# Patient Record
Sex: Female | Born: 1955 | Race: White | Hispanic: No | Marital: Married | Smoking: Never smoker
Health system: Southern US, Community
[De-identification: ages and names within clinical notes are randomized; demographics above are authoritative.]

## PROBLEM LIST (undated history)

## (undated) DIAGNOSIS — F419 Anxiety disorder, unspecified: Secondary | ICD-10-CM

## (undated) DIAGNOSIS — M1990 Unspecified osteoarthritis, unspecified site: Secondary | ICD-10-CM

## (undated) DIAGNOSIS — F32A Depression, unspecified: Secondary | ICD-10-CM

## (undated) DIAGNOSIS — G4700 Insomnia, unspecified: Secondary | ICD-10-CM

## (undated) DIAGNOSIS — J45909 Unspecified asthma, uncomplicated: Secondary | ICD-10-CM

## (undated) DIAGNOSIS — T7840XA Allergy, unspecified, initial encounter: Secondary | ICD-10-CM

## (undated) DIAGNOSIS — H269 Unspecified cataract: Secondary | ICD-10-CM

## (undated) DIAGNOSIS — K219 Gastro-esophageal reflux disease without esophagitis: Secondary | ICD-10-CM

## (undated) DIAGNOSIS — F329 Major depressive disorder, single episode, unspecified: Secondary | ICD-10-CM

## (undated) DIAGNOSIS — R011 Cardiac murmur, unspecified: Secondary | ICD-10-CM

## (undated) DIAGNOSIS — E785 Hyperlipidemia, unspecified: Secondary | ICD-10-CM

## (undated) DIAGNOSIS — IMO0002 Reserved for concepts with insufficient information to code with codable children: Secondary | ICD-10-CM

## (undated) HISTORY — DX: Unspecified osteoarthritis, unspecified site: M19.90

## (undated) HISTORY — DX: Anxiety disorder, unspecified: F41.9

## (undated) HISTORY — DX: Unspecified asthma, uncomplicated: J45.909

## (undated) HISTORY — PX: KNEE ARTHROSCOPY: SUR90

## (undated) HISTORY — DX: Gastro-esophageal reflux disease without esophagitis: K21.9

## (undated) HISTORY — DX: Depression, unspecified: F32.A

## (undated) HISTORY — DX: Hyperlipidemia, unspecified: E78.5

## (undated) HISTORY — PX: PARTIAL HYSTERECTOMY: SHX80

## (undated) HISTORY — DX: Cardiac murmur, unspecified: R01.1

## (undated) HISTORY — PX: ANKLE FRACTURE SURGERY: SHX122

## (undated) HISTORY — DX: Insomnia, unspecified: G47.00

## (undated) HISTORY — PX: CARPAL TUNNEL RELEASE: SHX101

## (undated) HISTORY — PX: FRACTURE SURGERY: SHX138

## (undated) HISTORY — PX: ABDOMINAL HYSTERECTOMY: SHX81

## (undated) HISTORY — PX: COLONOSCOPY: SHX174

## (undated) HISTORY — PX: BREAST LUMPECTOMY: SHX2

## (undated) HISTORY — DX: Reserved for concepts with insufficient information to code with codable children: IMO0002

## (undated) HISTORY — DX: Allergy, unspecified, initial encounter: T78.40XA

## (undated) HISTORY — DX: Unspecified cataract: H26.9

---
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
## 1999-07-22 ENCOUNTER — Other Ambulatory Visit: Admission: RE | Admit: 1999-07-22 | Discharge: 1999-07-22 | Payer: Self-pay | Admitting: Family Medicine

## 1999-09-14 ENCOUNTER — Ambulatory Visit (HOSPITAL_BASED_OUTPATIENT_CLINIC_OR_DEPARTMENT_OTHER): Admission: RE | Admit: 1999-09-14 | Discharge: 1999-09-14 | Payer: Self-pay | Admitting: *Deleted

## 2000-07-23 ENCOUNTER — Other Ambulatory Visit: Admission: RE | Admit: 2000-07-23 | Discharge: 2000-07-23 | Payer: Self-pay | Admitting: Family Medicine

## 2012-06-24 ENCOUNTER — Other Ambulatory Visit: Payer: Self-pay | Admitting: Family Medicine

## 2012-06-25 ENCOUNTER — Other Ambulatory Visit: Payer: Self-pay

## 2012-06-25 MED ORDER — ZOLPIDEM TARTRATE 10 MG PO TABS: 10.0000 mg | ORAL_TABLET | Freq: Every evening | ORAL | Status: DC | PRN

## 2012-06-25 NOTE — Telephone Encounter (Signed)
RX called to [facility]. 

## 2012-06-25 NOTE — Telephone Encounter (Signed)
Please call in Rx for ambien with one refill

## 2012-06-25 NOTE — Telephone Encounter (Signed)
Last seen 05/06/12  Last written 04/08/12 with 2 RF's

## 2012-08-02 ENCOUNTER — Telehealth: Payer: Self-pay | Admitting: Nurse Practitioner

## 2012-08-02 MED ORDER — FLUCONAZOLE 150 MG PO TABS: ORAL_TABLET | ORAL | Status: DC

## 2012-08-02 NOTE — Telephone Encounter (Signed)
Diflucan rx sent to pharmacy.

## 2012-08-02 NOTE — Telephone Encounter (Signed)
Pt aware.

## 2012-08-13 ENCOUNTER — Other Ambulatory Visit: Payer: Self-pay | Admitting: Family Medicine

## 2012-08-14 ENCOUNTER — Other Ambulatory Visit: Payer: Self-pay | Admitting: Family Medicine

## 2012-08-21 ENCOUNTER — Ambulatory Visit: Payer: Self-pay | Admitting: Nurse Practitioner

## 2012-08-23 ENCOUNTER — Encounter: Payer: Self-pay | Admitting: Nurse Practitioner

## 2012-08-23 ENCOUNTER — Ambulatory Visit (INDEPENDENT_AMBULATORY_CARE_PROVIDER_SITE_OTHER): Payer: 59 | Admitting: Nurse Practitioner

## 2012-08-23 VITALS — BP 113/74 | HR 74 | Temp 97.9°F | Ht 66.0 in | Wt 194.0 lb

## 2012-08-23 DIAGNOSIS — Z01419 Encounter for gynecological examination (general) (routine) without abnormal findings: Secondary | ICD-10-CM

## 2012-08-23 DIAGNOSIS — Z0000 Encounter for general adult medical examination without abnormal findings: Secondary | ICD-10-CM

## 2012-08-23 DIAGNOSIS — F32A Depression, unspecified: Secondary | ICD-10-CM

## 2012-08-23 DIAGNOSIS — E785 Hyperlipidemia, unspecified: Secondary | ICD-10-CM

## 2012-08-23 DIAGNOSIS — G4700 Insomnia, unspecified: Secondary | ICD-10-CM | POA: Insufficient documentation

## 2012-08-23 DIAGNOSIS — Z124 Encounter for screening for malignant neoplasm of cervix: Secondary | ICD-10-CM

## 2012-08-23 DIAGNOSIS — K219 Gastro-esophageal reflux disease without esophagitis: Secondary | ICD-10-CM | POA: Insufficient documentation

## 2012-08-23 DIAGNOSIS — F329 Major depressive disorder, single episode, unspecified: Secondary | ICD-10-CM | POA: Insufficient documentation

## 2012-08-23 DIAGNOSIS — F411 Generalized anxiety disorder: Secondary | ICD-10-CM

## 2012-08-23 LAB — COMPLETE METABOLIC PANEL WITH GFR
AST: 17 U/L (ref 0–37)
Albumin: 4.2 g/dL (ref 3.5–5.2)
Alkaline Phosphatase: 61 U/L (ref 39–117)
BUN: 15 mg/dL (ref 6–23)
GFR, Est Non African American: 79 mL/min
Glucose, Bld: 90 mg/dL (ref 70–99)
Potassium: 4.5 mEq/L (ref 3.5–5.3)
Sodium: 138 mEq/L (ref 135–145)
Total Bilirubin: 0.4 mg/dL (ref 0.3–1.2)
Total Protein: 6.9 g/dL (ref 6.0–8.3)

## 2012-08-23 LAB — POCT URINALYSIS DIPSTICK
Bilirubin, UA: NEGATIVE
Ketones, UA: NEGATIVE
Leukocytes, UA: NEGATIVE
pH, UA: 6

## 2012-08-23 LAB — POCT UA - MICROSCOPIC ONLY: WBC number, urine, microscopy: NEGATIVE

## 2012-08-23 LAB — THYROID PANEL WITH TSH
Free Thyroxine Index: 2.5 (ref 1.0–3.9)
T3 Uptake: 34.1 % (ref 22.5–37.0)
T4, Total: 7.3 ug/dL (ref 5.0–12.5)

## 2012-08-23 LAB — POCT CBC
Granulocyte percent: 68.6 %G (ref 37–80)
HCT, POC: 37.9 % (ref 37.7–47.9)
Hemoglobin: 13.5 g/dL (ref 12.2–16.2)
MCH, POC: 31 pg (ref 27–31.2)
MCV: 87.2 fL (ref 80–97)
Platelet Count, POC: 227 10*3/uL (ref 142–424)
RBC: 4.4 M/uL (ref 4.04–5.48)

## 2012-08-23 MED ORDER — LORAZEPAM 1 MG PO TABS: 1.0000 mg | ORAL_TABLET | Freq: Three times a day (TID) | ORAL | Status: DC

## 2012-08-23 MED ORDER — ROSUVASTATIN CALCIUM 40 MG PO TABS: 40.0000 mg | ORAL_TABLET | Freq: Every day | ORAL | Status: DC

## 2012-08-23 MED ORDER — CITALOPRAM HYDROBROMIDE 40 MG PO TABS: 40.0000 mg | ORAL_TABLET | Freq: Every day | ORAL | Status: DC

## 2012-08-23 NOTE — Progress Notes (Signed)
Subjective:    Patient ID: Crystal Wong, female    DOB: October 28, 1955, 57 y.o.   MRN: [id number]  HPI Patient in today for CPE and PAP- SHe is doing well- she has multiple medical problems without complaints. Patient Active Problem List   Diagnosis Date Noted  . Hyperlipidemia 08/23/2012  . Insomnia 08/23/2012  . GERD (gastroesophageal reflux disease) 08/23/2012  . Depression 08/23/2012  . GAD (generalized anxiety disorder) 08/23/2012   Outpatient Encounter Prescriptions as of 08/23/2012  Medication Sig Dispense Refill  . albuterol (PROVENTIL HFA;VENTOLIN HFA) 108 (90 BASE) MCG/ACT inhaler Inhale 2 puffs into the lungs every 6 (six) hours as needed for wheezing.      . budesonide-formoterol (SYMBICORT) 80-4.5 MCG/ACT inhaler Inhale 2 puffs into the lungs 2 (two) times daily.      . citalopram (CELEXA) 40 MG tablet TAKE ONE TABLET BY MOUTH ONE TIME DAILY  30 tablet  1  . CRESTOR 40 MG tablet TAKE ONE TABLET BY MOUTH ONE TIME DAILY  30 tablet  1  . LORazepam (ATIVAN) 1 MG tablet Take 1 mg by mouth every 8 (eight) hours.      . ranitidine (ZANTAC) 150 MG tablet Take 150 mg by mouth 2 (two) times daily.      . [DISCONTINUED] fluconazole (DIFLUCAN) 150 MG tablet 1 PO Now and repeat in 1 week  2 tablet  0  . zolpidem (AMBIEN) 10 MG tablet Take 1 tablet (10 mg total) by mouth at bedtime as needed for sleep.  30 tablet  1  . [DISCONTINUED] CRESTOR 40 MG tablet TAKE ONE TABLET BY MOUTH AT BEDTIME  30 tablet  1   No facility-administered encounter medications on file as of 08/23/2012.       Review of Systems  Constitutional: Negative.   HENT: Negative.   Eyes: Negative.   Respiratory: Negative.   Cardiovascular: Negative.   Gastrointestinal: Negative.   Endocrine: Negative.   Genitourinary: Negative.   Musculoskeletal: Negative.   Allergic/Immunologic: Negative.   Neurological: Negative.   Hematological: Negative.   Psychiatric/Behavioral: Negative.        Objective:   Physical  Exam  Constitutional: She is oriented to person, place, and time. She appears well-developed and well-nourished.  HENT:  Head: Normocephalic.  Right Ear: Hearing, tympanic membrane, external ear and ear canal normal.  Left Ear: Hearing, tympanic membrane, external ear and ear canal normal.  Nose: Nose normal.  Mouth/Throat: Uvula is midline and oropharynx is clear and moist.  Eyes: Conjunctivae and EOM are normal. Pupils are equal, round, and reactive to light.  Neck: Normal range of motion and full passive range of motion without pain. Neck supple. No JVD present. Carotid bruit is not present. No mass and no thyromegaly present.  Cardiovascular: Normal rate, normal heart sounds and intact distal pulses.   No murmur heard. Pulmonary/Chest: Effort normal and breath sounds normal. Right breast exhibits no inverted nipple, no mass, no nipple discharge, no skin change and no tenderness. Left breast exhibits no inverted nipple, no mass, no nipple discharge, no skin change and no tenderness.  Abdominal: Soft. Bowel sounds are normal. She exhibits no mass. There is no tenderness.  Genitourinary: Vagina normal and uterus normal. No breast swelling, tenderness, discharge or bleeding.  bimanual exam-No adnexal masses or tenderness.  Vaginal cuff intact  Musculoskeletal: Normal range of motion.  Lymphadenopathy:    She has no cervical adenopathy.  Neurological: She is alert and oriented to person, place, and time.  Skin: Skin  is warm and dry.  Psychiatric: She has a normal mood and affect. Her behavior is normal. Judgment and thought content normal.   BP 113/74  Pulse 74  Temp(Src) 97.9 F (36.6 C) (Oral)  Ht 5\' 6"  (1.676 m)  Wt 194 lb (87.998 kg)  BMI 31.33 kg/m2        Assessment & Plan:   1. Encounter for routine gynecological examination   2. Hyperlipidemia   3. Insomnia   4. GERD (gastroesophageal reflux disease)   5. Depression   6. GAD (generalized anxiety disorder)   7.  Annual physical exam    Orders Placed This Encounter  Procedures  . COMPLETE METABOLIC PANEL WITH GFR  . NMR Lipoprofile with Lipids  . Thyroid Panel With TSH  . POCT UA - Microscopic Only  . POCT urinalysis dipstick  . POCT CBC   Meds ordered this encounter  Medications  . DISCONTD: LORazepam (ATIVAN) 1 MG tablet    Sig: Take 1 mg by mouth every 8 (eight) hours.  . budesonide-formoterol (SYMBICORT) 80-4.5 MCG/ACT inhaler    Sig: Inhale 2 puffs into the lungs 2 (two) times daily.  Marland Kitchen albuterol (PROVENTIL HFA;VENTOLIN HFA) 108 (90 BASE) MCG/ACT inhaler    Sig: Inhale 2 puffs into the lungs every 6 (six) hours as needed for wheezing.  . ranitidine (ZANTAC) 150 MG tablet    Sig: Take 150 mg by mouth 2 (two) times daily.  . citalopram (CELEXA) 40 MG tablet    Sig: Take 1 tablet (40 mg total) by mouth daily.    Dispense:  30 tablet    Refill:  5    Order Specific Question:  Supervising Provider    Answer:  Ernestina Penna [[id number]]  . rosuvastatin (CRESTOR) 40 MG tablet    Sig: Take 1 tablet (40 mg total) by mouth daily.    Dispense:  30 tablet    Refill:  5    Order Specific Question:  Supervising Provider    Answer:  Ernestina Penna [[id number]]  . LORazepam (ATIVAN) 1 MG tablet    Sig: Take 1 tablet (1 mg total) by mouth every 8 (eight) hours.    Dispense:  30 tablet    Refill:  2    Order Specific Question:  Supervising Provider    Answer:  Deborra Medina   Continue all meds Labs pending Diet and exercise encouraged Follow- up in 3 months  [name], FNP

## 2012-08-23 NOTE — Patient Instructions (Signed)

## 2012-08-24 ENCOUNTER — Encounter: Payer: Self-pay | Admitting: Nurse Practitioner

## 2012-08-26 LAB — NMR LIPOPROFILE WITH LIPIDS
Cholesterol, Total: 225 mg/dL — ABNORMAL HIGH (ref <200)
HDL Size: 9 nm — ABNORMAL LOW (ref >=9.2)
HDL-C: 59 mg/dL (ref >=40)
LDL Particle Number: 2041 nmol/L — ABNORMAL HIGH (ref <1000)
LP-IR Score: <25 (ref <=45)
Large VLDL-P: <0.8 nmol/L (ref <=2.7)
Triglycerides: 51 mg/dL (ref <150)
VLDL Size: 35.6 nm (ref <=46.6)

## 2012-08-26 LAB — PAP IG W/ RFLX HPV ASCU

## 2012-08-28 ENCOUNTER — Telehealth: Payer: Self-pay | Admitting: Nurse Practitioner

## 2012-08-29 NOTE — Telephone Encounter (Signed)
Done

## 2012-09-24 ENCOUNTER — Other Ambulatory Visit: Payer: Self-pay | Admitting: *Deleted

## 2012-09-24 NOTE — Telephone Encounter (Signed)
Patient of MMM. Patient last seen in office on 6-20. Rx last filled on 08-13-12. Please advise. If approved please have nurse phone in to [facility] pharmacy

## 2012-09-25 MED ORDER — ZOLPIDEM TARTRATE 10 MG PO TABS: 10.0000 mg | ORAL_TABLET | Freq: Every evening | ORAL | Status: DC | PRN

## 2012-09-25 NOTE — Telephone Encounter (Signed)
Call in Please

## 2012-09-25 NOTE — Telephone Encounter (Signed)
Med called to pharm 

## 2012-10-15 ENCOUNTER — Other Ambulatory Visit: Payer: Self-pay | Admitting: Nurse Practitioner

## 2012-11-15 ENCOUNTER — Other Ambulatory Visit: Payer: Self-pay | Admitting: *Deleted

## 2012-11-16 MED ORDER — ZOLPIDEM TARTRATE 10 MG PO TABS: 10.0000 mg | ORAL_TABLET | Freq: Every evening | ORAL | Status: DC | PRN

## 2012-11-16 NOTE — Telephone Encounter (Signed)
Please callin rx for ambien with 1 refill

## 2012-11-19 NOTE — Telephone Encounter (Signed)
Called in.

## 2012-12-19 ENCOUNTER — Ambulatory Visit (INDEPENDENT_AMBULATORY_CARE_PROVIDER_SITE_OTHER): Payer: 59 | Admitting: Nurse Practitioner

## 2012-12-19 ENCOUNTER — Encounter: Payer: Self-pay | Admitting: Nurse Practitioner

## 2012-12-19 VITALS — BP 123/80 | HR 83 | Temp 99.4°F | Ht 66.0 in | Wt 198.0 lb

## 2012-12-19 DIAGNOSIS — J029 Acute pharyngitis, unspecified: Secondary | ICD-10-CM

## 2012-12-19 LAB — POCT RAPID STREP A (OFFICE): Rapid Strep A Screen: NEGATIVE

## 2012-12-19 MED ORDER — AMOXICILLIN 875 MG PO TABS: 875.0000 mg | ORAL_TABLET | Freq: Two times a day (BID) | ORAL | Status: DC

## 2012-12-19 MED ORDER — METHYLPREDNISOLONE ACETATE 80 MG/ML IJ SUSP
80.0000 mg | Freq: Once | INTRAMUSCULAR | Status: AC
  Administered 2012-12-19: 80 mg via INTRAMUSCULAR

## 2012-12-19 NOTE — Progress Notes (Signed)
  Subjective:    Patient ID: [name], female    DOB: 30-May-1955, 57 y.o.   MRN: [id number]  HPI Patient here  Today c/o cough, congestion and sore throat. Started about 3 weeks ago.    Review of Systems  Constitutional: Positive for fever (low grade) and chills.  HENT: Positive for ear pain, postnasal drip, rhinorrhea, sinus pressure, sore throat and trouble swallowing.   Respiratory: Positive for cough.   Cardiovascular: Negative.   Gastrointestinal: Negative.        Objective:   Physical Exam  Constitutional: She appears well-developed and well-nourished.  HENT:  Right Ear: Hearing, external ear and ear canal normal. A middle ear effusion is present.  Left Ear: Hearing, external ear and ear canal normal. A middle ear effusion is present.  Nose: Mucosal edema and rhinorrhea present. Right sinus exhibits no maxillary sinus tenderness and no frontal sinus tenderness. Left sinus exhibits no maxillary sinus tenderness and no frontal sinus tenderness.  Mouth/Throat: Uvula is midline, oropharynx is clear and moist and mucous membranes are normal.  Cardiovascular: Normal rate, regular rhythm and normal heart sounds.   Pulmonary/Chest: Effort normal and breath sounds normal.  Skin: Skin is warm.    BP 123/80  Pulse 83  Temp(Src) 99.4 F (37.4 C) (Oral)  Ht 5\' 6"  (1.676 m)  Wt 198 lb (89.812 kg)  BMI 31.97 kg/m2 Results for orders placed in visit on 12/19/12  POCT RAPID STREP A (OFFICE)      Result Value Range   Rapid Strep A Screen Negative  Negative         Assessment & Plan:   1. Sore throat   2. Acute pharyngitis    Meds ordered this encounter  Medications  . amoxicillin (AMOXIL) 875 MG tablet    Sig: Take 1 tablet (875 mg total) by mouth 2 (two) times daily.    Dispense:  20 tablet    Refill:  0    Order Specific Question:  Supervising Provider    Answer:  Ernestina Penna [[id number]]  . methylPREDNISolone acetate (DEPO-MEDROL) injection 80 mg    Sig:    1.  Take meds as prescribed 2. Use a cool mist humidifier especially during the winter months and when heat has  been humid. 3. Use saline nose sprays frequently 4. Saline irrigations of the nose can be very helpful if done frequently.  * 4X daily for 1 week*  * Use of a nettie pot can be helpful with this. Follow directions with this* 5. Drink plenty of fluids 6. Keep thermostat turn down low 7.For any cough or congestion  Use plain Mucinex- regular strength or max strength is fine   * Children- consult with Pharmacist for dosing 8. For fever or aces or pains- take tylenol or ibuprofen appropriate for age and weight.  * for fevers greater than 101 orally you may alternate ibuprofen and tylenol every  3 hours.   [name], FNP

## 2012-12-19 NOTE — Patient Instructions (Signed)

## 2012-12-20 ENCOUNTER — Other Ambulatory Visit: Payer: Self-pay | Admitting: Nurse Practitioner

## 2012-12-23 ENCOUNTER — Ambulatory Visit: Payer: 59

## 2012-12-23 NOTE — Telephone Encounter (Signed)
Last seen 12/19/12

## 2012-12-30 ENCOUNTER — Ambulatory Visit (INDEPENDENT_AMBULATORY_CARE_PROVIDER_SITE_OTHER): Payer: 59

## 2012-12-30 DIAGNOSIS — Z23 Encounter for immunization: Secondary | ICD-10-CM

## 2013-01-28 ENCOUNTER — Telehealth: Payer: Self-pay | Admitting: Nurse Practitioner

## 2013-01-28 MED ORDER — AZITHROMYCIN 250 MG PO TABS: ORAL_TABLET | ORAL | Status: DC

## 2013-01-28 NOTE — Telephone Encounter (Signed)
rx sent to pharmacy

## 2013-02-04 ENCOUNTER — Other Ambulatory Visit: Payer: Self-pay

## 2013-02-04 DIAGNOSIS — F411 Generalized anxiety disorder: Secondary | ICD-10-CM

## 2013-02-04 MED ORDER — LORAZEPAM 1 MG PO TABS: 1.0000 mg | ORAL_TABLET | Freq: Three times a day (TID) | ORAL | Status: DC

## 2013-02-04 NOTE — Telephone Encounter (Signed)
Rx called into [facility] 

## 2013-02-04 NOTE — Telephone Encounter (Signed)
Please call in ativan rx 

## 2013-02-04 NOTE — Telephone Encounter (Signed)
Last seen 12/19/12  MMM if approved route to nurse to phone into [facility]

## 2013-02-05 ENCOUNTER — Telehealth: Payer: Self-pay | Admitting: Nurse Practitioner

## 2013-02-05 ENCOUNTER — Encounter: Payer: Self-pay | Admitting: General Practice

## 2013-02-05 ENCOUNTER — Ambulatory Visit (INDEPENDENT_AMBULATORY_CARE_PROVIDER_SITE_OTHER): Payer: 59 | Admitting: General Practice

## 2013-02-05 VITALS — BP 123/79 | HR 79 | Temp 98.4°F | Ht 66.0 in | Wt 193.5 lb

## 2013-02-05 DIAGNOSIS — J209 Acute bronchitis, unspecified: Secondary | ICD-10-CM

## 2013-02-05 DIAGNOSIS — J069 Acute upper respiratory infection, unspecified: Secondary | ICD-10-CM

## 2013-02-05 DIAGNOSIS — B373 Candidiasis of vulva and vagina: Secondary | ICD-10-CM

## 2013-02-05 MED ORDER — PREDNISONE (PAK) 10 MG PO TABS: ORAL_TABLET | ORAL | Status: DC

## 2013-02-05 MED ORDER — FLUCONAZOLE 150 MG PO TABS: 150.0000 mg | ORAL_TABLET | Freq: Once | ORAL | Status: DC

## 2013-02-05 MED ORDER — AMOXICILLIN-POT CLAVULANATE 875-125 MG PO TABS: 1.0000 | ORAL_TABLET | Freq: Two times a day (BID) | ORAL | Status: DC

## 2013-02-05 NOTE — Patient Instructions (Addendum)
Bronchitis Bronchitis is the body's way of reacting to injury and/or infection (inflammation) of the bronchi. Bronchi are the air tubes that extend from the windpipe into the lungs. If the inflammation becomes severe, it may cause shortness of breath. CAUSES  Inflammation may be caused by:  A virus.  Germs (bacteria).  Dust.  Allergens.  Pollutants and many other irritants. The cells lining the bronchial tree are covered with tiny hairs (cilia). These constantly beat upward, away from the lungs, toward the mouth. This keeps the lungs free of pollutants. When these cells become too irritated and are unable to do their job, mucus begins to develop. This causes the characteristic cough of bronchitis. The cough clears the lungs when the cilia are unable to do their job. Without either of these protective mechanisms, the mucus would settle in the lungs. Then you would develop pneumonia. Smoking is a common cause of bronchitis and can contribute to pneumonia. Stopping this habit is the single most important thing you can do to help yourself. TREATMENT   Your caregiver may prescribe an antibiotic if the cough is caused by bacteria. Also, medicines that open up your airways make it easier to breathe. Your caregiver may also recommend or prescribe an expectorant. It will loosen the mucus to be coughed up. Only take over-the-counter or prescription medicines for pain, discomfort, or fever as directed by your caregiver.  Removing whatever causes the problem (smoking, for example) is critical to preventing the problem from getting worse.  Cough suppressants may be prescribed for relief of cough symptoms.  Inhaled medicines may be prescribed to help with symptoms now and to help prevent problems from returning.  For those with recurrent (chronic) bronchitis, there may be a need for steroid medicines. SEEK IMMEDIATE MEDICAL CARE IF:   During treatment, you develop more pus-like mucus (purulent  sputum).  You have a fever.  You become progressively more ill.  You have increased difficulty breathing, wheezing, or shortness of breath. It is necessary to seek immediate medical care if you are elderly or sick from any other disease. MAKE SURE YOU:   Understand these instructions.  Will watch your condition.  Will get help right away if you are not doing well or get worse. Document Released: 02/20/2005 Document Revised: 10/23/2012 Document Reviewed: 10/15/2012 ExitCare Patient Information 2014 [employer].  Upper Respiratory Infection, Adult An upper respiratory infection (URI) is also sometimes known as the common cold. The upper respiratory tract includes the nose, sinuses, throat, trachea, and bronchi. Bronchi are the airways leading to the lungs. Most people improve within 1 week, but symptoms can last up to 2 weeks. A residual cough may last even longer.  CAUSES Many different viruses can infect the tissues lining the upper respiratory tract. The tissues become irritated and inflamed and often become very moist. Mucus production is also common. A cold is contagious. You can easily spread the virus to others by oral contact. This includes kissing, sharing a glass, coughing, or sneezing. Touching your mouth or nose and then touching a surface, which is then touched by another person, can also spread the virus. SYMPTOMS  Symptoms typically develop 1 to 3 days after you come in contact with a cold virus. Symptoms vary from person to person. They may include:  Runny nose.  Sneezing.  Nasal congestion.  Sinus irritation.  Sore throat.  Loss of voice (laryngitis).  Cough.  Fatigue.  Muscle aches.  Loss of appetite.  Headache.  Low-grade fever. DIAGNOSIS  You   might diagnose your own cold based on familiar symptoms, since most people get a cold 2 to 3 times a year. Your caregiver can confirm this based on your exam. Most importantly, your caregiver can check that  your symptoms are not due to another disease such as strep throat, sinusitis, pneumonia, asthma, or epiglottitis. Blood tests, throat tests, and X-rays are not necessary to diagnose a common cold, but they may sometimes be helpful in excluding other more serious diseases. Your caregiver will decide if any further tests are required. RISKS AND COMPLICATIONS  You may be at risk for a more severe case of the common cold if you smoke cigarettes, have chronic heart disease (such as heart failure) or lung disease (such as asthma), or if you have a weakened immune system. The very young and very old are also at risk for more serious infections. Bacterial sinusitis, middle ear infections, and bacterial pneumonia can complicate the common cold. The common cold can worsen asthma and chronic obstructive pulmonary disease (COPD). Sometimes, these complications can require emergency medical care and may be life-threatening. PREVENTION  The best way to protect against getting a cold is to practice good hygiene. Avoid oral or hand contact with people with cold symptoms. Wash your hands often if contact occurs. There is no clear evidence that vitamin C, vitamin E, echinacea, or exercise reduces the chance of developing a cold. However, it is always recommended to get plenty of rest and practice good nutrition. TREATMENT  Treatment is directed at relieving symptoms. There is no cure. Antibiotics are not effective, because the infection is caused by a virus, not by bacteria. Treatment may include:  Increased fluid intake. Sports drinks offer valuable electrolytes, sugars, and fluids.  Breathing heated mist or steam (vaporizer or shower).  Eating chicken soup or other clear broths, and maintaining good nutrition.  Getting plenty of rest.  Using gargles or lozenges for comfort.  Controlling fevers with ibuprofen or acetaminophen as directed by your caregiver.  Increasing usage of your inhaler if you have  asthma. Zinc gel and zinc lozenges, taken in the first 24 hours of the common cold, can shorten the duration and lessen the severity of symptoms. Pain medicines may help with fever, muscle aches, and throat pain. A variety of non-prescription medicines are available to treat congestion and runny nose. Your caregiver can make recommendations and may suggest nasal or lung inhalers for other symptoms.  HOME CARE INSTRUCTIONS   Only take over-the-counter or prescription medicines for pain, discomfort, or fever as directed by your caregiver.  Use a warm mist humidifier or inhale steam from a shower to increase air moisture. This may keep secretions moist and make it easier to breathe.  Drink enough water and fluids to keep your urine clear or pale yellow.  Rest as needed.  Return to work when your temperature has returned to normal or as your caregiver advises. You may need to stay home longer to avoid infecting others. You can also use a face mask and careful hand washing to prevent spread of the virus. SEEK MEDICAL CARE IF:   After the first few days, you feel you are getting worse rather than better.  You need your caregiver's advice about medicines to control symptoms.  You develop chills, worsening shortness of breath, or brown or red sputum. These may be signs of pneumonia.  You develop yellow or brown nasal discharge or pain in the face, especially when you bend forward. These may be signs of sinusitis.    You develop a fever, swollen neck glands, pain with swallowing, or white areas in the back of your throat. These may be signs of strep throat. SEEK IMMEDIATE MEDICAL CARE IF:   You have a fever.  You develop severe or persistent headache, ear pain, sinus pain, or chest pain.  You develop wheezing, a prolonged cough, cough up blood, or have a change in your usual mucus (if you have chronic lung disease).  You develop sore muscles or a stiff neck. Document Released: 08/16/2000  Document Revised: 05/15/2011 Document Reviewed: 06/24/2010 ExitCare Patient Information 2014 [employer].  

## 2013-02-05 NOTE — Telephone Encounter (Signed)
appt with Crystal Wong at 9:40

## 2013-02-05 NOTE — Progress Notes (Signed)
   Subjective:    Patient ID: Crystal Wong, female    DOB: 01-Mar-1956, 57 y.o.   MRN: [id number]  Cough This is a new problem. The current episode started 1 to 4 weeks ago (onset 2 weeks ago). The problem has been gradually worsening. The problem occurs every few minutes. The cough is non-productive. Associated symptoms include postnasal drip, shortness of breath and wheezing. Pertinent negatives include no chills, ear congestion, fever, headaches or sore throat. The symptoms are aggravated by lying down. She has tried steroid inhaler for the symptoms. Her past medical history is significant for asthma and bronchitis. There is no history of pneumonia.      Review of Systems  Constitutional: Negative for fever and chills.  HENT: Positive for postnasal drip. Negative for sinus pressure and sore throat.   Respiratory: Positive for cough, shortness of breath and wheezing. Negative for chest tightness.   Neurological: Negative for dizziness, weakness and headaches.       Objective:   Physical Exam  Constitutional: She is oriented to person, place, and time. She appears well-developed and well-nourished.  Cardiovascular: Normal rate, regular rhythm and normal heart sounds.   Pulmonary/Chest: Effort normal. No respiratory distress. She has wheezes in the right upper field and the left upper field. She exhibits no tenderness.  Bronchial cough and tight breath sounds throughout  Neurological: She is alert and oriented to person, place, and time.  Skin: Skin is warm and dry.  Psychiatric: She has a normal mood and affect.          Assessment & Plan:  1. Upper respiratory infection  - amoxicillin-clavulanate (AUGMENTIN) 875-125 MG per tablet; Take 1 tablet by mouth 2 (two) times daily.  Dispense: 20 tablet; Refill: 0  2. Acute bronchitis  - predniSONE (STERAPRED UNI-PAK) 10 MG tablet; Take as directed  Dispense: 21 tablet; Refill: 0 -adequate fluids -RTO if symptoms worsen or  unresolved -Patient verbalized understanding Coralie Keens, FNP-C

## 2013-02-10 ENCOUNTER — Other Ambulatory Visit: Payer: Self-pay | Admitting: Nurse Practitioner

## 2013-02-19 ENCOUNTER — Telehealth: Payer: Self-pay | Admitting: *Deleted

## 2013-02-19 NOTE — Telephone Encounter (Signed)
Ins co will not cover  symbicort unless  Pt has tried and failed  advair and  Dulera. [place]bour said she had tried advair in the past but not dulera.  I told her you might give her a sample of dulera, that we will let her know.  She is very willing to try something else.  They said if the dulera and advair did not work submit symbicort again and they would cover.  Can you handle this for me?  Thanks!

## 2013-02-24 ENCOUNTER — Other Ambulatory Visit: Payer: Self-pay | Admitting: Nurse Practitioner

## 2013-02-24 NOTE — Telephone Encounter (Signed)
Mae said to try [facility] 167mcg/5mcg 2 puffs bid and to let us know if it helps or if you have any unusual side effects.. Samples up front and husband notified and will pick up.

## 2013-02-25 ENCOUNTER — Other Ambulatory Visit: Payer: Self-pay

## 2013-02-25 NOTE — Telephone Encounter (Signed)
Last seen 02/05/13  Mae  If approved route to nurse to call into  [facility] 

## 2013-02-26 MED ORDER — ZOLPIDEM TARTRATE 10 MG PO TABS: 10.0000 mg | ORAL_TABLET | Freq: Every evening | ORAL | Status: DC | PRN

## 2013-02-26 NOTE — Telephone Encounter (Signed)
Ambien called to [facility] and pt aware.

## 2013-02-26 NOTE — Telephone Encounter (Signed)
Please phone in

## 2013-03-07 ENCOUNTER — Other Ambulatory Visit: Payer: Self-pay | Admitting: Family Medicine

## 2013-03-07 DIAGNOSIS — Z20828 Contact with and (suspected) exposure to other viral communicable diseases: Secondary | ICD-10-CM

## 2013-03-07 MED ORDER — OSELTAMIVIR PHOSPHATE 75 MG PO CAPS: 75.0000 mg | ORAL_CAPSULE | Freq: Every day | ORAL | Status: DC

## 2013-03-31 ENCOUNTER — Other Ambulatory Visit: Payer: Self-pay | Admitting: *Deleted

## 2013-03-31 ENCOUNTER — Other Ambulatory Visit: Payer: Self-pay | Admitting: General Practice

## 2013-03-31 DIAGNOSIS — F411 Generalized anxiety disorder: Secondary | ICD-10-CM

## 2013-03-31 NOTE — Telephone Encounter (Signed)
Patient last seen in office on 02-05-13. Rx last filled on 02-04-13. Please advise. If approved please route to Pool B so nurse can phone in to [place]

## 2013-04-02 ENCOUNTER — Other Ambulatory Visit: Payer: Self-pay | Admitting: General Practice

## 2013-04-07 ENCOUNTER — Telehealth: Payer: Self-pay | Admitting: Nurse Practitioner

## 2013-04-07 ENCOUNTER — Other Ambulatory Visit: Payer: Self-pay

## 2013-04-07 DIAGNOSIS — F411 Generalized anxiety disorder: Secondary | ICD-10-CM

## 2013-04-07 NOTE — Telephone Encounter (Signed)
Last seen 02/05/13  Crystal Wong  If approved route to nurse to call into [place]

## 2013-04-08 MED ORDER — LORAZEPAM 1 MG PO TABS: 1.0000 mg | ORAL_TABLET | Freq: Three times a day (TID) | ORAL | Status: DC

## 2013-04-08 NOTE — Telephone Encounter (Signed)
patient aware called into pharmacy

## 2013-04-08 NOTE — Telephone Encounter (Signed)
Please call in ativan 1mg  prn #30 1 refill

## 2013-04-11 MED ORDER — LORAZEPAM 1 MG PO TABS: 1.0000 mg | ORAL_TABLET | Freq: Two times a day (BID) | ORAL | Status: DC | PRN

## 2013-04-11 NOTE — Telephone Encounter (Signed)
Called to [facility] 

## 2013-04-11 NOTE — Telephone Encounter (Signed)
Please phone in

## 2013-04-21 ENCOUNTER — Other Ambulatory Visit: Payer: Self-pay | Admitting: Nurse Practitioner

## 2013-04-22 NOTE — Telephone Encounter (Signed)
Last seen 06/14

## 2013-04-23 NOTE — Telephone Encounter (Signed)
ntbs

## 2013-05-27 ENCOUNTER — Other Ambulatory Visit: Payer: Self-pay | Admitting: General Practice

## 2013-06-09 ENCOUNTER — Other Ambulatory Visit: Payer: Self-pay | Admitting: General Practice

## 2013-06-10 NOTE — Telephone Encounter (Signed)
Patient last seen i noffice on 02-05-13 for an acute visit. Rx last filled on 04-19-13. Please advise. If approved please route to Pool B so nurse can phone in to pharmacy

## 2013-06-11 ENCOUNTER — Other Ambulatory Visit: Payer: Self-pay | Admitting: General Practice

## 2013-06-11 DIAGNOSIS — G4700 Insomnia, unspecified: Secondary | ICD-10-CM

## 2013-06-11 MED ORDER — ZOLPIDEM TARTRATE 10 MG PO TABS: 10.0000 mg | ORAL_TABLET | Freq: Every evening | ORAL | Status: DC | PRN

## 2013-06-12 ENCOUNTER — Other Ambulatory Visit: Payer: Self-pay | Admitting: General Practice

## 2013-06-12 NOTE — Telephone Encounter (Signed)
Patient last seen in office on 02-05-13. Rx last filled on 04-19-13. Please Advise. If approved please route to Pool B so nurse can phone in to pharmacy

## 2013-06-12 NOTE — Telephone Encounter (Signed)
Scripted printed and signed on 06/11/13 for patient to pick up. Please notify.

## 2013-06-12 NOTE — Telephone Encounter (Signed)
Ambien script ready.

## 2013-07-24 ENCOUNTER — Other Ambulatory Visit: Payer: Self-pay | Admitting: Nurse Practitioner

## 2013-07-25 NOTE — Telephone Encounter (Signed)
Last seen 02/05/13  Mae  If approved route to nurse to call into  [place]

## 2013-07-29 ENCOUNTER — Other Ambulatory Visit: Payer: Self-pay | Admitting: Family Medicine

## 2013-07-31 ENCOUNTER — Telehealth: Payer: Self-pay | Admitting: Nurse Practitioner

## 2013-08-01 ENCOUNTER — Other Ambulatory Visit: Payer: Self-pay | Admitting: Nurse Practitioner

## 2013-08-18 ENCOUNTER — Ambulatory Visit (INDEPENDENT_AMBULATORY_CARE_PROVIDER_SITE_OTHER): Payer: 59 | Admitting: Nurse Practitioner

## 2013-08-18 ENCOUNTER — Encounter: Payer: Self-pay | Admitting: Nurse Practitioner

## 2013-08-18 VITALS — BP 129/84 | HR 79 | Temp 98.2°F | Ht 66.0 in | Wt 205.6 lb

## 2013-08-18 DIAGNOSIS — L678 Other hair color and hair shaft abnormalities: Secondary | ICD-10-CM

## 2013-08-18 DIAGNOSIS — L738 Other specified follicular disorders: Secondary | ICD-10-CM

## 2013-08-18 DIAGNOSIS — L739 Follicular disorder, unspecified: Secondary | ICD-10-CM

## 2013-08-18 MED ORDER — ROSUVASTATIN CALCIUM 40 MG PO TABS: ORAL_TABLET | ORAL | Status: DC

## 2013-08-18 MED ORDER — AMOXICILLIN 875 MG PO TABS: 875.0000 mg | ORAL_TABLET | Freq: Two times a day (BID) | ORAL | Status: DC

## 2013-08-18 MED ORDER — FLUCONAZOLE 150 MG PO TABS: ORAL_TABLET | ORAL | Status: DC

## 2013-08-18 NOTE — Progress Notes (Signed)
   Subjective:    Patient ID: Crystal Wong, female    DOB: 01-27-1956, 58 y.o.   MRN: [id number]  HPI Patient in c/o discoloration in groin area- with bump- Noticed it 2 weeks ago and has gotten bigger- Sore to the touch- slight drainage- Patient huwband tried to pop it and all he got out was blood.    Review of Systems  Constitutional: Negative.   HENT: Negative.   Respiratory: Negative.   Cardiovascular: Negative.   Genitourinary: Negative.   Psychiatric/Behavioral: Negative.   All other systems reviewed and are negative.      Objective:   Physical Exam  Constitutional: She appears well-developed and well-nourished.  Cardiovascular: Normal rate and normal heart sounds.   Pulmonary/Chest: Effort normal and breath sounds normal.  Skin: Skin is warm.  2cm erythematous papular lesion right upper groin area.   BP 129/84  Pulse 79  Temp(Src) 98.2 F (36.8 C) (Oral)  Ht 5\' 6"  (1.676 m)  Wt 205 lb 9.6 oz (93.26 kg)  BMI 33.20 kg/m2        Assessment & Plan:   1. Folliculitis    Meds ordered this encounter  Medications  . amoxicillin (AMOXIL) 875 MG tablet    Sig: Take 1 tablet (875 mg total) by mouth 2 (two) times daily.    Dispense:  20 tablet    Refill:  0    Order Specific Question:  Supervising Provider    Answer:  Chipper Herb [[id number]]  . rosuvastatin (CRESTOR) 40 MG tablet    Sig: TAKE ONE TABLET BY MOUTH ONE TIME DAILY    Dispense:  30 tablet    Refill:  2    Order Specific Question:  Supervising Provider    Answer:  Chipper Herb [[id number]]  . fluconazole (DIFLUCAN) 150 MG tablet    Sig: 1 po now and repeat in 1 week    Dispense:  1 tablet    Refill:  0    Order Specific Question:  Supervising Provider    Answer:  Chipper Herb [[id number]]   Warm compresses D not pick or scratch  [name] Done, FNP

## 2013-08-18 NOTE — Patient Instructions (Signed)
Folliculitis  Folliculitis is redness, soreness, and swelling (inflammation) of the hair follicles. This condition can occur anywhere on the body. People with weakened immune systems, diabetes, or obesity have a greater risk of getting folliculitis. CAUSES  Bacterial infection. This is the most common cause.  Fungal infection.  Viral infection.  Contact with certain chemicals, especially oils and tars. Long-term folliculitis can result from bacteria that live in the nostrils. The bacteria may trigger multiple outbreaks of folliculitis over time. SYMPTOMS Folliculitis most commonly occurs on the scalp, thighs, legs, back, buttocks, and areas where hair is shaved frequently. An early sign of folliculitis is a small, white or yellow, pus-filled, itchy lesion (pustule). These lesions appear on a red, inflamed follicle. They are usually less than 0.2 inches (5 mm) wide. When there is an infection of the follicle that goes deeper, it becomes a boil or furuncle. A group of closely packed boils creates a larger lesion (carbuncle). Carbuncles tend to occur in hairy, sweaty areas of the body. DIAGNOSIS  Your caregiver can usually tell what is wrong by doing a physical exam. A sample may be taken from one of the lesions and tested in a lab. This can help determine what is causing your folliculitis. TREATMENT  Treatment may include:  Applying warm compresses to the affected areas.  Taking antibiotic medicines orally or applying them to the skin.  Draining the lesions if they contain a large amount of pus or fluid.  Laser hair removal for cases of long-lasting folliculitis. This helps to prevent regrowth of the hair. HOME CARE INSTRUCTIONS  Apply warm compresses to the affected areas as directed by your caregiver.  If antibiotics are prescribed, take them as directed. Finish them even if you start to feel better.  You may take over-the-counter medicines to relieve itching.  Do not shave  irritated skin.  Follow up with your caregiver as directed. SEEK IMMEDIATE MEDICAL CARE IF:   You have increasing redness, swelling, or pain in the affected area.  You have a fever. MAKE SURE YOU:  Understand these instructions.  Will watch your condition.  Will get help right away if you are not doing well or get worse. Document Released: 05/01/2001 Document Revised: 08/22/2011 Document Reviewed: 05/23/2011 Exit[employer] Patient Information 2014 [employer].  

## 2013-08-27 ENCOUNTER — Other Ambulatory Visit: Payer: Self-pay | Admitting: Nurse Practitioner

## 2013-09-22 ENCOUNTER — Other Ambulatory Visit: Payer: Self-pay | Admitting: General Practice

## 2013-09-22 NOTE — Telephone Encounter (Signed)
Patient last seen in office on 08-18-13. Rx last filled on 07-30-13 for #30. Please advise. If approved please route to Pool B so nurse can phone in to pharmacy

## 2013-09-22 NOTE — Telephone Encounter (Addendum)
Please call in ambien with 1 refills 

## 2013-09-23 NOTE — Telephone Encounter (Signed)
Called in.

## 2013-10-09 ENCOUNTER — Other Ambulatory Visit: Payer: Self-pay | Admitting: Nurse Practitioner

## 2013-10-10 NOTE — Telephone Encounter (Signed)
Last seen 08/18/13  MMM  If approved route to nurse to call into [facility]

## 2013-10-11 NOTE — Telephone Encounter (Signed)
Please call in ativan with 1 refills 

## 2013-10-11 NOTE — Telephone Encounter (Signed)
Called in.

## 2013-10-31 ENCOUNTER — Other Ambulatory Visit: Payer: Self-pay

## 2013-10-31 MED ORDER — CITALOPRAM HYDROBROMIDE 40 MG PO TABS: ORAL_TABLET | ORAL | Status: DC

## 2013-11-09 ENCOUNTER — Other Ambulatory Visit: Payer: Self-pay | Admitting: *Deleted

## 2013-11-09 NOTE — Telephone Encounter (Signed)
Last filled 09/23/13, last seen 08/18/13. Nurse call in to [facility]

## 2013-11-11 MED ORDER — ZOLPIDEM TARTRATE 10 MG PO TABS: ORAL_TABLET | ORAL | Status: DC

## 2013-11-11 NOTE — Telephone Encounter (Signed)
Please call in ambien with 1 refills 

## 2013-11-11 NOTE — Telephone Encounter (Signed)
rx called into pharmacy

## 2013-11-19 ENCOUNTER — Encounter: Payer: Self-pay | Admitting: Nurse Practitioner

## 2013-11-19 ENCOUNTER — Ambulatory Visit (INDEPENDENT_AMBULATORY_CARE_PROVIDER_SITE_OTHER): Payer: No Typology Code available for payment source | Admitting: Nurse Practitioner

## 2013-11-19 ENCOUNTER — Telehealth: Payer: Self-pay | Admitting: Nurse Practitioner

## 2013-11-19 VITALS — BP 123/83 | HR 76 | Temp 99.9°F | Ht 66.0 in | Wt 202.8 lb

## 2013-11-19 DIAGNOSIS — J0100 Acute maxillary sinusitis, unspecified: Secondary | ICD-10-CM

## 2013-11-19 MED ORDER — AMOXICILLIN 875 MG PO TABS: 875.0000 mg | ORAL_TABLET | Freq: Two times a day (BID) | ORAL | Status: DC

## 2013-11-19 MED ORDER — CHLORPHEN-PE-ACETAMINOPHEN 4-10-325 MG PO TABS: 1.0000 | ORAL_TABLET | Freq: Four times a day (QID) | ORAL | Status: DC | PRN

## 2013-11-19 NOTE — Telephone Encounter (Signed)
appt scheduled

## 2013-11-19 NOTE — Patient Instructions (Signed)

## 2013-11-19 NOTE — Progress Notes (Signed)
Subjective:    Patient ID: Crystal Wong, female    DOB: [birth year], 58 y.o.   MRN: [id number]  URI  This is a new problem. The current episode started in the past 7 days. The problem has been unchanged. The maximum temperature recorded prior to her arrival was 101 - 101.9 F. The fever has been present for 1 to 2 days. Associated symptoms include coughing, ear pain, rhinorrhea, sinus pain and sneezing. Pertinent negatives include no neck pain. Treatments tried: advil congestion, mucinex.  The treatment provided mild relief.  Otalgia  There is pain in both ears. This is a new problem. The current episode started in the past 7 days. The problem occurs constantly. The problem has been unchanged. The maximum temperature recorded prior to her arrival was 101 - 101.9 F. The fever has been present for 1 to 2 days. Pain severity now: pressure.  Associated symptoms include coughing, hearing loss (bil ear. ) and rhinorrhea. Pertinent negatives include no ear discharge or neck pain. She has tried nothing for the symptoms. There is no history of a chronic ear infection, hearing loss or a tympanostomy tube.      Review of Systems  Constitutional: Negative.   HENT: Positive for ear pain, hearing loss (bil ear. ), rhinorrhea and sneezing. Negative for ear discharge.   Eyes: Negative.   Respiratory: Positive for cough.   Cardiovascular: Negative.   Gastrointestinal: Negative.   Endocrine: Negative.   Genitourinary: Negative.   Musculoskeletal: Negative.  Negative for neck pain.  Skin: Negative.   Allergic/Immunologic: Negative.   Neurological: Negative.   Hematological: Negative.   Psychiatric/Behavioral: Negative.        Objective:   Physical Exam  Constitutional: She is oriented to person, place, and time. She appears well-developed and well-nourished.  HENT:  Head: Normocephalic and atraumatic.  Eyes: Conjunctivae are normal. Pupils are equal, round, and reactive to light.  Neck: Normal  range of motion. Neck supple.  Cardiovascular: Normal rate and regular rhythm.   Pulmonary/Chest: Effort normal.  Musculoskeletal: Normal range of motion.  Neurological: She is alert and oriented to person, place, and time.  Skin: Skin is warm and dry.  Psychiatric: She has a normal mood and affect.    BP 123/83  Pulse 76  Temp(Src) 99.9 F (37.7 C) (Oral)  Ht 5\' 6"  (1.676 m)  Wt 202 lb 12.8 oz (91.989 kg)  BMI 32.75 kg/m2       Assessment & Plan:   1. Acute maxillary sinusitis, recurrence not specified    Meds ordered this encounter  Medications  . amoxicillin (AMOXIL) 875 MG tablet    Sig: Take 1 tablet (875 mg total) by mouth 2 (two) times daily.    Dispense:  20 tablet    Refill:  0    Order Specific Question:  Supervising Provider    Answer:  Chipper Herb [[id number]]  . Chlorphen-PE-Acetaminophen 4-10-325 MG TABS    Sig: Take 1 tablet by mouth every 6 (six) hours as needed.    Dispense:  30 tablet    Refill:  0    Order Specific Question:  Supervising Provider    Answer:  Chipper Herb [[id number]]   1. Take meds as prescribed 2. Use a cool mist humidifier especially during the winter months and when heat has been humid. 3. Use saline nose sprays frequently 4. Saline irrigations of the nose can be very helpful if done frequently.  * 4X daily for 1 week*  *  Use of a nettie pot can be helpful with this. Follow directions with this* 5. Drink plenty of fluids 6. Keep thermostat turn down low 7.For any cough or congestion  Use plain Mucinex- regular strength or max strength is fine   * Children- consult with Pharmacist for dosing 8. For fever or aces or pains- take tylenol or ibuprofen appropriate for age and weight.  * for fevers greater than 101 orally you may alternate ibuprofen and tylenol every  3 hours.   [name] Done, FNP

## 2013-11-24 ENCOUNTER — Other Ambulatory Visit: Payer: Self-pay | Admitting: *Deleted

## 2013-11-24 MED ORDER — FLUCONAZOLE 150 MG PO TABS: 150.0000 mg | ORAL_TABLET | Freq: Once | ORAL | Status: DC

## 2013-11-27 ENCOUNTER — Telehealth: Payer: Self-pay | Admitting: Nurse Practitioner

## 2013-11-27 MED ORDER — FLUCONAZOLE 150 MG PO TABS: 150.0000 mg | ORAL_TABLET | Freq: Once | ORAL | Status: DC

## 2013-11-27 NOTE — Telephone Encounter (Signed)
Patient aware.

## 2013-11-27 NOTE — Telephone Encounter (Signed)
Finish antibiotic before taking another diflucan

## 2013-11-28 ENCOUNTER — Other Ambulatory Visit: Payer: Self-pay | Admitting: *Deleted

## 2013-11-28 MED ORDER — CITALOPRAM HYDROBROMIDE 40 MG PO TABS: ORAL_TABLET | ORAL | Status: DC

## 2013-12-18 ENCOUNTER — Other Ambulatory Visit: Payer: Self-pay | Admitting: Nurse Practitioner

## 2013-12-20 NOTE — Telephone Encounter (Signed)
Patient last seen in office on 11-19-13 for an acute visit. Rx last filled on 10-11-13 for #30. Please advise.

## 2013-12-21 NOTE — Telephone Encounter (Signed)
Please call in ativan with 1 refills 

## 2013-12-22 NOTE — Telephone Encounter (Signed)
Left authorization on voicemail 

## 2013-12-23 ENCOUNTER — Ambulatory Visit (INDEPENDENT_AMBULATORY_CARE_PROVIDER_SITE_OTHER): Payer: No Typology Code available for payment source | Admitting: Nurse Practitioner

## 2013-12-23 ENCOUNTER — Encounter: Payer: Self-pay | Admitting: Nurse Practitioner

## 2013-12-23 ENCOUNTER — Ambulatory Visit (INDEPENDENT_AMBULATORY_CARE_PROVIDER_SITE_OTHER): Payer: No Typology Code available for payment source

## 2013-12-23 VITALS — BP 122/74 | HR 73 | Temp 97.8°F | Ht 66.5 in | Wt 203.4 lb

## 2013-12-23 DIAGNOSIS — Z01419 Encounter for gynecological examination (general) (routine) without abnormal findings: Secondary | ICD-10-CM

## 2013-12-23 DIAGNOSIS — Z0000 Encounter for general adult medical examination without abnormal findings: Secondary | ICD-10-CM

## 2013-12-23 DIAGNOSIS — L918 Other hypertrophic disorders of the skin: Secondary | ICD-10-CM

## 2013-12-23 DIAGNOSIS — Z7722 Contact with and (suspected) exposure to environmental tobacco smoke (acute) (chronic): Secondary | ICD-10-CM

## 2013-12-23 DIAGNOSIS — F32A Depression, unspecified: Secondary | ICD-10-CM

## 2013-12-23 DIAGNOSIS — E785 Hyperlipidemia, unspecified: Secondary | ICD-10-CM

## 2013-12-23 DIAGNOSIS — J4521 Mild intermittent asthma with (acute) exacerbation: Secondary | ICD-10-CM

## 2013-12-23 DIAGNOSIS — K219 Gastro-esophageal reflux disease without esophagitis: Secondary | ICD-10-CM

## 2013-12-23 DIAGNOSIS — G4700 Insomnia, unspecified: Secondary | ICD-10-CM

## 2013-12-23 DIAGNOSIS — F411 Generalized anxiety disorder: Secondary | ICD-10-CM

## 2013-12-23 DIAGNOSIS — F329 Major depressive disorder, single episode, unspecified: Secondary | ICD-10-CM

## 2013-12-23 LAB — POCT CBC
GRANULOCYTE PERCENT: 70.8 % (ref 37–80)
HCT, POC: 40.4 % (ref 37.7–47.9)
Hemoglobin: 13.1 g/dL (ref 12.2–16.2)
Lymph, poc: 1.7 (ref 0.6–3.4)
MCH, POC: 28.1 pg (ref 27–31.2)
MCHC: 32.4 g/dL (ref 31.8–35.4)
MCV: 86.7 fL (ref 80–97)
MPV: 8.5 fL (ref 0–99.8)
PLATELET COUNT, POC: 234 10*3/uL (ref 142–424)
POC Granulocyte: 4.5 (ref 2–6.9)
POC LYMPH PERCENT: 26.6 %L (ref 10–50)
RBC: 4.7 M/uL (ref 4.04–5.48)
RDW, POC: 13.6 %
WBC: 6.4 10*3/uL (ref 4.6–10.2)

## 2013-12-23 LAB — POCT UA - MICROSCOPIC ONLY
BACTERIA, U MICROSCOPIC: NEGATIVE
CASTS, UR, LPF, POC: NEGATIVE
CRYSTALS, UR, HPF, POC: NEGATIVE
Mucus, UA: NEGATIVE
RBC, urine, microscopic: NEGATIVE
WBC, Ur, HPF, POC: NEGATIVE
Yeast, UA: NEGATIVE

## 2013-12-23 LAB — POCT URINALYSIS DIPSTICK
Bilirubin, UA: NEGATIVE
Blood, UA: NEGATIVE
Glucose, UA: NEGATIVE
Ketones, UA: NEGATIVE
Leukocytes, UA: NEGATIVE
NITRITE UA: NEGATIVE
PH UA: 6
Protein, UA: NEGATIVE
SPEC GRAV UA: 1.02
Urobilinogen, UA: NEGATIVE

## 2013-12-23 MED ORDER — MOMETASONE FURO-FORMOTEROL FUM 100-5 MCG/ACT IN AERO: 1.0000 | INHALATION_SPRAY | Freq: Once | RESPIRATORY_TRACT | Status: DC

## 2013-12-23 MED ORDER — ZOLPIDEM TARTRATE 10 MG PO TABS: ORAL_TABLET | ORAL | Status: DC

## 2013-12-23 MED ORDER — LORAZEPAM 1 MG PO TABS: ORAL_TABLET | ORAL | Status: DC

## 2013-12-23 MED ORDER — CITALOPRAM HYDROBROMIDE 40 MG PO TABS: ORAL_TABLET | ORAL | Status: DC

## 2013-12-23 MED ORDER — ALBUTEROL SULFATE HFA 108 (90 BASE) MCG/ACT IN AERS: 2.0000 | INHALATION_SPRAY | Freq: Four times a day (QID) | RESPIRATORY_TRACT | Status: DC | PRN

## 2013-12-23 NOTE — Progress Notes (Signed)
Subjective:    Patient ID: Crystal Wong, female    DOB: 23-May-1955, 58 y.o.   MRN: [id number]  Patient here today for annual physical exam, pap and follow up of chronic medical problems. She is do well today without complaints.  Hyperlipidemia This is a chronic problem. The current episode started more than 1 year ago. The problem is uncontrolled. Recent lipid tests were reviewed and are low. She has no history of hypothyroidism or obesity. Current antihyperlipidemic treatment includes diet change and statins. The current treatment provides mild improvement of lipids. Compliance problems include adherence to diet and adherence to exercise.  Risk factors for coronary artery disease include dyslipidemia, obesity and post-menopausal.  depression/GAD celexa and ativan combination- doing well- still gets anxious on occasion. GERD Zantac daily keeps symptoms under control Asthma dulera helps keep her from coughing. SHe has had frequent sinus infections this year. insomnia Lorrin Mais works well to help her sleep   Review of Systems  Constitutional: Negative.   HENT: Negative.   Respiratory: Negative.   Cardiovascular: Negative.   Genitourinary: Negative.   Neurological: Negative.   Psychiatric/Behavioral: Negative.   All other systems reviewed and are negative.      Objective:   Physical Exam  Constitutional: She is oriented to person, place, and time. She appears well-developed and well-nourished.  HENT:  Head: Normocephalic.  Right Ear: Hearing, tympanic membrane, external ear and ear canal normal.  Left Ear: Hearing, tympanic membrane, external ear and ear canal normal.  Nose: Nose normal.  Mouth/Throat: Uvula is midline and oropharynx is clear and moist.  Eyes: Conjunctivae and EOM are normal. Pupils are equal, round, and reactive to light.  Neck: Normal range of motion and full passive range of motion without pain. Neck supple. No JVD present. Carotid bruit is not present. No  mass and no thyromegaly present.  Cardiovascular: Normal rate, normal heart sounds and intact distal pulses.   No murmur heard. Pulmonary/Chest: Effort normal and breath sounds normal. Right breast exhibits no inverted nipple, no mass, no nipple discharge, no skin change and no tenderness. Left breast exhibits no inverted nipple, no mass, no nipple discharge, no skin change and no tenderness.  Abdominal: Soft. Bowel sounds are normal. She exhibits no mass. There is no tenderness.  Genitourinary: Vagina normal and uterus normal. No breast swelling, tenderness, discharge or bleeding.  bimanual exam-No adnexal masses or tenderness. Vaginal cuff intact  Musculoskeletal: Normal range of motion.  Lymphadenopathy:    She has no cervical adenopathy.  Neurological: She is alert and oriented to person, place, and time.  Skin: Skin is warm and dry.  Multiple skin tags- scattered all over body  Psychiatric: She has a normal mood and affect. Her behavior is normal. Judgment and thought content normal.    BP 122/74  Pulse 73  Temp(Src) 97.8 F (36.6 C) (Oral)  Ht 5' 6.5" (1.689 m)  Wt 203 lb 6 oz (92.25 kg)  BMI 32.34 kg/m2  EKG- NSR- [name] Done, FNP  Chest x ray- normal no acute findings-Preliminary reading by Ronnald Collum, FNP  [facility]      Assessment & Plan:  1. Annual physical exam - POCT urinalysis dipstick - POCT UA - Microscopic Only - POCT CBC - Thyroid Panel With TSH  2. Insomnia Bedtime ritual - zolpidem (AMBIEN) 10 MG tablet; TAKE ONE TABLET BY MOUTH AT BEDTIME AS NEEDED FOR SLEEP  Dispense: 30 tablet; Refill: 1  3. Hyperlipidemia Low fat diet - CMP14+EGFR - NMR, lipoprofile -  EKG 12-Lead  4. Gastroesophageal reflux disease without esophagitis Avoid spicy and fatty foods  5. GAD (generalized anxiety disorder) Stress management - LORazepam (ATIVAN) 1 MG tablet; TAKE  ONE TABLET BY MOUTH EVERY 8 HOURS AS NEEDED  Dispense: 30 tablet; Refill: 1  6. Depression -  citalopram (CELEXA) 40 MG tablet; TAKE ONE TABLET BY MOUTH ONE TIME DAILY  Dispense: 30 tablet; Refill: 5  7. Second hand smoke exposure - DG Chest 2 View; Future  8. Encounter for routine gynecological examination - Pap IG w/ reflex to HPV when ASC-U  9. Asthma with acute exacerbation, mild intermittent Avoid allergens - mometasone-formoterol (DULERA) 100-5 MCG/ACT AERO; Inhale 1 puff into the lungs once.  Dispense: 1 Inhaler; Refill: 1 - albuterol (PROVENTIL HFA;VENTOLIN HFA) 108 (90 BASE) MCG/ACT inhaler; Inhale 2 puffs into the lungs every 6 (six) hours as needed for wheezing.  Dispense: 1 Inhaler; Refill: 1  10. Cutaneous skin tags - Ambulatory referral to Dermatology    Labs pending Health maintenance reviewed Diet and exercise encouraged Continue all meds Follow up  In 6 month   [place], FNP

## 2013-12-24 LAB — CMP14+EGFR
ALBUMIN: 4.4 g/dL (ref 3.5–5.5)
ALT: 21 IU/L (ref 0–32)
AST: 21 IU/L (ref 0–40)
Albumin/Globulin Ratio: 1.7 (ref 1.1–2.5)
Alkaline Phosphatase: 68 IU/L (ref 39–117)
BUN/Creatinine Ratio: 16 (ref 9–23)
BUN: 14 mg/dL (ref 6–24)
CALCIUM: 9.6 mg/dL (ref 8.7–10.2)
CO2: 23 mmol/L (ref 18–29)
CREATININE: 0.88 mg/dL (ref 0.57–1.00)
Chloride: 98 mmol/L (ref 97–108)
GFR calc Af Amer: 85 mL/min/{1.73_m2} (ref >59)
GFR calc non Af Amer: 74 mL/min/{1.73_m2} (ref >59)
GLOBULIN, TOTAL: 2.6 g/dL (ref 1.5–4.5)
Glucose: 103 mg/dL — ABNORMAL HIGH (ref 65–99)
Potassium: 4.3 mmol/L (ref 3.5–5.2)
Sodium: 137 mmol/L (ref 134–144)
TOTAL PROTEIN: 7 g/dL (ref 6.0–8.5)
Total Bilirubin: 0.3 mg/dL (ref 0.0–1.2)

## 2013-12-24 LAB — THYROID PANEL WITH TSH
FREE THYROXINE INDEX: 1.7 (ref 1.2–4.9)
T3 Uptake Ratio: 28 % (ref 24–39)
T4, Total: 6 ug/dL (ref 4.5–12.0)
TSH: 1.38 u[IU]/mL (ref 0.450–4.500)

## 2013-12-24 LAB — PAP IG W/ RFLX HPV ASCU: PAP Smear Comment: 0

## 2013-12-24 LAB — NMR, LIPOPROFILE
Cholesterol: 246 mg/dL — ABNORMAL HIGH (ref 100–199)
HDL CHOLESTEROL BY NMR: 58 mg/dL (ref >39)
HDL PARTICLE NUMBER: 31.9 umol/L (ref >=30.5)
LDL Particle Number: 2052 nmol/L — ABNORMAL HIGH (ref <1000)
LDL Size: 21.5 nm (ref >20.5)
LDLC SERPL CALC-MCNC: 163 mg/dL — ABNORMAL HIGH (ref 0–99)
LP-IR Score: 43 (ref <=45)
Small LDL Particle Number: 583 nmol/L — ABNORMAL HIGH (ref <=527)
TRIGLYCERIDES BY NMR: 125 mg/dL (ref 0–149)

## 2013-12-29 ENCOUNTER — Telehealth: Payer: Self-pay | Admitting: Nurse Practitioner

## 2013-12-29 ENCOUNTER — Other Ambulatory Visit: Payer: Self-pay | Admitting: Nurse Practitioner

## 2013-12-29 MED ORDER — FLUCONAZOLE 150 MG PO TABS: 150.0000 mg | ORAL_TABLET | Freq: Once | ORAL | Status: DC

## 2013-12-29 NOTE — Telephone Encounter (Signed)
I sent the Rx to the pharmacy.

## 2014-01-19 ENCOUNTER — Other Ambulatory Visit: Payer: Self-pay | Admitting: Nurse Practitioner

## 2014-01-28 ENCOUNTER — Other Ambulatory Visit: Payer: Self-pay | Admitting: Nurse Practitioner

## 2014-02-17 ENCOUNTER — Ambulatory Visit (INDEPENDENT_AMBULATORY_CARE_PROVIDER_SITE_OTHER): Payer: No Typology Code available for payment source | Admitting: Nurse Practitioner

## 2014-02-17 ENCOUNTER — Encounter: Payer: Self-pay | Admitting: Nurse Practitioner

## 2014-02-17 VITALS — BP 122/79 | HR 83 | Temp 98.4°F | Ht 66.5 in | Wt 201.8 lb

## 2014-02-17 DIAGNOSIS — J209 Acute bronchitis, unspecified: Secondary | ICD-10-CM

## 2014-02-17 MED ORDER — HYDROCODONE-HOMATROPINE 5-1.5 MG/5ML PO SYRP: 5.0000 mL | ORAL_SOLUTION | Freq: Three times a day (TID) | ORAL | Status: DC | PRN

## 2014-02-17 MED ORDER — AZITHROMYCIN 250 MG PO TABS: ORAL_TABLET | ORAL | Status: DC

## 2014-02-17 MED ORDER — METHYLPREDNISOLONE ACETATE 80 MG/ML IJ SUSP
80.0000 mg | Freq: Once | INTRAMUSCULAR | Status: AC
  Administered 2014-02-17: 80 mg via INTRAMUSCULAR

## 2014-02-17 MED ORDER — BENZONATATE 100 MG PO CAPS: 100.0000 mg | ORAL_CAPSULE | Freq: Two times a day (BID) | ORAL | Status: DC | PRN

## 2014-02-17 NOTE — Progress Notes (Signed)
   Subjective:    Patient ID: Crystal Wong, female    DOB: [birth year], 58 y.o.   MRN: [id number]  HPI Patient in today c/o cough. Staretd about 3 weeks ago- this happens frequently- unable to sleep due to cough.    Review of Systems  Constitutional: Negative for fever, chills and appetite change.  HENT: Positive for congestion and postnasal drip. Negative for sore throat, trouble swallowing and voice change.   Respiratory: Positive for cough.   Cardiovascular: Negative.   Gastrointestinal: Negative.   Genitourinary: Negative.   Musculoskeletal: Negative.   Neurological: Negative.   Psychiatric/Behavioral: Negative.        Objective:   Physical Exam  Constitutional: She is oriented to person, place, and time. She appears well-developed and well-nourished. No distress.  HENT:  Right Ear: Hearing, tympanic membrane, external ear and ear canal normal.  Left Ear: Hearing, tympanic membrane, external ear and ear canal normal.  Nose: Mucosal edema and rhinorrhea present. Right sinus exhibits no maxillary sinus tenderness and no frontal sinus tenderness. Left sinus exhibits no maxillary sinus tenderness and no frontal sinus tenderness.  Mouth/Throat: Uvula is midline, oropharynx is clear and moist and mucous membranes are normal.  Eyes: Pupils are equal, round, and reactive to light.  Neck: Normal range of motion. Neck supple.  Cardiovascular: Normal rate, regular rhythm and normal heart sounds.   Pulmonary/Chest: Effort normal and breath sounds normal.  Deep tight cough  Lymphadenopathy:    She has no cervical adenopathy.  Neurological: She is alert and oriented to person, place, and time.  Skin: Skin is warm and dry.  Psychiatric: She has a normal mood and affect. Her behavior is normal. Judgment and thought content normal.   BP 122/79 mmHg  Pulse 83  Temp(Src) 98.4 F (36.9 C) (Oral)  Ht 5' 6.5" (1.689 m)  Wt 201 lb 12.8 oz (91.536 kg)  BMI 32.09 kg/m2        Assessment  & Plan:  1. Acute bronchitis, unspecified organism 1. Take meds as prescribed 2. Use a cool mist humidifier especially during the winter months and when heat has been humid. 3. Use saline nose sprays frequently 4. Saline irrigations of the nose can be very helpful if done frequently.  * 4X daily for 1 week*  * Use of a nettie pot can be helpful with this. Follow directions with this* 5. Drink plenty of fluids 6. Keep thermostat turn down low 7.For any cough or congestion  Use plain Mucinex- regular strength or max strength is fine   * Children- consult with Pharmacist for dosing 8. For fever or aces or pains- take tylenol or ibuprofen appropriate for age and weight.  * for fevers greater than 101 orally you may alternate ibuprofen and tylenol every  3 hours.   - azithromycin (ZITHROMAX Z-PAK) 250 MG tablet; As directed  Dispense: 6 each; Refill: 0 - HYDROcodone-homatropine (HYCODAN) 5-1.5 MG/5ML syrup; Take 5 mLs by mouth every 8 (eight) hours as needed for cough.  Dispense: 120 mL; Refill: 0 - benzonatate (TESSALON) 100 MG capsule; Take 1 capsule (100 mg total) by mouth 2 (two) times daily as needed for cough.  Dispense: 20 capsule; Refill: 0 - methylPREDNISolone acetate (DEPO-MEDROL) injection 80 mg; Inject 1 mL (80 mg total) into the muscle once.  [name] Done, FNP

## 2014-02-17 NOTE — Patient Instructions (Signed)

## 2014-02-23 ENCOUNTER — Ambulatory Visit (INDEPENDENT_AMBULATORY_CARE_PROVIDER_SITE_OTHER): Payer: No Typology Code available for payment source | Admitting: Family Medicine

## 2014-02-23 ENCOUNTER — Encounter: Payer: Self-pay | Admitting: Family Medicine

## 2014-02-23 VITALS — BP 145/87 | HR 86 | Temp 97.7°F | Ht 66.5 in | Wt 202.0 lb

## 2014-02-23 DIAGNOSIS — J209 Acute bronchitis, unspecified: Secondary | ICD-10-CM

## 2014-02-23 DIAGNOSIS — J40 Bronchitis, not specified as acute or chronic: Secondary | ICD-10-CM

## 2014-02-23 LAB — POCT CBC
GRANULOCYTE PERCENT: 66.7 % (ref 37–80)
HCT, POC: 41.4 % (ref 37.7–47.9)
Hemoglobin: 13.6 g/dL (ref 12.2–16.2)
LYMPH, POC: 2.6 (ref 0.6–3.4)
MCH, POC: 28.2 pg (ref 27–31.2)
MCHC: 32.9 g/dL (ref 31.8–35.4)
MCV: 85.7 fL (ref 80–97)
MPV: 8.8 fL (ref 0–99.8)
PLATELET COUNT, POC: 262 10*3/uL (ref 142–424)
POC Granulocyte: 5.6 (ref 2–6.9)
POC LYMPH PERCENT: 30.6 %L (ref 10–50)
RBC: 4.8 M/uL (ref 4.04–5.48)
RDW, POC: 12.7 %
WBC: 8.4 10*3/uL (ref 4.6–10.2)

## 2014-02-23 MED ORDER — METHYLPREDNISOLONE ACETATE 80 MG/ML IJ SUSP
40.0000 mg | Freq: Once | INTRAMUSCULAR | Status: AC
  Administered 2014-02-23: 40 mg via INTRAMUSCULAR

## 2014-02-23 MED ORDER — PREDNISONE 10 MG PO TABS: ORAL_TABLET | ORAL | Status: DC

## 2014-02-23 NOTE — Progress Notes (Signed)
Subjective:    Patient ID: Crystal Wong, female    DOB: 10/16/1955, 58 y.o.   MRN: [id number]  HPI Patient here today for follow up on cough and congestion. She has already completed a round a antibiotic. The patient completed a Z-Pak. She received 80 mg of Depo-Medrol and has Hycodan at home for the severe cough. She is using and humidifier and trying to drink as many fluids as possible.         Patient Active Problem List   Diagnosis Date Noted  . Hyperlipidemia 08/23/2012  . Insomnia 08/23/2012  . GERD (gastroesophageal reflux disease) 08/23/2012  . Depression 08/23/2012  . GAD (generalized anxiety disorder) 08/23/2012   Outpatient Encounter Prescriptions as of 02/23/2014  Medication Sig  . albuterol (PROVENTIL HFA;VENTOLIN HFA) 108 (90 BASE) MCG/ACT inhaler Inhale 2 puffs into the lungs every 6 (six) hours as needed for wheezing.  . benzonatate (TESSALON) 100 MG capsule Take 1 capsule (100 mg total) by mouth 2 (two) times daily as needed for cough.  . citalopram (CELEXA) 40 MG tablet TAKE ONE TABLET BY MOUTH ONE TIME DAILY  . CRESTOR 40 MG tablet TAKE ONE TABLET BY MOUTH ONE TIME DAILY  . HYDROcodone-homatropine (HYCODAN) 5-1.5 MG/5ML syrup Take 5 mLs by mouth every 8 (eight) hours as needed for cough.  Marland Kitchen LORazepam (ATIVAN) 1 MG tablet TAKE  ONE TABLET BY MOUTH EVERY 8 HOURS AS NEEDED  . mometasone-formoterol (DULERA) 100-5 MCG/ACT AERO Inhale 1 puff into the lungs once.  . ranitidine (ZANTAC) 150 MG tablet Take 150 mg by mouth 2 (two) times daily.  Marland Kitchen zolpidem (AMBIEN) 10 MG tablet TAKE ONE TABLET BY MOUTH AT BEDTIME AS NEEDED FOR SLEEP  . [DISCONTINUED] azithromycin (ZITHROMAX Z-PAK) 250 MG tablet As directed  . [DISCONTINUED] fluconazole (DIFLUCAN) 150 MG tablet TAKE ONE TABLET BY MOUTH NOW (Patient not taking: Reported on 02/17/2014)    Review of Systems  Constitutional: Negative.   HENT: Positive for congestion.   Eyes: Negative.   Respiratory: Positive for cough.    Cardiovascular: Negative.   Gastrointestinal: Negative.   Endocrine: Negative.   Genitourinary: Negative.   Musculoskeletal: Negative.   Skin: Negative.   Allergic/Immunologic: Negative.   Neurological: Negative.   Hematological: Negative.   Psychiatric/Behavioral: Negative.        Objective:   Physical Exam  Constitutional: She is oriented to person, place, and time. She appears well-developed and well-nourished. No distress.  The patient is alert and cooperative and has trouble, talking without coughing  HENT:  Head: Normocephalic and atraumatic.  Right Ear: External ear normal.  Left Ear: External ear normal.  Nose: Nose normal.  Mouth/Throat: Oropharynx is clear and moist. No oropharyngeal exudate.  Eyes: Conjunctivae and EOM are normal. Pupils are equal, round, and reactive to light. Right eye exhibits no discharge. Left eye exhibits no discharge. No scleral icterus.  Neck: Normal range of motion. Neck supple. No thyromegaly present.  Cardiovascular: Normal rate, regular rhythm and normal heart sounds.  Exam reveals no friction rub.   No murmur heard. Pulmonary/Chest: Effort normal and breath sounds normal. No respiratory distress. She has no wheezes. She has no rales. She exhibits no tenderness.  The patient has a dry cough and there are no rales or wheezes or rhonchi.  Abdominal: She exhibits no mass.  Musculoskeletal: Normal range of motion. She exhibits no edema.  Lymphadenopathy:    She has no cervical adenopathy.  Neurological: She is alert and oriented to person, place, and  time.  Skin: Skin is warm and dry. No rash noted.  Psychiatric: She has a normal mood and affect. Her behavior is normal. Judgment and thought content normal.  Nursing note and vitals reviewed.  BP 145/87 mmHg  Pulse 86  Temp(Src) 97.7 F (36.5 C) (Oral)  Ht 5' 6.5" (1.689 m)  Wt 202 lb (91.627 kg)  BMI 32.12 kg/m2 Results for orders placed or performed in visit on 02/23/14  POCT CBC    Result Value Ref Range   WBC 8.4 4.6 - 10.2 K/uL   Lymph, poc 2.6 0.6 - 3.4   POC LYMPH PERCENT 30.6 10 - 50 %L   POC Granulocyte 5.6 2 - 6.9   Granulocyte percent 66.7 37 - 80 %G   RBC 4.8 4.04 - 5.48 M/uL   Hemoglobin 13.6 12.2 - 16.2 g/dL   HCT, POC 41.4 37.7 - 47.9 %   MCV 85.7 80 - 97 fL   MCH, POC 28.2 27 - 31.2 pg   MCHC 32.9 31.8 - 35.4 g/dL   RDW, POC 12.7 %   Platelet Count, POC 262.0 142 - 424 K/uL   MPV 8.8 0 - 99.8 fL          Assessment & Plan:  1. Bronchitis with bronchospasm - predniSONE (DELTASONE) 10 MG tablet; 1 tablet 4 times a day for 2 days,  1 tablet 3 times a day for 2 days,  1 tablet 2 times a day for 2 days, 1 tablet daily for 2 days  Dispense: 20 tablet; Refill: 0   Patient Instructions  Continue with cool mist humidifier Cont with cough syrup and pearles  We will call you about the labs  Keep the house as cool as possible Make sure that the heat is not blowing on you Take Mucinex maximum strength 1 twice daily with a large glass of water if the Tessalon Perles do not seem to be helping. Take prednisone as directed starting in the morning   Arrie Senate MD

## 2014-02-23 NOTE — Patient Instructions (Addendum)
Continue with cool mist humidifier Cont with cough syrup and pearles  We will call you about the labs  Keep the house as cool as possible Make sure that the heat is not blowing on you Take Mucinex maximum strength 1 twice daily with a large glass of water if the Tessalon Perles do not seem to be helping. Take prednisone as directed starting in the morning

## 2014-03-02 ENCOUNTER — Other Ambulatory Visit: Payer: Self-pay | Admitting: Nurse Practitioner

## 2014-03-02 NOTE — Telephone Encounter (Signed)
Called and left message stating rx sent to pharmacy.

## 2014-03-02 NOTE — Telephone Encounter (Signed)
Pt is requesting diflucan rx, was seen in office 12/21 for bronchitis, z-pak and solumedrol given, ok to refill? If so will go electronically to [facility] in [place].

## 2014-03-23 ENCOUNTER — Encounter: Payer: Self-pay | Admitting: Family Medicine

## 2014-03-23 ENCOUNTER — Other Ambulatory Visit: Payer: Self-pay | Admitting: Nurse Practitioner

## 2014-03-23 ENCOUNTER — Ambulatory Visit (INDEPENDENT_AMBULATORY_CARE_PROVIDER_SITE_OTHER): Payer: No Typology Code available for payment source | Admitting: Family Medicine

## 2014-03-23 VITALS — BP 118/72 | HR 77 | Temp 97.5°F | Ht 66.5 in | Wt 204.8 lb

## 2014-03-23 DIAGNOSIS — J4531 Mild persistent asthma with (acute) exacerbation: Secondary | ICD-10-CM | POA: Diagnosis not present

## 2014-03-23 MED ORDER — BETAMETHASONE SOD PHOS & ACET 6 (3-3) MG/ML IJ SUSP
12.0000 mg | Freq: Once | INTRAMUSCULAR | Status: AC
  Administered 2014-03-23: 6 mg via INTRAMUSCULAR

## 2014-03-23 MED ORDER — FLUCONAZOLE 100 MG PO TABS: 100.0000 mg | ORAL_TABLET | Freq: Every day | ORAL | Status: DC

## 2014-03-23 MED ORDER — AMOXICILLIN-POT CLAVULANATE 875-125 MG PO TABS: 1.0000 | ORAL_TABLET | Freq: Two times a day (BID) | ORAL | Status: DC

## 2014-03-23 NOTE — Patient Instructions (Signed)
USe albuterol 2 puffs four times daily until symptoms resolve

## 2014-03-23 NOTE — Progress Notes (Signed)
   Subjective:    Patient ID: Crystal Wong, female    DOB: [birth year], 57 y.o.   MRN: [id number]  HPI  Patient is here today for head and chest congestion that started about 10 days ago.  She has productive cough and some wheezing. Hx asthma. Intermittent SOB. Used inhaler X 1. Not sure if she needs it. Mild in nature. Some yellow sputum.       Review of Systems  Constitutional: Negative for fever, chills, diaphoresis, appetite change, fatigue and unexpected weight change.  HENT: Negative for congestion, ear pain, hearing loss, postnasal drip, rhinorrhea, sneezing, sore throat and trouble swallowing.   Eyes: Negative for pain.  Respiratory:       See HPI  Cardiovascular: Negative for chest pain and palpitations.  Gastrointestinal: Negative for nausea, vomiting, abdominal pain, diarrhea and constipation.  Genitourinary: Negative for dysuria, frequency and menstrual problem.  Musculoskeletal: Negative for joint swelling and arthralgias.  Skin: Negative for rash.  Neurological: Negative for dizziness, weakness, numbness and headaches.  Psychiatric/Behavioral: Negative for dysphoric mood and agitation.       Objective:   Physical Exam  Constitutional: She is oriented to person, place, and time. She appears well-developed and well-nourished. No distress.  HENT:  Head: Normocephalic and atraumatic.  Right Ear: External ear normal.  Left Ear: External ear normal.  Nose: Nose normal.  Mouth/Throat: Oropharynx is clear and moist.  Eyes: Conjunctivae and EOM are normal. Pupils are equal, round, and reactive to light.  Neck: Normal range of motion. Neck supple. No thyromegaly present.  Cardiovascular: Normal rate, regular rhythm and normal heart sounds.   No murmur heard. Pulmonary/Chest: Effort normal. No respiratory distress. She has wheezes. She has no rales. She exhibits no tenderness.  Few scattered coarse rhonchi noted as well  Abdominal: Soft. Bowel sounds are normal. She  exhibits no distension. There is no tenderness.  Lymphadenopathy:    She has no cervical adenopathy.  Neurological: She is alert and oriented to person, place, and time. She has normal reflexes.  Skin: Skin is warm and dry.  Psychiatric: She has a normal mood and affect. Her behavior is normal. Judgment and thought content normal.   BP 118/72 mmHg  Pulse 77  Temp(Src) 97.5 F (36.4 C) (Oral)  Ht 5' 6.5" (1.689 m)  Wt 204 lb 12.8 oz (92.897 kg)  BMI 32.56 kg/m2        Assessment & Plan:  Asthmatic bronchitis, mild persistent, with acute exacerbation - Plan: betamethasone acetate-betamethasone sodium phosphate (CELESTONE) injection 12 mg  Acute bronchitis, unspecified organism  Augmentin 875/125 daily for 10 days

## 2014-03-24 ENCOUNTER — Other Ambulatory Visit: Payer: Self-pay | Admitting: *Deleted

## 2014-03-24 NOTE — Telephone Encounter (Signed)
Last filled 12/3

## 2014-03-24 NOTE — Telephone Encounter (Signed)
Please call in ambien with 1 refills 

## 2014-03-24 NOTE — Telephone Encounter (Signed)
Ambien called to [facility].

## 2014-05-07 ENCOUNTER — Other Ambulatory Visit: Payer: Self-pay | Admitting: Nurse Practitioner

## 2014-05-07 NOTE — Telephone Encounter (Signed)
Left auth on voicemail

## 2014-05-07 NOTE — Telephone Encounter (Signed)
Please call in ativan with 1 refills 

## 2014-05-07 NOTE — Telephone Encounter (Signed)
Last filled 03/07/14, last seen 12/23/13. Call in to [facility]

## 2014-05-29 ENCOUNTER — Other Ambulatory Visit: Payer: Self-pay | Admitting: Nurse Practitioner

## 2014-06-01 NOTE — Telephone Encounter (Signed)
Last seen 03/23/14 Dr Livia Snellen

## 2014-06-17 ENCOUNTER — Other Ambulatory Visit: Payer: Self-pay | Admitting: Nurse Practitioner

## 2014-06-22 ENCOUNTER — Telehealth: Payer: Self-pay | Admitting: Nurse Practitioner

## 2014-06-22 MED ORDER — LORATADINE-PSEUDOEPHEDRINE ER 10-240 MG PO TB24: 1.0000 | ORAL_TABLET | Freq: Every day | ORAL | Status: DC

## 2014-06-22 MED ORDER — LORATADINE-PSEUDOEPHEDRINE ER 5-120 MG PO TB12: 1.0000 | ORAL_TABLET | Freq: Two times a day (BID) | ORAL | Status: DC

## 2014-06-22 NOTE — Telephone Encounter (Signed)
I sent the Rx to the pharmacy.

## 2014-06-22 NOTE — Telephone Encounter (Signed)
rx has been fixed.

## 2014-06-22 NOTE — Telephone Encounter (Signed)
Patient aware.

## 2014-06-24 ENCOUNTER — Other Ambulatory Visit: Payer: Self-pay | Admitting: Nurse Practitioner

## 2014-06-25 NOTE — Telephone Encounter (Signed)
Last seen 03/23/14 Dr Livia Snellen  If approved route to nurse to call into [facility]

## 2014-07-01 ENCOUNTER — Ambulatory Visit (INDEPENDENT_AMBULATORY_CARE_PROVIDER_SITE_OTHER): Payer: No Typology Code available for payment source | Admitting: Nurse Practitioner

## 2014-07-01 ENCOUNTER — Encounter: Payer: Self-pay | Admitting: Nurse Practitioner

## 2014-07-01 ENCOUNTER — Telehealth: Payer: Self-pay | Admitting: *Deleted

## 2014-07-01 VITALS — BP 130/84 | HR 86 | Temp 97.3°F | Ht 66.0 in | Wt 210.0 lb

## 2014-07-01 DIAGNOSIS — J302 Other seasonal allergic rhinitis: Secondary | ICD-10-CM | POA: Diagnosis not present

## 2014-07-01 DIAGNOSIS — J0101 Acute recurrent maxillary sinusitis: Secondary | ICD-10-CM | POA: Diagnosis not present

## 2014-07-01 MED ORDER — AZITHROMYCIN 250 MG PO TABS: ORAL_TABLET | ORAL | Status: DC

## 2014-07-01 MED ORDER — FLUTICASONE PROPIONATE 50 MCG/ACT NA SUSP: 2.0000 | Freq: Every day | NASAL | Status: DC

## 2014-07-01 MED ORDER — FLUCONAZOLE 150 MG PO TABS: 150.0000 mg | ORAL_TABLET | Freq: Once | ORAL | Status: DC

## 2014-07-01 MED ORDER — ZOLPIDEM TARTRATE 10 MG PO TABS: 10.0000 mg | ORAL_TABLET | Freq: Every evening | ORAL | Status: DC | PRN

## 2014-07-01 NOTE — Patient Instructions (Signed)

## 2014-07-01 NOTE — Addendum Note (Signed)
Addended by: Chevis Pretty on: 07/01/2014 11:18 AM   Modules accepted: Orders

## 2014-07-01 NOTE — Telephone Encounter (Signed)
Pt requesting refill on Ambien  Okayed per [place] into [facility]

## 2014-07-01 NOTE — Progress Notes (Signed)
  Subjective:     Crystal Wong is a 59 y.o. female who presents for evaluation of sinus pain. Symptoms include: clear rhinorrhea, congestion, headaches, itchy eyes, sinus pressure and sore throat. Onset of symptoms was 6 days ago. Symptoms have been gradually worsening since that time. Past history is significant for no history of pneumonia or bronchitis. Patient is a non-smoker.  The following portions of the patient's history were reviewed and updated as appropriate: allergies, current medications, past family history, past medical history, past social history, past surgical history and problem list.  Review of Systems Pertinent items are noted in HPI.   Objective:    BP 130/84 mmHg  Pulse 86  Temp(Src) 97.3 F (36.3 C) (Oral)  Ht 5\' 6"  (1.676 m)  Wt 210 lb (95.255 kg)  BMI 33.91 kg/m2 General appearance: alert and cooperative Eyes: conjunctivae/corneas clear. PERRL, EOM's intact. Fundi benign. Ears: normal TM's and external ear canals both ears Nose: Nares normal. Septum midline. Mucosa normal. No drainage or sinus tenderness., clear discharge, moderate congestion, turbinates red, sinus tenderness bilateral Throat: lips, mucosa, and tongue normal; teeth and gums normal Neck: no adenopathy, no carotid bruit, no JVD, supple, symmetrical, trachea midline and thyroid not enlarged, symmetric, no tenderness/mass/nodules Lungs: clear to auscultation bilaterally Heart: regular rate and rhythm, S1, S2 normal, no murmur, click, rub or gallop    Assessment:    Acute bacterial sinusitis.    Plan:  1. Take meds as prescribed 2. Use a cool mist humidifier especially during the winter months and when heat has been humid. 3. Use saline nose sprays frequently 4. Saline irrigations of the nose can be very helpful if done frequently.  * 4X daily for 1 week*  * Use of a nettie pot can be helpful with this. Follow directions with this* 5. Drink plenty of fluids 6. Keep thermostat turn down  low 7.For any cough or congestion  Use plain Mucinex- regular strength or max strength is fine   * Children- consult with Pharmacist for dosing 8. For fever or aces or pains- take tylenol or ibuprofen appropriate for age and weight.  * for fevers greater than 101 orally you may alternate ibuprofen and tylenol every  3 hours.   Meds ordered this encounter  Medications  . azithromycin (ZITHROMAX) 250 MG tablet    Sig: Two tablets day one, then one tablet daily next 4 days.    Dispense:  6 tablet    Refill:  0    Order Specific Question:  Supervising Provider    Answer:  Chipper Herb [[id number]]  . fluticasone (FLONASE) 50 MCG/ACT nasal spray    Sig: Place 2 sprays into both nostrils daily.    Dispense:  16 g    Refill:  6    Order Specific Question:  Supervising Provider    Answer:  Chipper Herb [[id number]]   [name] Done, FNP

## 2014-07-15 ENCOUNTER — Other Ambulatory Visit: Payer: Self-pay | Admitting: Nurse Practitioner

## 2014-07-27 ENCOUNTER — Ambulatory Visit (INDEPENDENT_AMBULATORY_CARE_PROVIDER_SITE_OTHER): Payer: No Typology Code available for payment source | Admitting: Family Medicine

## 2014-07-27 ENCOUNTER — Other Ambulatory Visit: Payer: Self-pay | Admitting: Nurse Practitioner

## 2014-07-27 ENCOUNTER — Encounter: Payer: Self-pay | Admitting: Family Medicine

## 2014-07-27 VITALS — BP 133/81 | HR 85 | Temp 96.9°F | Ht 66.0 in | Wt 206.2 lb

## 2014-07-27 DIAGNOSIS — K2100 Gastro-esophageal reflux disease with esophagitis, without bleeding: Secondary | ICD-10-CM

## 2014-07-27 DIAGNOSIS — R0789 Other chest pain: Secondary | ICD-10-CM | POA: Diagnosis not present

## 2014-07-27 MED ORDER — PANTOPRAZOLE SODIUM 40 MG PO TBEC: 40.0000 mg | DELAYED_RELEASE_TABLET | Freq: Every day | ORAL | Status: DC

## 2014-07-27 NOTE — Patient Instructions (Addendum)
Use 1 tablespoon twice daily of Metamucil to prevent and treat constipation. If it occurs in spite of this you may use MiraLAX 1 capful daily as needed for resistant constipation  After a few days on the pantoprazole U should be able to discontinue the ranitidine and over-the-counter Tums etc. If you do not get adequate relief, GI referral for endoscopy will be the next step.

## 2014-07-27 NOTE — Progress Notes (Signed)
Subjective:  Patient ID: Crystal Wong, female    DOB: 08-03-1955  Age: 59 y.o. MRN: [id number]  CC: Chest Pain   HPI Crystal Wong presents for onset 8 days ago of chest pain. This is limited to the substernal region. It occurs several times daily and lasts for several minutes each time. It does not radiate. It is a fluttering like there is something in there she points to the epigastrium to substernal region and the lower anterior chest. This is always the location. It does not burn it does not feel like pressure. It is not associated with nausea. It is not associated with diaphoresis or shortness of breath. She has no history of coronary disease or excessive risk factors. Although she is treated for cholesterol that is under good control.   History Crystal Wong has no past medical history on file.   She has no past surgical history on file.   Her family history is not on file.She reports that she has never smoked. She does not have any smokeless tobacco history on file. She reports that she does not drink alcohol or use illicit drugs.  Outpatient Prescriptions Prior to Visit  Medication Sig Dispense Refill  . albuterol (PROVENTIL HFA;VENTOLIN HFA) 108 (90 BASE) MCG/ACT inhaler Inhale 2 puffs into the lungs every 6 (six) hours as needed for wheezing. 1 Inhaler 1  . citalopram (CELEXA) 40 MG tablet TAKE ONE TABLET BY MOUTH ONE TIME DAILY 30 tablet 1  . CRESTOR 40 MG tablet TAKE ONE TABLET BY MOUTH ONE TIME DAILY 30 tablet 0  . fluticasone (FLONASE) 50 MCG/ACT nasal spray Place 2 sprays into both nostrils daily. 16 g 6  . loratadine-pseudoephedrine (CLARITIN-D 24 HOUR) 10-240 MG per 24 hr tablet Take 1 tablet by mouth daily. 90 tablet 1  . LORazepam (ATIVAN) 1 MG tablet TAKE  ONE TABLET BY MOUTH EVERY 8 HOURS AS NEEDED 30 tablet 0  . mometasone-formoterol (DULERA) 100-5 MCG/ACT AERO Inhale 1 puff into the lungs once. 1 Inhaler 1  . ranitidine (ZANTAC) 150 MG tablet Take 150 mg by mouth 2 (two)  times daily.    Marland Kitchen zolpidem (AMBIEN) 10 MG tablet Take 1 tablet (10 mg total) by mouth at bedtime as needed. for sleep 30 tablet 1  . azithromycin (ZITHROMAX) 250 MG tablet Two tablets day one, then one tablet daily next 4 days. (Patient not taking: Reported on 07/27/2014) 6 tablet 0  . fluconazole (DIFLUCAN) 150 MG tablet Take 1 tablet (150 mg total) by mouth once. (Patient not taking: Reported on 07/27/2014) 1 tablet 0   No facility-administered medications prior to visit.    ROS Review of Systems  Constitutional: Negative for fever, chills, diaphoresis, appetite change, fatigue and unexpected weight change.  HENT: Negative for congestion, ear pain, hearing loss, postnasal drip, rhinorrhea, sneezing, sore throat and trouble swallowing.   Eyes: Negative for pain.  Respiratory: Negative for cough, chest tightness and shortness of breath.   Cardiovascular: Positive for chest pain. Negative for palpitations.  Gastrointestinal: Negative for nausea, vomiting, abdominal pain, diarrhea and constipation.  Genitourinary: Negative for dysuria, frequency and menstrual problem.  Musculoskeletal: Negative for joint swelling and arthralgias.  Skin: Negative for rash.  Neurological: Negative for dizziness, weakness, numbness and headaches.  Psychiatric/Behavioral: Negative for dysphoric mood and agitation.    Objective:  BP 133/81 mmHg  Pulse 85  Temp(Src) 96.9 F (36.1 C) (Oral)  Ht 5\' 6"  (1.676 m)  Wt 206 lb 3.2 oz (93.532 kg)  BMI  33.30 kg/m2  BP Readings from Last 3 Encounters:  07/27/14 133/81  07/01/14 130/84  03/23/14 118/72    Wt Readings from Last 3 Encounters:  07/27/14 206 lb 3.2 oz (93.532 kg)  07/01/14 210 lb (95.255 kg)  03/23/14 204 lb 12.8 oz (92.897 kg)     Physical Exam  Constitutional: She is oriented to person, place, and time. She appears well-developed and well-nourished. No distress.  HENT:  Head: Normocephalic and atraumatic.  Right Ear: External ear normal.    Left Ear: External ear normal.  Nose: Nose normal.  Mouth/Throat: Oropharynx is clear and moist.  Eyes: Conjunctivae and EOM are normal. Pupils are equal, round, and reactive to light.  Neck: Normal range of motion. Neck supple. No thyromegaly present.  Cardiovascular: Normal rate, regular rhythm and normal heart sounds.   No murmur heard. Pulmonary/Chest: Effort normal and breath sounds normal. No respiratory distress. She has no wheezes. She has no rales.  Abdominal: Soft. Bowel sounds are normal. She exhibits no distension. There is no tenderness.  Lymphadenopathy:    She has no cervical adenopathy.  Neurological: She is alert and oriented to person, place, and time. She has normal reflexes.  Skin: Skin is warm and dry.  Psychiatric: She has a normal mood and affect. Her behavior is normal. Judgment and thought content normal.    No results found for: HGBA1C  Lab Results  Component Value Date   WBC 8.4 02/23/2014   HGB 13.6 02/23/2014   HCT 41.4 02/23/2014   GLUCOSE 103* 12/23/2013   CHOL 246* 12/23/2013   TRIG 125 12/23/2013   HDL 58 12/23/2013   LDLCALC 163* 12/23/2013   ALT 21 12/23/2013   AST 21 12/23/2013   NA 137 12/23/2013   K 4.3 12/23/2013   CL 98 12/23/2013   CREATININE 0.88 12/23/2013   BUN 14 12/23/2013   CO2 23 12/23/2013   TSH 1.380 12/23/2013    No results found.  Assessment & Plan:   Crystal Wong was seen today for chest pain.  Diagnoses and all orders for this visit:  Chest tightness or pressure Orders: -     EKG 12-Lead  Gastroesophageal reflux disease with esophagitis  Other orders -     Discontinue: pantoprazole (PROTONIX) 40 MG tablet; Take 1 tablet (40 mg total) by mouth daily. -     pantoprazole (PROTONIX) 40 MG tablet; Take 1 tablet (40 mg total) by mouth daily.   I have discontinued Ms. Fukuhara's azithromycin and fluconazole. I am also having her maintain her ranitidine, mometasone-formoterol, albuterol, LORazepam,  loratadine-pseudoephedrine, fluticasone, zolpidem, CRESTOR, citalopram, and pantoprazole.  Meds ordered this encounter  Medications  . DISCONTD: pantoprazole (PROTONIX) 40 MG tablet    Sig: Take 1 tablet (40 mg total) by mouth daily.    Dispense:  30 tablet    Refill:  3  . pantoprazole (PROTONIX) 40 MG tablet    Sig: Take 1 tablet (40 mg total) by mouth daily.    Dispense:  30 tablet    Refill:  3     Follow-up: Return in about 1 month (around 08/27/2014), or if symptoms worsen or fail to improve.  Claretta Fraise, M.D.

## 2014-08-17 ENCOUNTER — Telehealth: Payer: Self-pay | Admitting: Family Medicine

## 2014-08-17 ENCOUNTER — Other Ambulatory Visit: Payer: Self-pay | Admitting: Nurse Practitioner

## 2014-08-17 DIAGNOSIS — K219 Gastro-esophageal reflux disease without esophagitis: Secondary | ICD-10-CM

## 2014-08-17 NOTE — Telephone Encounter (Signed)
lmovm that referral has been placed and our office will call her when the appt has been made

## 2014-08-17 NOTE — Telephone Encounter (Signed)
Use for referral [facility] [place] [place] section. I Prefer Dr. Joaquim Lai or Dr. Janus Molder

## 2014-08-19 ENCOUNTER — Other Ambulatory Visit: Payer: Self-pay | Admitting: Nurse Practitioner

## 2014-08-19 NOTE — Telephone Encounter (Signed)
Last seen 07/27/14 Dr Livia Snellen   If approved route to  Nurse to call into [facility]

## 2014-08-19 NOTE — Telephone Encounter (Signed)
Please review and advise.

## 2014-08-19 NOTE — Telephone Encounter (Signed)
RX for Ativan called into [facility] per [employer]

## 2014-09-26 ENCOUNTER — Ambulatory Visit (INDEPENDENT_AMBULATORY_CARE_PROVIDER_SITE_OTHER): Payer: 59 | Admitting: Family

## 2014-09-26 ENCOUNTER — Encounter: Payer: Self-pay | Admitting: Family

## 2014-09-26 VITALS — BP 141/86 | HR 76 | Temp 97.7°F | Ht 66.0 in | Wt 210.0 lb

## 2014-09-26 DIAGNOSIS — J0110 Acute frontal sinusitis, unspecified: Secondary | ICD-10-CM

## 2014-09-26 MED ORDER — AMOXICILLIN-POT CLAVULANATE 875-125 MG PO TABS
1.0000 | ORAL_TABLET | Freq: Two times a day (BID) | ORAL | Status: DC
Start: 2014-09-26 — End: 2014-10-13

## 2014-09-26 NOTE — Patient Instructions (Signed)
Sinusitis Sinusitis is redness, soreness, and inflammation of the paranasal sinuses. Paranasal sinuses are air pockets within the bones of your face (beneath the eyes, the middle of the forehead, or above the eyes). In healthy paranasal sinuses, mucus is able to drain out, and air is able to circulate through them by way of your nose. However, when your paranasal sinuses are inflamed, mucus and air can become trapped. This can allow bacteria and other germs to grow and cause infection. Sinusitis can develop quickly and last only a short time (acute) or continue over a long period (chronic). Sinusitis that lasts for more than 12 weeks is considered chronic.  CAUSES  Causes of sinusitis include:  Allergies.  Structural abnormalities, such as displacement of the cartilage that separates your nostrils (deviated septum), which can decrease the air flow through your nose and sinuses and affect sinus drainage.  Functional abnormalities, such as when the small hairs (cilia) that line your sinuses and help remove mucus do not work properly or are not present. SIGNS AND SYMPTOMS  Symptoms of acute and chronic sinusitis are the same. The primary symptoms are pain and pressure around the affected sinuses. Other symptoms include:  Upper toothache.  Earache.  Headache.  Bad breath.  Decreased sense of smell and taste.  A cough, which worsens when you are lying flat.  Fatigue.  Fever.  Thick drainage from your nose, which often is green and may contain pus (purulent).  Swelling and warmth over the affected sinuses. DIAGNOSIS  Your health care provider will perform a physical exam. During the exam, your health care provider may:  Look in your nose for signs of abnormal growths in your nostrils (nasal polyps).  Tap over the affected sinus to check for signs of infection.  View the inside of your sinuses (endoscopy) using an imaging device that has a light attached (endoscope). If your health  care provider suspects that you have chronic sinusitis, one or more of the following tests may be recommended:  Allergy tests.  Nasal culture. A sample of mucus is taken from your nose, sent to a lab, and screened for bacteria.  Nasal cytology. A sample of mucus is taken from your nose and examined by your health care provider to determine if your sinusitis is related to an allergy. TREATMENT  Most cases of acute sinusitis are related to a viral infection and will resolve on their own within 10 days. Sometimes medicines are prescribed to help relieve symptoms (pain medicine, decongestants, nasal steroid sprays, or saline sprays).  However, for sinusitis related to a bacterial infection, your health care provider will prescribe antibiotic medicines. These are medicines that will help kill the bacteria causing the infection.  Rarely, sinusitis is caused by a fungal infection. In theses cases, your health care provider will prescribe antifungal medicine. For some cases of chronic sinusitis, surgery is needed. Generally, these are cases in which sinusitis recurs more than 3 times per year, despite other treatments. HOME CARE INSTRUCTIONS   Drink plenty of water. Water helps thin the mucus so your sinuses can drain more easily.  Use a humidifier.  Inhale steam 3 to 4 times a day (for example, sit in the bathroom with the shower running).  Apply a warm, moist washcloth to your face 3 to 4 times a day, or as directed by your health care provider.  Use saline nasal sprays to help moisten and clean your sinuses.  Take medicines only as directed by your health care provider.    If you were prescribed either an antibiotic or antifungal medicine, finish it all even if you start to feel better. SEEK IMMEDIATE MEDICAL CARE IF:  You have increasing pain or severe headaches.  You have nausea, vomiting, or drowsiness.  You have swelling around your face.  You have vision problems.  You have a stiff  neck.  You have difficulty breathing. MAKE SURE YOU:   Understand these instructions.  Will watch your condition.  Will get help right away if you are not doing well or get worse. Document Released: 02/20/2005 Document Revised: 07/07/2013 Document Reviewed: 03/07/2011 ExitCare Patient Information 2015 [employer]. This information is not intended to replace advice given to you by your health care provider. Make sure you discuss any questions you have with your health care provider.  - Take meds as prescribed - Use a cool mist humidifier  -Use saline nose sprays frequently -Saline irrigations of the nose can be very helpful if done frequently.  * 4X daily for 1 week*  * Use of a nettie pot can be helpful with this. Follow directions with this* -Force fluids -For any cough or congestion  Use plain Mucinex- regular strength or max strength is fine   * Children- consult with Pharmacist for dosing -For fever or aces or pains- take tylenol or ibuprofen appropriate for age and weight.  * for fevers greater than 101 orally you may alternate ibuprofen and tylenol every  3 hours. -Throat lozenges if help   Irfan Veal, FNP  

## 2014-09-26 NOTE — Progress Notes (Signed)
   Subjective:    Patient ID: Crystal Wong, female    DOB: Jul 31, 1955, 59 y.o.   MRN: [id number]  Otalgia  There is pain in the right ear. This is a new problem. Episode onset: Wednesday. The problem occurs constantly. The problem has been gradually worsening. There has been no fever. The pain is at a severity of 8/10. The pain is mild. Associated symptoms include coughing, hearing loss, rhinorrhea and a sore throat. Pertinent negatives include no diarrhea, ear discharge or headaches. She has tried acetaminophen (flonase, claritin) for the symptoms. The treatment provided mild relief.      Review of Systems  Constitutional: Negative.   HENT: Positive for ear pain, hearing loss, rhinorrhea and sore throat. Negative for ear discharge.   Eyes: Negative.   Respiratory: Positive for cough. Negative for shortness of breath.   Cardiovascular: Negative.  Negative for palpitations.  Gastrointestinal: Negative.  Negative for diarrhea.  Endocrine: Negative.   Genitourinary: Negative.   Musculoskeletal: Negative.   Neurological: Negative.  Negative for headaches.  Hematological: Negative.   Psychiatric/Behavioral: Negative.   All other systems reviewed and are negative.      Objective:   Physical Exam  Constitutional: She is oriented to person, place, and time. She appears well-developed and well-nourished. No distress.  HENT:  Head: Normocephalic and atraumatic.  Right Ear: There is tenderness. A middle ear effusion is present.  Nose: Right sinus exhibits frontal sinus tenderness. Left sinus exhibits frontal sinus tenderness.  Mouth/Throat: Oropharynx is clear and moist.  Eyes: Pupils are equal, round, and reactive to light.  Neck: Normal range of motion. Neck supple. No thyromegaly present.  Cardiovascular: Normal rate, regular rhythm, normal heart sounds and intact distal pulses.   No murmur heard. Pulmonary/Chest: Effort normal and breath sounds normal. No respiratory distress. She has  no wheezes.  Abdominal: Soft. Bowel sounds are normal. She exhibits no distension. There is no tenderness.  Musculoskeletal: Normal range of motion. She exhibits no edema or tenderness.  Neurological: She is alert and oriented to person, place, and time. She has normal reflexes. No cranial nerve deficit.  Skin: Skin is warm and dry.  Psychiatric: She has a normal mood and affect. Her behavior is normal. Judgment and thought content normal.  Vitals reviewed.     BP 141/86 mmHg  Pulse 76  Temp(Src) 97.7 F (36.5 C) (Oral)  Ht 5\' 6"  (1.676 m)  Wt 210 lb (95.255 kg)  BMI 33.91 kg/m2     Assessment & Plan:  1. Acute frontal sinusitis, recurrence not specified -- Take meds as prescribed - Use a cool mist humidifier  -Use saline nose sprays frequently -Saline irrigations of the nose can be very helpful if done frequently.  * 4X daily for 1 week*  * Use of a nettie pot can be helpful with this. Follow directions with this* -Force fluids -For any cough or congestion  Use plain Mucinex- regular strength or max strength is fine   * Children- consult with Pharmacist for dosing -For fever or aces or pains- take tylenol or ibuprofen appropriate for age and weight.  * for fevers greater than 101 orally you may alternate ibuprofen and tylenol every  3 hours. -Throat lozenges if help - amoxicillin-clavulanate (AUGMENTIN) 875-125 MG per tablet; Take 1 tablet by mouth 2 (two) times daily.  Dispense: 14 tablet; Refill: 0   Evelina Dun, FNP

## 2014-09-28 ENCOUNTER — Other Ambulatory Visit: Payer: Self-pay | Admitting: Nurse Practitioner

## 2014-09-28 NOTE — Telephone Encounter (Signed)
Refill called to [facility] 

## 2014-09-28 NOTE — Telephone Encounter (Signed)
Please call in ambien with 1 refills 

## 2014-09-29 ENCOUNTER — Other Ambulatory Visit: Payer: Self-pay | Admitting: Family Medicine

## 2014-10-02 ENCOUNTER — Telehealth: Payer: Self-pay | Admitting: Family

## 2014-10-02 ENCOUNTER — Encounter: Payer: Self-pay | Admitting: Nurse Practitioner

## 2014-10-02 ENCOUNTER — Ambulatory Visit (INDEPENDENT_AMBULATORY_CARE_PROVIDER_SITE_OTHER): Payer: 59 | Admitting: Nurse Practitioner

## 2014-10-02 VITALS — BP 122/76 | HR 74 | Temp 98.1°F | Ht 66.0 in | Wt 210.0 lb

## 2014-10-02 DIAGNOSIS — H9201 Otalgia, right ear: Secondary | ICD-10-CM

## 2014-10-02 DIAGNOSIS — F419 Anxiety disorder, unspecified: Secondary | ICD-10-CM | POA: Insufficient documentation

## 2014-10-02 NOTE — Progress Notes (Signed)
   Subjective:    Patient ID: Crystal Wong, female    DOB: 05/13/1955, 59 y.o.   MRN: [id number]  HPI Patient in c/o right ear pain- saw C. Hawks last Saturday with same complaint- was put on antibiotic but today pain started back worse than it was last week. No drianage- no fever.    Review of Systems  Constitutional: Negative.   HENT: Positive for ear pain (right).   Respiratory: Negative.   Cardiovascular: Negative.   Gastrointestinal: Negative.   Genitourinary: Negative.   Neurological: Negative.   Psychiatric/Behavioral: Negative.        Objective:   Physical Exam  Constitutional: She is oriented to person, place, and time. She appears well-developed and well-nourished.  HENT:  Right Ear: Hearing and external ear normal. A middle ear effusion (clear) is present.  Left Ear: Hearing, tympanic membrane, external ear and ear canal normal.  Nose: Mucosal edema and rhinorrhea present. Right sinus exhibits no maxillary sinus tenderness and no frontal sinus tenderness. Left sinus exhibits no maxillary sinus tenderness and no frontal sinus tenderness.  Mouth/Throat: Uvula is midline, oropharynx is clear and moist and mucous membranes are normal.  Neck: Normal range of motion.  Cardiovascular: Normal rate, regular rhythm and normal heart sounds.   Pulmonary/Chest: Effort normal and breath sounds normal.  Abdominal: Soft. Bowel sounds are normal.  Neurological: She is alert and oriented to person, place, and time.  Skin: Skin is warm.  Psychiatric: She has a normal mood and affect. Her behavior is normal. Thought content normal.   BP 122/76 mmHg  Pulse 74  Temp(Src) 98.1 F (36.7 C) (Oral)  Ht 5\' 6"  (1.676 m)  Wt 210 lb (95.255 kg)  BMI 33.91 kg/m2        Assessment & Plan:   1. Otalgia of right ear    Rest  Motrin otc  flonase and decongestant RTO prn  [name] Done, FNP

## 2014-10-02 NOTE — Patient Instructions (Signed)
Otalgia  The most common reason for this in children is an infection of the middle ear. Pain from the middle ear is usually caused by a build-up of fluid and pressure behind the eardrum. Pain from an earache can be sharp, dull, or burning. The pain may be temporary or constant. The middle ear is connected to the nasal passages by a short narrow tube called the Eustachian tube. The Eustachian tube allows fluid to drain out of the middle ear, and helps keep the pressure in your ear equalized.  CAUSES   A cold or allergy can block the Eustachian tube with inflammation and the build-up of secretions. This is especially likely in small children, because their Eustachian tube is shorter and more horizontal. When the Eustachian tube closes, the normal flow of fluid from the middle ear is stopped. Fluid can accumulate and cause stuffiness, pain, hearing loss, and an ear infection if germs start growing in this area.  SYMPTOMS   The symptoms of an ear infection may include fever, ear pain, fussiness, increased crying, and irritability. Many children will have temporary and minor hearing loss during and right after an ear infection. Permanent hearing loss is rare, but the risk increases the more infections a child has. Other causes of ear pain include retained water in the outer ear canal from swimming and bathing.  Ear pain in adults is less likely to be from an ear infection. Ear pain may be referred from other locations. Referred pain may be from the joint between your jaw and the skull. It may also come from a tooth problem or problems in the neck. Other causes of ear pain include:   A foreign body in the ear.   Outer ear infection.   Sinus infections.   Impacted ear wax.   Ear injury.   Arthritis of the jaw or TMJ problems.   Middle ear infection.   Tooth infections.   Sore throat with pain to the ears.  DIAGNOSIS   Your caregiver can usually make the diagnosis by examining you. Sometimes other special studies,  including x-rays and lab work may be necessary.  TREATMENT    If antibiotics were prescribed, use them as directed and finish them even if you or your child's symptoms seem to be improved.   Sometimes PE tubes are needed in children. These are little plastic tubes which are put into the eardrum during a simple surgical procedure. They allow fluid to drain easier and allow the pressure in the middle ear to equalize. This helps relieve the ear pain caused by pressure changes.  HOME CARE INSTRUCTIONS    Only take over-the-counter or prescription medicines for pain, discomfort, or fever as directed by your caregiver. DO NOT GIVE CHILDREN ASPIRIN because of the association of Reye's Syndrome in children taking aspirin.   Use a cold pack applied to the outer ear for 15-20 minutes, 03-04 times per day or as needed may reduce pain. Do not apply ice directly to the skin. You may cause frost bite.   Over-the-counter ear drops used as directed may be effective. Your caregiver may sometimes prescribe ear drops.   Resting in an upright position may help reduce pressure in the middle ear and relieve pain.   Ear pain caused by rapidly descending from high altitudes can be relieved by swallowing or chewing gum. Allowing infants to suck on a bottle during airplane travel can help.   Do not smoke in the house or near children. If you are   unable to quit smoking, smoke outside.   Control allergies.  SEEK IMMEDIATE MEDICAL CARE IF:    You or your child are becoming sicker.   Pain or fever relief is not obtained with medicine.   You or your child's symptoms (pain, fever, or irritability) do not improve within 24 to 48 hours or as instructed.   Severe pain suddenly stops hurting. This may indicate a ruptured eardrum.   You or your children develop new problems such as severe headaches, stiff neck, difficulty swallowing, or swelling of the face or around the ear.  Document Released: 10/08/2003 Document Revised: 05/15/2011  Document Reviewed: 02/12/2008  [employer] Patient Information 2015 [employer]. This information is not intended to replace advice given to you by your health care provider. Make sure you discuss any questions you have with your health care provider.

## 2014-10-13 ENCOUNTER — Encounter: Payer: Self-pay | Admitting: Family Medicine

## 2014-10-13 ENCOUNTER — Ambulatory Visit (INDEPENDENT_AMBULATORY_CARE_PROVIDER_SITE_OTHER): Payer: 59

## 2014-10-13 ENCOUNTER — Ambulatory Visit (INDEPENDENT_AMBULATORY_CARE_PROVIDER_SITE_OTHER): Payer: 59 | Admitting: Family Medicine

## 2014-10-13 VITALS — BP 132/85 | HR 77 | Temp 98.2°F | Ht 66.0 in | Wt 209.2 lb

## 2014-10-13 DIAGNOSIS — M25561 Pain in right knee: Secondary | ICD-10-CM | POA: Diagnosis not present

## 2014-10-13 NOTE — Assessment & Plan Note (Addendum)
R knee pain X 2 weeks, no trauma Likely meniscal injury Compression, ice Kenalog injection today, NSAIDs PRN (aleve)

## 2014-10-13 NOTE — Patient Instructions (Signed)
Great to meet you!  I hope you enjoy your vacation!  Knee Injection Joint injections are shots. Your caregiver will place a needle into your knee joint. The needle is used to put medicine into the joint. These shots can be used to help treat different painful knee conditions such as osteoarthritis, bursitis, local flare-ups of rheumatoid arthritis, and pseudogout. Anti-inflammatory medicines such as corticosteroids and anesthetics are the most common medicines used for joint and soft tissue injections.  PROCEDURE  The skin over the kneecap will be cleaned with an antiseptic solution.  Your caregiver will inject a small amount of a local anesthetic (a medicine like Novocaine) just under the skin in the area that was cleaned.  After the area becomes numb, a second injection is done. This second injection usually includes an anesthetic and an anti-inflammatory medicine called a steroid or cortisone. The needle is carefully placed in between the kneecap and the knee, and the medicine is injected into the joint space.  After the injection is done, the needle is removed. Your caregiver may place a bandage over the injection site. The whole procedure takes no more than a couple of minutes. BEFORE THE PROCEDURE  Wash all of the skin around the entire knee area. Try to remove any loose, scaling skin. There is no other specific preparation necessary unless advised otherwise by your caregiver. LET YOUR CAREGIVER KNOW ABOUT:   Allergies.  Medications taken including herbs, eye drops, over the counter medications, and creams.  Use of steroids (by mouth or creams).  Possible pregnancy, if applicable.  Previous problems with anesthetics or Novocaine.  History of blood clots (thrombophlebitis).  History of bleeding or blood problems.  Previous surgery.  Other health problems. RISKS AND COMPLICATIONS Side effects from cortisone shots are rare. They include:   Slight bruising of the  skin.  Shrinkage of the normal fatty tissue under the skin where the shot was given.  Increase in pain after the shot.  Infection.  Weakening of tendons or tendon rupture.  Allergic reaction to the medicine.  Diabetics may have a temporary increase in their blood sugar after a shot.  Cortisone can temporarily weaken the immune system. While receiving these shots, you should not get certain vaccines. Also, avoid contact with anyone who has chickenpox or measles. Especially if you have never had these diseases or have not been previously immunized. Your immune system may not be strong enough to fight off the infection while the cortisone is in your system. AFTER THE PROCEDURE   You can go home after the procedure.  You may need to put ice on the joint 15-20 minutes every 3 or 4 hours until the pain goes away.  You may need to put an elastic bandage on the joint. HOME CARE INSTRUCTIONS   Only take over-the-counter or prescription medicines for pain, discomfort, or fever as directed by your caregiver.  You should avoid stressing the joint. Unless advised otherwise, avoid activities that put a lot of pressure on a knee joint, such as:  Jogging.  Bicycling.  Recreational climbing.  Hiking.  Laying down and elevating the leg/knee above the level of your heart can help to minimize swelling. SEEK MEDICAL CARE IF:   You have repeated or worsening swelling.  There is drainage from the puncture area.  You develop red streaking that extends above or below the site where the needle was inserted. SEEK IMMEDIATE MEDICAL CARE IF:   You develop a fever.  You have pain that gets  worse even though you are taking pain medicine.  The area is red and warm, and you have trouble moving the joint. MAKE SURE YOU:   Understand these instructions.  Will watch your condition.  Will get help right away if you are not doing well or get worse. Document Released: 05/14/2006 Document Revised:  05/15/2011 Document Reviewed: 02/08/2007 [facility] Patient Information 2015 [place], [place]. This information is not intended to replace advice given to you by your health care provider. Make sure you discuss any questions you have with your health care provider.

## 2014-10-13 NOTE — Progress Notes (Signed)
Patient ID: Crystal Wong, female   DOB: Jul 24, 1955, 59 y.o.   MRN: [id number]   HPI  Patient presents today for right knee pain  Patient explains that the knee pain began about 2 weeks ago. She did not have an injury or traumatic event. She states that he began having right knee swelling and warmth but no redness. She describes it as medial right knee pain worse with use and were set into the day.  She denies fever, chills, sweats, injury. She denies injury or similar pain of the other knee. She is having some slight left knee pain that she feels this is likely from compensation.  She's going to go on a vacation this weekend and plans to do lots of walking several she requests steroid injection today.  PMH: Smoking status noted ROS: Per HPI  Objective: BP 132/85 mmHg  Pulse 77  Temp(Src) 98.2 F (36.8 C) (Oral)  Ht 5\' 6"  (1.676 m)  Wt 209 lb 3.2 oz (94.892 kg)  BMI 33.78 kg/m2 Gen: NAD, alert, cooperative with exam HEENT: NCAT Ext: No edema, warm Neuro: Alert and oriented, No gross deficits   Procedure, R knee injection Informed consent obtained and placed in chart.  Time out performed.  Area cleaned with iodine x 2 and wiped clear with alcohol swab.  Using 21 1/2 gauge needle 1 cc Kenalog and 4 cc's 1% marcaine were injected in the knee via the medial approach.  Sterile bandage placed.  Patient tolerated procedure well.  No complications.     Assessment and plan:  Right knee pain R knee pain X 2 weeks, no trauma Likely meniscal injury Compression, ice Kenalog injection today, NSAIDs PRN (aleve)     Orders Placed This Encounter  Procedures  . DG Knee 1-2 Views Right    Standing Status: Future     Number of Occurrences: 1     Standing Expiration Date: 12/13/2015    Scheduling Instructions:     Please include standing film    Order Specific Question:  Reason for Exam (SYMPTOM  OR DIAGNOSIS REQUIRED)    Answer:  knee pain, eval for OA    Order Specific Question:  Is  the patient pregnant?    Answer:  No    Order Specific Question:  Preferred imaging location?    Answer:  Internal   Laroy Apple, MD [facility] 10/13/2014, 9:26 AM

## 2014-10-26 ENCOUNTER — Other Ambulatory Visit: Payer: Self-pay | Admitting: Nurse Practitioner

## 2014-10-28 ENCOUNTER — Other Ambulatory Visit: Payer: Self-pay | Admitting: Family Medicine

## 2014-10-28 NOTE — Telephone Encounter (Signed)
Last seen 10/13/14  Dr Wendi Snipes

## 2014-11-10 ENCOUNTER — Encounter: Payer: Self-pay | Admitting: Physician Assistant

## 2014-11-10 ENCOUNTER — Ambulatory Visit (INDEPENDENT_AMBULATORY_CARE_PROVIDER_SITE_OTHER): Payer: 59 | Admitting: Physician Assistant

## 2014-11-10 VITALS — BP 135/81 | HR 76 | Temp 97.9°F | Ht 66.0 in | Wt 211.0 lb

## 2014-11-10 DIAGNOSIS — R399 Unspecified symptoms and signs involving the genitourinary system: Secondary | ICD-10-CM | POA: Diagnosis not present

## 2014-11-10 DIAGNOSIS — B379 Candidiasis, unspecified: Secondary | ICD-10-CM

## 2014-11-10 DIAGNOSIS — K219 Gastro-esophageal reflux disease without esophagitis: Secondary | ICD-10-CM

## 2014-11-10 DIAGNOSIS — N760 Acute vaginitis: Secondary | ICD-10-CM

## 2014-11-10 LAB — POCT UA - MICROSCOPIC ONLY
CASTS, UR, LPF, POC: NEGATIVE
CRYSTALS, UR, HPF, POC: NEGATIVE
Yeast, UA: NEGATIVE

## 2014-11-10 LAB — POCT WET PREP (WET MOUNT)

## 2014-11-10 LAB — POCT URINALYSIS DIPSTICK
BILIRUBIN UA: NEGATIVE
GLUCOSE UA: NEGATIVE
KETONES UA: NEGATIVE
Leukocytes, UA: NEGATIVE
Nitrite, UA: NEGATIVE
Protein, UA: NEGATIVE
RBC UA: NEGATIVE
SPEC GRAV UA: 1.01
UROBILINOGEN UA: NEGATIVE
pH, UA: 5

## 2014-11-10 MED ORDER — FLUCONAZOLE 150 MG PO TABS: ORAL_TABLET | ORAL | Status: DC

## 2014-11-10 MED ORDER — METRONIDAZOLE 500 MG PO TABS: 500.0000 mg | ORAL_TABLET | Freq: Three times a day (TID) | ORAL | Status: DC

## 2014-11-10 MED ORDER — PANTOPRAZOLE SODIUM 40 MG PO TBEC: 40.0000 mg | DELAYED_RELEASE_TABLET | Freq: Two times a day (BID) | ORAL | Status: DC

## 2014-11-10 NOTE — Patient Instructions (Signed)
Celexa - take 1/2 tablet x 1 week while taking Diflucan Protonix - begin with 1/2 of a 40mg  tablet twice daily. If not effective, take 1 40mg  tablet twice daily  Follow up with Gastroenterology

## 2014-11-10 NOTE — Progress Notes (Signed)
   Subjective:    Patient ID: Crystal Wong, female    DOB: 01-22-1956, 59 y.o.   MRN: [id number]  HPI 59 y/o female present with c/o vaginal discharge and irritation x 3 days   She also has c/o reflux. She has tried Ranitadine , then switched to Protonix, which helped at first but is no longer effective half way through the day.     Review of Systems  Constitutional: Negative.   HENT: Negative.   Eyes: Negative.   Respiratory: Negative.   Cardiovascular: Negative.   Gastrointestinal:       Reflux , heartburn   Genitourinary: Positive for vaginal discharge (pasty white discharge, thick ).  Musculoskeletal: Negative.        Objective:   Physical Exam  Constitutional: She appears well-developed and well-nourished.  Cardiovascular: Normal rate.   Abdominal: Soft. She exhibits no distension. There is no tenderness.  Nursing note and vitals reviewed.  Results for orders placed or performed in visit on 11/10/14  POCT UA - Microscopic Only  Result Value Ref Range   WBC, Ur, HPF, POC 1-5    RBC, urine, microscopic occ    Bacteria, U Microscopic occ    Mucus, UA occ    Epithelial cells, urine per micros occ    Crystals, Ur, HPF, POC neg    Casts, Ur, LPF, POC neg    Yeast, UA neg   POCT urinalysis dipstick  Result Value Ref Range   Color, UA gold    Clarity, UA clear    Glucose, UA neg    Bilirubin, UA neg    Ketones, UA neg    Spec Grav, UA 1.010    Blood, UA neg    pH, UA 5.0    Protein, UA neg    Urobilinogen, UA negative    Nitrite, UA neg    Leukocytes, UA Negative Negative  POCT Wet Prep ([place])  Result Value Ref Range   Source Wet Prep POC vaginal    WBC, Wet Prep HPF POC 1-5    Bacteria Wet Prep HPF POC Many (A) None, Few   Clue Cells Wet Prep HPF POC None None   Yeast Wet Prep HPF POC None    KOH Wet Prep POC     Trichomonas Wet Prep HPF POC none           Assessment & Plan:  1. UTI symptoms  - POCT UA - Microscopic Only - POCT urinalysis  dipstick - Urine culture  2. Vaginitis and vulvovaginitis  - POCT Wet Prep ([place]) - metroNIDAZOLE (FLAGYL) 500 MG tablet; Take 1 tablet (500 mg total) by mouth 3 (three) times daily.  Dispense: 21 tablet; Refill: 0  3. Candidiasis  - fluconazole (DIFLUCAN) 150 MG tablet; Take 1 tablet PO on day 1. Repeat in 3 days  Dispense: 3 tablet; Refill: 0  4. Gastroesophageal reflux disease, esophagitis presence not specified  - pantoprazole (PROTONIX) 40 MG tablet; Take 1 tablet (40 mg total) by mouth 2 (two) times daily.  Dispense: 60 tablet; Refill: 2 - Patient will follow up with GI   Continue all meds Labs pending Health Maintenance reviewed Diet and exercise encouraged   Tiffany A. Benjamin Stain PA-C

## 2014-11-11 ENCOUNTER — Other Ambulatory Visit: Payer: Self-pay | Admitting: Physician Assistant

## 2014-11-11 ENCOUNTER — Telehealth: Payer: Self-pay

## 2014-11-11 DIAGNOSIS — K219 Gastro-esophageal reflux disease without esophagitis: Secondary | ICD-10-CM

## 2014-11-11 NOTE — Telephone Encounter (Signed)
Referral ordered. Thanks Nyle Limb A. Benjamin Stain PA-C

## 2014-11-11 NOTE — Telephone Encounter (Signed)
Patient wants a referral to GI  In [facility] she talked to you about this

## 2014-11-12 LAB — URINE CULTURE

## 2014-11-12 NOTE — Telephone Encounter (Signed)
Patient aware that referral has been placed.  

## 2014-11-13 ENCOUNTER — Other Ambulatory Visit: Payer: Self-pay | Admitting: Nurse Practitioner

## 2014-11-13 DIAGNOSIS — G4700 Insomnia, unspecified: Secondary | ICD-10-CM

## 2014-11-13 NOTE — Telephone Encounter (Signed)
Last seen 11/10/14  [place]  If approved route to nurse to call into [facility]

## 2014-11-13 NOTE — Telephone Encounter (Signed)
Called in refill to [facility]

## 2014-12-14 ENCOUNTER — Other Ambulatory Visit: Payer: Self-pay | Admitting: Nurse Practitioner

## 2014-12-15 MED ORDER — HYDROCORTISONE ACETATE 25 MG RE SUPP: 25.0000 mg | Freq: Two times a day (BID) | RECTAL | Status: DC | PRN

## 2014-12-15 NOTE — Telephone Encounter (Signed)
I cant see that she has ever gotten steroid suppositories for hemmrhoids, Will send.   Laroy Apple, MD [facility] 12/15/2014, 7:54 AM

## 2014-12-15 NOTE — Telephone Encounter (Signed)
Patient aware that rx was sent over

## 2014-12-16 ENCOUNTER — Encounter: Payer: Self-pay | Admitting: Internal Medicine

## 2014-12-28 ENCOUNTER — Other Ambulatory Visit: Payer: Self-pay | Admitting: Pediatrics

## 2014-12-28 NOTE — Telephone Encounter (Signed)
Last seen 11/30/14  Crystal Wong   If approved route to nurse to call into [facility]

## 2014-12-30 NOTE — Telephone Encounter (Signed)
Please review and advisevinc

## 2014-12-31 NOTE — Telephone Encounter (Signed)
Left message on [facility] voicemail

## 2014-12-31 NOTE — Telephone Encounter (Signed)
OK to call in zolpidem 10mg  #30 tabs with no refills. She will need to come in and be seen before I am able to fill the Crystal Wong again because it has been over 6 months since we have seen her for her insomnia. Thanks, Arbie Cookey

## 2015-01-06 ENCOUNTER — Telehealth: Payer: Self-pay | Admitting: Family Medicine

## 2015-01-06 ENCOUNTER — Encounter: Payer: Self-pay | Admitting: Family Medicine

## 2015-01-06 ENCOUNTER — Ambulatory Visit (INDEPENDENT_AMBULATORY_CARE_PROVIDER_SITE_OTHER): Payer: 59 | Admitting: Family Medicine

## 2015-01-06 VITALS — BP 134/84 | HR 80 | Temp 98.1°F | Ht 66.0 in | Wt 211.0 lb

## 2015-01-06 DIAGNOSIS — J4521 Mild intermittent asthma with (acute) exacerbation: Secondary | ICD-10-CM

## 2015-01-06 DIAGNOSIS — J209 Acute bronchitis, unspecified: Secondary | ICD-10-CM | POA: Diagnosis not present

## 2015-01-06 MED ORDER — HYDROCODONE-HOMATROPINE 5-1.5 MG/5ML PO SYRP: 5.0000 mL | ORAL_SOLUTION | Freq: Three times a day (TID) | ORAL | Status: DC | PRN

## 2015-01-06 MED ORDER — ALBUTEROL SULFATE HFA 108 (90 BASE) MCG/ACT IN AERS: 2.0000 | INHALATION_SPRAY | Freq: Four times a day (QID) | RESPIRATORY_TRACT | Status: DC | PRN

## 2015-01-06 MED ORDER — AZITHROMYCIN 250 MG PO TABS: ORAL_TABLET | ORAL | Status: DC

## 2015-01-06 NOTE — Telephone Encounter (Signed)
Called back and made appt

## 2015-01-06 NOTE — Progress Notes (Signed)
   Subjective:    Patient ID: Crystal Wong, female    DOB: [birth year], 59 y.o.   MRN: [id number]  HPI 59 year old female with a 12 day history of cough congestion. She's not had any fever chills or myalgias. She does have a history of asthma. Her maintenance medicine is Dulera and rescue albuterol.  She feels like most of her congestion is in her chest rather than sinus/head  Patient Active Problem List   Diagnosis Date Noted  . Right knee pain 10/13/2014  . Anxiety   . Hyperlipidemia 08/23/2012  . Insomnia 08/23/2012  . GERD (gastroesophageal reflux disease) 08/23/2012  . Depression 08/23/2012  . GAD (generalized anxiety disorder) 08/23/2012   Outpatient Encounter Prescriptions as of 01/06/2015  Medication Sig  . albuterol (PROVENTIL HFA;VENTOLIN HFA) 108 (90 BASE) MCG/ACT inhaler Inhale 2 puffs into the lungs every 6 (six) hours as needed for wheezing.  . citalopram (CELEXA) 40 MG tablet TAKE ONE TABLET BY MOUTH ONE TIME DAILY  . CRESTOR 40 MG tablet TAKE ONE TABLET BY MOUTH ONE TIME DAILY  . fluconazole (DIFLUCAN) 150 MG tablet Take 1 tablet PO on day 1. Repeat in 3 days  . fluticasone (FLONASE) 50 MCG/ACT nasal spray Place 2 sprays into both nostrils daily.  Marland Kitchen loratadine-pseudoephedrine (CLARITIN-D 24 HOUR) 10-240 MG per 24 hr tablet Take 1 tablet by mouth daily.  Marland Kitchen LORazepam (ATIVAN) 1 MG tablet TAKE  ONE TABLET BY MOUTH EVERY 8 HOURS AS NEEDED  . mometasone-formoterol (DULERA) 100-5 MCG/ACT AERO Inhale 1 puff into the lungs once.  . pantoprazole (PROTONIX) 40 MG tablet Take 1 tablet (40 mg total) by mouth 2 (two) times daily.  Marland Kitchen zolpidem (AMBIEN) 10 MG tablet TAKE ONE TABLET BY MOUTH AT BEDTIME  . [DISCONTINUED] hydrocortisone (ANUSOL-HC) 25 MG suppository Place 1 suppository (25 mg total) rectally 2 (two) times daily as needed for hemorrhoids or itching.  . [DISCONTINUED] metroNIDAZOLE (FLAGYL) 500 MG tablet Take 1 tablet (500 mg total) by mouth 3 (three) times daily.   No  facility-administered encounter medications on file as of 01/06/2015.      Review of Systems  Constitutional: Positive for fatigue.  Respiratory: Positive for cough.   Cardiovascular: Negative.   Neurological: Negative.        Objective:   Physical Exam  Constitutional: She appears well-developed and well-nourished.  HENT:  Head: Normocephalic.  Right Ear: External ear normal.  Left Ear: External ear normal.  Nose: Nose normal.  Mouth/Throat: Oropharynx is clear and moist.  Cardiovascular: Normal rate, regular rhythm and normal heart sounds.   Pulmonary/Chest: Effort normal. No respiratory distress. She has no wheezes.          Assessment & Plan:  1. Asthma with acute exacerbation, mild intermittent Refill rescue medicine - albuterol (PROVENTIL HFA;VENTOLIN HFA) 108 (90 BASE) MCG/ACT inhaler; Inhale 2 puffs into the lungs every 6 (six) hours as needed for wheezing.  Dispense: 1 Inhaler; Refill: 1  2. Acute bronchitis, unspecified organism Rx for Z-Pak and Hycodan. Continue with Mucinex in the daytime  Wardell Honour MD

## 2015-01-12 ENCOUNTER — Other Ambulatory Visit: Payer: Self-pay | Admitting: Family

## 2015-01-13 NOTE — Telephone Encounter (Signed)
Last filled 10/27/14, last seen 01/06/15 by Sabra Heck. Call in at [facility]

## 2015-01-18 ENCOUNTER — Telehealth: Payer: Self-pay | Admitting: Family Medicine

## 2015-01-18 DIAGNOSIS — B379 Candidiasis, unspecified: Secondary | ICD-10-CM

## 2015-01-18 MED ORDER — FLUCONAZOLE 150 MG PO TABS: ORAL_TABLET | ORAL | Status: DC

## 2015-01-18 NOTE — Telephone Encounter (Signed)
Diflucan 150 number 2, take one today and one 1 week later

## 2015-01-29 ENCOUNTER — Other Ambulatory Visit: Payer: Self-pay | Admitting: Family Medicine

## 2015-02-10 ENCOUNTER — Other Ambulatory Visit: Payer: Self-pay | Admitting: Pediatrics

## 2015-02-10 NOTE — Telephone Encounter (Signed)
Last seen 01/06/15  Dr Sabra Heck   If approved route to nurse to call into [facility]

## 2015-02-11 ENCOUNTER — Ambulatory Visit: Payer: Self-pay | Admitting: Internal Medicine

## 2015-02-11 NOTE — Telephone Encounter (Signed)
Unsure from notes which prescription needs to be refill

## 2015-02-23 ENCOUNTER — Other Ambulatory Visit: Payer: Self-pay | Admitting: Family Medicine

## 2015-02-23 NOTE — Telephone Encounter (Signed)
Last lipid/liver 12/2013

## 2015-03-02 ENCOUNTER — Telehealth: Payer: Self-pay

## 2015-03-02 NOTE — Telephone Encounter (Signed)
Insurance prior authorized Crestor 40 mg

## 2015-03-29 ENCOUNTER — Other Ambulatory Visit: Payer: Self-pay | Admitting: Nurse Practitioner

## 2015-03-29 NOTE — Telephone Encounter (Signed)
Last lipid/liver 12/2013

## 2015-03-29 NOTE — Telephone Encounter (Signed)
crestor refill denied- NTBS

## 2015-03-30 ENCOUNTER — Telehealth: Payer: Self-pay | Admitting: Pediatrics

## 2015-03-30 MED ORDER — ROSUVASTATIN CALCIUM 40 MG PO TABS: 40.0000 mg | ORAL_TABLET | Freq: Every day | ORAL | Status: DC

## 2015-03-30 NOTE — Telephone Encounter (Signed)
done

## 2015-03-31 NOTE — Telephone Encounter (Signed)
Left message pt needs to be seen for refills

## 2015-04-15 ENCOUNTER — Ambulatory Visit (INDEPENDENT_AMBULATORY_CARE_PROVIDER_SITE_OTHER): Payer: 59 | Admitting: Pediatrics

## 2015-04-15 ENCOUNTER — Other Ambulatory Visit: Payer: Self-pay | Admitting: Pediatrics

## 2015-04-15 ENCOUNTER — Encounter: Payer: Self-pay | Admitting: Pediatrics

## 2015-04-15 VITALS — BP 125/77 | HR 87 | Temp 97.8°F | Ht 66.0 in | Wt 207.0 lb

## 2015-04-15 DIAGNOSIS — R739 Hyperglycemia, unspecified: Secondary | ICD-10-CM

## 2015-04-15 DIAGNOSIS — F329 Major depressive disorder, single episode, unspecified: Secondary | ICD-10-CM

## 2015-04-15 DIAGNOSIS — E785 Hyperlipidemia, unspecified: Secondary | ICD-10-CM

## 2015-04-15 DIAGNOSIS — F419 Anxiety disorder, unspecified: Secondary | ICD-10-CM

## 2015-04-15 DIAGNOSIS — Z0000 Encounter for general adult medical examination without abnormal findings: Secondary | ICD-10-CM | POA: Diagnosis not present

## 2015-04-15 DIAGNOSIS — Z1231 Encounter for screening mammogram for malignant neoplasm of breast: Secondary | ICD-10-CM

## 2015-04-15 DIAGNOSIS — G4700 Insomnia, unspecified: Secondary | ICD-10-CM

## 2015-04-15 DIAGNOSIS — F32A Depression, unspecified: Secondary | ICD-10-CM

## 2015-04-15 DIAGNOSIS — K219 Gastro-esophageal reflux disease without esophagitis: Secondary | ICD-10-CM

## 2015-04-15 LAB — POCT GLYCOSYLATED HEMOGLOBIN (HGB A1C): Hemoglobin A1C: 6.1

## 2015-04-15 MED ORDER — ROSUVASTATIN CALCIUM 40 MG PO TABS: 40.0000 mg | ORAL_TABLET | Freq: Every day | ORAL | Status: DC

## 2015-04-15 NOTE — Progress Notes (Signed)
Subjective:    Patient ID: Crystal Wong, female    DOB: [birth year], 58 y.o.   MRN: [id number]  CC: Annual Exam and Gynecologic Exam   HPI: Crystal Wong is a 60 y.o. female presenting for Annual Exam and Gynecologic Exam  GER: pt has scheduled appt with GI. Takes pantoprazole 1-2 times a day. Still has symptoms. Appetite is otherwise fine. No early satiety.  BMI elevated: cutting out dairy and red meat, more veggies.   Dad died in 06-Apr-2014, husband with [place] last year.   Pap smear: no h/o abnormals, s/p hysterectomy, doesn't think she has her cervix but isnt sure  Mammogram: h/o cystic changes, has needed surgery in past. No breast cancer.  Asthma: uses albuterol in spring often  Depression: on celexa, helping. Symptoms well controlled.    Depression screen [facility] 2/9 04/15/2015 10/13/2014 07/27/2014 03/23/2014 02/17/2014  Decreased Interest 0 0 0 0 0  Down, Depressed, Hopeless 0 0 0 0 0  PHQ - 2 Score 0 0 0 0 0     Relevant past medical, surgical, family and social history reviewed and updated as indicated. Interim medical history since our last visit reviewed. Allergies and medications reviewed and updated.    ROS: All systems negative other than what is in HPI  History  Smoking status  . Never Smoker   Smokeless tobacco  . Not on file    Past Medical History Patient Active Problem List   Diagnosis Date Noted  . Right knee pain 10/13/2014  . Anxiety   . Hyperlipidemia 08/23/2012  . Insomnia 08/23/2012  . GERD (gastroesophageal reflux disease) 08/23/2012  . Depression 08/23/2012  . GAD (generalized anxiety disorder) 08/23/2012    Current Outpatient Prescriptions  Medication Sig Dispense Refill  . albuterol (PROVENTIL HFA;VENTOLIN HFA) 108 (90 BASE) MCG/ACT inhaler Inhale 2 puffs into the lungs every 6 (six) hours as needed for wheezing. 1 Inhaler 1  . citalopram (CELEXA) 40 MG tablet TAKE ONE TABLET BY MOUTH ONE TIME DAILY 30 tablet 2  . fluticasone (FLONASE) 50  MCG/ACT nasal spray Place 2 sprays into both nostrils daily. 16 g 6  . loratadine-pseudoephedrine (CLARITIN-D 24 HOUR) 10-240 MG per 24 hr tablet Take 1 tablet by mouth daily. 90 tablet 1  . LORazepam (ATIVAN) 1 MG tablet TAKE  ONE TABLET BY MOUTH EVERY 8 HOURS AS NEEDED 30 tablet 1  . mometasone-formoterol (DULERA) 100-5 MCG/ACT AERO Inhale 1 puff into the lungs once. 1 Inhaler 1  . pantoprazole (PROTONIX) 40 MG tablet Take 1 tablet (40 mg total) by mouth 2 (two) times daily. 60 tablet 2  . rosuvastatin (CRESTOR) 40 MG tablet Take 1 tablet (40 mg total) by mouth daily. 30 tablet 0  . zolpidem (AMBIEN) 10 MG tablet TAKE ONE TABLET BY MOUTH AT BEDTIME 30 tablet 1   No current facility-administered medications for this visit.       Objective:    BP 125/77 mmHg  Pulse 87  Temp(Src) 97.8 F (36.6 C) (Oral)  Ht 5' 6" (1.676 m)  Wt 207 lb (93.895 kg)  BMI 33.43 kg/m2  Wt Readings from Last 3 Encounters:  04/15/15 207 lb (93.895 kg)  01/06/15 211 lb (95.709 kg)  11/10/14 211 lb (95.709 kg)     Gen: NAD, alert, cooperative with exam, NCAT EYES: EOMI, no scleral injection or icterus ENT:  OP without erythema LYMPH: no cervical LAD CV: NRRR, normal S1/S2, no murmur, distal pulses 2+ b/l Resp: CTABL, no wheezes, normal  WOB Abd: +BS, soft, NTND.  Ext: No edema, warm Neuro: Alert and oriented, strength equal b/l UE and LE, coordination grossly normal MSK: normal muscle bulk GU: normal vaginal ruggae, scant amount white-clear fluid in vaginal vault. No cervix identified.     Assessment & Plan:    Crystal Wong was seen today for annual exam and gynecologic exam.  Diagnoses and all orders for this visit:  Encounter for preventive health examination -     Pap IG and HPV (high risk) DNA detection -     BMP8+EGFR -     POCT glycosylated hemoglobin (Hb A1C) -     Hepatitis C antibody -     TSH -     MM Digital Screening; Future  Gastroesophageal reflux disease without esophagitis Continue  pantoprazole. Still with symptoms. Has appt with GI. Had EGD over ten years ago that was normal.   Depression Multiple stressors in last year, improving now. Symptoms well controlle don celexa. Continue.  Insomnia Takes ambien nightly. Encouraged her to cut back to every other night.  Anxiety Takes ativan rarely, less than once a month, only if needed. Encouraged continued avoidance of medicine as able. If needing it more often needs to be seen. Goal to stop completely eventually. -     TSH  Hyperlipidemia -     rosuvastatin (CRESTOR) 40 MG tablet; Take 1 tablet (40 mg total) by mouth daily. -     Lipid panel  Hyperglycemia -     POCT glycosylated hemoglobin (Hb A1C)  Colon ca screening: has appt with GI in next month to discuss colonoscopy and GERD  Breast exam: declined  Follow up plan: Return in about 6 months (around 10/13/2015) for med refill.  Carol Vincent, MD [facility] 04/15/2015, 11:01 AM   

## 2015-04-16 LAB — BMP8+EGFR
BUN/Creatinine Ratio: 20 (ref 9–23)
BUN: 17 mg/dL (ref 6–24)
CO2: 25 mmol/L (ref 18–29)
CREATININE: 0.86 mg/dL (ref 0.57–1.00)
Calcium: 10 mg/dL (ref 8.7–10.2)
Chloride: 98 mmol/L (ref 96–106)
GFR, EST AFRICAN AMERICAN: 87 mL/min/{1.73_m2} (ref >59)
GFR, EST NON AFRICAN AMERICAN: 75 mL/min/{1.73_m2} (ref >59)
Glucose: 109 mg/dL — ABNORMAL HIGH (ref 65–99)
POTASSIUM: 4.8 mmol/L (ref 3.5–5.2)
SODIUM: 139 mmol/L (ref 134–144)

## 2015-04-16 LAB — HEPATITIS C ANTIBODY

## 2015-04-16 LAB — LIPID PANEL
CHOLESTEROL TOTAL: 263 mg/dL — AB (ref 100–199)
Chol/HDL Ratio: 4.5 ratio units — ABNORMAL HIGH (ref 0.0–4.4)
HDL: 59 mg/dL (ref >39)
LDL CALC: 183 mg/dL — AB (ref 0–99)
TRIGLYCERIDES: 107 mg/dL (ref 0–149)
VLDL CHOLESTEROL CAL: 21 mg/dL (ref 5–40)

## 2015-04-16 LAB — TSH: TSH: 1.39 u[IU]/mL (ref 0.450–4.500)

## 2015-04-23 LAB — PAP IG AND HPV HIGH-RISK
HPV, high-risk: NEGATIVE
PAP SMEAR COMMENT: 0

## 2015-04-28 ENCOUNTER — Other Ambulatory Visit: Payer: Self-pay | Admitting: Physician Assistant

## 2015-04-28 ENCOUNTER — Other Ambulatory Visit: Payer: Self-pay | Admitting: Family Medicine

## 2015-04-30 ENCOUNTER — Encounter: Payer: Self-pay | Admitting: *Deleted

## 2015-05-05 ENCOUNTER — Other Ambulatory Visit: Payer: Self-pay

## 2015-05-05 ENCOUNTER — Other Ambulatory Visit: Payer: Self-pay | Admitting: Family Medicine

## 2015-05-05 MED ORDER — ZOLPIDEM TARTRATE 10 MG PO TABS: 10.0000 mg | ORAL_TABLET | Freq: Every day | ORAL | Status: DC

## 2015-05-05 NOTE — Telephone Encounter (Signed)
Last seen 04/15/15 Dr Evette Doffing  If approved route to nurse to call into [facility]

## 2015-05-11 ENCOUNTER — Other Ambulatory Visit: Payer: Self-pay | Admitting: Family Medicine

## 2015-05-11 NOTE — Telephone Encounter (Signed)
Rx called to pharmacy

## 2015-05-11 NOTE — Telephone Encounter (Signed)
This was approved on 05/05/15 but may not have been called in. Called in and left refill info on voicemail. Patient aware

## 2015-06-07 ENCOUNTER — Other Ambulatory Visit: Payer: Self-pay | Admitting: Family Medicine

## 2015-06-08 NOTE — Telephone Encounter (Signed)
Last seen 04/15/15  Dr Evette Doffing  If approved route to nurse to call into [facility]

## 2015-06-08 NOTE — Telephone Encounter (Signed)
Please call in ativan with 1 refills 

## 2015-07-13 LAB — HM COLONOSCOPY

## 2015-08-02 ENCOUNTER — Other Ambulatory Visit: Payer: Self-pay | Admitting: Family Medicine

## 2015-08-03 ENCOUNTER — Encounter: Payer: 59 | Admitting: *Deleted

## 2015-08-04 ENCOUNTER — Other Ambulatory Visit: Payer: Self-pay | Admitting: Family Medicine

## 2015-08-04 NOTE — Telephone Encounter (Signed)
Last seen 04/15/15 Dr Sabra Heck   If approved route to nurse to call into [facility]

## 2015-08-04 NOTE — Telephone Encounter (Signed)
Refill on ambien called to [facility], vm.

## 2015-08-31 ENCOUNTER — Other Ambulatory Visit: Payer: Self-pay | Admitting: Pediatrics

## 2015-09-24 ENCOUNTER — Other Ambulatory Visit: Payer: Self-pay | Admitting: Pediatrics

## 2015-10-01 ENCOUNTER — Other Ambulatory Visit: Payer: Self-pay | Admitting: Pediatrics

## 2015-10-01 NOTE — Telephone Encounter (Signed)
Authorize 30 days only. Then contact the patient letting them know that they will need an appointment before any further prescriptions can be sent in. 

## 2015-10-05 ENCOUNTER — Other Ambulatory Visit: Payer: Self-pay

## 2015-10-05 MED ORDER — LORATADINE-PSEUDOEPHEDRINE ER 10-240 MG PO TB24: 1.0000 | ORAL_TABLET | Freq: Every day | ORAL | 0 refills | Status: DC

## 2015-11-04 ENCOUNTER — Other Ambulatory Visit: Payer: Self-pay | Admitting: Family Medicine

## 2015-11-04 NOTE — Telephone Encounter (Signed)
Pt requesting refill on Ambien 10mg  1 PO QHS, last given #30 with 1 refill 08/04/15. If approved please route to Nurse Pool A for nurse to call in.

## 2015-11-29 ENCOUNTER — Other Ambulatory Visit: Payer: Self-pay | Admitting: Family Medicine

## 2015-12-01 ENCOUNTER — Other Ambulatory Visit: Payer: Self-pay | Admitting: Family Medicine

## 2015-12-17 ENCOUNTER — Encounter: Payer: Self-pay | Admitting: Physician Assistant

## 2015-12-17 ENCOUNTER — Ambulatory Visit: Payer: 59 | Admitting: Family Medicine

## 2015-12-17 ENCOUNTER — Ambulatory Visit (INDEPENDENT_AMBULATORY_CARE_PROVIDER_SITE_OTHER): Payer: 59 | Admitting: Physician Assistant

## 2015-12-17 VITALS — BP 125/79 | HR 75 | Temp 97.7°F | Ht 66.0 in | Wt 210.0 lb

## 2015-12-17 DIAGNOSIS — J209 Acute bronchitis, unspecified: Secondary | ICD-10-CM | POA: Diagnosis not present

## 2015-12-17 MED ORDER — FLUCONAZOLE 150 MG PO TABS: 150.0000 mg | ORAL_TABLET | Freq: Once | ORAL | 0 refills | Status: AC

## 2015-12-17 MED ORDER — METHYLPREDNISOLONE ACETATE 80 MG/ML IJ SUSP
80.0000 mg | Freq: Once | INTRAMUSCULAR | Status: AC
  Administered 2015-12-17: 80 mg via INTRAMUSCULAR

## 2015-12-17 MED ORDER — HYDROCODONE-HOMATROPINE 5-1.5 MG/5ML PO SYRP: 5.0000 mL | ORAL_SOLUTION | Freq: Three times a day (TID) | ORAL | 0 refills | Status: DC | PRN

## 2015-12-17 MED ORDER — AZITHROMYCIN 250 MG PO TABS: ORAL_TABLET | ORAL | 0 refills | Status: DC

## 2015-12-17 NOTE — Patient Instructions (Signed)

## 2015-12-17 NOTE — Progress Notes (Signed)
BP 125/79   Pulse 75   Temp 97.7 F (36.5 C) (Oral)   Ht 5\' 6"  (1.676 m)   Wt 210 lb (95.3 kg)   BMI 33.89 kg/m    Subjective:    Patient ID: Crystal Wong, female    DOB: Feb 15, 1956, 59 y.o.   MRN: [id number]  HPI: Crystal Wong is a 60 y.o. female presenting on 12/17/2015 for Sinus Problem; Ear Pain; and Nausea (green sputum with cough) Patient has had greater than one week the symptoms with headache sinus pressure, postnasal drainage, cough and mild wheezing. She does have an inhaler at home. We discussed her using it 4 times a day to help prevent the cough being caused by the bronchospasm in her airway. She denies any high fever. She has states she has been sleeping significantly more.  Relevant past medical, surgical, family and social history reviewed and updated as indicated. Allergies and medications reviewed and updated.  Past Medical History:  Diagnosis Date  . Anxiety   . GERD (gastroesophageal reflux disease)   . Hyperlipidemia   . Insomnia     No past surgical history on file.  Review of Systems  Constitutional: Positive for diaphoresis and fatigue. Negative for appetite change, chills and fever.  HENT: Positive for congestion, postnasal drip and sore throat. Negative for ear pain.   Eyes: Negative.   Respiratory: Positive for cough and wheezing.   Cardiovascular: Negative.   Gastrointestinal: Negative.   Genitourinary: Negative.       Medication List       Accurate as of 12/17/15 12:23 PM. Always use your most recent med list.          albuterol 108 (90 Base) MCG/ACT inhaler Commonly known as:  PROVENTIL HFA;VENTOLIN HFA Inhale 2 puffs into the lungs every 6 (six) hours as needed for wheezing.   azithromycin 250 MG tablet Commonly known as:  ZITHROMAX Z-PAK As directed   citalopram 40 MG tablet Commonly known as:  CELEXA TAKE ONE TABLET BY MOUTH ONE TIME DAILY   fluticasone 50 MCG/ACT nasal spray Commonly known as:  FLONASE Place 2 sprays  into both nostrils daily.   HYDROcodone-homatropine 5-1.5 MG/5ML syrup Commonly known as:  HYCODAN Take 5-10 mLs by mouth every 8 (eight) hours as needed for cough.   loratadine-pseudoephedrine 10-240 MG 24 hr tablet Commonly known as:  CLARITIN-D 24 HOUR Take 1 tablet by mouth daily.   LORazepam 1 MG tablet Commonly known as:  ATIVAN TAKE ONE TABLET BY MOUTH EVERY 8 HOURS AS NEEDED   mometasone-formoterol 100-5 MCG/ACT Aero Commonly known as:  DULERA Inhale 1 puff into the lungs once.   pantoprazole 40 MG tablet Commonly known as:  PROTONIX TAKE 1 TABLET (40 MG TOTAL) BY MOUTH 2 (TWO) TIMES DAILY.   rosuvastatin 40 MG tablet Commonly known as:  CRESTOR TAKE ONE TABLET BY MOUTH ONE TIME DAILY   zolpidem 10 MG tablet Commonly known as:  AMBIEN TAKE 1 TABLET BY MOUTH AT BEDTIME AS NEEDED          Objective:    BP 125/79   Pulse 75   Temp 97.7 F (36.5 C) (Oral)   Ht 5\' 6"  (1.676 m)   Wt 210 lb (95.3 kg)   BMI 33.89 kg/m   Allergies  Allergen Reactions  . Sulfur     Other reaction(s): Angioedema (ALLERGY/intolerance)    Physical Exam  Constitutional: She is oriented to person, place, and time. She appears well-developed and well-nourished.  HENT:  Head: Normocephalic and atraumatic.  Right Ear: No drainage or tenderness.  Left Ear: No drainage or tenderness.  Nose: Mucosal edema and rhinorrhea present. Right sinus exhibits no maxillary sinus tenderness and no frontal sinus tenderness. Left sinus exhibits no maxillary sinus tenderness and no frontal sinus tenderness.  Mouth/Throat: Oropharyngeal exudate and posterior oropharyngeal erythema present.  Eyes: Conjunctivae and EOM are normal. Pupils are equal, round, and reactive to light.  Neck: Normal range of motion. Neck supple.  Cardiovascular: Normal rate, regular rhythm, normal heart sounds and intact distal pulses.   Pulmonary/Chest: Effort normal. She has wheezes in the right upper field and the left upper  field.  Abdominal: Soft. Bowel sounds are normal.  Neurological: She is alert and oriented to person, place, and time. She has normal reflexes.  Skin: Skin is warm and dry. No rash noted.  Psychiatric: She has a normal mood and affect. Her behavior is normal. Judgment and thought content normal.  Nursing note and vitals reviewed.       Assessment & Plan:   1. Acute bronchitis, unspecified organism - azithromycin (ZITHROMAX Z-PAK) 250 MG tablet; As directed  Dispense: 6 tablet; Refill: 0 - methylPREDNISolone acetate (DEPO-MEDROL) injection 80 mg; Inject 1 mL (80 mg total) into the muscle once. - HYDROcodone-homatropine (HYCODAN) 5-1.5 MG/5ML syrup; Take 5-10 mLs by mouth every 8 (eight) hours as needed for cough.  Dispense: 240 mL; Refill: 0   Continue all other maintenance medications as listed above.  Follow up plan: Return if symptoms worsen or fail to improve.  Educational handout given for bronchitis  Terald Sleeper PA-C [facility] [address]  [place], [place] [address] [phone]   12/17/2015, 12:23 PM

## 2015-12-24 ENCOUNTER — Telehealth: Payer: Self-pay | Admitting: Physician Assistant

## 2015-12-27 MED ORDER — AZITHROMYCIN 250 MG PO TABS: ORAL_TABLET | ORAL | 0 refills | Status: DC

## 2015-12-27 NOTE — Telephone Encounter (Signed)
Patient's husband aware °

## 2015-12-27 NOTE — Telephone Encounter (Signed)
Medications sent

## 2015-12-29 ENCOUNTER — Other Ambulatory Visit: Payer: Self-pay | Admitting: Family Medicine

## 2015-12-30 ENCOUNTER — Other Ambulatory Visit: Payer: Self-pay | Admitting: Pediatrics

## 2016-01-01 ENCOUNTER — Telehealth: Payer: Self-pay | Admitting: Physician Assistant

## 2016-01-01 MED ORDER — FLUCONAZOLE 150 MG PO TABS: 150.0000 mg | ORAL_TABLET | Freq: Once | ORAL | 0 refills | Status: DC

## 2016-01-01 NOTE — Telephone Encounter (Signed)
Diflucan sent to the pharmacy

## 2016-01-03 ENCOUNTER — Other Ambulatory Visit: Payer: Self-pay | Admitting: *Deleted

## 2016-01-03 MED ORDER — PANTOPRAZOLE SODIUM 40 MG PO TBEC: DELAYED_RELEASE_TABLET | ORAL | 0 refills | Status: DC

## 2016-01-10 ENCOUNTER — Other Ambulatory Visit: Payer: Self-pay | Admitting: *Deleted

## 2016-01-10 MED ORDER — LORATADINE-PSEUDOEPHEDRINE ER 10-240 MG PO TB24: 1.0000 | ORAL_TABLET | Freq: Every day | ORAL | 0 refills | Status: DC

## 2016-01-24 ENCOUNTER — Other Ambulatory Visit: Payer: Self-pay | Admitting: Nurse Practitioner

## 2016-01-25 ENCOUNTER — Other Ambulatory Visit: Payer: Self-pay | Admitting: *Deleted

## 2016-01-25 MED ORDER — ROSUVASTATIN CALCIUM 40 MG PO TABS: 40.0000 mg | ORAL_TABLET | Freq: Every day | ORAL | 0 refills | Status: DC

## 2016-01-25 NOTE — Telephone Encounter (Signed)
She is a Emergency planning/management officer patient, I saw her as a work in.

## 2016-01-26 NOTE — Telephone Encounter (Signed)
Sabra Heck is primary, he will need to review

## 2016-01-26 NOTE — Telephone Encounter (Signed)
Hassell Done last gave this rx - [facility] is coverage - please address

## 2016-01-27 ENCOUNTER — Other Ambulatory Visit: Payer: Self-pay | Admitting: Family Medicine

## 2016-02-02 ENCOUNTER — Other Ambulatory Visit: Payer: Self-pay | Admitting: Nurse Practitioner

## 2016-02-17 ENCOUNTER — Other Ambulatory Visit: Payer: Self-pay | Admitting: *Deleted

## 2016-02-17 NOTE — Telephone Encounter (Signed)
Detailed message left for patient that she needs to be seen for a refill.

## 2016-02-24 ENCOUNTER — Other Ambulatory Visit: Payer: Self-pay | Admitting: Family Medicine

## 2016-03-02 ENCOUNTER — Encounter: Payer: Self-pay | Admitting: Nurse Practitioner

## 2016-03-02 ENCOUNTER — Ambulatory Visit (INDEPENDENT_AMBULATORY_CARE_PROVIDER_SITE_OTHER): Payer: 59 | Admitting: Nurse Practitioner

## 2016-03-02 ENCOUNTER — Encounter: Payer: Self-pay | Admitting: *Deleted

## 2016-03-02 VITALS — BP 113/72 | HR 75 | Temp 97.6°F | Ht 66.0 in | Wt 210.0 lb

## 2016-03-02 DIAGNOSIS — Z6833 Body mass index (BMI) 33.0-33.9, adult: Secondary | ICD-10-CM | POA: Insufficient documentation

## 2016-03-02 DIAGNOSIS — F411 Generalized anxiety disorder: Secondary | ICD-10-CM | POA: Diagnosis not present

## 2016-03-02 DIAGNOSIS — F419 Anxiety disorder, unspecified: Secondary | ICD-10-CM

## 2016-03-02 DIAGNOSIS — F3342 Major depressive disorder, recurrent, in full remission: Secondary | ICD-10-CM | POA: Diagnosis not present

## 2016-03-02 DIAGNOSIS — E782 Mixed hyperlipidemia: Secondary | ICD-10-CM

## 2016-03-02 DIAGNOSIS — F5101 Primary insomnia: Secondary | ICD-10-CM | POA: Diagnosis not present

## 2016-03-02 DIAGNOSIS — K219 Gastro-esophageal reflux disease without esophagitis: Secondary | ICD-10-CM | POA: Diagnosis not present

## 2016-03-02 MED ORDER — CITALOPRAM HYDROBROMIDE 40 MG PO TABS: ORAL_TABLET | ORAL | 5 refills | Status: DC

## 2016-03-02 MED ORDER — LORAZEPAM 1 MG PO TABS: ORAL_TABLET | ORAL | 5 refills | Status: DC

## 2016-03-02 MED ORDER — PANTOPRAZOLE SODIUM 40 MG PO TBEC: DELAYED_RELEASE_TABLET | ORAL | 0 refills | Status: DC

## 2016-03-02 MED ORDER — ROSUVASTATIN CALCIUM 40 MG PO TABS: 40.0000 mg | ORAL_TABLET | Freq: Every day | ORAL | 0 refills | Status: DC

## 2016-03-02 NOTE — Progress Notes (Signed)
Subjective:    Patient ID: Crystal Wong, female    DOB: [birth year], 58 y.o.   MRN: [id number]   Patient here today for follow up of chronic medical problems.  Outpatient Encounter Prescriptions as of 03/02/2016  Medication Sig  . albuterol (PROVENTIL HFA;VENTOLIN HFA) 108 (90 BASE) MCG/ACT inhaler Inhale 2 puffs into the lungs every 6 (six) hours as needed for wheezing.  . citalopram (CELEXA) 40 MG tablet TAKE ONE TABLET BY MOUTH ONE TIME DAILY MUST BE SEEN FOR NEXT REFILL  . fluticasone (FLONASE) 50 MCG/ACT nasal spray Place 2 sprays into both nostrils daily.  Marland Kitchen loratadine-pseudoephedrine (CLARITIN-D 24 HOUR) 10-240 MG 24 hr tablet Take 1 tablet by mouth daily.  Marland Kitchen LORazepam (ATIVAN) 1 MG tablet TAKE 1 TABLET BY MOUTH EVERY EIGHT HOURS AS NEEDED  . mometasone-formoterol (DULERA) 100-5 MCG/ACT AERO Inhale 1 puff into the lungs once.  . pantoprazole (PROTONIX) 40 MG tablet TAKE 1 TABLET (40 MG TOTAL) BY MOUTH 2 (TWO) TIMES DAILY.  . rosuvastatin (CRESTOR) 40 MG tablet Take 1 tablet (40 mg total) by mouth daily.    Hyperlipidemia  This is a chronic problem. The current episode started more than 1 year ago. Recent lipid tests were reviewed and are variable. She has no history of diabetes, hypothyroidism or obesity. Current antihyperlipidemic treatment includes statins. The current treatment provides moderate improvement of lipids. There are no compliance problems.  Risk factors for coronary artery disease include dyslipidemia, obesity, post-menopausal and a sedentary lifestyle.  GERD Currently on protonix daily- works to keep symptoms under control GAD /Depression Currently on celexa which is working well to keep her from feeling sad all the time- she denies any side effects. She is also on ativan BID as needed.  Review of Systems  Constitutional: Negative.   HENT: Negative.   Respiratory: Negative.   Cardiovascular: Negative.   Gastrointestinal: Negative.   Genitourinary: Negative.     Neurological: Negative.   Psychiatric/Behavioral: Negative.   All other systems reviewed and are negative.      Objective:   Physical Exam  Constitutional: She appears well-developed and well-nourished. No distress.  Eyes: Pupils are equal, round, and reactive to light.  Neck: Normal range of motion.  Cardiovascular: Normal rate, regular rhythm and normal heart sounds.   Pulmonary/Chest: Effort normal and breath sounds normal.  Abdominal: Soft. Bowel sounds are normal.  Neurological: She is alert.  Skin: Skin is warm.  Psychiatric: She has a normal mood and affect. Her behavior is normal. Judgment and thought content normal.   BP 113/72   Pulse 75   Temp 97.6 F (36.4 C) (Oral)   Ht 5\' 6"  (1.676 m)   Wt 210 lb (95.3 kg)   BMI 33.89 kg/m      Assessment & Plan:  1. Gastroesophageal reflux disease without esophagitis Avoid spicy foods Do not eat 2 hours prior to bedtime - pantoprazole (PROTONIX) 40 MG tablet; TAKE 1 TABLET (40 MG TOTAL) BY MOUTH 2 (TWO) TIMES DAILY.  Dispense: 180 tablet; Refill: 0  2. Anxiety Stress management - LORazepam (ATIVAN) 1 MG tablet; TAKE 1 TABLET BY MOUTH EVERY EIGHT HOURS AS NEEDED  Dispense: 30 tablet; Refill: 5  3. Recurrent major depressive disorder, in full remission ([place]) - citalopram (CELEXA) 40 MG tablet; TAKE ONE TABLET BY MOUTH ONE TIME DAILY MUST BE SEEN FOR NEXT REFILL  Dispense: 30 tablet; Refill: 5  4. GAD (generalized anxiety disorder)  5. Mixed hyperlipidemia Low fat diet - rosuvastatin (CRESTOR) 40  MG tablet; Take 1 tablet (40 mg total) by mouth daily.  Dispense: 90 tablet; Refill: 0  6. Primary insomnia Bedtime routine  7. BMI 33.0-33.9,adult *Discussed diet and exercise for person with BMI >25 Will recheck weight in 3-6 months**    Labs pending Health maintenance reviewed Diet and exercise encouraged Continue all meds Follow up  In 6 month   [place], FNP

## 2016-03-02 NOTE — Patient Instructions (Signed)

## 2016-03-02 NOTE — Addendum Note (Signed)
Addended by: Chevis Pretty on: 03/02/2016 10:14 AM   Modules accepted: Orders

## 2016-03-03 LAB — LIPID PANEL
CHOL/HDL RATIO: 4.1 ratio (ref 0.0–4.4)
CHOLESTEROL TOTAL: 248 mg/dL — AB (ref 100–199)
HDL: 61 mg/dL (ref >39)
LDL CALC: 166 mg/dL — AB (ref 0–99)
TRIGLYCERIDES: 105 mg/dL (ref 0–149)
VLDL Cholesterol Cal: 21 mg/dL (ref 5–40)

## 2016-03-03 LAB — CMP14+EGFR
A/G RATIO: 1.7 (ref 1.2–2.2)
ALBUMIN: 4.4 g/dL (ref 3.5–5.5)
ALK PHOS: 80 IU/L (ref 39–117)
ALT: 15 IU/L (ref 0–32)
AST: 14 IU/L (ref 0–40)
BUN / CREAT RATIO: 22 (ref 9–23)
BUN: 19 mg/dL (ref 6–24)
Bilirubin Total: 0.3 mg/dL (ref 0.0–1.2)
CO2: 26 mmol/L (ref 18–29)
CREATININE: 0.86 mg/dL (ref 0.57–1.00)
Calcium: 9.7 mg/dL (ref 8.7–10.2)
Chloride: 101 mmol/L (ref 96–106)
GFR calc Af Amer: 86 mL/min/{1.73_m2} (ref >59)
GFR, EST NON AFRICAN AMERICAN: 75 mL/min/{1.73_m2} (ref >59)
GLOBULIN, TOTAL: 2.6 g/dL (ref 1.5–4.5)
Glucose: 107 mg/dL — ABNORMAL HIGH (ref 65–99)
POTASSIUM: 5.1 mmol/L (ref 3.5–5.2)
SODIUM: 142 mmol/L (ref 134–144)
Total Protein: 7 g/dL (ref 6.0–8.5)

## 2016-04-05 ENCOUNTER — Ambulatory Visit (INDEPENDENT_AMBULATORY_CARE_PROVIDER_SITE_OTHER): Payer: 59 | Admitting: Physician Assistant

## 2016-04-05 ENCOUNTER — Encounter: Payer: Self-pay | Admitting: Physician Assistant

## 2016-04-05 VITALS — BP 128/80 | HR 82 | Temp 98.5°F | Ht 66.0 in | Wt 209.4 lb

## 2016-04-05 DIAGNOSIS — J40 Bronchitis, not specified as acute or chronic: Secondary | ICD-10-CM | POA: Diagnosis not present

## 2016-04-05 DIAGNOSIS — J111 Influenza due to unidentified influenza virus with other respiratory manifestations: Secondary | ICD-10-CM | POA: Diagnosis not present

## 2016-04-05 MED ORDER — DOXYCYCLINE HYCLATE 100 MG PO TABS: 100.0000 mg | ORAL_TABLET | Freq: Two times a day (BID) | ORAL | 0 refills | Status: DC

## 2016-04-05 MED ORDER — OSELTAMIVIR PHOSPHATE 75 MG PO CAPS: 75.0000 mg | ORAL_CAPSULE | Freq: Two times a day (BID) | ORAL | 0 refills | Status: DC

## 2016-04-05 NOTE — Progress Notes (Signed)
BP 128/80   Pulse 82   Temp 98.5 F (36.9 C) (Oral)   Ht 5\' 6"  (1.676 m)   Wt 209 lb 6.4 oz (95 kg)   BMI 33.80 kg/m    Subjective:    Patient ID: Crystal Wong, female    DOB: Mar 11, 1955, 61 y.o.   MRN: [id number]  HPI: Crystal Wong is a 61 y.o. female presenting on 04/05/2016 for Generalized Body Aches; Ear Pain; Sinusitis; Fatigue; and Nausea The patient reports having some influenza exposure through her job. She has had this under 2 days of symptoms. She denies any vomiting. Known recurrent bronchitis history.  Already with a lot of cough.  Past Medical History:  Diagnosis Date  . Anxiety   . GERD (gastroesophageal reflux disease)   . Hyperlipidemia   . Insomnia    Relevant past medical, surgical, family and social history reviewed and updated as indicated. Interim medical history since our last visit reviewed. Allergies and medications reviewed and updated. DATA REVIEWED: CHART IN EPIC  Social History   Social History  . Marital status: Married    Spouse name: N/A  . Number of children: N/A  . Years of education: N/A   Occupational History  . Not on file.   Social History Main Topics  . Smoking status: Never Smoker  . Smokeless tobacco: Never Used  . Alcohol use No  . Drug use: No  . Sexual activity: Not on file   Other Topics Concern  . Not on file   Social History Narrative  . No narrative on file    History reviewed. No pertinent surgical history.  Family History  Problem Relation Age of Onset  . Cancer Father     Review of Systems  Constitutional: Positive for appetite change, chills, fatigue and fever. Negative for activity change.  HENT: Positive for congestion, postnasal drip and sore throat.   Eyes: Negative.   Respiratory: Positive for cough and shortness of breath. Negative for wheezing.   Cardiovascular: Negative.  Negative for chest pain, palpitations and leg swelling.  Gastrointestinal: Negative.   Genitourinary: Negative.     Musculoskeletal: Positive for myalgias.  Skin: Negative.   Neurological: Positive for headaches.    Allergies as of 04/05/2016      Reactions   Sulfur    Other reaction(s): Angioedema (ALLERGY/intolerance)      Medication List       Accurate as of 04/05/16  2:14 PM. Always use your most recent med list.          albuterol 108 (90 Base) MCG/ACT inhaler Commonly known as:  PROVENTIL HFA;VENTOLIN HFA Inhale 2 puffs into the lungs every 6 (six) hours as needed for wheezing.   citalopram 40 MG tablet Commonly known as:  CELEXA TAKE ONE TABLET BY MOUTH ONE TIME DAILY MUST BE SEEN FOR NEXT REFILL   doxycycline 100 MG tablet Commonly known as:  VIBRA-TABS Take 1 tablet (100 mg total) by mouth 2 (two) times daily.   fluticasone 50 MCG/ACT nasal spray Commonly known as:  FLONASE Place 2 sprays into both nostrils daily.   loratadine-pseudoephedrine 10-240 MG 24 hr tablet Commonly known as:  CLARITIN-D 24 HOUR Take 1 tablet by mouth daily.   LORazepam 1 MG tablet Commonly known as:  ATIVAN TAKE 1 TABLET BY MOUTH EVERY EIGHT HOURS AS NEEDED   mometasone-formoterol 100-5 MCG/ACT Aero Commonly known as:  DULERA Inhale 1 puff into the lungs once.   oseltamivir 75 MG capsule Commonly known  as:  TAMIFLU Take 1 capsule (75 mg total) by mouth 2 (two) times daily.   pantoprazole 40 MG tablet Commonly known as:  PROTONIX TAKE 1 TABLET (40 MG TOTAL) BY MOUTH 2 (TWO) TIMES DAILY.   rosuvastatin 40 MG tablet Commonly known as:  CRESTOR Take 1 tablet (40 mg total) by mouth daily.          Objective:    BP 128/80   Pulse 82   Temp 98.5 F (36.9 C) (Oral)   Ht 5\' 6"  (1.676 m)   Wt 209 lb 6.4 oz (95 kg)   BMI 33.80 kg/m   Allergies  Allergen Reactions  . Sulfur     Other reaction(s): Angioedema (ALLERGY/intolerance)    Wt Readings from Last 3 Encounters:  04/05/16 209 lb 6.4 oz (95 kg)  03/02/16 210 lb (95.3 kg)  12/17/15 210 lb (95.3 kg)    Physical Exam   Constitutional: She is oriented to person, place, and time. She appears well-developed and well-nourished.  HENT:  Head: Normocephalic and atraumatic.  Right Ear: A middle ear effusion is present.  Left Ear: A middle ear effusion is present.  Nose: Mucosal edema present. Right sinus exhibits no frontal sinus tenderness. Left sinus exhibits no frontal sinus tenderness.  Mouth/Throat: Posterior oropharyngeal erythema present. No oropharyngeal exudate or tonsillar abscesses.  Eyes: Conjunctivae and EOM are normal. Pupils are equal, round, and reactive to light.  Neck: Normal range of motion.  Cardiovascular: Normal rate, regular rhythm, normal heart sounds and intact distal pulses.   Pulmonary/Chest: Effort normal and breath sounds normal.  Abdominal: Soft. Bowel sounds are normal.  Neurological: She is alert and oriented to person, place, and time. She has normal reflexes.  Skin: Skin is warm and dry. No rash noted.  Psychiatric: She has a normal mood and affect. Her behavior is normal. Judgment and thought content normal.  Nursing note and vitals reviewed.       Assessment & Plan:   1. Influenza - oseltamivir (TAMIFLU) 75 MG capsule; Take 1 capsule (75 mg total) by mouth 2 (two) times daily.  Dispense: 10 capsule; Refill: 0  2. Bronchitis - doxycycline (VIBRA-TABS) 100 MG tablet; Take 1 tablet (100 mg total) by mouth 2 (two) times daily.  Dispense: 20 tablet; Refill: 0 With patient's high rate of bronchitis, started medications. Has inhalers.  Continue all other maintenance medications as listed above.  Follow up plan: Return if symptoms worsen or fail to improve.    Educational handout given for influenza  Terald Sleeper PA-C [facility] [address]  [place], [place] [address] [phone]   04/05/2016, 2:14 PM

## 2016-04-05 NOTE — Patient Instructions (Signed)

## 2016-04-13 ENCOUNTER — Telehealth: Payer: Self-pay | Admitting: Family Medicine

## 2016-04-13 ENCOUNTER — Other Ambulatory Visit: Payer: Self-pay | Admitting: Physician Assistant

## 2016-04-13 MED ORDER — AMOXICILLIN 500 MG PO CAPS: 500.0000 mg | ORAL_CAPSULE | Freq: Three times a day (TID) | ORAL | 0 refills | Status: DC

## 2016-04-13 NOTE — Telephone Encounter (Signed)
Covering for PCP.   Amox sent  If no improvement she will need to be seen as she was treated with doxy initialy as well.   Laroy Apple, MD [facility] 04/13/2016, 1:07 PM

## 2016-04-13 NOTE — Telephone Encounter (Signed)
Detailed message left for patient.

## 2016-04-13 NOTE — Telephone Encounter (Signed)
Pt saw Crystal Wong on 1/31 and was dx'd with the flu but has ear pain, does she need to be seen or can we send something in for her?

## 2016-04-19 ENCOUNTER — Telehealth: Payer: Self-pay | Admitting: Family Medicine

## 2016-04-19 ENCOUNTER — Other Ambulatory Visit: Payer: Self-pay | Admitting: Family Medicine

## 2016-04-19 MED ORDER — CLOTRIMAZOLE 10 MG MT TROC: OROMUCOSAL | 2 refills | Status: DC

## 2016-04-19 NOTE — Telephone Encounter (Signed)
Covering PCP, please advise and send back to the pools. 

## 2016-04-19 NOTE — Telephone Encounter (Signed)
I sent in the requested prescription 

## 2016-04-19 NOTE — Telephone Encounter (Signed)
Pt aware by VM

## 2016-04-24 ENCOUNTER — Encounter: Payer: Self-pay | Admitting: Pediatrics

## 2016-04-24 ENCOUNTER — Ambulatory Visit (INDEPENDENT_AMBULATORY_CARE_PROVIDER_SITE_OTHER): Payer: 59 | Admitting: Pediatrics

## 2016-04-24 ENCOUNTER — Other Ambulatory Visit: Payer: Self-pay | Admitting: Family Medicine

## 2016-04-24 VITALS — BP 121/76 | HR 84 | Temp 98.6°F | Ht 66.0 in | Wt 215.0 lb

## 2016-04-24 DIAGNOSIS — B379 Candidiasis, unspecified: Secondary | ICD-10-CM

## 2016-04-24 DIAGNOSIS — J45909 Unspecified asthma, uncomplicated: Secondary | ICD-10-CM | POA: Insufficient documentation

## 2016-04-24 DIAGNOSIS — B9789 Other viral agents as the cause of diseases classified elsewhere: Secondary | ICD-10-CM | POA: Diagnosis not present

## 2016-04-24 DIAGNOSIS — J45991 Cough variant asthma: Secondary | ICD-10-CM | POA: Insufficient documentation

## 2016-04-24 DIAGNOSIS — J069 Acute upper respiratory infection, unspecified: Secondary | ICD-10-CM

## 2016-04-24 MED ORDER — FLUCONAZOLE 150 MG PO TABS: 150.0000 mg | ORAL_TABLET | ORAL | 0 refills | Status: DC | PRN

## 2016-04-24 MED ORDER — GUAIFENESIN-CODEINE 100-10 MG/5ML PO SOLN: 5.0000 mL | Freq: Three times a day (TID) | ORAL | 0 refills | Status: DC | PRN

## 2016-04-24 NOTE — Patient Instructions (Signed)
Netipot with distilled water 2-3 times a day to clear out sinuses Or Normal saline nasal spray Flonase steroid nasal spray Antihistamine daily such as claritin Ibuprofen 400mg  three times a day Lots of fluids

## 2016-04-24 NOTE — Progress Notes (Signed)
  Subjective:   Patient ID: Crystal Wong, female    DOB: 1955/10/25, 61 y.o.   MRN: [id number] CC: Cough and Nasal Congestion  HPI: Crystal Wong is a 61 y.o. female presenting for Cough and Nasal Congestion  Seen 3 weeks ago for influenza and URI symptoms Started on the tamiflu and doxycycline then Got better from influenza Still with some congestion last week, Started on amoxilicillin for ongoing symptoms. Has one more day left Thinks she got somewhat better Started coughing again 3 days ago Temp up to 98 at home, says usually 96.5 No chills Thinks she got an adidtional illness Doesn't feel like she is coughing much up Appetite is normal Taking mucinex, doesn't think it is helping Has been taking tylenol and ibuprofen  Using albuteorl apprx twice a day  Starting to get itchy, irritated in vaginal area Often has yeast infections with abx  Relevant past medical, surgical, family and social history reviewed. Allergies and medications reviewed and updated. History  Smoking Status  . Never Smoker  Smokeless Tobacco  . Never Used   ROS: Per HPI   Objective:    BP 121/76 (BP Location: Right Arm)   Pulse 84   Temp 98.6 F (37 C) (Oral)   Ht 5\' 6"  (1.676 m)   Wt 215 lb (97.5 kg)   BMI 34.70 kg/m   Wt Readings from Last 3 Encounters:  04/24/16 215 lb (97.5 kg)  04/05/16 209 lb 6.4 oz (95 kg)  03/02/16 210 lb (95.3 kg)    Gen: NAD, alert, cooperative with exam, NCAT EYES: EOMI, no conjunctival injection, or no icterus ENT:  TMs pearly gray b/l, OP without erythema LYMPH: no cervical LAD CV: NRRR, normal S1/S2, no murmur Resp: CTABL, no wheezes, normal WOB, moving air well Abd: +BS, soft, NTND. no guarding or organomegaly Ext: No edema, warm Neuro: Alert and oriented  Assessment & Plan:  Crystal Wong was seen today for cough and nasal congestion.  Diagnoses and all orders for this visit:  Viral URI with cough Discussed symptomatic care Finish amox as prescribed -      guaiFENesin-codeine 100-10 MG/5ML syrup; Take 5-10 mLs by mouth 3 (three) times daily as needed for cough.  Yeast infection -     fluconazole (DIFLUCAN) 150 MG tablet; Take 1 tablet (150 mg total) by mouth every three (3) days as needed.  Uncomplicated asthma, unspecified asthma severity, unspecified whether persistent Use albuterol 3 tims a day while coughing and sick  Follow up plan: Return if symptoms worsen or fail to improve. Assunta Found, MD [facility]

## 2016-05-20 ENCOUNTER — Other Ambulatory Visit: Payer: Self-pay | Admitting: Nurse Practitioner

## 2016-05-20 DIAGNOSIS — K219 Gastro-esophageal reflux disease without esophagitis: Secondary | ICD-10-CM

## 2016-06-30 ENCOUNTER — Ambulatory Visit (INDEPENDENT_AMBULATORY_CARE_PROVIDER_SITE_OTHER): Payer: 59 | Admitting: Nurse Practitioner

## 2016-06-30 ENCOUNTER — Encounter: Payer: Self-pay | Admitting: Nurse Practitioner

## 2016-06-30 VITALS — BP 115/73 | HR 75 | Temp 97.8°F | Ht 66.0 in | Wt 194.0 lb

## 2016-06-30 DIAGNOSIS — F5101 Primary insomnia: Secondary | ICD-10-CM

## 2016-06-30 DIAGNOSIS — Z0000 Encounter for general adult medical examination without abnormal findings: Secondary | ICD-10-CM | POA: Diagnosis not present

## 2016-06-30 DIAGNOSIS — Z6831 Body mass index (BMI) 31.0-31.9, adult: Secondary | ICD-10-CM

## 2016-06-30 DIAGNOSIS — F411 Generalized anxiety disorder: Secondary | ICD-10-CM

## 2016-06-30 DIAGNOSIS — F3342 Major depressive disorder, recurrent, in full remission: Secondary | ICD-10-CM

## 2016-06-30 DIAGNOSIS — J45909 Unspecified asthma, uncomplicated: Secondary | ICD-10-CM

## 2016-06-30 DIAGNOSIS — K219 Gastro-esophageal reflux disease without esophagitis: Secondary | ICD-10-CM

## 2016-06-30 DIAGNOSIS — E782 Mixed hyperlipidemia: Secondary | ICD-10-CM

## 2016-06-30 MED ORDER — ROSUVASTATIN CALCIUM 40 MG PO TABS: 40.0000 mg | ORAL_TABLET | Freq: Every day | ORAL | 0 refills | Status: DC

## 2016-06-30 MED ORDER — CITALOPRAM HYDROBROMIDE 40 MG PO TABS: ORAL_TABLET | ORAL | 5 refills | Status: DC

## 2016-06-30 MED ORDER — MOMETASONE FURO-FORMOTEROL FUM 100-5 MCG/ACT IN AERO: 1.0000 | INHALATION_SPRAY | Freq: Once | RESPIRATORY_TRACT | 5 refills | Status: DC

## 2016-06-30 MED ORDER — PANTOPRAZOLE SODIUM 40 MG PO TBEC: DELAYED_RELEASE_TABLET | ORAL | 1 refills | Status: DC

## 2016-06-30 NOTE — Patient Instructions (Signed)
Health Maintenance, Female Adopting a healthy lifestyle and getting preventive care can go a long way to promote health and wellness. Talk with your health care provider about what schedule of regular examinations is right for you. This is a good chance for you to check in with your provider about disease prevention and staying healthy. In between checkups, there are plenty of things you can do on your own. Experts have done a lot of research about which lifestyle changes and preventive measures are most likely to keep you healthy. Ask your health care provider for more information. Weight and diet Eat a healthy diet  Be sure to include plenty of vegetables, fruits, low-fat dairy products, and lean protein.  Do not eat a lot of foods high in solid fats, added sugars, or salt.  Get regular exercise. This is one of the most important things you can do for your health.  Most adults should exercise for at least 150 minutes each week. The exercise should increase your heart rate and make you sweat (moderate-intensity exercise).  Most adults should also do strengthening exercises at least twice a week. This is in addition to the moderate-intensity exercise. Maintain a healthy weight  Body mass index (BMI) is a measurement that can be used to identify possible weight problems. It estimates body fat based on height and weight. Your health care provider can help determine your BMI and help you achieve or maintain a healthy weight.  For females 76 years of age and older:  A BMI below 18.5 is considered underweight.  A BMI of 18.5 to 24.9 is normal.  A BMI of 25 to 29.9 is considered overweight.  A BMI of 30 and above is considered obese. Watch levels of cholesterol and blood lipids  You should start having your blood tested for lipids and cholesterol at 60 years of age, then have this test every 5 years.  You may need to have your cholesterol levels checked more often if:  Your lipid or  cholesterol levels are high.  You are older than 59 years of age.  You are at high risk for heart disease. Cancer screening Lung Cancer  Lung cancer screening is recommended for adults 64-42 years old who are at high risk for lung cancer because of a history of smoking.  A yearly low-dose CT scan of the lungs is recommended for people who:  Currently smoke.  Have quit within the past 15 years.  Have at least a 30-pack-year history of smoking. A pack year is smoking an average of one pack of cigarettes a day for 1 year.  Yearly screening should continue until it has been 15 years since you quit.  Yearly screening should stop if you develop a health problem that would prevent you from having lung cancer treatment. Breast Cancer  Practice breast self-awareness. This means understanding how your breasts normally appear and feel.  It also means doing regular breast self-exams. Let your health care provider know about any changes, no matter how small.  If you are in your 20s or 30s, you should have a clinical breast exam (CBE) by a health care provider every 1-3 years as part of a regular health exam.  If you are 34 or older, have a CBE every year. Also consider having a breast X-ray (mammogram) every year.  If you have a family history of breast cancer, talk to your health care provider about genetic screening.  If you are at high risk for breast cancer, talk  to your health care provider about having an MRI and a mammogram every year.  Breast cancer gene (BRCA) assessment is recommended for women who have family members with BRCA-related cancers. BRCA-related cancers include:  Breast.  Ovarian.  Tubal.  Peritoneal cancers.  Results of the assessment will determine the need for genetic counseling and BRCA1 and BRCA2 testing. Cervical Cancer  Your health care provider may recommend that you be screened regularly for cancer of the pelvic organs (ovaries, uterus, and vagina).  This screening involves a pelvic examination, including checking for microscopic changes to the surface of your cervix (Pap test). You may be encouraged to have this screening done every 3 years, beginning at age 24.  For women ages 66-65, health care providers may recommend pelvic exams and Pap testing every 3 years, or they may recommend the Pap and pelvic exam, combined with testing for human papilloma virus (HPV), every 5 years. Some types of HPV increase your risk of cervical cancer. Testing for HPV may also be done on women of any age with unclear Pap test results.  Other health care providers may not recommend any screening for nonpregnant women who are considered low risk for pelvic cancer and who do not have symptoms. Ask your health care provider if a screening pelvic exam is right for you.  If you have had past treatment for cervical cancer or a condition that could lead to cancer, you need Pap tests and screening for cancer for at least 20 years after your treatment. If Pap tests have been discontinued, your risk factors (such as having a new sexual partner) need to be reassessed to determine if screening should resume. Some women have medical problems that increase the chance of getting cervical cancer. In these cases, your health care provider may recommend more frequent screening and Pap tests. Colorectal Cancer  This type of cancer can be detected and often prevented.  Routine colorectal cancer screening usually begins at 59 years of age and continues through 59 years of age.  Your health care provider may recommend screening at an earlier age if you have risk factors for colon cancer.  Your health care provider may also recommend using home test kits to check for hidden blood in the stool.  A small camera at the end of a tube can be used to examine your colon directly (sigmoidoscopy or colonoscopy). This is done to check for the earliest forms of colorectal cancer.  Routine  screening usually begins at age 41.  Direct examination of the colon should be repeated every 5-10 years through 59 years of age. However, you may need to be screened more often if early forms of precancerous polyps or small growths are found. Skin Cancer  Check your skin from head to toe regularly.  Tell your health care provider about any new moles or changes in moles, especially if there is a change in a mole's shape or color.  Also tell your health care provider if you have a mole that is larger than the size of a pencil eraser.  Always use sunscreen. Apply sunscreen liberally and repeatedly throughout the day.  Protect yourself by wearing long sleeves, pants, a wide-brimmed hat, and sunglasses whenever you are outside. Heart disease, diabetes, and high blood pressure  High blood pressure causes heart disease and increases the risk of stroke. High blood pressure is more likely to develop in:  People who have blood pressure in the high end of the normal range (130-139/85-89 mm Hg).  People who are overweight or obese.  People who are African American.  If you are 59-24 years of age, have your blood pressure checked every 3-5 years. If you are 34 years of age or older, have your blood pressure checked every year. You should have your blood pressure measured twice-once when you are at a hospital or clinic, and once when you are not at a hospital or clinic. Record the average of the two measurements. To check your blood pressure when you are not at a hospital or clinic, you can use:  An automated blood pressure machine at a pharmacy.  A home blood pressure monitor.  If you are between 29 years and 60 years old, ask your health care provider if you should take aspirin to prevent strokes.  Have regular diabetes screenings. This involves taking a blood sample to check your fasting blood sugar level.  If you are at a normal weight and have a low risk for diabetes, have this test once  every three years after 59 years of age.  If you are overweight and have a high risk for diabetes, consider being tested at a younger age or more often. Preventing infection Hepatitis B  If you have a higher risk for hepatitis B, you should be screened for this virus. You are considered at high risk for hepatitis B if:  You were born in a country where hepatitis B is common. Ask your health care provider which countries are considered high risk.  Your parents were born in a high-risk country, and you have not been immunized against hepatitis B (hepatitis B vaccine).  You have HIV or AIDS.  You use needles to inject street drugs.  You live with someone who has hepatitis B.  You have had sex with someone who has hepatitis B.  You get hemodialysis treatment.  You take certain medicines for conditions, including cancer, organ transplantation, and autoimmune conditions. Hepatitis C  Blood testing is recommended for:  Everyone born from [birth year] through 1963.  Anyone with known risk factors for hepatitis C. Sexually transmitted infections (STIs)  You should be screened for sexually transmitted infections (STIs) including gonorrhea and chlamydia if:  You are sexually active and are younger than 59 years of age.  You are older than 59 years of age and your health care provider tells you that you are at risk for this type of infection.  Your sexual activity has changed since you were last screened and you are at an increased risk for chlamydia or gonorrhea. Ask your health care provider if you are at risk.  If you do not have HIV, but are at risk, it may be recommended that you take a prescription medicine daily to prevent HIV infection. This is called pre-exposure prophylaxis (PrEP). You are considered at risk if:  You are sexually active and do not regularly use condoms or know the HIV status of your partner(s).  You take drugs by injection.  You are sexually active with a partner  who has HIV. Talk with your health care provider about whether you are at high risk of being infected with HIV. If you choose to begin PrEP, you should first be tested for HIV. You should then be tested every 3 months for as long as you are taking PrEP. Pregnancy  If you are premenopausal and you may become pregnant, ask your health care provider about preconception counseling.  If you may become pregnant, take 400 to 800 micrograms (mcg) of folic acid  every day.  If you want to prevent pregnancy, talk to your health care provider about birth control (contraception). Osteoporosis and menopause  Osteoporosis is a disease in which the bones lose minerals and strength with aging. This can result in serious bone fractures. Your risk for osteoporosis can be identified using a bone density scan.  If you are 4 years of age or older, or if you are at risk for osteoporosis and fractures, ask your health care provider if you should be screened.  Ask your health care provider whether you should take a calcium or vitamin D supplement to lower your risk for osteoporosis.  Menopause may have certain physical symptoms and risks.  Hormone replacement therapy may reduce some of these symptoms and risks. Talk to your health care provider about whether hormone replacement therapy is right for you. Follow these instructions at home:  Schedule regular health, dental, and eye exams.  Stay current with your immunizations.  Do not use any tobacco products including cigarettes, chewing tobacco, or electronic cigarettes.  If you are pregnant, do not drink alcohol.  If you are breastfeeding, limit how much and how often you drink alcohol.  Limit alcohol intake to no more than 1 drink per day for nonpregnant women. One drink equals 12 ounces of beer, 5 ounces of wine, or 1 ounces of hard liquor.  Do not use street drugs.  Do not share needles.  Ask your health care provider for help if you need support  or information about quitting drugs.  Tell your health care provider if you often feel depressed.  Tell your health care provider if you have ever been abused or do not feel safe at home. This information is not intended to replace advice given to you by your health care provider. Make sure you discuss any questions you have with your health care provider. Document Released: 09/05/2010 Document Revised: 07/29/2015 Document Reviewed: 11/24/2014 Elsevier Interactive Patient Education  2017 [employer].

## 2016-06-30 NOTE — Progress Notes (Addendum)
Subjective:    Patient ID: Crystal Wong, female    DOB: Nov 04, 1955, 61 y.o.   MRN: [id number]  HPI  TARRY BLAYNEY is here today for follow up of chronic medical problem.  Outpatient Encounter Prescriptions as of 06/30/2016  Medication Sig  . albuterol (PROVENTIL HFA;VENTOLIN HFA) 108 (90 BASE) MCG/ACT inhaler Inhale 2 puffs into the lungs every 6 (six) hours as needed for wheezing.  Marland Kitchen amoxicillin (AMOXIL) 500 MG capsule Take 1 capsule (500 mg total) by mouth 3 (three) times daily.  . citalopram (CELEXA) 40 MG tablet TAKE ONE TABLET BY MOUTH ONE TIME DAILY MUST BE SEEN FOR NEXT REFILL  . fluconazole (DIFLUCAN) 150 MG tablet Take 1 tablet (150 mg total) by mouth every three (3) days as needed.  . fluticasone (FLONASE) 50 MCG/ACT nasal spray Place 2 sprays into both nostrils daily.  Marland Kitchen guaiFENesin-codeine 100-10 MG/5ML syrup Take 5-10 mLs by mouth 3 (three) times daily as needed for cough.  . loratadine-pseudoephedrine (CLARITIN-D 24 HOUR) 10-240 MG 24 hr tablet Take 1 tablet by mouth daily.  Marland Kitchen LORazepam (ATIVAN) 1 MG tablet TAKE 1 TABLET BY MOUTH EVERY EIGHT HOURS AS NEEDED  . mometasone-formoterol (DULERA) 100-5 MCG/ACT AERO Inhale 1 puff into the lungs once.  . pantoprazole (PROTONIX) 40 MG tablet TAKE 1 TABLET (40 MG TOTAL) BY MOUTH 2 (TWO) TIMES DAILY.  . rosuvastatin (CRESTOR) 40 MG tablet Take 1 tablet (40 mg total) by mouth daily.   No facility-administered encounter medications on file as of 06/30/2016.     1. Uncomplicated asthma, unspecified asthma severity, unspecified whether persistent  Has frequent flare ups during this time of year but claritin helps  2. Gastroesophageal reflux disease without esophagitis  Currently on protonix- works well to keep symptoms under control.  3. BMI 33.0-33.9,adult   weight down 21 lbs- on low carb diet   4. Recurrent major depressive disorder, in full remission [facility])   is currently on celexa which she has been on for awhile- works well for hr  - no side effects Depression screen [facility] 2/9 06/30/2016 04/24/2016 04/05/2016 03/02/2016 12/17/2015  Decreased Interest 0 0 0 0 0  Down, Depressed, Hopeless 0 0 0 0 0  PHQ - 2 Score 0 0 0 0 0     5. GAD (generalized anxiety disorder)   takes ativan on occasion- only when she needs it. Helps her sleep at night as well  6. Mixed hyperlipidemia   does not watch diet very closely  7. Primary insomnia   not taking anything other than the ativan at times    New complaints: Saw Dr. Nickola Major for right knee pain yesterday and had injection done and is scheduled for MRI next week.     Review of Systems  Constitutional: Negative for diaphoresis.  Eyes: Negative for pain.  Respiratory: Negative for shortness of breath.   Cardiovascular: Negative for chest pain, palpitations and leg swelling.  Gastrointestinal: Negative for abdominal pain.  Endocrine: Negative for polydipsia.  Musculoskeletal: Positive for joint swelling (right knee).  Skin: Negative for rash.  Neurological: Negative for dizziness, weakness and headaches.  Hematological: Does not bruise/bleed easily.  All other systems reviewed and are negative.      Objective:   Physical Exam  Constitutional: She is oriented to person, place, and time. She appears well-developed and well-nourished.  HENT:  Head: Normocephalic.  Right Ear: Hearing, tympanic membrane, external ear and ear canal normal.  Left Ear: Hearing, tympanic membrane, external ear and ear  canal normal.  Nose: Nose normal.  Mouth/Throat: Uvula is midline and oropharynx is clear and moist.  Eyes: Conjunctivae and EOM are normal. Pupils are equal, round, and reactive to light.  Neck: Normal range of motion and full passive range of motion without pain. Neck supple. No JVD present. Carotid bruit is not present. No thyroid mass and no thyromegaly present.  Cardiovascular: Normal rate, normal heart sounds and intact distal pulses.   No murmur heard. Pulmonary/Chest:  Effort normal and breath sounds normal. Right breast exhibits no inverted nipple, no mass, no nipple discharge, no skin change and no tenderness. Left breast exhibits no inverted nipple, no mass, no nipple discharge, no skin change and no tenderness.  Abdominal: Soft. Bowel sounds are normal. She exhibits no mass. There is no tenderness.  Genitourinary: No breast swelling, tenderness, discharge or bleeding.  Genitourinary Comments: bimanual exam-No adnexal masses or tenderness.   Musculoskeletal: Normal range of motion.  Mild right knee effusion- pain on flexion and extension  Lymphadenopathy:    She has no cervical adenopathy.  Neurological: She is alert and oriented to person, place, and time.  Skin: Skin is warm and dry.  Psychiatric: She has a normal mood and affect. Her behavior is normal. Judgment and thought content normal.    BP 115/73   Pulse 75   Temp 97.8 F (36.6 C) (Oral)   Ht _0  (1.676 m)   Wt 194 lb (88 kg)   BMI 31.31 kg/m       Assessment & Plan:  1. Uncomplicated asthma, unspecified asthma severity, unspecified whether persistent - mometasone-formoterol (DULERA) 100-5 MCG/ACT AERO; Inhale 1 puff into the lungs once.  Dispense: 1 Inhaler; Refill: 5  2. Gastroesophageal reflux disease without esophagitis Avoid spicy foods Do not eat 2 hours prior to bedtime - pantoprazole (PROTONIX) 40 MG tablet; TAKE 1 TABLET (40 MG TOTAL) BY MOUTH 2 (TWO) TIMES DAILY.  Dispense: 180 tablet; Refill: 1  3. BMI 31.0-31.9,adult Discussed diet and exercise for person with BMI >25 Will recheck weight in 3-6 months  4. Recurrent major depressive disorder, in full remission ([place]) Stress management - citalopram (CELEXA) 40 MG tablet; TAKE ONE TABLET BY MOUTH ONE TIME DAILY MUST BE SEEN FOR NEXT REFILL  Dispense: 30 tablet; Refill: 5  5. GAD (generalized anxiety disorder) Stress management  6. Mixed hyperlipidemia Low fat diet - rosuvastatin (CRESTOR) 40 MG tablet; Take 1  tablet (40 mg total) by mouth daily.  Dispense: 90 tablet; Refill: 0 - CMP14+EGFR - Lipid panel  7. Primary insomnia Bedtime routine  8. Annual physical exam Orders Placed This Encounter  Procedures  . CMP14+EGFR  . Lipid panel  . CBC with Differential/Platelet  . Thyroid Panel With TSH  . VITAMIN D 25 Hydroxy (Vit-D Deficiency, Fractures)     Labs pending Health maintenance reviewed Diet and exercise encouraged Continue all meds Follow up  In 6 months   [place], FNP

## 2016-06-30 NOTE — Addendum Note (Signed)
Addended by: Chevis Pretty on: 06/30/2016 10:27 AM   Modules accepted: Orders

## 2016-07-01 LAB — CBC WITH DIFFERENTIAL/PLATELET
BASOS ABS: 0 10*3/uL (ref 0.0–0.2)
Basos: 1 %
EOS (ABSOLUTE): 0.1 10*3/uL (ref 0.0–0.4)
Eos: 2 %
Hematocrit: 39.4 % (ref 34.0–46.6)
Hemoglobin: 13.6 g/dL (ref 11.1–15.9)
IMMATURE GRANS (ABS): 0 10*3/uL (ref 0.0–0.1)
Immature Granulocytes: 0 %
LYMPHS: 25 %
Lymphocytes Absolute: 1.5 10*3/uL (ref 0.7–3.1)
MCH: 28.6 pg (ref 26.6–33.0)
MCHC: 34.5 g/dL (ref 31.5–35.7)
MCV: 83 fL (ref 79–97)
Monocytes Absolute: 0.3 10*3/uL (ref 0.1–0.9)
Monocytes: 6 %
NEUTROS ABS: 3.8 10*3/uL (ref 1.4–7.0)
Neutrophils: 66 %
Platelets: 271 10*3/uL (ref 150–379)
RBC: 4.75 x10E6/uL (ref 3.77–5.28)
RDW: 14.1 % (ref 12.3–15.4)
WBC: 5.7 10*3/uL (ref 3.4–10.8)

## 2016-07-01 LAB — CMP14+EGFR
ALBUMIN: 4.5 g/dL (ref 3.5–5.5)
ALK PHOS: 83 IU/L (ref 39–117)
ALT: 14 IU/L (ref 0–32)
AST: 15 IU/L (ref 0–40)
Albumin/Globulin Ratio: 1.7 (ref 1.2–2.2)
BUN / CREAT RATIO: 21 (ref 9–23)
BUN: 19 mg/dL (ref 6–24)
Bilirubin Total: 0.3 mg/dL (ref 0.0–1.2)
CALCIUM: 10.1 mg/dL (ref 8.7–10.2)
CO2: 26 mmol/L (ref 18–29)
CREATININE: 0.92 mg/dL (ref 0.57–1.00)
Chloride: 100 mmol/L (ref 96–106)
GFR calc Af Amer: 79 mL/min/{1.73_m2} (ref >59)
GFR, EST NON AFRICAN AMERICAN: 69 mL/min/{1.73_m2} (ref >59)
GLOBULIN, TOTAL: 2.6 g/dL (ref 1.5–4.5)
GLUCOSE: 99 mg/dL (ref 65–99)
Potassium: 4.8 mmol/L (ref 3.5–5.2)
SODIUM: 140 mmol/L (ref 134–144)
TOTAL PROTEIN: 7.1 g/dL (ref 6.0–8.5)

## 2016-07-01 LAB — VITAMIN D 25 HYDROXY (VIT D DEFICIENCY, FRACTURES): VIT D 25 HYDROXY: 56.9 ng/mL (ref 30.0–100.0)

## 2016-07-01 LAB — LIPID PANEL
CHOL/HDL RATIO: 4.8 ratio — AB (ref 0.0–4.4)
CHOLESTEROL TOTAL: 224 mg/dL — AB (ref 100–199)
HDL: 47 mg/dL (ref >39)
LDL Calculated: 163 mg/dL — ABNORMAL HIGH (ref 0–99)
Triglycerides: 72 mg/dL (ref 0–149)
VLDL CHOLESTEROL CAL: 14 mg/dL (ref 5–40)

## 2016-07-01 LAB — THYROID PANEL WITH TSH
FREE THYROXINE INDEX: 1.7 (ref 1.2–4.9)
T3 Uptake Ratio: 26 % (ref 24–39)
T4 TOTAL: 6.6 ug/dL (ref 4.5–12.0)
TSH: 1.41 u[IU]/mL (ref 0.450–4.500)

## 2016-07-03 NOTE — Progress Notes (Signed)
Done 08/03/2015

## 2016-07-12 ENCOUNTER — Other Ambulatory Visit: Payer: Self-pay | Admitting: Family Medicine

## 2016-08-02 ENCOUNTER — Other Ambulatory Visit: Payer: Self-pay | Admitting: Physician Assistant

## 2016-08-02 DIAGNOSIS — F419 Anxiety disorder, unspecified: Secondary | ICD-10-CM

## 2016-08-02 NOTE — Telephone Encounter (Signed)
Last seen MMM for CPE 06/30/16. Please address refill and send to pool

## 2016-08-03 ENCOUNTER — Telehealth: Payer: Self-pay | Admitting: Nurse Practitioner

## 2016-08-03 NOTE — Telephone Encounter (Signed)
Phoned in.

## 2016-08-03 NOTE — Telephone Encounter (Signed)
Please call in lorazepam with 1 refills 

## 2016-08-03 NOTE — Telephone Encounter (Signed)
Pharmacy was calling to make sure Crystal Wong was aware pt was on Hydrocodone prior to filling the Lorazepam. Crystal Wong is aware as pt received from ortho for right knee arthroscopy. Pharmacy will fill the lorazepam.

## 2016-10-12 ENCOUNTER — Other Ambulatory Visit: Payer: Self-pay | Admitting: Nurse Practitioner

## 2016-10-12 DIAGNOSIS — F419 Anxiety disorder, unspecified: Secondary | ICD-10-CM

## 2016-10-12 DIAGNOSIS — E782 Mixed hyperlipidemia: Secondary | ICD-10-CM

## 2016-10-13 ENCOUNTER — Other Ambulatory Visit: Payer: Self-pay | Admitting: Nurse Practitioner

## 2016-10-13 DIAGNOSIS — F419 Anxiety disorder, unspecified: Secondary | ICD-10-CM

## 2016-10-13 NOTE — Telephone Encounter (Signed)
Refill called to [facility] 

## 2016-10-13 NOTE — Telephone Encounter (Signed)
Last seen 06/30/16  MMM

## 2016-10-13 NOTE — Telephone Encounter (Signed)
Please call in alprozolam with 1 refills 

## 2016-11-02 ENCOUNTER — Ambulatory Visit (INDEPENDENT_AMBULATORY_CARE_PROVIDER_SITE_OTHER): Payer: 59 | Admitting: Nurse Practitioner

## 2016-11-02 ENCOUNTER — Encounter: Payer: Self-pay | Admitting: Nurse Practitioner

## 2016-11-02 VITALS — BP 125/80 | HR 77 | Temp 97.1°F | Ht 66.0 in | Wt 182.0 lb

## 2016-11-02 DIAGNOSIS — T700XXA Otitic barotrauma, initial encounter: Secondary | ICD-10-CM

## 2016-11-02 DIAGNOSIS — J069 Acute upper respiratory infection, unspecified: Secondary | ICD-10-CM | POA: Diagnosis not present

## 2016-11-02 DIAGNOSIS — S80212A Abrasion, left knee, initial encounter: Secondary | ICD-10-CM | POA: Diagnosis not present

## 2016-11-02 DIAGNOSIS — B373 Candidiasis of vulva and vagina: Secondary | ICD-10-CM | POA: Diagnosis not present

## 2016-11-02 DIAGNOSIS — B3731 Acute candidiasis of vulva and vagina: Secondary | ICD-10-CM

## 2016-11-02 MED ORDER — FLUCONAZOLE 150 MG PO TABS: 150.0000 mg | ORAL_TABLET | Freq: Once | ORAL | 0 refills | Status: AC

## 2016-11-02 MED ORDER — METHYLPREDNISOLONE ACETATE 80 MG/ML IJ SUSP
80.0000 mg | Freq: Once | INTRAMUSCULAR | Status: AC
  Administered 2016-11-02: 80 mg via INTRAMUSCULAR

## 2016-11-02 NOTE — Patient Instructions (Signed)
Barotitis Media Barotitis media is inflammation of the middle ear. This condition occurs when an auditory tube (eustachian tube) is blocked in one or both ears. These tubes lead from the middle ear to the back of the nose (nasopharynx). This condition typically occurs when you experience changes in pressure, such as when flying or scuba diving. Untreated barotitis media may lead to damage or hearing loss (barotrauma), which may become permanent. What are the causes? This condition may be caused by changes in air pressure from:  Flying.  Scuba diving.  A nearby explosion. What increases the risk? The following factors may make you more likely to develop this condition:  Middle ear infection.  Sinus infection.  A cold.  Environmental allergies.  Small eustachian tubes.  Recent ear surgery. What are the signs or symptoms? Symptoms of this condition may include:  Ear pain.  Hearing loss. In severe cases, symptoms can include:  Dizziness and nausea (vertigo).  Temporary facial paralysis. How is this diagnosed? This condition is diagnosed based on:  A physical exam. Your health care provider may:  Use a device (otoscope) to look into your ear canal and check your eardrum.  Do a test that changes air pressure in the middle ear to check how well the eardrum moves and to see if the eustachian tube is working(tympanogram).  Your medical history. In some cases, your health care provider may have you take a hearing test. You may also be referred to someone who specializes in ear treatment (otolaryngologist, "ENT"). How is this treated? This condition may be treated with:  Medicines to relieve congestion in your nose, sinus, or upper respiratory tract (decongestants).  Techniques to equalize pressure (to "pop" your ears), such as:  Yawning.  Chewing gum.  Swallowing. In severe cases, you may need surgery to relieve your symptoms or to prevent future inflammation. Follow  these instructions at home:  Take over-the-counter and prescription medicines only as told by your health care provider.  Do not put anything into your ears to clean or unplug them. Ear drops will not help.  Keep all follow-up visits as told by your health care provider. This is important. How is this prevented? Using these strategies may help to prevent barotitis media:  Chewing gum with frequent, forceful swallowing during takeoff and landing when flying.  Holding your nose and gently blowing to pop your ears for equalizing pressure changes. This forces air into the eustachian tube.  Yawning during air pressure changes.  Using a nasal decongestant about 30-60 minutes before flying, if you have nasal congestion. Contact a health care provider if:  You have vertigo.  You have hearing loss.  Your symptoms do not get better or they get worse.  You have a fever. Get help right away if:  You have a severe headache, ear pain, and dizziness.  You have balance problems.  You cannot move or feel part of your face.  You have bloody or pus-like drainage from your ears. Summary  Barotitis media is inflammation of the middle ear.  This condition typically occurs when you experience changes in pressure, such as when flying or scuba diving.  You may be at a higher risk for this condition if you have small eustachian tubes, had recent ear surgery, or have allergies, a cold, or sinus or middle ear infection.  This condition may be treated with medicines or techniques to equalize pressure in your ears.  Strategies can be used to help prevent barotitis media. This information is   not intended to replace advice given to you by your health care provider. Make sure you discuss any questions you have with your health care provider. Document Released: 02/18/2000 Document Revised: 01/10/2016 Document Reviewed: 01/10/2016 Elsevier Interactive Patient Education  2017 [employer]  

## 2016-11-02 NOTE — Progress Notes (Signed)
   Subjective:    Patient ID: Crystal Wong, female    DOB: 1955-07-23, 61 y.o.   MRN: [id number]  HPI Patient in the office with complaints of cough, nasal congestion, and some tightness in the chest.  She just got home from a cruise (17-day) last night and was treated with a 5-day course of azithromycin (10/28/16) which she states started helping.    She also fell and hurt her left knee while on the cruise and scraped her knee on the [address].  She is not worried about the pain, but is wondering if she needs a tetanus shot for the injury.  She also thinks she may have a yeast infection.  Symptoms began today with itching, denies discharge.  She says this feels like the start of a yeast infection as previous.   Review of Systems  Constitutional: Negative for activity change, appetite change and fever.  HENT: Positive for congestion (nasal congestion x 5 days), ear pain (bilateral pain 5 days), rhinorrhea (mostly clear) and sinus pressure (minor).   Genitourinary: Negative for vaginal discharge.       Vaginal itching started this morning   All other systems reviewed and are negative.      Objective:   Physical Exam  Constitutional: She is oriented to person, place, and time. She appears well-developed and well-nourished. No distress.  HENT:  Head: Normocephalic.  Right Ear: External ear normal.  Left Ear: External ear normal.  Mouth/Throat: Oropharynx is clear and moist.  Eyes: Pupils are equal, round, and reactive to light.  Neck: Normal range of motion. Neck supple. No thyromegaly present.  Cardiovascular: Normal rate, regular rhythm and normal heart sounds.   Pulmonary/Chest: Effort normal and breath sounds normal. No respiratory distress. She has no wheezes.  Lymphadenopathy:    She has no cervical adenopathy.  Neurological: She is alert and oriented to person, place, and time.  Skin: Skin is warm and dry.  Psychiatric: She has a normal mood and affect. Her behavior  is normal.   BP 125/80   Pulse 77   Temp (!) 97.1 F (36.2 C) (Oral)   Ht 5\' 6"  (1.676 m)   Wt 182 lb (82.6 kg)   BMI 29.38 kg/m     Assessment & Plan:  1. Vaginal candidiasis - fluconazole (DIFLUCAN) 150 MG tablet; Take 1 tablet (150 mg total) by mouth once.  Dispense: 1 tablet; Refill: 0  2. Viral upper respiratory tract infection 1. Take meds as prescribed 2. Use a cool mist humidifier especially during the winter months and when heat has been humid. 3. Use saline nose sprays frequently 4. Saline irrigations of the nose can be very helpful if done frequently.  * 4X daily for 1 week*  * Use of a nettie pot can be helpful with this. Follow directions with this* 5. Drink plenty of fluids 6. Keep thermostat turn down low 7.For any cough or congestion  Use plain Mucinex- regular strength or max strength is fine   * Children- consult with Pharmacist for dosing 8. For fever or aces or pains- take tylenol or ibuprofen appropriate for age and weight.  * for fevers greater than 101 orally you may alternate ibuprofen and tylenol every  3 hours.    - methylPREDNISolone acetate (DEPO-MEDROL) injection 80 mg; Inject 1 mL (80 mg total) into the muscle once.  3.barotitis  chew gum  [name] Done, FNP

## 2016-11-07 ENCOUNTER — Telehealth: Payer: Self-pay | Admitting: Nurse Practitioner

## 2016-11-07 MED ORDER — AMOXICILLIN 875 MG PO TABS: 875.0000 mg | ORAL_TABLET | Freq: Two times a day (BID) | ORAL | 0 refills | Status: DC

## 2016-11-07 NOTE — Telephone Encounter (Signed)
Pt aware.

## 2016-11-07 NOTE — Telephone Encounter (Signed)
amoxicillin sent to pharmacy

## 2016-12-24 ENCOUNTER — Other Ambulatory Visit: Payer: Self-pay | Admitting: Nurse Practitioner

## 2016-12-24 DIAGNOSIS — F419 Anxiety disorder, unspecified: Secondary | ICD-10-CM

## 2016-12-25 NOTE — Telephone Encounter (Signed)
Called to [facility] 

## 2016-12-25 NOTE — Telephone Encounter (Signed)
Please call in diazepam with 1 refills

## 2016-12-25 NOTE — Telephone Encounter (Signed)
Last seen 11/02/16  MMM  IF approved route to nurse to call into [facility]

## 2017-01-08 ENCOUNTER — Other Ambulatory Visit: Payer: Self-pay | Admitting: Nurse Practitioner

## 2017-01-08 DIAGNOSIS — E782 Mixed hyperlipidemia: Secondary | ICD-10-CM

## 2017-01-08 NOTE — Telephone Encounter (Signed)
Message left for patient that she will need to be seen for further refills

## 2017-01-08 NOTE — Telephone Encounter (Signed)
Last refill without being seen 

## 2017-02-23 ENCOUNTER — Ambulatory Visit: Payer: 59 | Admitting: Nurse Practitioner

## 2017-03-03 ENCOUNTER — Other Ambulatory Visit: Payer: Self-pay | Admitting: Nurse Practitioner

## 2017-03-03 DIAGNOSIS — F419 Anxiety disorder, unspecified: Secondary | ICD-10-CM

## 2017-03-07 NOTE — Telephone Encounter (Signed)
Last refill without being seen 

## 2017-03-20 ENCOUNTER — Other Ambulatory Visit: Payer: Self-pay | Admitting: Nurse Practitioner

## 2017-03-20 DIAGNOSIS — K219 Gastro-esophageal reflux disease without esophagitis: Secondary | ICD-10-CM

## 2017-03-20 DIAGNOSIS — F3342 Major depressive disorder, recurrent, in full remission: Secondary | ICD-10-CM

## 2017-03-20 NOTE — Telephone Encounter (Signed)
Last seen 11/02/16  MMM

## 2017-04-05 ENCOUNTER — Other Ambulatory Visit: Payer: Self-pay | Admitting: *Deleted

## 2017-04-05 DIAGNOSIS — E782 Mixed hyperlipidemia: Secondary | ICD-10-CM

## 2017-04-06 ENCOUNTER — Other Ambulatory Visit: Payer: Self-pay | Admitting: *Deleted

## 2017-04-06 DIAGNOSIS — E782 Mixed hyperlipidemia: Secondary | ICD-10-CM

## 2017-04-06 MED ORDER — ROSUVASTATIN CALCIUM 40 MG PO TABS: 40.0000 mg | ORAL_TABLET | Freq: Every day | ORAL | 0 refills | Status: DC

## 2017-04-18 ENCOUNTER — Other Ambulatory Visit: Payer: Self-pay | Admitting: Pediatrics

## 2017-04-18 DIAGNOSIS — B379 Candidiasis, unspecified: Secondary | ICD-10-CM

## 2017-04-18 NOTE — Telephone Encounter (Signed)
Last seen 11/02/16  MMM

## 2017-04-19 ENCOUNTER — Other Ambulatory Visit: Payer: Self-pay | Admitting: *Deleted

## 2017-04-19 DIAGNOSIS — F3342 Major depressive disorder, recurrent, in full remission: Secondary | ICD-10-CM

## 2017-04-19 MED ORDER — CITALOPRAM HYDROBROMIDE 40 MG PO TABS: ORAL_TABLET | ORAL | 0 refills | Status: DC

## 2017-05-01 ENCOUNTER — Encounter: Payer: Self-pay | Admitting: Nurse Practitioner

## 2017-05-01 ENCOUNTER — Ambulatory Visit: Payer: 59 | Admitting: Nurse Practitioner

## 2017-05-01 VITALS — BP 118/74 | HR 72 | Temp 97.5°F | Ht 66.0 in | Wt 201.0 lb

## 2017-05-01 DIAGNOSIS — Z6833 Body mass index (BMI) 33.0-33.9, adult: Secondary | ICD-10-CM | POA: Diagnosis not present

## 2017-05-01 DIAGNOSIS — R739 Hyperglycemia, unspecified: Secondary | ICD-10-CM

## 2017-05-01 DIAGNOSIS — F3342 Major depressive disorder, recurrent, in full remission: Secondary | ICD-10-CM | POA: Diagnosis not present

## 2017-05-01 DIAGNOSIS — F411 Generalized anxiety disorder: Secondary | ICD-10-CM | POA: Diagnosis not present

## 2017-05-01 DIAGNOSIS — F5101 Primary insomnia: Secondary | ICD-10-CM | POA: Diagnosis not present

## 2017-05-01 DIAGNOSIS — E782 Mixed hyperlipidemia: Secondary | ICD-10-CM

## 2017-05-01 DIAGNOSIS — K219 Gastro-esophageal reflux disease without esophagitis: Secondary | ICD-10-CM | POA: Diagnosis not present

## 2017-05-01 DIAGNOSIS — J4521 Mild intermittent asthma with (acute) exacerbation: Secondary | ICD-10-CM | POA: Diagnosis not present

## 2017-05-01 LAB — BAYER DCA HB A1C WAIVED: HB A1C (BAYER DCA - WAIVED): 5.5 % (ref <7.0)

## 2017-05-01 MED ORDER — MOMETASONE FURO-FORMOTEROL FUM 100-5 MCG/ACT IN AERO: 1.0000 | INHALATION_SPRAY | Freq: Once | RESPIRATORY_TRACT | 5 refills | Status: DC

## 2017-05-01 MED ORDER — ALBUTEROL SULFATE HFA 108 (90 BASE) MCG/ACT IN AERS: 2.0000 | INHALATION_SPRAY | Freq: Four times a day (QID) | RESPIRATORY_TRACT | 1 refills | Status: DC | PRN

## 2017-05-01 MED ORDER — LORAZEPAM 1 MG PO TABS: 1.0000 mg | ORAL_TABLET | Freq: Three times a day (TID) | ORAL | 2 refills | Status: DC | PRN

## 2017-05-01 MED ORDER — CITALOPRAM HYDROBROMIDE 40 MG PO TABS: ORAL_TABLET | ORAL | 1 refills | Status: DC

## 2017-05-01 MED ORDER — PANTOPRAZOLE SODIUM 40 MG PO TBEC: 40.0000 mg | DELAYED_RELEASE_TABLET | Freq: Two times a day (BID) | ORAL | 1 refills | Status: DC

## 2017-05-01 MED ORDER — ROSUVASTATIN CALCIUM 40 MG PO TABS: 40.0000 mg | ORAL_TABLET | Freq: Every day | ORAL | 1 refills | Status: DC

## 2017-05-01 MED ORDER — CITALOPRAM HYDROBROMIDE 40 MG PO TABS: ORAL_TABLET | ORAL | 5 refills | Status: DC

## 2017-05-01 NOTE — Patient Instructions (Signed)
Fat and Cholesterol Restricted Diet High levels of fat and cholesterol in your blood may lead to various health problems, such as diseases of the heart, blood vessels, gallbladder, liver, and pancreas. Fats are concentrated sources of energy that come in various forms. Certain types of fat, including saturated fat, may be harmful in excess. Cholesterol is a substance needed by your body in small amounts. Your body makes all the cholesterol it needs. Excess cholesterol comes from the food you eat. When you have high levels of cholesterol and saturated fat in your blood, health problems can develop because the excess fat and cholesterol will gather along the walls of your blood vessels, causing them to narrow. Choosing the right foods will help you control your intake of fat and cholesterol. This will help keep the levels of these substances in your blood within normal limits and reduce your risk of disease. What is my plan? Your health care provider recommends that you:  Limit your fat intake to ______% or less of your total calories per day.  Limit the amount of cholesterol in your diet to less than _________mg per day.  Eat 20-30 grams of fiber each day.  What types of fat should I choose?  Choose healthy fats more often. Choose monounsaturated and polyunsaturated fats, such as olive and canola oil, flaxseeds, walnuts, almonds, and seeds.  Eat more omega-3 fats. Good choices include salmon, mackerel, sardines, tuna, flaxseed oil, and ground flaxseeds. Aim to eat fish at least two times a week.  Limit saturated fats. Saturated fats are primarily found in animal products, such as meats, butter, and cream. Plant sources of saturated fats include palm oil, palm kernel oil, and coconut oil.  Avoid foods with partially hydrogenated oils in them. These contain trans fats. Examples of foods that contain trans fats are stick margarine, some tub margarines, cookies, crackers, and other baked goods. What  general guidelines do I need to follow? These guidelines for healthy eating will help you control your intake of fat and cholesterol:  Check food labels carefully to identify foods with trans fats or high amounts of saturated fat.  Fill one half of your plate with vegetables and green salads.  Fill one fourth of your plate with whole grains. Look for the word "whole" as the first word in the ingredient list.  Fill one fourth of your plate with lean protein foods.  Limit fruit to two servings a day. Choose fruit instead of juice.  Eat more foods that contain fiber, such as apples, broccoli, carrots, beans, peas, and barley.  Eat more home-cooked food and less restaurant, buffet, and fast food.  Limit or avoid alcohol.  Limit foods high in starch and sugar.  Limit fried foods.  Cook foods using methods other than frying. Baking, boiling, grilling, and broiling are all great options.  Lose weight if you are overweight. Losing just 5-10% of your initial body weight can help your overall health and prevent diseases such as diabetes and heart disease.  What foods can I eat? Grains  Whole grains, such as whole wheat or whole grain breads, crackers, cereals, and pasta. Unsweetened oatmeal, bulgur, barley, quinoa, or brown rice. Corn or whole wheat flour tortillas. Vegetables  Fresh or frozen vegetables (raw, steamed, roasted, or grilled). Green salads. Fruits  All fresh, canned (in natural juice), or frozen fruits. Meats and other protein foods  Ground beef (85% or leaner), grass-fed beef, or beef trimmed of fat. Skinless chicken or turkey. Ground chicken or turkey.   Pork trimmed of fat. All fish and seafood. Eggs. Dried beans, peas, or lentils. Unsalted nuts or seeds. Unsalted canned or dry beans. Dairy  Low-fat dairy products, such as skim or 1% milk, 2% or reduced-fat cheeses, low-fat ricotta or cottage cheese, or plain low-fat yo Fats and oils  Tub margarines without trans  fats. Light or reduced-fat mayonnaise and salad dressings. Avocado. Olive, canola, sesame, or safflower oils. Natural peanut or almond butter (choose ones without added sugar and oil). The items listed above may not be a complete list of recommended foods or beverages. Contact your dietitian for more options. Foods to avoid Grains  White bread. White pasta. White rice. Cornbread. Bagels, pastries, and croissants. Crackers that contain trans fat. Vegetables  White potatoes. Corn. Creamed or fried vegetables. Vegetables in a cheese sauce. Fruits  Dried fruits. Canned fruit in light or heavy syrup. Fruit juice. Meats and other protein foods  Fatty cuts of meat. Ribs, chicken wings, bacon, sausage, bologna, salami, chitterlings, fatback, hot dogs, bratwurst, and packaged luncheon meats. Liver and organ meats. Dairy  Whole or 2% milk, cream, half-and-half, and cream cheese. Whole milk cheeses. Whole-fat or sweetened yogurt. Full-fat cheeses. Nondairy creamers and whipped toppings. Processed cheese, cheese spreads, or cheese curds. Beverages  Alcohol. Sweetened drinks (such as sodas, lemonade, and fruit drinks or punches). Fats and oils  Butter, stick margarine, lard, shortening, ghee, or bacon fat. Coconut, palm kernel, or palm oils. Sweets and desserts  Corn syrup, sugars, honey, and molasses. Candy. Jam and jelly. Syrup. Sweetened cereals. Cookies, pies, cakes, donuts, muffins, and ice cream. The items listed above may not be a complete list of foods and beverages to avoid. Contact your dietitian for more information. This information is not intended to replace advice given to you by your health care provider. Make sure you discuss any questions you have with your health care provider. Document Released: 02/20/2005 Document Revised: 03/13/2014 Document Reviewed: 05/21/2013 Elsevier Interactive Patient Education  2018 Elsevier Inc.  

## 2017-05-01 NOTE — Progress Notes (Signed)
Subjective:    Patient ID: Crystal Wong, female    DOB: 11/28/1955, 62 y.o.   MRN: [id number]  HPI  CAREE WOLPERT is here today for follow up of chronic medical problem.  Outpatient Encounter Medications as of 05/01/2017  Medication Sig  . albuterol (PROVENTIL HFA;VENTOLIN HFA) 108 (90 BASE) MCG/ACT inhaler Inhale 2 puffs into the lungs every 6 (six) hours as needed for wheezing.  Marland Kitchen amoxicillin (AMOXIL) 875 MG tablet Take 1 tablet (875 mg total) by mouth 2 (two) times daily. 1 po BID  . citalopram (CELEXA) 40 MG tablet TAKE ONE TABLET BY MOUTH ONE TIME DAILy  . fluconazole (DIFLUCAN) 150 MG tablet TAKE 1 TABLET (150 MG TOTAL) BY MOUTH EVERY THREE (3) DAYS AS NEEDED.  . fluticasone (FLONASE) 50 MCG/ACT nasal spray Place 2 sprays into both nostrils daily.  Marland Kitchen LORATADINE-D 24HR 10-240 MG 24 hr tablet TAKE ONE TABLET BY MOUTH ONCE DAILY  . loratadine-pseudoephedrine (CLARITIN-D 24 HOUR) 10-240 MG 24 hr tablet Take 1 tablet by mouth daily.  Marland Kitchen LORazepam (ATIVAN) 1 MG tablet TAKE 1 TABLET BY MOUTH EVERY 8 HOURS AS NEEDED  . mometasone-formoterol (DULERA) 100-5 MCG/ACT AERO Inhale 1 puff into the lungs once.  . pantoprazole (PROTONIX) 40 MG tablet TAKE 1 TABLET (40 MG TOTAL) BY MOUTH 2 (TWO) TIMES DAILY.  . rosuvastatin (CRESTOR) 40 MG tablet Take 1 tablet (40 mg total) by mouth daily.     1. Uncomplicated asthma, unspecified asthma severity, unspecified whether persistent  She is currently on dulera inhaler and use albuterol as needed. She has had to use her albuterol a couple of times lately but says she is okay  2. Gastroesophageal reflux disease without esophagitis  Is currently on protonix  3. BMI 33.0-33.9,adult  No recent weight changes  4. Recurrent major depressive disorder, in full remission [facility])  She is on celexa and has been or sometime. Seems to be working well for her. Depression screen [facility] 2/9 05/01/2017 11/02/2016 06/30/2016  Decreased Interest 0 0 0  Down, Depressed, Hopeless  0 0 0  PHQ - 2 Score 0 0 0     5. GAD (generalized anxiety disorder)  She is the supervisor at work and stays stressed. She takes ativan on occasion.  6. Mixed hyperlipidemia  She has not been watching diet as of late  7. Primary insomnia  Sleeps well most nights and will take 1/ ativan if having trouble sleeping    New complaints: None today  Social history: She works at [employer]  * she had elevated blood sugar in 02/2016 and hgba1c was 6.1%- so will reheck today.  Review of Systems  Constitutional: Negative for activity change and appetite change.  HENT: Negative.   Eyes: Negative for pain.  Respiratory: Positive for cough (occasional). Negative for shortness of breath.   Cardiovascular: Negative for chest pain, palpitations and leg swelling.  Gastrointestinal: Negative for abdominal pain.  Endocrine: Negative for polydipsia.  Genitourinary: Negative.   Skin: Negative for rash.  Neurological: Negative for dizziness, weakness and headaches.  Hematological: Does not bruise/bleed easily.  Psychiatric/Behavioral: Negative.   All other systems reviewed and are negative.       Objective:   Physical Exam  Constitutional: She is oriented to person, place, and time. She appears well-developed and well-nourished.  HENT:  Nose: Nose normal.  Mouth/Throat: Oropharynx is clear and moist.  Eyes: EOM are normal.  Neck: Trachea normal, normal range of motion and full passive range of motion  without pain. Neck supple. No JVD present. Carotid bruit is not present. No thyromegaly present.  Cardiovascular: Normal rate, regular rhythm, normal heart sounds and intact distal pulses. Exam reveals no gallop and no friction rub.  No murmur heard. Pulmonary/Chest: Effort normal and breath sounds normal.  Abdominal: Soft. Bowel sounds are normal. She exhibits no distension and no mass. There is no tenderness.  Musculoskeletal: Normal range of motion.  Lymphadenopathy:    She has no  cervical adenopathy.  Neurological: She is alert and oriented to person, place, and time. She has normal reflexes.  Skin: Skin is warm and dry.  Psychiatric: She has a normal mood and affect. Her behavior is normal. Judgment and thought content normal.    BP 118/74   Pulse 72   Temp (!) 97.5 F (36.4 C) (Oral)   Ht 5' 6"  (1.676 m)   Wt 201 lb (91.2 kg)   BMI 32.44 kg/m   HGba1c 5.5%     Assessment & Plan:  1.Mild intermittent asthma with acute exacerbation - albuterol (PROVENTIL HFA;VENTOLIN HFA) 108 (90 Base) MCG/ACT inhaler; Inhale 2 puffs into the lungs every 6 (six) hours as needed for wheezing.  Dispense: 1 Inhaler; Refill: 1  Albuterol only as needed - mometasone-formoterol (DULERA) 100-5 MCG/ACT AERO; Inhale 1 puff into the lungs once for 1 dose.  Dispense: 1 Inhaler; Refill: 5  2. Gastroesophageal reflux disease without esophagitis Avoid spicy foods Do not eat 2 hours prior to bedtime - pantoprazole (PROTONIX) 40 MG tablet; Take 1 tablet (40 mg total) by mouth 2 (two) times daily.  Dispense: 180 tablet; Refill: 1  3. BMI 33.0-33.9,adult Discussed diet and exercise for person with BMI >25 Will recheck weight in 3-6 months  4. Recurrent major depressive disorder, in full remission ([place]) Stress management - citalopram (CELEXA) 40 MG tablet; TAKE ONE TABLET BY MOUTH ONE TIME DAILy  Dispense: 30 tablet; Refill: 5  5. GAD (generalized anxiety disorder) Stress amnaegment - LORazepam (ATIVAN) 1 MG tablet; Take 1 tablet (1 mg total) by mouth every 8 (eight) hours as needed.  Dispense: 30 tablet; Refill: 2   6. Mixed hyperlipidemia Low fat diet - CMP14+EGFR - Lipid panel - rosuvastatin (CRESTOR) 40 MG tablet; Take 1 tablet (40 mg total) by mouth daily.  Dispense: 90 tablet; Refill: 1  7. Primary insomnia Bedtime routine  8. Elevated blood sugar Watch carbs in diet - Bayer DCA Hb A1c Waived    Labs pending Health maintenance reviewed Diet and exercise  encouraged Continue all meds Follow up  In 6 months   Horace, FNP

## 2017-05-01 NOTE — Addendum Note (Signed)
Addended by: Antonietta Barcelona D on: 05/01/2017 09:27 AM   Modules accepted: Orders

## 2017-05-02 LAB — LIPID PANEL
CHOL/HDL RATIO: 3.9 ratio (ref 0.0–4.4)
Cholesterol, Total: 237 mg/dL — ABNORMAL HIGH (ref 100–199)
HDL: 61 mg/dL (ref >39)
LDL CALC: 155 mg/dL — AB (ref 0–99)
Triglycerides: 106 mg/dL (ref 0–149)
VLDL CHOLESTEROL CAL: 21 mg/dL (ref 5–40)

## 2017-05-02 LAB — CMP14+EGFR
ALT: 17 IU/L (ref 0–32)
AST: 17 IU/L (ref 0–40)
Albumin/Globulin Ratio: 2 (ref 1.2–2.2)
Albumin: 4.5 g/dL (ref 3.5–5.5)
Alkaline Phosphatase: 66 IU/L (ref 39–117)
BUN/Creatinine Ratio: 20 (ref 9–23)
BUN: 19 mg/dL (ref 6–24)
Bilirubin Total: 0.3 mg/dL (ref 0.0–1.2)
CALCIUM: 9.6 mg/dL (ref 8.7–10.2)
CO2: 23 mmol/L (ref 20–29)
CREATININE: 0.94 mg/dL (ref 0.57–1.00)
Chloride: 102 mmol/L (ref 96–106)
GFR calc Af Amer: 77 mL/min/{1.73_m2} (ref >59)
GFR, EST NON AFRICAN AMERICAN: 67 mL/min/{1.73_m2} (ref >59)
GLOBULIN, TOTAL: 2.3 g/dL (ref 1.5–4.5)
Glucose: 108 mg/dL — ABNORMAL HIGH (ref 65–99)
Potassium: 4.4 mmol/L (ref 3.5–5.2)
SODIUM: 141 mmol/L (ref 134–144)
Total Protein: 6.8 g/dL (ref 6.0–8.5)

## 2017-05-06 ENCOUNTER — Encounter: Payer: Self-pay | Admitting: Family Medicine

## 2017-05-06 ENCOUNTER — Telehealth: Payer: Self-pay | Admitting: Family Medicine

## 2017-05-06 MED ORDER — OSELTAMIVIR PHOSPHATE 75 MG PO CAPS: 75.0000 mg | ORAL_CAPSULE | Freq: Two times a day (BID) | ORAL | 0 refills | Status: AC

## 2017-05-06 MED ORDER — BENZONATATE 200 MG PO CAPS: 200.0000 mg | ORAL_CAPSULE | Freq: Two times a day (BID) | ORAL | 0 refills | Status: DC | PRN

## 2017-05-06 NOTE — Telephone Encounter (Signed)
**  [place] After Hours/ Emergency Line Call**  Patient: Crystal Wong.  PCP: Chevis Pretty, FNP  Patient calls to report that she developed sudden onset fever, chills, myalgia, cough, congestion yesterday evening.  She notes that she was exposed to several people at work with influenza.  She is calling to see if there is anything that can be prescribed to help her symptoms.  Denying shortness of breath, wheeze, nausea, vomiting, diarrhea.  Given window of onset, will prescribe Tamiflu 75mg  PO BID x5d and Tessalon Perles 200mg  PO BID PRN cough.  Red flags discussed.  Will forward to PCP.  Ashly M. Lajuana Ripple, DO

## 2017-05-07 ENCOUNTER — Telehealth: Payer: Self-pay | Admitting: Nurse Practitioner

## 2017-05-07 NOTE — Telephone Encounter (Signed)
Agree.  She was not seen for the possible flu.  If she is having significant symptoms, she should be seen to rule out other etiologies.  I will forward to Crystal Wong, her PCP, so that she is aware.

## 2017-05-07 NOTE — Telephone Encounter (Signed)
See below, fyi.  Patient complains of cough, is taking Tessalon Perles, which does help but she is coughing up yellow phlegm.  She wanted to know if she needed an antibiotic.  She does not report any wheezing or shortness of breath.  I explained to patient that she would need to be evaluated by you before we could prescribe an antibiotic.  Patient said she will wait to see if she gets better.  If not, she will contact us to make an appointment.

## 2017-05-09 ENCOUNTER — Other Ambulatory Visit: Payer: Self-pay | Admitting: Nurse Practitioner

## 2017-05-09 NOTE — Telephone Encounter (Signed)
Qvar is not on pt's med list Please review and advise

## 2017-05-10 ENCOUNTER — Other Ambulatory Visit: Payer: Self-pay | Admitting: Nurse Practitioner

## 2017-05-10 MED ORDER — BECLOMETHASONE DIPROP HFA 80 MCG/ACT IN AERB: 2.0000 | INHALATION_SPRAY | Freq: Two times a day (BID) | RESPIRATORY_TRACT | 3 refills | Status: DC

## 2017-05-10 NOTE — Telephone Encounter (Signed)
qvar not nlist because was on dulera an insurance wuld not pay for it. Sent in rx for q var

## 2017-05-10 NOTE — Telephone Encounter (Signed)
Patient aware that Qvar inhaler has been sent to pharmacy

## 2017-05-11 ENCOUNTER — Telehealth: Payer: Self-pay | Admitting: Nurse Practitioner

## 2017-05-11 NOTE — Telephone Encounter (Signed)
Letter ready for pick up

## 2017-05-11 NOTE — Telephone Encounter (Signed)
Ok for note 

## 2017-05-11 NOTE — Telephone Encounter (Signed)
Patient is requesting note for work. She was out Monday through Thursday with the Flu. Patient requesting note to go back today

## 2017-05-15 ENCOUNTER — Encounter: Payer: Self-pay | Admitting: Family Medicine

## 2017-05-15 ENCOUNTER — Ambulatory Visit: Payer: 59 | Admitting: Family Medicine

## 2017-05-15 VITALS — BP 132/78 | HR 92 | Temp 97.6°F | Ht 66.0 in | Wt 198.4 lb

## 2017-05-15 DIAGNOSIS — J45909 Unspecified asthma, uncomplicated: Secondary | ICD-10-CM | POA: Diagnosis not present

## 2017-05-15 DIAGNOSIS — J4541 Moderate persistent asthma with (acute) exacerbation: Secondary | ICD-10-CM | POA: Diagnosis not present

## 2017-05-15 MED ORDER — FLUCONAZOLE 150 MG PO TABS: ORAL_TABLET | ORAL | 0 refills | Status: DC

## 2017-05-15 MED ORDER — AZITHROMYCIN 250 MG PO TABS: ORAL_TABLET | ORAL | 0 refills | Status: DC

## 2017-05-15 MED ORDER — PREDNISONE 20 MG PO TABS: 40.0000 mg | ORAL_TABLET | Freq: Every day | ORAL | 0 refills | Status: DC

## 2017-05-15 NOTE — Patient Instructions (Signed)
Great to see you!   

## 2017-05-15 NOTE — Progress Notes (Signed)
   HPI  Patient presents today with cough.  Patient has a telephone call documented on 3/3, 9 days ago with flulike illness.  She states that flu is going around her office and she is very confident she had the flu.  She finished a course of Tamiflu and did begin to improve.  Over the last several days she has developed worsening cough and states that she "feels like she is sinking".  She is tolerating food and fluids like usual. She has cough productive of clear sputum. She complains of significant chest congestion. She does have cold sweats.   PMH: Smoking status noted ROS: Per HPI  Objective: BP 132/78   Pulse 92   Temp 97.6 F (36.4 C) (Oral)   Ht 5\' 6"  (1.676 m)   Wt 198 lb 6.4 oz (90 kg)   BMI 32.02 kg/m  Gen: NAD, alert, cooperative with exam HEENT: NCAT, TMs normal bilaterally, no tenderness to palpation of bilateral maxillary or frontal sinuses CV: RRR, good S1/S2, no murmur Resp: CTABL, no wheezes, non-labored Ext: No edema, warm Neuro: Alert and oriented, No gross deficits  Assessment and plan:  #Asthma with asthma exacerbation Prednisone plus azithromycin, I am concerned of the patient's having a second sickness with post viral syndrome that improved and now has worsened over the last few days with significant malaise. Return to clinic with any concerns, no red flags currently Lung exam is reassuring considering asthma   Meds ordered this encounter  Medications  . predniSONE (DELTASONE) 20 MG tablet    Sig: Take 2 tablets (40 mg total) by mouth daily with breakfast.    Dispense:  10 tablet    Refill:  0  . azithromycin (ZITHROMAX) 250 MG tablet    Sig: Take 2 tablets on day 1 and 1 tablet daily after that    Dispense:  6 tablet    Refill:  0    Ok to take with celexa  . fluconazole (DIFLUCAN) 150 MG tablet    Sig: Take one pill and repeat in 3 days    Dispense:  2 tablet    Refill:  [place], MD Tristan Schroeder [facility] 05/15/2017, 9:53 AM

## 2017-05-17 NOTE — Telephone Encounter (Signed)
Patient seen since phone call.  This encounter will now be closed 

## 2017-05-31 ENCOUNTER — Ambulatory Visit (INDEPENDENT_AMBULATORY_CARE_PROVIDER_SITE_OTHER): Payer: 59

## 2017-05-31 ENCOUNTER — Encounter: Payer: Self-pay | Admitting: Nurse Practitioner

## 2017-05-31 ENCOUNTER — Ambulatory Visit: Payer: 59

## 2017-05-31 ENCOUNTER — Ambulatory Visit: Payer: 59 | Admitting: Nurse Practitioner

## 2017-05-31 VITALS — BP 133/71 | HR 81 | Temp 97.0°F | Ht 66.0 in | Wt 203.0 lb

## 2017-05-31 DIAGNOSIS — M79672 Pain in left foot: Secondary | ICD-10-CM

## 2017-05-31 DIAGNOSIS — M79601 Pain in right arm: Secondary | ICD-10-CM

## 2017-05-31 DIAGNOSIS — M79671 Pain in right foot: Secondary | ICD-10-CM | POA: Diagnosis not present

## 2017-05-31 MED ORDER — PREDNISONE 10 MG (21) PO TBPK: ORAL_TABLET | ORAL | 0 refills | Status: DC

## 2017-05-31 NOTE — Progress Notes (Signed)
   Subjective:    Patient ID: Crystal Wong, female    DOB: February 09, 1956, 62 y.o.   MRN: [id number]  HPI patient in today c/o: -bil foot pain- mainly the balls of hr feet on both sides. Rates pain 6/10 currently.  Aleve helps the pain. Standing makes it worse. 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 -right upper arm pain- she bruised her right arm in right anticubital area and pain radiates upward. Is just sore to touch.    Review of Systems  Constitutional: Negative for activity change and appetite change.  HENT: Negative.   Eyes: Negative for pain.  Respiratory: Negative for shortness of breath.   Cardiovascular: Negative for chest pain, palpitations and leg swelling.  Gastrointestinal: Negative for abdominal pain.  Endocrine: Negative for polydipsia.  Genitourinary: Negative.   Musculoskeletal:       Bil foot pain   Skin: Negative for rash.  Neurological: Negative for dizziness, weakness and headaches.  Hematological: Does not bruise/bleed easily.  Psychiatric/Behavioral: Negative.   All other systems reviewed and are negative.      Objective:   Physical Exam  Constitutional: She is oriented to person, place, and time. She appears well-developed and well-nourished. She appears distressed (mild).  Cardiovascular: Normal rate and regular rhythm.  Pulmonary/Chest: Effort normal and breath sounds normal.  Musculoskeletal:  Balls of both feet are sore to touch- remaining foot is fine No erythema or edema  Neurological: She is alert and oriented to person, place, and time.  Skin: Skin is warm and dry.  Psychiatric: She has a normal mood and affect. Her behavior is normal. Judgment and thought content normal.   BP 133/71   Pulse 81   Temp (!) 97 F (36.1 C) (Oral)   Ht 5\' 6"  (1.676 m)   Wt 203 lb (92.1 kg)   BMI 32.77 kg/m    bil foot exam- sesamoid bone ball of both feet-Preliminary reading by Ronnald Collum, FNP  [facility]      Assessment & Plan:   1. Bilateral  foot pain   2. Right arm pain    Meds ordered this encounter  Medications  . predniSONE (STERAPRED UNI-PAK 21 TAB) 10 MG (21) TBPK tablet    Sig: As directed x 6 days    Dispense:  21 tablet    Refill:  0    Order Specific Question:   Supervising Provider    Answer:   Evette Doffing, CAROL L [[id number]]   Soak feet in epsom salt  [name] Done, FNP

## 2017-05-31 NOTE — Patient Instructions (Signed)
Foot Pain Many things can cause foot pain. Some common causes are:  An injury.  A sprain.  Arthritis.  Blisters.  Bunions.  Follow these instructions at home: Pay attention to any changes in your symptoms. Take these actions to help with your discomfort:  If directed, put ice on the affected area: ? Put ice in a plastic bag. ? Place a towel between your skin and the bag. ? Leave the ice on for 15-20 minutes, 3?4 times a day for 2 days.  Take over-the-counter and prescription medicines only as told by your health care provider.  Wear comfortable, supportive shoes that fit you well. Do not wear high heels.  Do not stand or walk for long periods of time.  Do not lift a lot of weight. This can put added pressure on your feet.  Do stretches to relieve foot pain and stiffness as told by your health care provider.  Rub your foot gently.  Keep your feet clean and dry.  Contact a health care provider if:  Your pain does not get better after a few days of self-care.  Your pain gets worse.  You cannot stand on your foot. Get help right away if:  Your foot is numb or tingling.  Your foot or toes are swollen.  Your foot or toes turn white or blue.  You have warmth and redness along your foot. This information is not intended to replace advice given to you by your health care provider. Make sure you discuss any questions you have with your health care provider. Document Released: 03/19/2015 Document Revised: 07/29/2015 Document Reviewed: 03/18/2014 Elsevier Interactive Patient Education  2018 [employer] Inc.  

## 2017-06-04 ENCOUNTER — Encounter: Payer: Self-pay | Admitting: Pediatrics

## 2017-06-18 ENCOUNTER — Encounter: Payer: Self-pay | Admitting: Family

## 2017-06-18 ENCOUNTER — Ambulatory Visit (INDEPENDENT_AMBULATORY_CARE_PROVIDER_SITE_OTHER): Payer: 59 | Admitting: Family

## 2017-06-18 VITALS — BP 133/77 | HR 91 | Temp 99.3°F | Ht 66.0 in | Wt 202.2 lb

## 2017-06-18 DIAGNOSIS — J4541 Moderate persistent asthma with (acute) exacerbation: Secondary | ICD-10-CM

## 2017-06-18 DIAGNOSIS — J189 Pneumonia, unspecified organism: Secondary | ICD-10-CM

## 2017-06-18 MED ORDER — PREDNISONE 10 MG (21) PO TBPK: ORAL_TABLET | ORAL | 0 refills | Status: DC

## 2017-06-18 MED ORDER — AZITHROMYCIN 250 MG PO TABS: ORAL_TABLET | ORAL | 0 refills | Status: DC

## 2017-06-18 NOTE — Progress Notes (Signed)
Subjective:    Patient ID: Crystal Wong, female    DOB: August 16, 1955, 62 y.o.   MRN: [id number]  Otalgia   Associated symptoms include coughing, headaches and rhinorrhea. Pertinent negatives include no sore throat.  Cough  This is a new problem. The current episode started in the past 7 days. The problem has been gradually worsening. The problem occurs every few minutes. The cough is productive of sputum. Associated symptoms include ear congestion, ear pain, a fever, headaches, myalgias, nasal congestion, postnasal drip, rhinorrhea, shortness of breath and wheezing. Pertinent negatives include no chills or sore throat. The symptoms are aggravated by lying down. She has tried OTC cough suppressant, rest and ipratropium inhaler for the symptoms. The treatment provided mild relief. Her past medical history is significant for asthma.      Review of Systems  Constitutional: Positive for fever. Negative for chills.  HENT: Positive for ear pain, postnasal drip and rhinorrhea. Negative for sore throat.   Respiratory: Positive for cough, shortness of breath and wheezing.   Musculoskeletal: Positive for myalgias.  Neurological: Positive for headaches.  All other systems reviewed and are negative.      Objective:   Physical Exam  Constitutional: She is oriented to person, place, and time. She appears well-developed and well-nourished. No distress.  HENT:  Head: Normocephalic and atraumatic.  Right Ear: External ear normal.  Left Ear: External ear normal.  Nose: Mucosal edema and rhinorrhea present.  Mouth/Throat: Oropharynx is clear and moist.  Sweating   Eyes: Pupils are equal, round, and reactive to light.  Neck: Normal range of motion. Neck supple. No thyromegaly present.  Cardiovascular: Normal rate, regular rhythm, normal heart sounds and intact distal pulses.  No murmur heard. Pulmonary/Chest: Effort normal. No respiratory distress. She has no wheezes. She has rales.  Constant  tight, dry nonproductive cough   Abdominal: Soft. Bowel sounds are normal. She exhibits no distension. There is no tenderness.  Musculoskeletal: Normal range of motion. She exhibits no edema or tenderness.  Neurological: She is alert and oriented to person, place, and time. A cranial nerve deficit is present.  Skin: Skin is warm and dry.  Psychiatric: She has a normal mood and affect. Her behavior is normal. Judgment and thought content normal.  Vitals reviewed.    BP 133/77   Pulse 91   Temp 99.3 F (37.4 C) (Oral)   Ht 5\' 6"  (1.676 m)   Wt 202 lb 3.2 oz (91.7 kg)   BMI 32.64 kg/m     Assessment & Plan:  1. Moderate persistent asthma with acute exacerbation - predniSONE (STERAPRED UNI-PAK 21 TAB) 10 MG (21) TBPK tablet; Use as directed  Dispense: 21 tablet; Refill: 0 - azithromycin (ZITHROMAX) 250 MG tablet; Take 500 mg once, then 250 mg for four days  Dispense: 6 tablet; Refill: 0  2. Pneumonia due to infectious organism, unspecified laterality, unspecified part of lung - Take meds as prescribed - Use a cool mist humidifier  -Use saline nose sprays frequently -Force fluids -For any cough or congestion  Use plain Mucinex- regular strength or max strength is fine -For fever or aces or pains- take tylenol or ibuprofen. -Throat lozenges if help -New toothbrush in 3 days - predniSONE (STERAPRED UNI-PAK 21 TAB) 10 MG (21) TBPK tablet; Use as directed  Dispense: 21 tablet; Refill: 0 - azithromycin (ZITHROMAX) 250 MG tablet; Take 500 mg once, then 250 mg for four days  Dispense: 6 tablet; Refill: 0   Evelina Dun, FNP

## 2017-06-18 NOTE — Patient Instructions (Signed)

## 2017-06-19 ENCOUNTER — Telehealth: Payer: Self-pay | Admitting: Nurse Practitioner

## 2017-06-19 NOTE — Telephone Encounter (Signed)
Reviewed notes from [facility] with pt

## 2017-06-20 ENCOUNTER — Ambulatory Visit (INDEPENDENT_AMBULATORY_CARE_PROVIDER_SITE_OTHER): Payer: 59

## 2017-06-20 ENCOUNTER — Ambulatory Visit: Payer: 59 | Admitting: Pediatrics

## 2017-06-20 ENCOUNTER — Encounter: Payer: Self-pay | Admitting: Pediatrics

## 2017-06-20 VITALS — BP 117/75 | HR 85 | Temp 101.5°F | Resp 16 | Ht 66.0 in | Wt 202.2 lb

## 2017-06-20 DIAGNOSIS — R509 Fever, unspecified: Secondary | ICD-10-CM

## 2017-06-20 DIAGNOSIS — R059 Cough, unspecified: Secondary | ICD-10-CM

## 2017-06-20 DIAGNOSIS — J181 Lobar pneumonia, unspecified organism: Secondary | ICD-10-CM

## 2017-06-20 DIAGNOSIS — J189 Pneumonia, unspecified organism: Secondary | ICD-10-CM

## 2017-06-20 DIAGNOSIS — R05 Cough: Secondary | ICD-10-CM | POA: Diagnosis not present

## 2017-06-20 MED ORDER — LEVOFLOXACIN 500 MG PO TABS
500.0000 mg | ORAL_TABLET | Freq: Every day | ORAL | 0 refills | Status: DC
Start: 2017-06-20 — End: 2017-07-05

## 2017-06-20 NOTE — Progress Notes (Signed)
  Subjective:   Patient ID: Crystal Wong, female    DOB: 1955-03-14, 62 y.o.   MRN: [id number] CC: Pneumonia (Not feeling any better)  HPI: Crystal Wong is a 62 y.o. female presenting for Pneumonia (Not feeling any better)  Started getting sick 4 days ago after being at a work conference.  Started having fevers 3 days ago.  Takes Mucinex and ibuprofen off and on, about once a day.  Bothered most now by the cough and body aches.  Was seen 2 days ago, treated for an asthma exacerbation with prednisone, also treated for pneumonia with azithromycin.  Patient has been taking her inhalers at home, has both Qvar and albuterol.  Says sometimes seems to make things worse.  Relevant past medical, surgical, family and social history reviewed. Allergies and medications reviewed and updated. Social History   Tobacco Use  Smoking Status Never Smoker  Smokeless Tobacco Never Used   ROS: Per HPI   Objective:    BP 117/75   Pulse 85   Temp (!) 101.5 F (38.6 C) (Oral)   Resp 16   Ht 5\' 6"  (1.676 m)   Wt 202 lb 3.2 oz (91.7 kg)   SpO2 94%   BMI 32.64 kg/m   Wt Readings from Last 3 Encounters:  06/20/17 202 lb 3.2 oz (91.7 kg)  06/18/17 202 lb 3.2 oz (91.7 kg)  05/31/17 203 lb (92.1 kg)    Gen: NAD, alert, cooperative with exam, NCAT EYES: EOMI, no conjunctival injection, or no icterus ENT:  TMs pearly gray b/l, OP without erythema LYMPH: no cervical LAD CV: NRRR, normal S1/S2, no murmur, distal pulses 2+ b/l Resp: Moving air fair, crackles left base, prolonged expiratory phase with forced exhalation, comfortable WOB, dry cough Abd: +BS, soft, NTND. no guarding or organomegaly Ext: No edema, warm Neuro: Alert and oriented  Chest x-ray: "IMPRESSION: Mild left basilar subsegmental atelectasis or infiltrate. Followup PA and lateral chest X-ray is recommended in 3-4 weeks following trial of antibiotic therapy to ensure resolution and exclude underlying malignancy."  Assessment & Plan:    Crystal Wong was seen today for pneumonia.  Diagnoses and all orders for this visit:  Cough Continue prednisone, albuterol -     DG Chest 2 View; Future  Fever, unspecified fever cause -     DG Chest 2 View; Future  Community acquired pneumonia of left lower lobe of lung ([place]) Stop azithromycin start below, return to clinic 3-4 weeks for repeat chest x-ray -     levofloxacin (LEVAQUIN) 500 MG tablet; Take 1 tablet (500 mg total) by mouth daily.   Follow up plan: Return in about 1 month (around 07/18/2017). Assunta Found, MD [place]

## 2017-06-21 ENCOUNTER — Other Ambulatory Visit: Payer: Self-pay | Admitting: Family Medicine

## 2017-06-26 ENCOUNTER — Encounter: Payer: Self-pay | Admitting: *Deleted

## 2017-06-26 ENCOUNTER — Telehealth: Payer: Self-pay | Admitting: Nurse Practitioner

## 2017-06-26 NOTE — Telephone Encounter (Signed)
Letter ready for pick up

## 2017-06-26 NOTE — Telephone Encounter (Signed)
Please  write and I will sign. Thanks, WS 

## 2017-06-26 NOTE — Telephone Encounter (Signed)
Returned patients phone call.  Patient was diagnosed with pneumonia on 4/17 and has been out of work since 4/15.  Patient would like to go back to work on Friday 4/26 and will need a work note

## 2017-06-27 ENCOUNTER — Telehealth: Payer: Self-pay | Admitting: Nurse Practitioner

## 2017-06-27 MED ORDER — FLUCONAZOLE 150 MG PO TABS: 150.0000 mg | ORAL_TABLET | ORAL | 0 refills | Status: DC | PRN

## 2017-06-27 NOTE — Telephone Encounter (Signed)
Left message- medication requested has been sent to the pharmacy.

## 2017-06-27 NOTE — Telephone Encounter (Signed)
Diflucan Prescription sent to pharmacy   

## 2017-07-05 ENCOUNTER — Ambulatory Visit: Payer: 59 | Admitting: Pediatrics

## 2017-07-05 ENCOUNTER — Ambulatory Visit (INDEPENDENT_AMBULATORY_CARE_PROVIDER_SITE_OTHER): Payer: 59

## 2017-07-05 ENCOUNTER — Encounter: Payer: Self-pay | Admitting: Pediatrics

## 2017-07-05 VITALS — BP 130/89 | HR 80 | Temp 97.7°F | Resp 18 | Ht 66.0 in | Wt 201.8 lb

## 2017-07-05 DIAGNOSIS — R059 Cough, unspecified: Secondary | ICD-10-CM

## 2017-07-05 DIAGNOSIS — R05 Cough: Secondary | ICD-10-CM | POA: Diagnosis not present

## 2017-07-05 DIAGNOSIS — J189 Pneumonia, unspecified organism: Secondary | ICD-10-CM | POA: Diagnosis not present

## 2017-07-05 MED ORDER — GUAIFENESIN-CODEINE 100-10 MG/5ML PO SOLN: 5.0000 mL | Freq: Three times a day (TID) | ORAL | 0 refills | Status: DC | PRN

## 2017-07-05 NOTE — Progress Notes (Signed)
  Subjective:   Patient ID: [name], female    DOB: [birth year], 62 y.o.   MRN: [id number] CC: Cough and Recheck pneumonia  HPI: DIONDRA PINES is a 62 y.o. female   Seen 3 weeks ago and treated with levofloxacin for pneumonia.  X-ray with mild left basilar atelectasis versus infiltrate.  Recommended repeating chest x-ray in 3 to 4 weeks.  Feels much better than she did at last visit.  Continues to have some coughing.  Wakes her up at night.  She does not have a sense of smell, works in [employer].  Books exposed to smoke or other allergens often bother her and make the coughing worse but she works with her staff to try to stay away from them which helps.  No fevers.  Appetite is been fine.  Never smoker.  Was exposed to tobacco smoke growing up.  Relevant past medical, surgical, family and social history reviewed. Allergies and medications reviewed and updated. Social History   Tobacco Use  Smoking Status Never Smoker  Smokeless Tobacco Never Used   ROS: Per HPI   Objective:    BP 130/89   Pulse 80   Temp 97.7 F (36.5 C) (Oral)   Resp 18   Ht 5\' 6"  (1.676 m)   Wt 201 lb 12.8 oz (91.5 kg)   SpO2 98%   BMI 32.57 kg/m   Wt Readings from Last 3 Encounters:  07/05/17 201 lb 12.8 oz (91.5 kg)  06/20/17 202 lb 3.2 oz (91.7 kg)  06/18/17 202 lb 3.2 oz (91.7 kg)   Gen: NAD, alert, cooperative with exam, NCAT, dry cough EYES: EOMI, no conjunctival injection, or no icterus ENT:  TMs pearly gray b/l, OP without erythema LYMPH: no cervical LAD CV: NRRR, normal S1/S2, no murmur, distal pulses 2+ b/l Resp: CTABL, no wheezes, normal WOB Abd: +BS, soft, NTND. no guarding or organomegaly Ext: No edema, warm Neuro: Alert and oriented, strength equal b/l UE and LE, coordination grossly normal MSK: normal muscle bulk  Assessment & Plan:  Daphine was seen today for cough and recheck pneumonia.  Diagnoses and all orders for this visit:  Cough Fever, appetite improved.  Cough  continues to bother her, keeping her awake.  Repeat chest x-ray with resolved pneumonia.  Discussed symptom care, continue fluids, keep lozenges with her.  Okay to take 1 to 2 teaspoons of codeine, not with any other medicines to cause sleepiness, do not take and drive given risk of drowsiness, at night as needed. -     guaiFENesin-codeine 100-10 MG/5ML syrup; Take 5-10 mLs by mouth 3 (three) times daily as needed for cough. -     DG Chest 2 View; Future  Pneumonia due to infectious organism, unspecified laterality, unspecified part of lung   Follow up plan: As needed. Assunta Found, MD [place]

## 2017-08-09 ENCOUNTER — Other Ambulatory Visit: Payer: Self-pay | Admitting: Nurse Practitioner

## 2017-08-20 ENCOUNTER — Encounter: Payer: Self-pay | Admitting: Family Medicine

## 2017-08-20 ENCOUNTER — Ambulatory Visit: Payer: 59 | Admitting: Family Medicine

## 2017-08-20 VITALS — BP 128/70 | HR 77 | Temp 97.1°F | Ht 66.0 in | Wt 208.5 lb

## 2017-08-20 DIAGNOSIS — R600 Localized edema: Secondary | ICD-10-CM

## 2017-08-20 DIAGNOSIS — G5603 Carpal tunnel syndrome, bilateral upper limbs: Secondary | ICD-10-CM

## 2017-08-20 NOTE — Progress Notes (Signed)
Subjective:  Patient ID: Crystal Wong, female    DOB: [birth year]  Age: 62 y.o. MRN: [id number]  CC: Foot Pain (pt here today c/o right foot pain and swelling )   HPI Crystal Wong presents for onset of right foot pain last week while she was at the beach.  There is no pain or injury noted.  The swelling just seem to slowly increase through the week.  She has had swelling on the left in the past due to an old injury at the ankle but that has not been a problem recently and the right leg is actually swollen more than the left.  Patient is additionally concerned that she has carpal tunnel syndrome and wonders if the swelling could possibly be related.  Depression screen [facility] 2/9 07/05/2017 06/20/2017 06/18/2017  Decreased Interest 0 0 0  Down, Depressed, Hopeless 0 0 0  PHQ - 2 Score 0 0 0    History Aleshia has a past medical history of Anxiety, GERD (gastroesophageal reflux disease), Hyperlipidemia, and Insomnia.   She has no past surgical history on file.   Her family history includes Cancer in her father.She reports that she has never smoked. She has never used smokeless tobacco. She reports that she does not drink alcohol or use drugs.    ROS Review of Systems  Constitutional: Negative.   HENT: Negative.   Eyes: Negative for visual disturbance.  Respiratory: Negative for shortness of breath.   Cardiovascular: Positive for leg swelling. Negative for chest pain.  Gastrointestinal: Negative for abdominal pain.  Musculoskeletal: Negative for arthralgias.    Objective:  BP 128/70   Pulse 77   Temp (!) 97.1 F (36.2 C) (Oral)   Ht 5' 6"  (1.676 m)   Wt 208 lb 8 oz (94.6 kg)   BMI 33.65 kg/m   BP Readings from Last 3 Encounters:  08/20/17 128/70  07/05/17 130/89  06/20/17 117/75    Wt Readings from Last 3 Encounters:  08/20/17 208 lb 8 oz (94.6 kg)  07/05/17 201 lb 12.8 oz (91.5 kg)  06/20/17 202 lb 3.2 oz (91.7 kg)     Physical Exam  Constitutional: She is oriented to  person, place, and time. She appears well-developed and well-nourished. No distress.  Cardiovascular: Normal rate and regular rhythm.  Pulmonary/Chest: Breath sounds normal.  Musculoskeletal: Normal range of motion. She exhibits edema (Edema is noted to be 1+ at the right lower extremity, trace on the left.). She exhibits no tenderness or deformity.  Neurological: She is alert and oriented to person, place, and time.  Skin: Skin is warm and dry.  Psychiatric: She has a normal mood and affect.      Assessment & Plan:   Tessica was seen today for foot pain.  Diagnoses and all orders for this visit:  Edema extremities -     CMP14+EGFR -     VAS [place] LOWER EXTREMITY VENOUS (DVT); Future  Bilateral carpal tunnel syndrome -     Ambulatory referral to Orthopedic Surgery       I have discontinued Coretta C. Hedgepath's guaiFENesin-codeine. I am also having her maintain her fluticasone, LORazepam, pantoprazole, rosuvastatin, albuterol, citalopram, beclomethasone, and loratadine-pseudoephedrine.  Allergies as of 08/20/2017      Reactions   Sulfur    Other reaction(s): Angioedema (ALLERGY/intolerance)      Medication List        Accurate as of 08/20/17  2:17 PM. Always use your most recent med list.  albuterol 108 (90 Base) MCG/ACT inhaler Commonly known as:  PROVENTIL HFA;VENTOLIN HFA Inhale 2 puffs into the lungs every 6 (six) hours as needed for wheezing.   beclomethasone 80 MCG/ACT inhaler Commonly known as:  QVAR REDIHALER Inhale 2 puffs into the lungs 2 (two) times daily.   citalopram 40 MG tablet Commonly known as:  CELEXA TAKE ONE TABLET BY MOUTH ONE TIME DAILy   fluticasone 50 MCG/ACT nasal spray Commonly known as:  FLONASE Place 2 sprays into both nostrils daily.   loratadine-pseudoephedrine 10-240 MG 24 hr tablet Commonly known as:  LORATADINE-D 24HR TAKE 1 TABLET BY MOUTH ONCE DAILY   LORazepam 1 MG tablet Commonly known as:  ATIVAN Take 1 tablet (1 mg  total) by mouth every 8 (eight) hours as needed.   pantoprazole 40 MG tablet Commonly known as:  PROTONIX Take 1 tablet (40 mg total) by mouth 2 (two) times daily.   rosuvastatin 40 MG tablet Commonly known as:  CRESTOR Take 1 tablet (40 mg total) by mouth daily.      Will rule out DVT.  Also check CMP for renal function.  Exam is negative for signs of congestive failure other than the mild edema.  Additionally due to the concern about carpal tunnel syndrome go ahead and refer to orthopedics at her request.  Follow-up: No follow-ups on file.  Claretta Fraise, M.D.

## 2017-08-20 NOTE — Patient Instructions (Signed)
Avoid Salt!!!

## 2017-08-21 LAB — CMP14+EGFR
ALBUMIN: 4.4 g/dL (ref 3.5–5.5)
ALK PHOS: 69 IU/L (ref 39–117)
ALT: 18 IU/L (ref 0–32)
AST: 17 IU/L (ref 0–40)
Albumin/Globulin Ratio: 1.8 (ref 1.2–2.2)
BUN / CREAT RATIO: 17 (ref 9–23)
BUN: 13 mg/dL (ref 6–24)
Bilirubin Total: 0.5 mg/dL (ref 0.0–1.2)
CALCIUM: 9.8 mg/dL (ref 8.7–10.2)
CO2: 25 mmol/L (ref 20–29)
CREATININE: 0.77 mg/dL (ref 0.57–1.00)
Chloride: 104 mmol/L (ref 96–106)
GFR, EST AFRICAN AMERICAN: 98 mL/min/{1.73_m2} (ref >59)
GFR, EST NON AFRICAN AMERICAN: 85 mL/min/{1.73_m2} (ref >59)
GLOBULIN, TOTAL: 2.5 g/dL (ref 1.5–4.5)
Glucose: 94 mg/dL (ref 65–99)
Potassium: 4.6 mmol/L (ref 3.5–5.2)
SODIUM: 143 mmol/L (ref 134–144)
TOTAL PROTEIN: 6.9 g/dL (ref 6.0–8.5)

## 2017-08-28 ENCOUNTER — Other Ambulatory Visit: Payer: Self-pay | Admitting: Nurse Practitioner

## 2017-08-28 DIAGNOSIS — F411 Generalized anxiety disorder: Secondary | ICD-10-CM

## 2017-11-18 ENCOUNTER — Other Ambulatory Visit: Payer: Self-pay | Admitting: Nurse Practitioner

## 2017-11-18 DIAGNOSIS — F3342 Major depressive disorder, recurrent, in full remission: Secondary | ICD-10-CM

## 2017-12-03 ENCOUNTER — Other Ambulatory Visit: Payer: Self-pay | Admitting: Nurse Practitioner

## 2017-12-03 DIAGNOSIS — F411 Generalized anxiety disorder: Secondary | ICD-10-CM

## 2017-12-11 ENCOUNTER — Encounter: Payer: Self-pay | Admitting: Nurse Practitioner

## 2017-12-11 ENCOUNTER — Ambulatory Visit: Payer: 59 | Admitting: Nurse Practitioner

## 2017-12-11 VITALS — BP 120/77 | HR 75 | Temp 97.6°F | Ht 66.0 in | Wt 200.0 lb

## 2017-12-11 DIAGNOSIS — F411 Generalized anxiety disorder: Secondary | ICD-10-CM

## 2017-12-11 DIAGNOSIS — Z6833 Body mass index (BMI) 33.0-33.9, adult: Secondary | ICD-10-CM | POA: Diagnosis not present

## 2017-12-11 DIAGNOSIS — Z1211 Encounter for screening for malignant neoplasm of colon: Secondary | ICD-10-CM

## 2017-12-11 DIAGNOSIS — K219 Gastro-esophageal reflux disease without esophagitis: Secondary | ICD-10-CM | POA: Diagnosis not present

## 2017-12-11 DIAGNOSIS — Z1212 Encounter for screening for malignant neoplasm of rectum: Secondary | ICD-10-CM

## 2017-12-11 DIAGNOSIS — F5101 Primary insomnia: Secondary | ICD-10-CM | POA: Diagnosis not present

## 2017-12-11 DIAGNOSIS — Z23 Encounter for immunization: Secondary | ICD-10-CM | POA: Diagnosis not present

## 2017-12-11 DIAGNOSIS — E782 Mixed hyperlipidemia: Secondary | ICD-10-CM | POA: Diagnosis not present

## 2017-12-11 DIAGNOSIS — F3342 Major depressive disorder, recurrent, in full remission: Secondary | ICD-10-CM

## 2017-12-11 LAB — CMP14+EGFR
ALT: 15 IU/L (ref 0–32)
AST: 15 IU/L (ref 0–40)
Albumin/Globulin Ratio: 2 (ref 1.2–2.2)
Albumin: 4.4 g/dL (ref 3.6–4.8)
Alkaline Phosphatase: 77 IU/L (ref 39–117)
BUN/Creatinine Ratio: 20 (ref 12–28)
BUN: 19 mg/dL (ref 8–27)
Bilirubin Total: 0.4 mg/dL (ref 0.0–1.2)
CALCIUM: 9.8 mg/dL (ref 8.7–10.3)
CO2: 26 mmol/L (ref 20–29)
Chloride: 100 mmol/L (ref 96–106)
Creatinine, Ser: 0.94 mg/dL (ref 0.57–1.00)
GFR, EST AFRICAN AMERICAN: 76 mL/min/{1.73_m2} (ref >59)
GFR, EST NON AFRICAN AMERICAN: 66 mL/min/{1.73_m2} (ref >59)
Globulin, Total: 2.2 g/dL (ref 1.5–4.5)
Glucose: 112 mg/dL — ABNORMAL HIGH (ref 65–99)
Potassium: 4.5 mmol/L (ref 3.5–5.2)
Sodium: 141 mmol/L (ref 134–144)
Total Protein: 6.6 g/dL (ref 6.0–8.5)

## 2017-12-11 LAB — LIPID PANEL
Chol/HDL Ratio: 4.6 ratio — ABNORMAL HIGH (ref 0.0–4.4)
Cholesterol, Total: 251 mg/dL — ABNORMAL HIGH (ref 100–199)
HDL: 55 mg/dL (ref >39)
LDL Calculated: 171 mg/dL — ABNORMAL HIGH (ref 0–99)
Triglycerides: 125 mg/dL (ref 0–149)
VLDL CHOLESTEROL CAL: 25 mg/dL (ref 5–40)

## 2017-12-11 MED ORDER — LORAZEPAM 1 MG PO TABS: 1.0000 mg | ORAL_TABLET | Freq: Three times a day (TID) | ORAL | 2 refills | Status: DC | PRN

## 2017-12-11 MED ORDER — PANTOPRAZOLE SODIUM 40 MG PO TBEC: 40.0000 mg | DELAYED_RELEASE_TABLET | Freq: Two times a day (BID) | ORAL | 1 refills | Status: DC

## 2017-12-11 MED ORDER — CITALOPRAM HYDROBROMIDE 40 MG PO TABS: ORAL_TABLET | ORAL | 0 refills | Status: DC

## 2017-12-11 MED ORDER — ROSUVASTATIN CALCIUM 40 MG PO TABS: 40.0000 mg | ORAL_TABLET | Freq: Every day | ORAL | 1 refills | Status: DC

## 2017-12-11 NOTE — Patient Instructions (Signed)
Stress and Stress Management Stress is a normal reaction to life events. It is what you feel when life demands more than you are used to or more than you can handle. Some stress can be useful. For example, the stress reaction can help you catch the last bus of the day, study for a test, or meet a deadline at work. But stress that occurs too often or for too long can cause problems. It can affect your emotional health and interfere with relationships and normal daily activities. Too much stress can weaken your immune system and increase your risk for physical illness. If you already have a medical problem, stress can make it worse. What are the causes? All sorts of life events may cause stress. An event that causes stress for one person may not be stressful for another person. Major life events commonly cause stress. These may be positive or negative. Examples include losing your job, moving into a new home, getting married, having a baby, or losing a loved one. Less obvious life events may also cause stress, especially if they occur day after day or in combination. Examples include working long hours, driving in traffic, caring for children, being in debt, or being in a difficult relationship. What are the signs or symptoms? Stress may cause emotional symptoms including, the following:  Anxiety. This is feeling worried, afraid, on edge, overwhelmed, or out of control.  Anger. This is feeling irritated or impatient.  Depression. This is feeling sad, down, helpless, or guilty.  Difficulty focusing, remembering, or making decisions.  Stress may cause physical symptoms, including the following:  Aches and pains. These may affect your head, neck, back, stomach, or other areas of your body.  Tight muscles or clenched jaw.  Low energy or trouble sleeping.  Stress may cause unhealthy behaviors, including the following:  Eating to feel better (overeating) or skipping meals.  Sleeping too little,  too much, or both.  Working too much or putting off tasks (procrastination).  Smoking, drinking alcohol, or using drugs to feel better.  How is this diagnosed? Stress is diagnosed through an assessment by your health care provider. Your health care provider will ask questions about your symptoms and any stressful life events.Your health care provider will also ask about your medical history and may order blood tests or other tests. Certain medical conditions and medicine can cause physical symptoms similar to stress. Mental illness can cause emotional symptoms and unhealthy behaviors similar to stress. Your health care provider may refer you to a mental health professional for further evaluation. How is this treated? Stress management is the recommended treatment for stress.The goals of stress management are reducing stressful life events and coping with stress in healthy ways. Techniques for reducing stressful life events include the following:  Stress identification. Self-monitor for stress and identify what causes stress for you. These skills may help you to avoid some stressful events.  Time management. Set your priorities, keep a calendar of events, and learn to say "no." These tools can help you avoid making too many commitments.  Techniques for coping with stress include the following:  Rethinking the problem. Try to think realistically about stressful events rather than ignoring them or overreacting. Try to find the positives in a stressful situation rather than focusing on the negatives.  Exercise. Physical exercise can release both physical and emotional tension. The key is to find a form of exercise you enjoy and do it regularly.  Relaxation techniques. These relax the body and  mind. Examples include yoga, meditation, tai chi, biofeedback, deep breathing, progressive muscle relaxation, listening to music, being out in nature, journaling, and other hobbies. Again, the key is to find  one or more that you enjoy and can do regularly.  Healthy lifestyle. Eat a balanced diet, get plenty of sleep, and do not smoke. Avoid using alcohol or drugs to relax.  Strong support network. Spend time with family, friends, or other people you enjoy being around.Express your feelings and talk things over with someone you trust.  Counseling or talktherapy with a mental health professional may be helpful if you are having difficulty managing stress on your own. Medicine is typically not recommended for the treatment of stress.Talk to your health care provider if you think you need medicine for symptoms of stress. Follow these instructions at home:  Keep all follow-up visits as directed by your health care provider.  Take all medicines as directed by your health care provider. Contact a health care provider if:  Your symptoms get worse or you start having new symptoms.  You feel overwhelmed by your problems and can no longer manage them on your own. Get help right away if:  You feel like hurting yourself or someone else. This information is not intended to replace advice given to you by your health care provider. Make sure you discuss any questions you have with your health care provider. Document Released: 08/16/2000 Document Revised: 07/29/2015 Document Reviewed: 10/15/2012 Elsevier Interactive Patient Education  2017 [employer]  

## 2017-12-11 NOTE — Addendum Note (Signed)
Addended by: Chevis Pretty on: 12/11/2017 08:53 AM   Modules accepted: Orders

## 2017-12-11 NOTE — Addendum Note (Signed)
Addended by: Rolena Infante on: 12/11/2017 09:55 AM   Modules accepted: Orders

## 2017-12-11 NOTE — Progress Notes (Signed)
Subjective:    Patient ID: Crystal Wong, female    DOB: Jan 08, 1956, 62 y.o.   MRN: [id number]   Chief Complaint: medical management of chronic issues  HPI:  1. Gastroesophageal reflux disease without esophagitis  Patient is on protonix daily and works well to keep symptoms under control.  2. BMI 33.0-33.9,adult  weight is down 8lbs  3. Mixed hyperlipidemia  Does not watch diet and does no exercise  4. Primary insomnia  She has had trouble sleeping for years. Uses OTC meds when it get sbad and that will ususally help for a few days  5. Recurrent major depressive disorder, in full remission ([place])  Is on celexa daily.works well to keep her emotions under control  6. GAD (generalized anxiety disorder)  Patient is ativan on prn basis. Doe not take everyday but does take several times a week.    Outpatient Encounter Medications as of 12/11/2017  Medication Sig  . albuterol (PROVENTIL HFA;VENTOLIN HFA) 108 (90 Base) MCG/ACT inhaler Inhale 2 puffs into the lungs every 6 (six) hours as needed for wheezing.  . beclomethasone (QVAR REDIHALER) 80 MCG/ACT inhaler Inhale 2 puffs into the lungs 2 (two) times daily.  . citalopram (CELEXA) 40 MG tablet TAKE 1 TABLET BY MOUTH EVERY DAY, needs to be seen  . fluticasone (FLONASE) 50 MCG/ACT nasal spray Place 2 sprays into both nostrils daily.  Marland Kitchen loratadine-pseudoephedrine (LORATADINE-D 24HR) 10-240 MG 24 hr tablet TAKE 1 TABLET BY MOUTH ONCE DAILY  . LORazepam (ATIVAN) 1 MG tablet TAKE 1 TABLET (1 MG TOTAL) BY MOUTH EVERY 8 (EIGHT) HOURS AS NEEDED.  Marland Kitchen pantoprazole (PROTONIX) 40 MG tablet Take 1 tablet (40 mg total) by mouth 2 (two) times daily.  . rosuvastatin (CRESTOR) 40 MG tablet Take 1 tablet (40 mg total) by mouth daily.      New complaints: None today  Social history: Is the [place] at [employer].    Review of Systems  Constitutional: Negative for activity change and appetite change.  HENT: Negative.   Eyes: Negative  for pain.  Respiratory: Negative for shortness of breath.   Cardiovascular: Negative for chest pain, palpitations and leg swelling.  Gastrointestinal: Negative for abdominal pain.  Endocrine: Negative for polydipsia.  Genitourinary: Negative.   Skin: Negative for rash.  Neurological: Negative for dizziness, weakness and headaches.  Hematological: Does not bruise/bleed easily.  Psychiatric/Behavioral: Negative.   All other systems reviewed and are negative.      Objective:   Physical Exam  Constitutional: She is oriented to person, place, and time. She appears well-developed and well-nourished. No distress.  HENT:  Head: Normocephalic.  Nose: Nose normal.  Mouth/Throat: Oropharynx is clear and moist.  Eyes: Pupils are equal, round, and reactive to light. EOM are normal.  Neck: Normal range of motion. Neck supple. No JVD present. Carotid bruit is not present.  Cardiovascular: Normal rate, regular rhythm, normal heart sounds and intact distal pulses.  Pulmonary/Chest: Effort normal and breath sounds normal. No respiratory distress. She has no wheezes. She has no rales. She exhibits no tenderness.  Abdominal: Soft. Normal appearance, normal aorta and bowel sounds are normal. She exhibits no distension, no abdominal bruit, no pulsatile midline mass and no mass. There is no splenomegaly or hepatomegaly. There is no tenderness.  Musculoskeletal: Normal range of motion. She exhibits no edema.  Lymphadenopathy:    She has no cervical adenopathy.  Neurological: She is alert and oriented to person, place, and time. She has normal reflexes.  Skin: Skin is warm and dry.  Psychiatric: She has a normal mood and affect. Her behavior is normal. Judgment and thought content normal.  Nursing note and vitals reviewed.   BP 120/77   Pulse 75   Temp 97.6 F (36.4 C) (Oral)   Ht 5' 6"  (1.676 m)   Wt 200 lb (90.7 kg)   BMI 32.28 kg/m        Assessment & Plan:  Crystal Wong comes in today  with chief complaint of Medical Management of Chronic Issues   Diagnosis and orders addressed:  1. Gastroesophageal reflux disease without esophagitis Avoid spicy foods Do not eat 2 hours prior to bedtime - pantoprazole (PROTONIX) 40 MG tablet; Take 1 tablet (40 mg total) by mouth 2 (two) times daily.  Dispense: 180 tablet; Refill: 1  2. BMI 33.0-33.9,adult Discussed diet and exercise for person with BMI >25 Will recheck weight in 3-6 months   3. Mixed hyperlipidemia Low fat diet - rosuvastatin (CRESTOR) 40 MG tablet; Take 1 tablet (40 mg total) by mouth daily.  Dispense: 90 tablet; Refill: 1 - CMP14+EGFR - Lipid panel  4. Primary insomnia Bedtime routine  5. Recurrent major depressive disorder, in full remission ([place]) Stress management - citalopram (CELEXA) 40 MG tablet; TAKE 1 TABLET BY MOUTH EVERY DAY, needs to be seen  Dispense: 30 tablet; Refill: 0  6. GAD (generalized anxiety disorder) - LORazepam (ATIVAN) 1 MG tablet; Take 1 tablet (1 mg total) by mouth every 8 (eight) hours as needed.  Dispense: 30 tablet; Refill: 2   Labs pending Health Maintenance reviewed Diet and exercise encouraged  Follow up plan: 3 month   [place], FNP

## 2017-12-15 LAB — COLOGUARD

## 2018-01-04 ENCOUNTER — Encounter: Payer: 59 | Admitting: Nurse Practitioner

## 2018-01-15 ENCOUNTER — Ambulatory Visit (INDEPENDENT_AMBULATORY_CARE_PROVIDER_SITE_OTHER): Payer: 59 | Admitting: Nurse Practitioner

## 2018-01-15 ENCOUNTER — Encounter: Payer: Self-pay | Admitting: Nurse Practitioner

## 2018-01-15 VITALS — BP 121/74 | HR 82 | Temp 98.0°F | Ht 66.0 in | Wt 204.0 lb

## 2018-01-15 DIAGNOSIS — E782 Mixed hyperlipidemia: Secondary | ICD-10-CM

## 2018-01-15 DIAGNOSIS — J4541 Moderate persistent asthma with (acute) exacerbation: Secondary | ICD-10-CM

## 2018-01-15 DIAGNOSIS — Z0000 Encounter for general adult medical examination without abnormal findings: Secondary | ICD-10-CM

## 2018-01-15 DIAGNOSIS — F5101 Primary insomnia: Secondary | ICD-10-CM

## 2018-01-15 DIAGNOSIS — K219 Gastro-esophageal reflux disease without esophagitis: Secondary | ICD-10-CM

## 2018-01-15 DIAGNOSIS — Z6833 Body mass index (BMI) 33.0-33.9, adult: Secondary | ICD-10-CM | POA: Diagnosis not present

## 2018-01-15 DIAGNOSIS — K648 Other hemorrhoids: Secondary | ICD-10-CM

## 2018-01-15 DIAGNOSIS — F3342 Major depressive disorder, recurrent, in full remission: Secondary | ICD-10-CM

## 2018-01-15 DIAGNOSIS — F411 Generalized anxiety disorder: Secondary | ICD-10-CM

## 2018-01-15 MED ORDER — HYDROCORTISONE ACETATE 25 MG RE SUPP: 25.0000 mg | Freq: Two times a day (BID) | RECTAL | 0 refills | Status: DC

## 2018-01-15 MED ORDER — LORAZEPAM 1 MG PO TABS: 1.0000 mg | ORAL_TABLET | Freq: Three times a day (TID) | ORAL | 5 refills | Status: DC | PRN

## 2018-01-15 NOTE — Progress Notes (Signed)
Subjective:    Patient ID: Crystal Wong, female    DOB: June 21, 1955, 62 y.o.   MRN: [id number]   Chief Complaint: medical management of chronic issues  HPI:  1. Annual physical exam  Patient had hysterectomy , last pap was 2017 and was normal.  2. Gastroesophageal reflux disease without esophagitis  Is on protonix daily and has occasional breakthrough symotoms  3. BMI 33.0-33.9,adult  No recent weight changes  4. Mixed hyperlipidemia  Currently not watching diet and does very little exercise  5. Primary insomnia  Currently not using anything for sleep  6. Recurrent major depressive disorder, in full remission ([place])  Is on celexa daily. Working well without symptoms Depression screen [facility] 2/9 01/15/2018 12/11/2017 07/05/2017  Decreased Interest 0 0 0  Down, Depressed, Hopeless 0 0 0  PHQ - 2 Score 0 0 0     7. GAD (generalized anxiety disorder)  Is on ativan prn. Usually only needs because of work.  8. Moderate persistent asthma with acute exacerbation  Has asthma which is doing well right now.    Outpatient Encounter Medications as of 01/15/2018  Medication Sig  . albuterol (PROVENTIL HFA;VENTOLIN HFA) 108 (90 Base) MCG/ACT inhaler Inhale 2 puffs into the lungs every 6 (six) hours as needed for wheezing.  . beclomethasone (QVAR REDIHALER) 80 MCG/ACT inhaler Inhale 2 puffs into the lungs 2 (two) times daily.  . citalopram (CELEXA) 40 MG tablet TAKE 1 TABLET BY MOUTH EVERY DAY, needs to be seen  . fluticasone (FLONASE) 50 MCG/ACT nasal spray Place 2 sprays into both nostrils daily.  Marland Kitchen loratadine-pseudoephedrine (LORATADINE-D 24HR) 10-240 MG 24 hr tablet TAKE 1 TABLET BY MOUTH ONCE DAILY  . LORazepam (ATIVAN) 1 MG tablet Take 1 tablet (1 mg total) by mouth every 8 (eight) hours as needed.  . pantoprazole (PROTONIX) 40 MG tablet Take 1 tablet (40 mg total) by mouth 2 (two) times daily.  . rosuvastatin (CRESTOR) 40 MG tablet Take 1 tablet (40 mg total) by mouth daily.      New  complaints: Needs suppository for internal hemorrhoids  Social history: Is [place] at [employer]   Review of Systems  Constitutional: Negative for activity change and appetite change.  HENT: Negative.   Eyes: Negative for pain.  Respiratory: Negative for shortness of breath.   Cardiovascular: Negative for chest pain, palpitations and leg swelling.  Gastrointestinal: Negative for abdominal pain.  Endocrine: Negative for polydipsia.  Genitourinary: Negative.   Skin: Negative for rash.  Neurological: Negative for dizziness, weakness and headaches.  Hematological: Does not bruise/bleed easily.  Psychiatric/Behavioral: Negative.   All other systems reviewed and are negative.      Objective:   Physical Exam  Constitutional: She is oriented to person, place, and time. She appears well-developed and well-nourished. No distress.  HENT:  Head: Normocephalic.  Nose: Nose normal.  Mouth/Throat: Oropharynx is clear and moist.  Eyes: Pupils are equal, round, and reactive to light. EOM are normal.  Neck: Normal range of motion. Neck supple. No JVD present. Carotid bruit is not present.  Cardiovascular: Normal rate, regular rhythm, normal heart sounds and intact distal pulses.  Pulmonary/Chest: Effort normal and breath sounds normal. No respiratory distress. She has no wheezes. She has no rales. She exhibits no tenderness.  Abdominal: Soft. Normal appearance, normal aorta and bowel sounds are normal. She exhibits no distension, no abdominal bruit, no pulsatile midline mass and no mass. There is no splenomegaly or hepatomegaly. There is no tenderness.  Musculoskeletal: Normal range of motion. She exhibits no edema.  Lymphadenopathy:    She has no cervical adenopathy.  Neurological: She is alert and oriented to person, place, and time. She has normal reflexes.  Skin: Skin is warm and dry.  Psychiatric: She has a normal mood and affect. Her behavior is normal. Judgment and thought  content normal.  Nursing note and vitals reviewed.   BP 121/74   Pulse 82   Temp 98 F (36.7 C) (Oral)   Ht 5\' 6"  (1.676 m)   Wt 204 lb (92.5 kg)   BMI 32.93 kg/m        Assessment & Plan:  Crystal Wong comes in today with chief complaint of Annual Exam (No pap)   Diagnosis and orders addressed:  1. Annual physical exam - CBC with Differential/Platelet - Thyroid Panel With TSH  2. Gastroesophageal reflux disease without esophagitis Avoid spicy foods Do not eat 2 hours prior to bedtime  3. BMI 33.0-33.9,adult Discussed diet and exercise for person with BMI >25 Will recheck weight in 3-6 months  4. Mixed hyperlipidemia Low fat diet encoouraged  5. Primary insomnia Bedtime routin discussed  6. Recurrent major depressive disorder, in full remission ([place]) Tress management  7. GAD (generalized anxiety disorder) Again stress management - LORazepam (ATIVAN) 1 MG tablet; Take 1 tablet (1 mg total) by mouth every 8 (eight) hours as needed.  Dispense: 30 tablet; Refill: 5  8. Moderate persistent asthma with acute exacerbation  9. Hemorrhoids, internal, with bleeding Continue miralx daily - hydrocortisone (ANUSOL-HC) 25 MG suppository; Place 1 suppository (25 mg total) rectally 2 (two) times daily.  Dispense: 12 suppository; Refill: 0   Labs pending Health Maintenance reviewed Diet and exercise encouraged  Follow up plan: 6 months   [name] Done, FNP

## 2018-01-15 NOTE — Patient Instructions (Signed)
Insomnia Insomnia is a sleep disorder that makes it difficult to fall asleep or to stay asleep. Insomnia can cause tiredness (fatigue), low energy, difficulty concentrating, mood swings, and poor performance at work or school. There are three different ways to classify insomnia:  Difficulty falling asleep.  Difficulty staying asleep.  Waking up too early in the morning.  Any type of insomnia can be long-term (chronic) or short-term (acute). Both are common. Short-term insomnia usually lasts for three months or less. Chronic insomnia occurs at least three times a week for longer than three months. What are the causes? Insomnia may be caused by another condition, situation, or substance, such as:  Anxiety.  Certain medicines.  Gastroesophageal reflux disease (GERD) or other gastrointestinal conditions.  Asthma or other breathing conditions.  Restless legs syndrome, sleep apnea, or other sleep disorders.  Chronic pain.  Menopause. This may include hot flashes.  Stroke.  Abuse of alcohol, tobacco, or illegal drugs.  Depression.  Caffeine.  Neurological disorders, such as Alzheimer disease.  An overactive thyroid (hyperthyroidism).  The cause of insomnia may not be known. What increases the risk? Risk factors for insomnia include:  Gender. Women are more commonly affected than men.  Age. Insomnia is more common as you get older.  Stress. This may involve your professional or personal life.  Income. Insomnia is more common in people with lower income.  Lack of exercise.  Irregular work schedule or night shifts.  Traveling between different time zones.  What are the signs or symptoms? If you have insomnia, trouble falling asleep or trouble staying asleep is the main symptom. This may lead to other symptoms, such as:  Feeling fatigued.  Feeling nervous about going to sleep.  Not feeling rested in the morning.  Having trouble concentrating.  Feeling  irritable, anxious, or depressed.  How is this treated? Treatment for insomnia depends on the cause. If your insomnia is caused by an underlying condition, treatment will focus on addressing the condition. Treatment may also include:  Medicines to help you sleep.  Counseling or therapy.  Lifestyle adjustments.  Follow these instructions at home:  Take medicines only as directed by your health care provider.  Keep regular sleeping and waking hours. Avoid naps.  Keep a sleep diary to help you and your health care provider figure out what could be causing your insomnia. Include: ? When you sleep. ? When you wake up during the night. ? How well you sleep. ? How rested you feel the next day. ? Any side effects of medicines you are taking. ? What you eat and drink.  Make your bedroom a comfortable place where it is easy to fall asleep: ? Put up shades or special blackout curtains to block light from outside. ? Use a white noise machine to block noise. ? Keep the temperature cool.  Exercise regularly as directed by your health care provider. Avoid exercising right before bedtime.  Use relaxation techniques to manage stress. Ask your health care provider to suggest some techniques that may work well for you. These may include: ? Breathing exercises. ? Routines to release muscle tension. ? Visualizing peaceful scenes.  Cut back on alcohol, caffeinated beverages, and cigarettes, especially close to bedtime. These can disrupt your sleep.  Do not overeat or eat spicy foods right before bedtime. This can lead to digestive discomfort that can make it hard for you to sleep.  Limit screen use before bedtime. This includes: ? Watching TV. ? Using your smartphone, tablet, and   computer.  Stick to a routine. This can help you fall asleep faster. Try to do a quiet activity, brush your teeth, and go to bed at the same time each night.  Get out of bed if you are still awake after 15 minutes  of trying to sleep. Keep the lights down, but try reading or doing a quiet activity. When you feel sleepy, go back to bed.  Make sure that you drive carefully. Avoid driving if you feel very sleepy.  Keep all follow-up appointments as directed by your health care provider. This is important. Contact a health care provider if:  You are tired throughout the day or have trouble in your daily routine due to sleepiness.  You continue to have sleep problems or your sleep problems get worse. Get help right away if:  You have serious thoughts about hurting yourself or someone else. This information is not intended to replace advice given to you by your health care provider. Make sure you discuss any questions you have with your health care provider. Document Released: 02/18/2000 Document Revised: 07/23/2015 Document Reviewed: 11/21/2013 Elsevier Interactive Patient Education  2018 [employer]. Hemorrhoids Hemorrhoids are swollen veins in and around the rectum or anus. Hemorrhoids can cause pain, itching, or bleeding. Most of the time, they do not cause serious problems. They usually get better with diet changes, lifestyle changes, and other home treatments. Follow these instructions at home: Eating and drinking  Eat foods that have fiber, such as whole grains, beans, nuts, fruits, and vegetables. Ask your doctor about taking products that have added fiber (fibersupplements).  Drink enough fluid to keep your pee (urine) clear or pale yellow. For Pain and Swelling  Take a warm-water bath (sitz bath) for 20 minutes to ease pain. Do this 3-4 times a day.  If directed, put ice on the painful area. It may be helpful to use ice between your warm baths. ? Put ice in a plastic bag. ? Place a towel between your skin and the bag. ? Leave the ice on for 20 minutes, 2-3 times a day. General instructions  Take over-the-counter and prescription medicines only as told by your doctor. ? Medicated creams  and medicines that are inserted into the anus (suppositories) may be used or applied as told.  Exercise often.  Go to the bathroom when you have the urge to poop (to have a bowel movement). Do not wait.  Avoid pushing too hard (straining) when you poop.  Keep the butt area dry and clean. Use wet toilet paper or moist paper towels.  Do not sit on the toilet for a long time. Contact a doctor if:  You have any of these: ? Pain and swelling that do not get better with treatment or medicine. ? Bleeding that will not stop. ? Trouble pooping or you cannot poop. ? Pain or swelling outside the area of the hemorrhoids. This information is not intended to replace advice given to you by your health care provider. Make sure you discuss any questions you have with your health care provider. Document Released: 11/30/2007 Document Revised: 07/29/2015 Document Reviewed: 11/04/2014 Elsevier Interactive Patient Education  [employer].

## 2018-01-22 NOTE — Progress Notes (Signed)
Husband aware.

## 2018-02-15 ENCOUNTER — Telehealth: Payer: Self-pay | Admitting: Nurse Practitioner

## 2018-02-18 NOTE — Telephone Encounter (Signed)
Will need  To be seen-

## 2018-02-21 NOTE — Telephone Encounter (Signed)
LMOM that is she was still having symptoms to please call us and we would be glad to make her an appt

## 2018-04-17 ENCOUNTER — Ambulatory Visit: Payer: 59 | Admitting: Family Medicine

## 2018-04-18 ENCOUNTER — Encounter: Payer: Self-pay | Admitting: Family Medicine

## 2018-04-18 ENCOUNTER — Ambulatory Visit: Payer: 59 | Admitting: Family Medicine

## 2018-04-18 VITALS — BP 144/82 | HR 81 | Temp 97.8°F | Ht 66.0 in | Wt 202.1 lb

## 2018-04-18 DIAGNOSIS — J4521 Mild intermittent asthma with (acute) exacerbation: Secondary | ICD-10-CM

## 2018-04-18 DIAGNOSIS — J019 Acute sinusitis, unspecified: Secondary | ICD-10-CM | POA: Diagnosis not present

## 2018-04-18 DIAGNOSIS — K219 Gastro-esophageal reflux disease without esophagitis: Secondary | ICD-10-CM

## 2018-04-18 MED ORDER — AMOXICILLIN-POT CLAVULANATE 875-125 MG PO TABS: 1.0000 | ORAL_TABLET | Freq: Two times a day (BID) | ORAL | 0 refills | Status: AC

## 2018-04-18 MED ORDER — ALBUTEROL SULFATE HFA 108 (90 BASE) MCG/ACT IN AERS: 2.0000 | INHALATION_SPRAY | Freq: Four times a day (QID) | RESPIRATORY_TRACT | 2 refills | Status: DC | PRN

## 2018-04-18 MED ORDER — DEXLANSOPRAZOLE 30 MG PO CPDR: 30.0000 mg | DELAYED_RELEASE_CAPSULE | Freq: Every day | ORAL | 3 refills | Status: DC

## 2018-04-18 NOTE — Patient Instructions (Signed)

## 2018-04-18 NOTE — Progress Notes (Signed)
Subjective:  Patient ID: Crystal Wong, female    DOB: [birth year], 63 y.o.   MRN: [id number]  Chief Complaint:  Gastroesophageal Reflux (taking Protonix but seems to be worsening) and URI (cough, chest congestion; going out of country this weekend)   HPI: Crystal Wong is a 63 y.o. female presenting on 04/18/2018 for Gastroesophageal Reflux (taking Protonix but seems to be worsening) and URI (cough, chest congestion; going out of country this weekend)   1. Gastroesophageal reflux disease without esophagitis  Increasing symptoms over the last few weeks. Has been taking Protonix for years. States her symptoms have greatly increased over the last several weeks. States she has burning in her throat and chest. States she has an increased cough. She denies hemoptysis, voice changes, or dysphagia. She reports the symptoms are worse after eating certain foods and decreased when she drinks water.    2. Mild intermittent asthma with acute exacerbation  Having to use her Albuterol more frequently due to her cough. States she is only using her controlled inhaler once daily instead of twice daily. Denies chest pain or shortness of breath.    3. Acute rhinosinusitis  Cough, congestion, nasal drainage, sinus pressure, postnasal drainage, and ear pressure for 2 weeks. Slight sore throat with coughing. Low grade fever with chills. Has tried over the counter cold and cough remedies without relief of symptoms.      Relevant past medical, surgical, family, and social history reviewed and updated as indicated.  Allergies and medications reviewed and updated.   Past Medical History:  Diagnosis Date  . Anxiety   . GERD (gastroesophageal reflux disease)   . Hyperlipidemia   . Insomnia     History reviewed. No pertinent surgical history.  Social History   Socioeconomic History  . Marital status: Married    Spouse name: Not on file  . Number of children: Not on file  . Years of education: Not on  file  . Highest education level: Not on file  Occupational History  . Not on file  Social Needs  . Financial resource strain: Not on file  . Food insecurity:    Worry: Not on file    Inability: Not on file  . Transportation needs:    Medical: Not on file    Non-medical: Not on file  Tobacco Use  . Smoking status: Never Smoker  . Smokeless tobacco: Never Used  Substance and Sexual Activity  . Alcohol use: No  . Drug use: No  . Sexual activity: Not on file  Lifestyle  . Physical activity:    Days per week: Not on file    Minutes per session: Not on file  . Stress: Not on file  Relationships  . Social connections:    Talks on phone: Not on file    Gets together: Not on file    Attends religious service: Not on file    Active member of club or organization: Not on file    Attends meetings of clubs or organizations: Not on file    Relationship status: Not on file  . Intimate partner violence:    Fear of current or ex partner: Not on file    Emotionally abused: Not on file    Physically abused: Not on file    Forced sexual activity: Not on file  Other Topics Concern  . Not on file  Social History Narrative  . Not on file    Outpatient Encounter Medications as of 04/18/2018  Medication Sig  . albuterol (PROVENTIL HFA;VENTOLIN HFA) 108 (90 Base) MCG/ACT inhaler Inhale 2 puffs into the lungs every 6 (six) hours as needed for wheezing.  . beclomethasone (QVAR REDIHALER) 80 MCG/ACT inhaler Inhale 2 puffs into the lungs 2 (two) times daily.  . citalopram (CELEXA) 40 MG tablet TAKE 1 TABLET BY MOUTH EVERY DAY, needs to be seen  . fluticasone (FLONASE) 50 MCG/ACT nasal spray Place 2 sprays into both nostrils daily.  . hydrocortisone (ANUSOL-HC) 25 MG suppository Place 1 suppository (25 mg total) rectally 2 (two) times daily.  Marland Kitchen loratadine-pseudoephedrine (LORATADINE-D 24HR) 10-240 MG 24 hr tablet TAKE 1 TABLET BY MOUTH ONCE DAILY  . LORazepam (ATIVAN) 1 MG tablet Take 1 tablet (1  mg total) by mouth every 8 (eight) hours as needed.  . rosuvastatin (CRESTOR) 40 MG tablet Take 1 tablet (40 mg total) by mouth daily.  . [DISCONTINUED] albuterol (PROVENTIL HFA;VENTOLIN HFA) 108 (90 Base) MCG/ACT inhaler Inhale 2 puffs into the lungs every 6 (six) hours as needed for wheezing.  . [DISCONTINUED] pantoprazole (PROTONIX) 40 MG tablet Take 1 tablet (40 mg total) by mouth 2 (two) times daily.  Marland Kitchen amoxicillin-clavulanate (AUGMENTIN) 875-125 MG tablet Take 1 tablet by mouth 2 (two) times daily for 7 days.  Marland Kitchen Dexlansoprazole 30 MG capsule Take 1 capsule (30 mg total) by mouth daily for 30 days.   No facility-administered encounter medications on file as of 04/18/2018.     Allergies  Allergen Reactions  . Sulfa Antibiotics Anaphylaxis  . Sulfamethoxazole-Trimethoprim Swelling  . Sulfur Other (See Comments)    Other reaction(s): Angioedema (ALLERGY/intolerance) Other reaction(s): Angioedema (ALLERGY/intolerance)    Review of Systems  Constitutional: Positive for chills, fatigue and fever.  HENT: Positive for congestion, ear pain, postnasal drip, rhinorrhea, sinus pressure, sinus pain and sore throat. Negative for trouble swallowing and voice change.   Respiratory: Positive for cough, chest tightness and wheezing. Negative for shortness of breath.   Cardiovascular: Negative for chest pain, palpitations and leg swelling.  Gastrointestinal: Positive for abdominal distention. Negative for abdominal pain, anal bleeding, blood in stool, constipation, diarrhea, nausea, rectal pain and vomiting.  Musculoskeletal: Negative for arthralgias and myalgias.  Neurological: Positive for headaches. Negative for dizziness, weakness and light-headedness.  Psychiatric/Behavioral: Negative for confusion.  All other systems reviewed and are negative.       Objective:  BP (!) 144/82   Pulse 81   Temp 97.8 F (36.6 C) (Oral)   Ht 5' 6"  (1.676 m)   Wt 202 lb 2 oz (91.7 kg)   BMI 32.62 kg/m     Wt Readings from Last 3 Encounters:  04/18/18 202 lb 2 oz (91.7 kg)  01/15/18 204 lb (92.5 kg)  12/11/17 200 lb (90.7 kg)    Physical Exam Vitals signs and nursing note reviewed.  Constitutional:      General: She is in acute distress (mild).     Appearance: Normal appearance. She is well-developed and well-groomed. She is not ill-appearing or toxic-appearing.  HENT:     Head: Normocephalic and atraumatic.     Jaw: There is normal jaw occlusion.     Right Ear: Hearing, ear canal and external ear normal. A middle ear effusion is present. Tympanic membrane is not perforated or erythematous.     Left Ear: Hearing, ear canal and external ear normal. A middle ear effusion is present. Tympanic membrane is not perforated or erythematous.     Nose: Mucosal edema, congestion and rhinorrhea present. Rhinorrhea is purulent.  Right Turbinates: Swollen.     Left Turbinates: Swollen.     Right Sinus: Maxillary sinus tenderness and frontal sinus tenderness present.     Left Sinus: Maxillary sinus tenderness and frontal sinus tenderness present.     Mouth/Throat:     Lips: Pink.     Mouth: Mucous membranes are moist.     Pharynx: Uvula midline. No oropharyngeal exudate, posterior oropharyngeal erythema or uvula swelling.     Tonsils: No tonsillar exudate or tonsillar abscesses.  Eyes:     General: Lids are normal.     Conjunctiva/sclera: Conjunctivae normal.     Pupils: Pupils are equal, round, and reactive to light.  Neck:     Musculoskeletal: Full passive range of motion without pain and neck supple.  Cardiovascular:     Rate and Rhythm: Normal rate and regular rhythm.     Heart sounds: Normal heart sounds. No murmur. No friction rub. No gallop.   Pulmonary:     Effort: Pulmonary effort is normal.     Breath sounds: Wheezing (mild, bilateral bases) present.  Lymphadenopathy:     Cervical: No cervical adenopathy.  Skin:    General: Skin is warm and dry.     Capillary Refill:  Capillary refill takes less than 2 seconds.     Coloration: Skin is not cyanotic or pale.  Neurological:     General: No focal deficit present.     Mental Status: She is alert and oriented to person, place, and time.  Psychiatric:        Mood and Affect: Mood normal.        Behavior: Behavior normal. Behavior is cooperative.        Thought Content: Thought content normal.        Judgment: Judgment normal.     Results for orders placed or performed in visit on 12/11/17  CMP14+EGFR  Result Value Ref Range   Glucose 112 (H) 65 - 99 mg/dL   BUN 19 8 - 27 mg/dL   Creatinine, Ser 0.94 0.57 - 1.00 mg/dL   GFR calc non Af Amer 66 >59 mL/min/1.73   GFR calc Af Amer 76 >59 mL/min/1.73   BUN/Creatinine Ratio 20 12 - 28   Sodium 141 134 - 144 mmol/L   Potassium 4.5 3.5 - 5.2 mmol/L   Chloride 100 96 - 106 mmol/L   CO2 26 20 - 29 mmol/L   Calcium 9.8 8.7 - 10.3 mg/dL   Total Protein 6.6 6.0 - 8.5 g/dL   Albumin 4.4 3.6 - 4.8 g/dL   Globulin, Total 2.2 1.5 - 4.5 g/dL   Albumin/Globulin Ratio 2.0 1.2 - 2.2   Bilirubin Total 0.4 0.0 - 1.2 mg/dL   Alkaline Phosphatase 77 39 - 117 IU/L   AST 15 0 - 40 IU/L   ALT 15 0 - 32 IU/L  Lipid panel  Result Value Ref Range   Cholesterol, Total 251 (H) 100 - 199 mg/dL   Triglycerides 125 0 - 149 mg/dL   HDL 55 >39 mg/dL   VLDL Cholesterol Cal 25 5 - 40 mg/dL   LDL Calculated 171 (H) 0 - 99 mg/dL   Chol/HDL Ratio 4.6 (H) 0.0 - 4.4 ratio       Pertinent labs & imaging results that were available during my care of the patient were reviewed by me and considered in my medical decision making.  Assessment & Plan:  Arnetra was seen today for gastroesophageal reflux and uri.  Diagnoses and all  orders for this visit:  Gastroesophageal reflux disease without esophagitis Failed Protonix therapy. Will trial Dexilant. Avoid triggers such as spicy foods, alcohol, and caffeine. Report any new or worsening symptoms. Return for reevaluation in 6-8 weeks. May  need referral to GI if symptoms do not improve.  -     Dexlansoprazole 30 MG capsule; Take 1 capsule (30 mg total) by mouth daily for 30 days.  Mild intermittent asthma with acute exacerbation Has only been using Qvar once daily, will increase to twice daily as prescribed. Report any new or worsening symptoms.  -     albuterol (PROVENTIL HFA;VENTOLIN HFA) 108 (90 Base) MCG/ACT inhaler; Inhale 2 puffs into the lungs every 6 (six) hours as needed for wheezing.  Acute rhinosinusitis Symptomatic care discussed. Frequent saline nasal sprays. Continue Flonase. Medications as prescribed. Report any new or worsening symptoms.  -     amoxicillin-clavulanate (AUGMENTIN) 875-125 MG tablet; Take 1 tablet by mouth 2 (two) times daily for 7 days.     Continue all other maintenance medications.  Follow up plan: Return in about 8 weeks (around 06/13/2018), or if symptoms worsen or fail to improve, for GERD.  Educational handout given for sinusitis   The above assessment and management plan was discussed with the patient. The patient verbalized understanding of and has agreed to the management plan. Patient is aware to call the clinic if symptoms persist or worsen. Patient is aware when to return to the clinic for a follow-up visit. Patient educated on when it is appropriate to go to the emergency department.   Monia Pouch, FNP-C [facility] [phone]

## 2018-04-29 ENCOUNTER — Encounter: Payer: Self-pay | Admitting: Family

## 2018-04-29 ENCOUNTER — Ambulatory Visit: Payer: 59 | Admitting: Family

## 2018-04-29 VITALS — BP 116/66 | HR 80 | Temp 98.2°F | Ht 66.0 in | Wt 199.6 lb

## 2018-04-29 DIAGNOSIS — R059 Cough, unspecified: Secondary | ICD-10-CM

## 2018-04-29 DIAGNOSIS — R05 Cough: Secondary | ICD-10-CM

## 2018-04-29 DIAGNOSIS — J029 Acute pharyngitis, unspecified: Secondary | ICD-10-CM | POA: Diagnosis not present

## 2018-04-29 DIAGNOSIS — J101 Influenza due to other identified influenza virus with other respiratory manifestations: Secondary | ICD-10-CM | POA: Diagnosis not present

## 2018-04-29 LAB — VERITOR FLU A/B WAIVED
Influenza A: NEGATIVE
Influenza B: POSITIVE — AB

## 2018-04-29 LAB — CULTURE, GROUP A STREP

## 2018-04-29 LAB — RAPID STREP SCREEN (MED CTR MEBANE ONLY): Strep Gp A Ag, IA W/Reflex: NEGATIVE

## 2018-04-29 MED ORDER — OSELTAMIVIR PHOSPHATE 75 MG PO CAPS: 75.0000 mg | ORAL_CAPSULE | Freq: Two times a day (BID) | ORAL | 0 refills | Status: DC

## 2018-04-29 MED ORDER — NYSTATIN 100000 UNIT/ML MT SUSP: 5.0000 mL | Freq: Four times a day (QID) | OROMUCOSAL | 0 refills | Status: DC

## 2018-04-29 NOTE — Progress Notes (Signed)
Subjective:    Patient ID: Crystal Wong, female    DOB: 12-29-1955, 63 y.o.   MRN: [id number]  Chief Complaint  Patient presents with  . Cough    was out of country last week,([place] , [place])  . Ear Pain  . sick for two months   PT presents to the office today with cough. She states she had a sinusitis on 04/18/18 and was given Augmentin. She states she was feeling slightly better, but starting coughing Saturday.  Cough  This is a new problem. The current episode started in the past 7 days (Saturday). The problem has been gradually worsening. The problem occurs every few minutes. The cough is productive of sputum. Associated symptoms include chills, ear congestion, ear pain, myalgias, nasal congestion, postnasal drip, a sore throat and wheezing. Pertinent negatives include no fever or headaches. The symptoms are aggravated by lying down. She has tried rest and OTC cough suppressant (has been treated for sinusitis ) for the symptoms. The treatment provided mild relief.      Review of Systems  Constitutional: Positive for chills. Negative for fever.  HENT: Positive for ear pain, postnasal drip and sore throat.   Respiratory: Positive for cough and wheezing.   Musculoskeletal: Positive for myalgias.  Neurological: Negative for headaches.  All other systems reviewed and are negative.      Objective:   Physical Exam Vitals signs reviewed.  Constitutional:      General: She is not in acute distress.    Appearance: She is well-developed. She is ill-appearing.  HENT:     Head: Normocephalic and atraumatic.     Nose: Mucosal edema and rhinorrhea present.     Mouth/Throat:     Pharynx: Posterior oropharyngeal erythema present.  Eyes:     Pupils: Pupils are equal, round, and reactive to light.  Neck:     Musculoskeletal: Normal range of motion and neck supple.     Thyroid: No thyromegaly.  Cardiovascular:     Rate and Rhythm: Normal rate and regular rhythm.    Heart sounds: Normal heart sounds. No murmur.  Pulmonary:     Effort: Pulmonary effort is normal. No respiratory distress.     Breath sounds: Normal breath sounds. No wheezing.     Comments: Intermittent nonproductive cough Abdominal:     General: Bowel sounds are normal. There is no distension.     Palpations: Abdomen is soft.     Tenderness: There is no abdominal tenderness.  Musculoskeletal: Normal range of motion.        General: No tenderness.  Skin:    General: Skin is warm and dry.  Neurological:     Mental Status: She is alert and oriented to person, place, and time.     Cranial Nerves: No cranial nerve deficit.     Deep Tendon Reflexes: Reflexes are normal and symmetric.  Psychiatric:        Behavior: Behavior normal.        Thought Content: Thought content normal.        Judgment: Judgment normal.     BP 116/66   Pulse 80   Temp 98.2 F (36.8 C) (Oral)   Ht 5\' 6"  (1.676 m)   Wt 199 lb 9.6 oz (90.5 kg)   BMI 32.22 kg/m      Assessment & Plan:  Crystal Wong comes in today with chief complaint of Cough (was out of country last week,([place] , [place])); Ear Pain; and sick  for two months   Diagnosis and orders addressed:  1. Cough - Veritor Flu A/B Waived  2. Sore throat  - Rapid Strep Screen ([facility] ONLY)  3. Influenza B Force fluids Rest Tylenol or Motrin  Droplet precautions discussed RTO if symptoms worsen or do not improve  - oseltamivir (TAMIFLU) 75 MG capsule; Take 1 capsule (75 mg total) by mouth 2 (two) times daily.  Dispense: 10 capsule; Refill: 0   Evelina Dun, FNP

## 2018-04-29 NOTE — Patient Instructions (Signed)

## 2018-05-01 ENCOUNTER — Telehealth: Payer: Self-pay | Admitting: Nurse Practitioner

## 2018-05-01 MED ORDER — PREDNISONE 20 MG PO TABS: ORAL_TABLET | ORAL | 0 refills | Status: DC

## 2018-05-01 NOTE — Telephone Encounter (Signed)
ED sent prednisone

## 2018-05-01 NOTE — Telephone Encounter (Signed)
Patient was seen on 04/18/2018 and treated for sinusitis with Augmentin, she was seen again on 04/29/2018 and tested positive for flu b and given Tamiflu.  She is not feeling any better, is extremely hoarse and has lost her voice, has cough.  Husband wondering if we could send in a Prednisone dose pack along with the Tamiflu she is already taking.  Please advise.

## 2018-05-01 NOTE — Telephone Encounter (Signed)
Patient's husband aware that Prednisone sent to pharmacy

## 2018-05-08 ENCOUNTER — Telehealth: Payer: Self-pay | Admitting: Nurse Practitioner

## 2018-05-08 NOTE — Telephone Encounter (Signed)
Left message.  These are normal symptoms that may continue for weeks.  Try OTC medications for relief.  Tylenol,  motrin, nasal spray and stay hydrated.  Call for any questions.

## 2018-05-16 NOTE — Telephone Encounter (Signed)
Pt has appt on 05/17/18

## 2018-05-16 NOTE — Telephone Encounter (Signed)
From today's note please call pt at work # [phone] per husband who said someone called their home instead.

## 2018-05-16 NOTE — Telephone Encounter (Signed)
PT she is wanting to talk to a nurse seen for flu at end of Feb and her cough is not better, she was told last week that it was just part of the flu, pt its not better and she wants to talk to nurse about it

## 2018-05-17 ENCOUNTER — Ambulatory Visit (INDEPENDENT_AMBULATORY_CARE_PROVIDER_SITE_OTHER): Payer: 59

## 2018-05-17 ENCOUNTER — Encounter: Payer: Self-pay | Admitting: Nurse Practitioner

## 2018-05-17 ENCOUNTER — Other Ambulatory Visit: Payer: Self-pay

## 2018-05-17 ENCOUNTER — Ambulatory Visit: Payer: 59 | Admitting: Nurse Practitioner

## 2018-05-17 VITALS — BP 112/75 | HR 91 | Temp 98.6°F | Ht 66.0 in | Wt 204.0 lb

## 2018-05-17 DIAGNOSIS — R05 Cough: Secondary | ICD-10-CM

## 2018-05-17 DIAGNOSIS — R059 Cough, unspecified: Secondary | ICD-10-CM

## 2018-05-17 MED ORDER — PREDNISONE 10 MG (21) PO TBPK: ORAL_TABLET | ORAL | 0 refills | Status: DC

## 2018-05-17 MED ORDER — HYDROCODONE-HOMATROPINE 5-1.5 MG/5ML PO SYRP: 5.0000 mL | ORAL_SOLUTION | Freq: Four times a day (QID) | ORAL | 0 refills | Status: DC | PRN

## 2018-05-17 NOTE — Progress Notes (Signed)
Subjective:    Patient ID: Crystal Wong, female    DOB: 1956/01/26, 63 y.o.   MRN: [id number]   Chief Complaint: cough  HPI Patient come sin today for recheck. She went on a cruise the first of February and has been sick since she came back. She was seen on 04/29/18 and tested positive for flu and was given tamiflu. She called back on 05/01/18 still c/o cough. Prednisone was sent into pharmacy. She called again on 05/08/18 still c/o cough. She come sin today still c/o cough ( does have history of asthma ). She says cough is productive. She gets sob when she has a coughing fit. Has been taking mucinex at  Night.   Review of Systems  Constitutional: Negative for chills and fever.  HENT: Positive for congestion.   Respiratory: Positive for cough (nonproductive).   Cardiovascular: Negative.   Endocrine: Positive for polyphagia.  Musculoskeletal: Negative.   Neurological: Negative.   Psychiatric/Behavioral: Negative.   All other systems reviewed and are negative.      Objective:   Physical Exam Constitutional:      Appearance: She is normal weight.  HENT:     Right Ear: Hearing, tympanic membrane, ear canal and external ear normal.     Left Ear: Hearing, tympanic membrane, ear canal and external ear normal.     Nose: Mucosal edema, congestion and rhinorrhea present.     Right Sinus: No maxillary sinus tenderness or frontal sinus tenderness.     Left Sinus: No maxillary sinus tenderness or frontal sinus tenderness.     Mouth/Throat:     Lips: Pink.     Pharynx: Oropharynx is clear.     Tonsils: Swelling: 0 on the right. 0 on the left.  Neck:     Musculoskeletal: Normal range of motion and neck supple.  Cardiovascular:     Rate and Rhythm: Normal rate and regular rhythm.     Heart sounds: Normal heart sounds.  Pulmonary:     Effort: Pulmonary effort is normal.     Breath sounds: Normal breath sounds. No wheezing, rhonchi or rales.     Comments: Dry hacky cough Lymphadenopathy:      Cervical: No cervical adenopathy.  Skin:    General: Skin is warm.  Neurological:     General: No focal deficit present.     Mental Status: She is alert and oriented to person, place, and time.  Psychiatric:        Mood and Affect: Mood normal.        Behavior: Behavior normal.    BP 112/75   Pulse 91   Temp 98.6 F (37 C) (Oral)   Ht 5\' 6"  (1.676 m)   Wt 204 lb (92.5 kg)   BMI 32.93 kg/m '     Assessment & Plan:  Crystal Wong in today with chief complaint of Cough (had for three weeks)   1. Cough 1. Take meds as prescribed 2. Use a cool mist humidifier especially during the winter months and when heat has been humid. 3. Use saline nose sprays frequently 4. Saline irrigations of the nose can be very helpful if done frequently.  * 4X daily for 1 week*  * Use of a nettie pot can be helpful with this. Follow directions with this* 5. Drink plenty of fluids 6. Keep thermostat turn down low 7.For any cough or congestion  Use plain Mucinex- regular strength or max strength is fine   * Children- consult  with Pharmacist for dosing 8. For fever or aces or pains- take tylenol or ibuprofen appropriate for age and weight.  * for fevers greater than 101 orally you may alternate ibuprofen and tylenol every  3 hours.    - DG Chest 2 View; Future - HYDROcodone-homatropine (HYCODAN) 5-1.5 MG/5ML syrup; Take 5 mLs by mouth every 6 (six) hours as needed for cough.  Dispense: 120 mL; Refill: 0 - predniSONE (STERAPRED UNI-PAK 21 TAB) 10 MG (21) TBPK tablet; As directed x 6 days  Dispense: 21 tablet; Refill: 0  [name] Done, FNP

## 2018-05-17 NOTE — Patient Instructions (Signed)

## 2018-05-24 ENCOUNTER — Other Ambulatory Visit: Payer: Self-pay | Admitting: Nurse Practitioner

## 2018-05-24 DIAGNOSIS — F3342 Major depressive disorder, recurrent, in full remission: Secondary | ICD-10-CM

## 2018-06-14 ENCOUNTER — Other Ambulatory Visit: Payer: Self-pay | Admitting: Nurse Practitioner

## 2018-06-14 DIAGNOSIS — K219 Gastro-esophageal reflux disease without esophagitis: Secondary | ICD-10-CM

## 2018-07-13 ENCOUNTER — Other Ambulatory Visit: Payer: Self-pay | Admitting: Family Medicine

## 2018-07-13 DIAGNOSIS — J4521 Mild intermittent asthma with (acute) exacerbation: Secondary | ICD-10-CM

## 2018-07-26 ENCOUNTER — Other Ambulatory Visit: Payer: Self-pay | Admitting: Nurse Practitioner

## 2018-08-02 ENCOUNTER — Other Ambulatory Visit: Payer: Self-pay | Admitting: Family Medicine

## 2018-08-02 DIAGNOSIS — K219 Gastro-esophageal reflux disease without esophagitis: Secondary | ICD-10-CM

## 2018-08-05 ENCOUNTER — Ambulatory Visit (INDEPENDENT_AMBULATORY_CARE_PROVIDER_SITE_OTHER): Payer: 59 | Admitting: Family Medicine

## 2018-08-05 ENCOUNTER — Encounter: Payer: Self-pay | Admitting: Family Medicine

## 2018-08-05 ENCOUNTER — Other Ambulatory Visit: Payer: Self-pay

## 2018-08-05 ENCOUNTER — Ambulatory Visit: Payer: 59 | Admitting: Family Medicine

## 2018-08-05 DIAGNOSIS — M722 Plantar fascial fibromatosis: Secondary | ICD-10-CM

## 2018-08-05 MED ORDER — PREDNISONE 20 MG PO TABS: ORAL_TABLET | ORAL | 0 refills | Status: DC

## 2018-08-05 MED ORDER — NAPROXEN 500 MG PO TABS: 500.0000 mg | ORAL_TABLET | Freq: Two times a day (BID) | ORAL | 0 refills | Status: AC

## 2018-08-05 NOTE — Progress Notes (Signed)
Virtual Visit via telephone Note Due to COVID-19, visit is conducted virtually and was requested by patient. This visit type was conducted due to national recommendations for restrictions regarding the COVID-19 Pandemic (e.g. social distancing) in an effort to limit this patient's exposure and mitigate transmission in our community. All issues noted in this document were discussed and addressed.  A physical exam was not performed with this format.   I connected with Crystal Wong on 08/05/18 at 0850 by telephone and verified that I am speaking with the correct person using two identifiers. Crystal Wong is currently located at home and family is currently with them during visit. The provider, Monia Pouch, FNP is located in their office at time of visit.  I discussed the limitations, risks, security and privacy concerns of performing an evaluation and management service by telephone and the availability of in person appointments. I also discussed with the patient that there may be a patient responsible charge related to this service. The patient expressed understanding and agreed to proceed.  Subjective:  Patient ID: Crystal Wong, female    DOB: 09-29-1955, 63 y.o.   MRN: [id number]  Chief Complaint:  Foot Pain   HPI: Crystal Wong is a 63 y.o. female presenting on 08/05/2018 for Foot Pain   Pt reports bilateral heel pain for over a month. States the pain is worse when she first gets out of bed and with certain shoes. States she does have pain with walking. States the pain is worse if she is nonambulatory for a while and then gets up to walk. She has tried moist heat and tylenol without relief of pain. No known injury.  Foot Pain  This is a recurrent problem. The current episode started more than 1 month ago. The problem occurs daily. The problem has been gradually worsening. Associated symptoms include arthralgias. Pertinent negatives include no chest pain, chills, coughing, fatigue, fever,  joint swelling, myalgias, neck pain, numbness or weakness. The symptoms are aggravated by standing, walking and exertion. She has tried acetaminophen, heat and walking for the symptoms. The treatment provided mild relief.     Relevant past medical, surgical, family, and social history reviewed and updated as indicated.  Allergies and medications reviewed and updated.   Past Medical History:  Diagnosis Date  . Anxiety   . GERD (gastroesophageal reflux disease)   . Hyperlipidemia   . Insomnia     No past surgical history on file.  Social History   Socioeconomic History  . Marital status: Married    Spouse name: Not on file  . Number of children: Not on file  . Years of education: Not on file  . Highest education level: Not on file  Occupational History  . Not on file  Social Needs  . Financial resource strain: Not on file  . Food insecurity:    Worry: Not on file    Inability: Not on file  . Transportation needs:    Medical: Not on file    Non-medical: Not on file  Tobacco Use  . Smoking status: Never Smoker  . Smokeless tobacco: Never Used  Substance and Sexual Activity  . Alcohol use: No  . Drug use: No  . Sexual activity: Not on file  Lifestyle  . Physical activity:    Days per week: Not on file    Minutes per session: Not on file  . Stress: Not on file  Relationships  . Social connections:    Talks on  phone: Not on file    Gets together: Not on file    Attends religious service: Not on file    Active member of club or organization: Not on file    Attends meetings of clubs or organizations: Not on file    Relationship status: Not on file  . Intimate partner violence:    Fear of current or ex partner: Not on file    Emotionally abused: Not on file    Physically abused: Not on file    Forced sexual activity: Not on file  Other Topics Concern  . Not on file  Social History Narrative  . Not on file    Outpatient Encounter Medications as of 08/05/2018   Medication Sig  . beclomethasone (QVAR REDIHALER) 80 MCG/ACT inhaler Inhale 2 puffs into the lungs 2 (two) times daily.  . citalopram (CELEXA) 40 MG tablet TAKE 1 TABLET BY MOUTH EVERY DAY  . DEXILANT 30 MG capsule TAKE 1 CAPSULE BY MOUTH DAILY FOR 30 DAYS.  . fluticasone (FLONASE) 50 MCG/ACT nasal spray Place 2 sprays into both nostrils daily.  Marland Kitchen HYDROcodone-homatropine (HYCODAN) 5-1.5 MG/5ML syrup Take 5 mLs by mouth every 6 (six) hours as needed for cough.  . hydrocortisone (ANUSOL-HC) 25 MG suppository Place 1 suppository (25 mg total) rectally 2 (two) times daily.  Marland Kitchen LORATADINE-D 24HR 10-240 MG 24 hr tablet Take 1 tablet by mouth once daily  . LORazepam (ATIVAN) 1 MG tablet Take 1 tablet (1 mg total) by mouth every 8 (eight) hours as needed.  . naproxen (NAPROSYN) 500 MG tablet Take 1 tablet (500 mg total) by mouth 2 (two) times daily with a meal for 14 days.  . pantoprazole (PROTONIX) 40 MG tablet TAKE 1 TABLET BY MOUTH TWICE A DAY  . predniSONE (DELTASONE) 20 MG tablet 2 po at sametime daily for 5 days  . rosuvastatin (CRESTOR) 40 MG tablet Take 1 tablet (40 mg total) by mouth daily.  . VENTOLIN HFA 108 (90 Base) MCG/ACT inhaler TAKE 2 PUFFS BY MOUTH EVERY 6 HOURS AS NEEDED FOR WHEEZE  . [DISCONTINUED] predniSONE (STERAPRED UNI-PAK 21 TAB) 10 MG (21) TBPK tablet As directed x 6 days   No facility-administered encounter medications on file as of 08/05/2018.     Allergies  Allergen Reactions  . Sulfa Antibiotics Anaphylaxis  . Sulfamethoxazole-Trimethoprim Swelling  . Sulfur Other (See Comments)    Other reaction(s): Angioedema (ALLERGY/intolerance) Other reaction(s): Angioedema (ALLERGY/intolerance)    Review of Systems  Constitutional: Negative for chills, fatigue and fever.  Respiratory: Negative for cough, chest tightness and shortness of breath.   Cardiovascular: Negative for chest pain, palpitations and leg swelling.  Musculoskeletal: Positive for arthralgias. Negative for  back pain, gait problem, joint swelling, myalgias, neck pain and neck stiffness.  Skin: Negative for wound.  Neurological: Negative for weakness and numbness.  Psychiatric/Behavioral: Negative for confusion.  All other systems reviewed and are negative.        Observations/Objective: No vital signs or physical exam, this was a telephone or virtual health encounter.  Pt alert and oriented, answers all questions appropriately, and able to speak in full sentences.    Assessment and Plan: Aleah was seen today for foot pain.  Diagnoses and all orders for this visit:  Plantar fasciitis, bilateral Reported symptoms consistent with plantar fascitis. Symptomatic care discussed. Exercises for stretching discussed in detail. Will trial oral therapies. Good arch support in shoes. If symptoms do not improve, may need trigger point injection. Follow up in 4  6 weeks. Report any new or worsening symptoms.  -     predniSONE (DELTASONE) 20 MG tablet; 2 po at sametime daily for 5 days -     naproxen (NAPROSYN) 500 MG tablet; Take 1 tablet (500 mg total) by mouth 2 (two) times daily with a meal for 14 days.     Follow Up Instructions: No follow-ups on file.    I discussed the assessment and treatment plan with the patient. The patient was provided an opportunity to ask questions and all were answered. The patient agreed with the plan and demonstrated an understanding of the instructions.   The patient was advised to call back or seek an in-person evaluation if the symptoms worsen or if the condition fails to improve as anticipated.  The above assessment and management plan was discussed with the patient. The patient verbalized understanding of and has agreed to the management plan. Patient is aware to call the clinic if symptoms persist or worsen. Patient is aware when to return to the clinic for a follow-up visit. Patient educated on when it is appropriate to go to the emergency department.    I  provided 15 minutes of non-face-to-face time during this encounter. The call started at 0850. The call ended at 0905. The other time was used for coordination of care.    Monia Pouch, FNP-C [facility] [address] [place], [place] [address] [phone]

## 2018-08-21 ENCOUNTER — Other Ambulatory Visit: Payer: Self-pay | Admitting: Nurse Practitioner

## 2018-08-21 DIAGNOSIS — F411 Generalized anxiety disorder: Secondary | ICD-10-CM

## 2018-08-23 ENCOUNTER — Other Ambulatory Visit: Payer: Self-pay

## 2018-08-26 ENCOUNTER — Other Ambulatory Visit: Payer: Self-pay

## 2018-08-26 ENCOUNTER — Ambulatory Visit (INDEPENDENT_AMBULATORY_CARE_PROVIDER_SITE_OTHER): Payer: 59 | Admitting: Nurse Practitioner

## 2018-08-26 ENCOUNTER — Encounter: Payer: Self-pay | Admitting: Nurse Practitioner

## 2018-08-26 VITALS — BP 133/75 | HR 83 | Temp 97.4°F | Ht 66.0 in | Wt 209.0 lb

## 2018-08-26 DIAGNOSIS — M722 Plantar fascial fibromatosis: Secondary | ICD-10-CM

## 2018-08-26 DIAGNOSIS — Z0000 Encounter for general adult medical examination without abnormal findings: Secondary | ICD-10-CM

## 2018-08-26 DIAGNOSIS — E782 Mixed hyperlipidemia: Secondary | ICD-10-CM

## 2018-08-26 DIAGNOSIS — Z0001 Encounter for general adult medical examination with abnormal findings: Secondary | ICD-10-CM

## 2018-08-26 DIAGNOSIS — K219 Gastro-esophageal reflux disease without esophagitis: Secondary | ICD-10-CM | POA: Diagnosis not present

## 2018-08-26 DIAGNOSIS — F5101 Primary insomnia: Secondary | ICD-10-CM | POA: Diagnosis not present

## 2018-08-26 DIAGNOSIS — F411 Generalized anxiety disorder: Secondary | ICD-10-CM

## 2018-08-26 DIAGNOSIS — F3342 Major depressive disorder, recurrent, in full remission: Secondary | ICD-10-CM

## 2018-08-26 DIAGNOSIS — Z6833 Body mass index (BMI) 33.0-33.9, adult: Secondary | ICD-10-CM

## 2018-08-26 LAB — MICROSCOPIC EXAMINATION
Bacteria, UA: NONE SEEN
RBC, Urine: NONE SEEN /hpf (ref 0–2)
Renal Epithel, UA: NONE SEEN /hpf

## 2018-08-26 LAB — URINALYSIS, COMPLETE
Bilirubin, UA: NEGATIVE
Glucose, UA: NEGATIVE
Ketones, UA: NEGATIVE
Nitrite, UA: NEGATIVE
Protein,UA: NEGATIVE
RBC, UA: NEGATIVE
Specific Gravity, UA: 1.015 (ref 1.005–1.030)
Urobilinogen, Ur: 0.2 mg/dL (ref 0.2–1.0)
pH, UA: 7 (ref 5.0–7.5)

## 2018-08-26 MED ORDER — CITALOPRAM HYDROBROMIDE 40 MG PO TABS: ORAL_TABLET | ORAL | 5 refills | Status: DC

## 2018-08-26 MED ORDER — DEXILANT 30 MG PO CPDR: DELAYED_RELEASE_CAPSULE | ORAL | 1 refills | Status: DC

## 2018-08-26 MED ORDER — NAPROXEN 500 MG PO TABS: 500.0000 mg | ORAL_TABLET | Freq: Two times a day (BID) | ORAL | 1 refills | Status: DC

## 2018-08-26 MED ORDER — ROSUVASTATIN CALCIUM 40 MG PO TABS: 40.0000 mg | ORAL_TABLET | Freq: Every day | ORAL | 1 refills | Status: DC

## 2018-08-26 MED ORDER — LORAZEPAM 1 MG PO TABS: 1.0000 mg | ORAL_TABLET | Freq: Three times a day (TID) | ORAL | 5 refills | Status: DC | PRN

## 2018-08-26 NOTE — Progress Notes (Signed)
Subjective:    Patient ID: Crystal Wong, female    DOB: 26-May-1955, 63 y.o.   MRN: [id number]   Chief Complaint: Annual Exam    HPI:  1. Annual physical exam Last pap was done 04/15/15 and was normal. She denies any vaginal bleeding  2. Mixed hyperlipidemia Tries to watch diet and does exercise on occasion  3. Gastroesophageal reflux disease without esophagitis Is on dexilant daily and works most days- she still has occasional symptoms.  4. Primary insomnia Has been sleeping good lately  5. Recurrent major depressive disorder, in full remission ([place]) Is currently on celexa daily. Works well for her. Depression screen [facility] 2/9 08/26/2018 04/29/2018 04/18/2018  Decreased Interest 0 0 0  Down, Depressed, Hopeless 0 0 0  PHQ - 2 Score 0 0 0     6. GAD (generalized anxiety disorder) is on ativan prn. Takes 1-2 x a week.  7. BMI 33.0-33.9,adult Weight is up 10lbs from previous visit    Outpatient Encounter Medications as of 08/26/2018  Medication Sig  . beclomethasone (QVAR REDIHALER) 80 MCG/ACT inhaler Inhale 2 puffs into the lungs 2 (two) times daily.  . citalopram (CELEXA) 40 MG tablet TAKE 1 TABLET BY MOUTH EVERY DAY  . DEXILANT 30 MG capsule TAKE 1 CAPSULE BY MOUTH DAILY FOR 30 DAYS.  . fluticasone (FLONASE) 50 MCG/ACT nasal spray Place 2 sprays into both nostrils daily.  Marland Kitchen HYDROcodone-homatropine (HYCODAN) 5-1.5 MG/5ML syrup Take 5 mLs by mouth every 6 (six) hours as needed for cough.  . hydrocortisone (ANUSOL-HC) 25 MG suppository Place 1 suppository (25 mg total) rectally 2 (two) times daily.  Marland Kitchen LORATADINE-D 24HR 10-240 MG 24 hr tablet Take 1 tablet by mouth once daily  . LORazepam (ATIVAN) 1 MG tablet Take 1 tablet (1 mg total) by mouth every 8 (eight) hours as needed.  . pantoprazole (PROTONIX) 40 MG tablet TAKE 1 TABLET BY MOUTH TWICE A DAY  . predniSONE (DELTASONE) 20 MG tablet 2 po at sametime daily for 5 days  . rosuvastatin (CRESTOR) 40 MG tablet Take 1 tablet  (40 mg total) by mouth daily.  . VENTOLIN HFA 108 (90 Base) MCG/ACT inhaler TAKE 2 PUFFS BY MOUTH EVERY 6 HOURS AS NEEDED FOR WHEEZE     Family History  Problem Relation Age of Onset  . Cancer Father     New complaints: Pain in bil heels- was put on naprosyn that helped-she is out   Social history: Works as [employer]: Negative for activity change and appetite change.  HENT: Negative.   Eyes: Negative for pain.  Respiratory: Negative for shortness of breath.   Cardiovascular: Negative for chest pain, palpitations and leg swelling.  Gastrointestinal: Negative for abdominal pain.  Endocrine: Negative for polydipsia.  Genitourinary: Negative.   Musculoskeletal: Negative.   Skin: Negative for rash.  Neurological: Negative for dizziness, weakness and headaches.  Hematological: Does not bruise/bleed easily.  Psychiatric/Behavioral: Negative.   All other systems reviewed and are negative.      Objective:   Physical Exam Vitals signs and nursing note reviewed.  Constitutional:      General: She is not in acute distress.    Appearance: Normal appearance. She is well-developed.  HENT:     Head: Normocephalic.     Nose: Nose normal.  Eyes:     Pupils: Pupils are equal, round, and reactive to light.  Neck:     Musculoskeletal: Normal range of motion and  neck supple.     Vascular: No carotid bruit or JVD.  Cardiovascular:     Rate and Rhythm: Normal rate and regular rhythm.     Heart sounds: Normal heart sounds.  Pulmonary:     Effort: Pulmonary effort is normal. No respiratory distress.     Breath sounds: Normal breath sounds. No wheezing or rales.  Chest:     Chest wall: No tenderness.  Abdominal:     General: Bowel sounds are normal. There is no distension or abdominal bruit.     Palpations: Abdomen is soft. There is no hepatomegaly, splenomegaly, mass or pulsatile mass.     Tenderness: There is no abdominal tenderness.   Genitourinary:    General: Normal vulva.     Vagina: No vaginal discharge.     Comments: Cervix parous and pink No adnexal masses or tenderness nonthrombosed hemorrhoids Musculoskeletal: Normal range of motion.  Lymphadenopathy:     Cervical: No cervical adenopathy.  Skin:    General: Skin is warm and dry.  Neurological:     Mental Status: She is alert and oriented to person, place, and time.     Deep Tendon Reflexes: Reflexes are normal and symmetric.  Psychiatric:        Behavior: Behavior normal.        Thought Content: Thought content normal.        Judgment: Judgment normal.     BP 133/75   Pulse 83   Temp (!) 97.4 F (36.3 C) (Oral)   Ht _0  (1.676 m)   Wt 209 lb (94.8 kg)   BMI 33.73 kg/m   EKG- NSR-[name] Done, FNP      Assessment & Plan:  RENIE STELMACH comes in today with chief complaint of Annual Exam (foot pain)   Diagnosis and orders addressed:  1. Annual physical exam - Urinalysis, Complete - CBC with Differential/Platelet - Thyroid Panel With TSH - IGP, Aptima HPV, rfx 16/18,45  2. Mixed hyperlipidemia Low fat diet and exercise - CMP14+EGFR - Lipid panel - EKG 12-Lead - rosuvastatin (CRESTOR) 40 MG tablet; Take 1 tablet (40 mg total) by mouth daily.  Dispense: 90 tablet; Refill: 1  3. Gastroesophageal reflux disease without esophagitis Avoid spicy foods Do not eat 2 hours prior to bedtime - Dexlansoprazole (DEXILANT) 30 MG capsule; TAKE 1 CAPSULE BY MOUTH DAILY FOR 30 DAYS.  Dispense: 90 capsule; Refill: 1  4. Primary insomnia Bedtime routine  5. Recurrent major depressive disorder, in full remission ([name]) Stress management - citalopram (CELEXA) 40 MG tablet; TAKE 1 TABLET BY MOUTH EVERY DAY  Dispense: 30 tablet; Refill: 5  6. GAD (generalized anxiety disorder) - LORazepam (ATIVAN) 1 MG tablet; Take 1 tablet (1 mg total) by mouth every 8 (eight) hours as needed.  Dispense: 30 tablet; Refill: 5  7. BMI 33.0-33.9,adult  Discussed diet and exercise for person with BMI >25 Will recheck weight in 3-6 months  8. Plantar fasciitis, bilateral Rest Wear good shoes- crocks are best for heel pain - naproxen (NAPROSYN) 500 MG tablet; Take 1 tablet (500 mg total) by mouth 2 (two) times daily with a meal.  Dispense: 60 tablet; Refill: 1   Labs pending Health Maintenance reviewed Diet and exercise encouraged  Follow up plan: 6 months   [name] Done, FNP

## 2018-08-26 NOTE — Patient Instructions (Signed)
Plantar Fasciitis      Plantar fasciitis is a painful foot condition that affects the heel. It occurs when the band of tissue that connects the toes to the heel bone (plantar fascia) becomes irritated. This can happen as the result of exercising too much or doing other repetitive activities (overuse injury).  The pain from plantar fasciitis can range from mild irritation to severe pain that makes it difficult to walk or move. The pain is usually worse in the morning after sleeping, or after sitting or lying down for a while. Pain may also be worse after long periods of walking or standing.  What are the causes?  This condition may be caused by:  Standing for long periods of time.  Wearing shoes that do not have good arch support.  Doing activities that put stress on joints (high-impact activities), including running, aerobics, and ballet.  Being overweight.  An abnormal way of walking (gait).  Tight muscles in the back of your lower leg (calf).  High arches in your feet.  Starting a new athletic activity.  What are the signs or symptoms?  The main symptom of this condition is heel pain. Pain may:  Be worse with first steps after a time of rest, especially in the morning after sleeping or after you have been sitting or lying down for a while.  Be worse after long periods of standing still.  Decrease after 30-45 minutes of activity, such as gentle walking.  How is this diagnosed?  This condition may be diagnosed based on your medical history and your symptoms. Your health care provider may ask questions about your activity level. Your health care provider will do a physical exam to check for:  A tender area on the bottom of your foot.  A high arch in your foot.  Pain when you move your foot.  Difficulty moving your foot.  You may have imaging tests to confirm the diagnosis, such as:  X-rays.  Ultrasound.  MRI.  How is this treated?  Treatment for plantar fasciitis depends on how severe your condition is.  Treatment may include:  Rest, ice, applying pressure (compression), and raising the affected foot (elevation). This may be called RICE therapy. Your health care provider may recommend RICE therapy along with over-the-counter pain medicines to manage your pain.  Exercises to stretch your calves and your plantar fascia.  A splint that holds your foot in a stretched, upward position while you sleep (night splint).  Physical therapy to relieve symptoms and prevent problems in the future.  Injections of steroid medicine (cortisone) to relieve pain and inflammation.  Stimulating your plantar fascia with electrical impulses (extracorporeal shock wave therapy). This is usually the last treatment option before surgery.  Surgery, if other treatments have not worked after 12 months.  Follow these instructions at home:      Managing pain, stiffness, and swelling  If directed, put ice on the painful area:  Put ice in a plastic bag, or use a frozen bottle of water.  Place a towel between your skin and the bag or bottle.  Roll the bottom of your foot over the bag or bottle.  Do this for 20 minutes, 2-3 times a day.  Wear athletic shoes that have air-sole or gel-sole cushions, or try wearing soft shoe inserts that are designed for plantar fasciitis.  Raise (elevate) your foot above the level of your heart while you are sitting or lying down.  Activity  Avoid activities that cause pain.   Ask your health care provider what activities are safe for you.  Do physical therapy exercises and stretches as told by your health care provider.  Try activities and forms of exercise that are easier on your joints (low-impact). Examples include swimming, water aerobics, and biking.  General instructions  Take over-the-counter and prescription medicines only as told by your health care provider.  Wear a night splint while sleeping, if told by your health care provider. Loosen the splint if your toes tingle, become numb, or turn cold and  blue.  Maintain a healthy weight, or work with your health care provider to lose weight as needed.  Keep all follow-up visits as told by your health care provider. This is important.  Contact a health care provider if you:  Have symptoms that do not go away after caring for yourself at home.  Have pain that gets worse.  Have pain that affects your ability to move or do your daily activities.  Summary  Plantar fasciitis is a painful foot condition that affects the heel. It occurs when the band of tissue that connects the toes to the heel bone (plantar fascia) becomes irritated.  The main symptom of this condition is heel pain that may be worse after exercising too much or standing still for a long time.  Treatment varies, but it usually starts with rest, ice, compression, and elevation (RICE therapy) and over-the-counter medicines to manage pain.  This information is not intended to replace advice given to you by your health care provider. Make sure you discuss any questions you have with your health care provider.  Document Released: 11/15/2000 Document Revised: 12/18/2016 Document Reviewed: 12/18/2016  Elsevier Interactive Patient Education © 2019 [employer] Inc[employer]

## 2018-08-27 LAB — LIPID PANEL
Chol/HDL Ratio: 4.6 ratio — ABNORMAL HIGH (ref 0.0–4.4)
Cholesterol, Total: 256 mg/dL — ABNORMAL HIGH (ref 100–199)
HDL: 56 mg/dL (ref >39)
LDL Calculated: 172 mg/dL — ABNORMAL HIGH (ref 0–99)
Triglycerides: 141 mg/dL (ref 0–149)
VLDL Cholesterol Cal: 28 mg/dL (ref 5–40)

## 2018-08-27 LAB — CBC WITH DIFFERENTIAL/PLATELET
Basophils Absolute: 0.1 10*3/uL (ref 0.0–0.2)
Basos: 1 %
EOS (ABSOLUTE): 0.2 10*3/uL (ref 0.0–0.4)
Eos: 4 %
Hematocrit: 39.4 % (ref 34.0–46.6)
Hemoglobin: 13.2 g/dL (ref 11.1–15.9)
Immature Grans (Abs): 0 10*3/uL (ref 0.0–0.1)
Immature Granulocytes: 0 %
Lymphocytes Absolute: 1.5 10*3/uL (ref 0.7–3.1)
Lymphs: 31 %
MCH: 28.9 pg (ref 26.6–33.0)
MCHC: 33.5 g/dL (ref 31.5–35.7)
MCV: 86 fL (ref 79–97)
Monocytes Absolute: 0.3 10*3/uL (ref 0.1–0.9)
Monocytes: 5 %
Neutrophils Absolute: 2.9 10*3/uL (ref 1.4–7.0)
Neutrophils: 59 %
Platelets: 263 10*3/uL (ref 150–450)
RBC: 4.56 x10E6/uL (ref 3.77–5.28)
RDW: 13.4 % (ref 11.7–15.4)
WBC: 4.9 10*3/uL (ref 3.4–10.8)

## 2018-08-27 LAB — CMP14+EGFR
ALT: 21 IU/L (ref 0–32)
AST: 22 IU/L (ref 0–40)
Albumin/Globulin Ratio: 2.1 (ref 1.2–2.2)
Albumin: 4.4 g/dL (ref 3.8–4.9)
Alkaline Phosphatase: 81 IU/L (ref 39–117)
BUN/Creatinine Ratio: 14 (ref 12–28)
BUN: 11 mg/dL (ref 8–27)
Bilirubin Total: 0.3 mg/dL (ref 0.0–1.2)
CO2: 24 mmol/L (ref 20–29)
Calcium: 9.8 mg/dL (ref 8.7–10.3)
Chloride: 101 mmol/L (ref 96–106)
Creatinine, Ser: 0.81 mg/dL (ref 0.57–1.00)
GFR calc Af Amer: 91 mL/min/{1.73_m2} (ref >59)
GFR calc non Af Amer: 79 mL/min/{1.73_m2} (ref >59)
Globulin, Total: 2.1 g/dL (ref 1.5–4.5)
Glucose: 111 mg/dL — ABNORMAL HIGH (ref 65–99)
Potassium: 4.4 mmol/L (ref 3.5–5.2)
Sodium: 140 mmol/L (ref 134–144)
Total Protein: 6.5 g/dL (ref 6.0–8.5)

## 2018-08-27 LAB — THYROID PANEL WITH TSH
Free Thyroxine Index: 1.5 (ref 1.2–4.9)
T3 Uptake Ratio: 24 % (ref 24–39)
T4, Total: 6.3 ug/dL (ref 4.5–12.0)
TSH: 1.61 u[IU]/mL (ref 0.450–4.500)

## 2018-08-31 LAB — IGP, APTIMA HPV, RFX 16/18,45: HPV Aptima: NEGATIVE

## 2018-10-16 ENCOUNTER — Other Ambulatory Visit: Payer: Self-pay | Admitting: Nurse Practitioner

## 2018-10-16 DIAGNOSIS — J4521 Mild intermittent asthma with (acute) exacerbation: Secondary | ICD-10-CM

## 2018-10-23 ENCOUNTER — Other Ambulatory Visit: Payer: Self-pay | Admitting: Nurse Practitioner

## 2018-10-23 DIAGNOSIS — M722 Plantar fascial fibromatosis: Secondary | ICD-10-CM

## 2018-11-10 ENCOUNTER — Other Ambulatory Visit: Payer: Self-pay | Admitting: Nurse Practitioner

## 2018-11-10 DIAGNOSIS — J4521 Mild intermittent asthma with (acute) exacerbation: Secondary | ICD-10-CM

## 2018-11-12 ENCOUNTER — Other Ambulatory Visit: Payer: Self-pay

## 2018-11-12 ENCOUNTER — Ambulatory Visit (INDEPENDENT_AMBULATORY_CARE_PROVIDER_SITE_OTHER): Payer: 59 | Admitting: Family Medicine

## 2018-11-12 DIAGNOSIS — I498 Other specified cardiac arrhythmias: Secondary | ICD-10-CM | POA: Diagnosis not present

## 2018-11-12 DIAGNOSIS — K219 Gastro-esophageal reflux disease without esophagitis: Secondary | ICD-10-CM | POA: Diagnosis not present

## 2018-11-12 MED ORDER — FAMOTIDINE 20 MG PO TABS: 20.0000 mg | ORAL_TABLET | Freq: Every day | ORAL | 1 refills | Status: DC

## 2018-11-12 NOTE — Patient Instructions (Signed)

## 2018-11-12 NOTE — Progress Notes (Signed)
Telephone visit  Subjective: CC: GERD PCP: Chevis Pretty, FNP HPI:Crystal Wong is a 63 y.o. female calls for telephone consult today. Patient provides verbal consent for consult held via phone.  Location of patient: work Location of provider: [facility] Others present for call: none  1. GERD Patient reports longstanding history of GERD.  This was previously well controlled with Dexilant 30 mg daily.  Over the last several days she has been having increasing symptoms and was wondering if she needed to increase her dose versus add something else.  She does not report any nausea, vomiting, blood in stool.  She does report increased stress and heart palpitations that seem to accompany the GERD symptoms.  She has an upcoming surgery and is stressed out about that and work.  She does have a heart murmur.  Denies any change in exercise tolerance, shortness of breath, lower extremity edema or chest pain.   ROS: Per HPI  Allergies  Allergen Reactions  . Sulfa Antibiotics Anaphylaxis  . Sulfamethoxazole-Trimethoprim Swelling  . Sulfur Other (See Comments)    Other reaction(s): Angioedema (ALLERGY/intolerance) Other reaction(s): Angioedema (ALLERGY/intolerance)   Past Medical History:  Diagnosis Date  . Anxiety   . GERD (gastroesophageal reflux disease)   . Hyperlipidemia   . Insomnia     Current Outpatient Medications:  .  beclomethasone (QVAR REDIHALER) 80 MCG/ACT inhaler, Inhale 2 puffs into the lungs 2 (two) times daily., Disp: 10.6 g, Rfl: 3 .  citalopram (CELEXA) 40 MG tablet, TAKE 1 TABLET BY MOUTH EVERY DAY, Disp: 30 tablet, Rfl: 5 .  Dexlansoprazole (DEXILANT) 30 MG capsule, TAKE 1 CAPSULE BY MOUTH DAILY FOR 30 DAYS., Disp: 90 capsule, Rfl: 1 .  fluticasone (FLONASE) 50 MCG/ACT nasal spray, Place 2 sprays into both nostrils daily., Disp: 16 g, Rfl: 6 .  hydrocortisone (ANUSOL-HC) 25 MG suppository, Place 1 suppository (25 mg total) rectally 2 (two) times daily., Disp: 12  suppository, Rfl: 0 .  LORATADINE-D 24HR 10-240 MG 24 hr tablet, Take 1 tablet by mouth once daily, Disp: 90 tablet, Rfl: 1 .  LORazepam (ATIVAN) 1 MG tablet, Take 1 tablet (1 mg total) by mouth every 8 (eight) hours as needed., Disp: 30 tablet, Rfl: 5 .  naproxen (NAPROSYN) 500 MG tablet, TAKE 1 TABLET (500 MG TOTAL) BY MOUTH 2 (TWO) TIMES DAILY WITH A MEAL., Disp: 60 tablet, Rfl: 1 .  rosuvastatin (CRESTOR) 40 MG tablet, Take 1 tablet (40 mg total) by mouth daily., Disp: 90 tablet, Rfl: 1 .  VENTOLIN HFA 108 (90 Base) MCG/ACT inhaler, INHALE 2 PUFFS BY MOUTH EVERY 6 HOURS AS NEEDED FOR WHEEZING, Disp: 18 g, Rfl: 0  Assessment/ Plan: 63 y.o. female   1. Gastroesophageal reflux disease without esophagitis We discussed adding Pepcid nightly.  Continue Dexilant.  If ongoing symptoms, low threshold to have him evaluated by gastroenterology.  Today she noted that she had been previously treated for peptic ulcer disease but was not aware of any actual GI bleed that she is had in the past.  I suspect that both her symptoms of GERD and heart fluttering are likely related to increased stress surrounding upcoming surgery.  She understands red flag signs and symptoms and reasons for reevaluation - famotidine (PEPCID) 20 MG tablet; Take 1 tablet (20 mg total) by mouth at bedtime.  Dispense: 30 tablet; Refill: 1  2. Periodic heart flutter We discussed that this was likely PVCs but that if symptoms were accompanied by other worrisome symptoms or signs she should certainly  be evaluated.  At this time she is demonstrating none of those signs.  She will cut back on caffeine to see if symptoms improve.   Start time: 3:11pm End time: 3:17pm  Total time spent on patient care (including telephone call/ virtual visit): 13 minutes  Friedens, [place] [phone]

## 2018-11-22 ENCOUNTER — Other Ambulatory Visit: Payer: Self-pay

## 2018-11-25 ENCOUNTER — Other Ambulatory Visit: Payer: Self-pay

## 2018-11-25 ENCOUNTER — Ambulatory Visit (INDEPENDENT_AMBULATORY_CARE_PROVIDER_SITE_OTHER): Payer: 59

## 2018-11-25 DIAGNOSIS — Z23 Encounter for immunization: Secondary | ICD-10-CM

## 2018-12-06 ENCOUNTER — Encounter: Payer: Self-pay | Admitting: *Deleted

## 2018-12-14 ENCOUNTER — Other Ambulatory Visit: Payer: Self-pay | Admitting: Family Medicine

## 2018-12-14 DIAGNOSIS — K219 Gastro-esophageal reflux disease without esophagitis: Secondary | ICD-10-CM

## 2019-02-03 ENCOUNTER — Encounter: Payer: Self-pay | Admitting: Family Medicine

## 2019-02-03 ENCOUNTER — Ambulatory Visit (INDEPENDENT_AMBULATORY_CARE_PROVIDER_SITE_OTHER): Payer: 59 | Admitting: Family Medicine

## 2019-02-03 DIAGNOSIS — K2100 Gastro-esophageal reflux disease with esophagitis, without bleeding: Secondary | ICD-10-CM

## 2019-02-03 NOTE — Progress Notes (Signed)
Virtual Visit via telephone Note  I connected with Crystal Wong on 02/03/19 at 1640 by telephone and verified that I am speaking with the correct person using two identifiers. Crystal Wong is currently located at home and no other people are currently with her during visit. The provider, Fransisca Kaufmann Elasha Tess, MD is located in their office at time of visit.  Call ended at 1648  I discussed the limitations, risks, security and privacy concerns of performing an evaluation and management service by telephone and the availability of in person appointments. I also discussed with the patient that there may be a patient responsible charge related to this service. The patient expressed understanding and agreed to proceed.   History and Present Illness: Patient is calling in for GERD and is having a lot of gas and leads to fluttering and belching since thanksgiving. She is taking dexilant in the am and pepcid in the evening.  She has had a lot more issues since the past 2 days. She denies any diarrhea or blood in stool.     No diagnosis found.  Outpatient Encounter Medications as of 02/03/2019  Medication Sig  . beclomethasone (QVAR REDIHALER) 80 MCG/ACT inhaler Inhale 2 puffs into the lungs 2 (two) times daily.  . citalopram (CELEXA) 40 MG tablet TAKE 1 TABLET BY MOUTH EVERY DAY  . Dexlansoprazole (DEXILANT) 30 MG capsule TAKE 1 CAPSULE BY MOUTH DAILY FOR 30 DAYS.  . famotidine (PEPCID) 20 MG tablet TAKE 1 TABLET BY MOUTH EVERYDAY AT BEDTIME  . fluticasone (FLONASE) 50 MCG/ACT nasal spray Place 2 sprays into both nostrils daily.  . hydrocortisone (ANUSOL-HC) 25 MG suppository Place 1 suppository (25 mg total) rectally 2 (two) times daily.  Marland Kitchen LORATADINE-D 24HR 10-240 MG 24 hr tablet Take 1 tablet by mouth once daily  . LORazepam (ATIVAN) 1 MG tablet Take 1 tablet (1 mg total) by mouth every 8 (eight) hours as needed.  . naproxen (NAPROSYN) 500 MG tablet TAKE 1 TABLET (500 MG TOTAL) BY MOUTH 2 (TWO)  TIMES DAILY WITH A MEAL.  . rosuvastatin (CRESTOR) 40 MG tablet Take 1 tablet (40 mg total) by mouth daily.  . VENTOLIN HFA 108 (90 Base) MCG/ACT inhaler INHALE 2 PUFFS BY MOUTH EVERY 6 HOURS AS NEEDED FOR WHEEZING   No facility-administered encounter medications on file as of 02/03/2019.     Review of Systems  Constitutional: Negative for chills and fever.  Eyes: Negative for visual disturbance.  Respiratory: Negative for chest tightness and shortness of breath.   Cardiovascular: Negative for chest pain and leg swelling.  Gastrointestinal: Positive for abdominal pain and nausea. Negative for blood in stool, constipation, diarrhea and vomiting.  Musculoskeletal: Negative for back pain and gait problem.  Skin: Negative for rash.  Neurological: Negative for light-headedness and headaches.  Psychiatric/Behavioral: Negative for agitation and behavioral problems.  All other systems reviewed and are negative.   Observations/Objective: Patient sounds comfortable and in no acute distress  Assessment and Plan: Problem List Items Addressed This Visit      Digestive   GERD (gastroesophageal reflux disease) - Primary       Follow Up Instructions: Patient has worsening GERD since Thanksgiving over the past couple days, recommended to double up on her famotidine while she is having the symptoms until they subside and then returning back to the lower dose    I discussed the assessment and treatment plan with the patient. The patient was provided an opportunity to ask questions and all  were answered. The patient agreed with the plan and demonstrated an understanding of the instructions.   The patient was advised to call back or seek an in-person evaluation if the symptoms worsen or if the condition fails to improve as anticipated.  The above assessment and management plan was discussed with the patient. The patient verbalized understanding of and has agreed to the management plan. Patient is  aware to call the clinic if symptoms persist or worsen. Patient is aware when to return to the clinic for a follow-up visit. Patient educated on when it is appropriate to go to the emergency department.    I provided 8 minutes of non-face-to-face time during this encounter.    Worthy Rancher, MD

## 2019-02-07 ENCOUNTER — Other Ambulatory Visit: Payer: Self-pay | Admitting: Nurse Practitioner

## 2019-02-07 DIAGNOSIS — K219 Gastro-esophageal reflux disease without esophagitis: Secondary | ICD-10-CM

## 2019-02-14 ENCOUNTER — Other Ambulatory Visit: Payer: Self-pay | Admitting: Nurse Practitioner

## 2019-02-17 ENCOUNTER — Other Ambulatory Visit: Payer: Self-pay

## 2019-02-18 ENCOUNTER — Other Ambulatory Visit: Payer: Self-pay | Admitting: Nurse Practitioner

## 2019-02-18 DIAGNOSIS — F411 Generalized anxiety disorder: Secondary | ICD-10-CM

## 2019-02-19 ENCOUNTER — Other Ambulatory Visit: Payer: Self-pay

## 2019-02-20 ENCOUNTER — Ambulatory Visit (INDEPENDENT_AMBULATORY_CARE_PROVIDER_SITE_OTHER): Payer: 59 | Admitting: Nurse Practitioner

## 2019-02-20 ENCOUNTER — Encounter: Payer: Self-pay | Admitting: Nurse Practitioner

## 2019-02-20 VITALS — BP 131/74 | HR 77 | Temp 98.2°F | Resp 20 | Ht 66.0 in | Wt 207.0 lb

## 2019-02-20 DIAGNOSIS — Z6833 Body mass index (BMI) 33.0-33.9, adult: Secondary | ICD-10-CM

## 2019-02-20 DIAGNOSIS — J4541 Moderate persistent asthma with (acute) exacerbation: Secondary | ICD-10-CM

## 2019-02-20 DIAGNOSIS — F5101 Primary insomnia: Secondary | ICD-10-CM

## 2019-02-20 DIAGNOSIS — E782 Mixed hyperlipidemia: Secondary | ICD-10-CM | POA: Diagnosis not present

## 2019-02-20 DIAGNOSIS — K219 Gastro-esophageal reflux disease without esophagitis: Secondary | ICD-10-CM

## 2019-02-20 DIAGNOSIS — K2100 Gastro-esophageal reflux disease with esophagitis, without bleeding: Secondary | ICD-10-CM

## 2019-02-20 DIAGNOSIS — F3342 Major depressive disorder, recurrent, in full remission: Secondary | ICD-10-CM

## 2019-02-20 DIAGNOSIS — F411 Generalized anxiety disorder: Secondary | ICD-10-CM

## 2019-02-20 DIAGNOSIS — R739 Hyperglycemia, unspecified: Secondary | ICD-10-CM

## 2019-02-20 MED ORDER — DEXILANT 30 MG PO CPDR: DELAYED_RELEASE_CAPSULE | ORAL | 1 refills | Status: DC

## 2019-02-20 MED ORDER — LORAZEPAM 1 MG PO TABS: 1.0000 mg | ORAL_TABLET | Freq: Three times a day (TID) | ORAL | 5 refills | Status: DC | PRN

## 2019-02-20 MED ORDER — ROSUVASTATIN CALCIUM 40 MG PO TABS: 40.0000 mg | ORAL_TABLET | Freq: Every day | ORAL | 1 refills | Status: DC

## 2019-02-20 MED ORDER — FAMOTIDINE 20 MG PO TABS: ORAL_TABLET | ORAL | 1 refills | Status: DC

## 2019-02-20 MED ORDER — CITALOPRAM HYDROBROMIDE 40 MG PO TABS: ORAL_TABLET | ORAL | 1 refills | Status: DC

## 2019-02-20 NOTE — Progress Notes (Signed)
Subjective:    Patient ID: Crystal Wong, female    DOB: 1955/03/09, 63 y.o.   MRN: [id number]   Chief Complaint: Medical Management of Chronic Issues    HPI:  1. Mixed hyperlipidemia treis to watch diet nad stays very active but does no dedicated exercise. Lab Results  Component Value Date   CHOL 256 (H) 08/26/2018   HDL 56 08/26/2018   LDLCALC 172 (H) 08/26/2018   TRIG 141 08/26/2018   CHOLHDL 4.6 (H) 08/26/2018     2. Moderate persistent asthma with acute exacerbation She is on qvar daioly and that is working well. She usually has several sinus infections yearly but has done better this  Last year.  3. Gastroesophageal reflux disease with esophagitis without hemorrhage Has history of bad reflux disease. She is currently on dexilant and pepcid daly and still has occasional symptoms weekly.  4. Primary insomnia Has been sleeping well without any problems faling asleep or staying asleep  5. Recurrent major depressive disorder, in full remission [name] has a long history of depression in which she takes celexa for daily. She says she is doing well and has no side effects form medication. Depression screen [facility] 2/9 02/20/2019 08/26/2018 04/29/2018  Decreased Interest 0 0 0  Down, Depressed, Hopeless 0 0 0  PHQ - 2 Score 0 0 0     6. GAD (generalized anxiety disorder) Has ativan but very seldom takes since she retired. GAD 7 : Generalized Anxiety Score 02/20/2019  Nervous, Anxious, on Edge 0  Control/stop worrying 1  Worry too much - different things 0  Trouble relaxing 0  Restless 0  Easily annoyed or irritable 0  Afraid - awful might happen 0  Total GAD 7 Score 1  Anxiety Difficulty Not difficult at all      7. BMI 33.0-33.9,adult No recent weight changes Wt Readings from Last 3 Encounters:  02/20/19 207 lb (93.9 kg)  08/26/18 209 lb (94.8 kg)  05/17/18 204 lb (92.5 kg)    BMI Readings from Last 3 Encounters:  02/20/19 33.41 kg/m  08/26/18 33.73  kg/m  05/17/18 32.93 kg/m      Outpatient Encounter Medications as of 02/20/2019  Medication Sig  . beclomethasone (QVAR REDIHALER) 80 MCG/ACT inhaler Inhale 2 puffs into the lungs 2 (two) times daily.  . citalopram (CELEXA) 40 MG tablet TAKE 1 TABLET BY MOUTH EVERY DAY  . Dexlansoprazole (DEXILANT) 30 MG capsule TAKE 1 CAPSULE BY MOUTH DAILY FOR 30 DAYS.  . famotidine (PEPCID) 20 MG tablet TAKE 1 TABLET BY MOUTH EVERYDAY AT BEDTIME  . fluticasone (FLONASE) 50 MCG/ACT nasal spray Place 2 sprays into both nostrils daily.  . hydrocortisone (ANUSOL-HC) 25 MG suppository Place 1 suppository (25 mg total) rectally 2 (two) times daily.  Marland Kitchen LORATADINE-D 24HR 10-240 MG 24 hr tablet Take 1 tablet by mouth once daily  . LORazepam (ATIVAN) 1 MG tablet Take 1 tablet (1 mg total) by mouth every 8 (eight) hours as needed.  . naproxen (NAPROSYN) 500 MG tablet TAKE 1 TABLET (500 MG TOTAL) BY MOUTH 2 (TWO) TIMES DAILY WITH A MEAL.  . rosuvastatin (CRESTOR) 40 M G tablet Take 1 tablet (40 mg total) by mouth daily.  . VENTOLIN HFA 108 (90 Base) MCG/ACT inhaler INHALE 2 PUFFS BY MOUTH EVERY 6 HOURS AS NEEDED FOR WHEEZING    History reviewed. No pertinent surgical history.  Family History  Problem Relation Age of Onset  . Cancer Father     New  complaints: None today  Social history: Worked at [employer]- has retired  Controlled substance contract: 02/20/19    Review of Systems  Constitutional: Negative for diaphoresis.  Eyes: Negative for pain.  Respiratory: Negative for shortness of breath.   Cardiovascular: Negative for chest pain, palpitations and leg swelling.  Gastrointestinal: Negative for abdominal pain.  Endocrine: Negative for polydipsia.  Skin: Negative for rash.  Neurological: Negative for dizziness, weakness and headaches.  Hematological: Does not bruise/bleed easily.  All other systems reviewed and are negative.      Objective:   Physical Exam Vitals and  nursing note reviewed.  Constitutional:      General: She is not in acute distress.    Appearance: Normal appearance. She is well-developed.  HENT:     Head: Normocephalic.     Nose: Nose normal.  Eyes:     Pupils: Pupils are equal, round, and reactive to light.  Neck:     Vascular: No carotid bruit or JVD.  Cardiovascular:     Rate and Rhythm: Normal rate and regular rhythm.     Heart sounds: Normal heart sounds.  Pulmonary:     Effort: Pulmonary effort is normal. No respiratory distress.     Breath sounds: Normal breath sounds. No wheezing or rales.  Chest:     Chest wall: No tenderness.  Abdominal:     General: Bowel sounds are normal. There is no distension or abdominal bruit.     Palpations: Abdomen is soft. There is no hepatomegaly, splenomegaly, mass or pulsatile mass.     Tenderness: There is no abdominal tenderness.  Musculoskeletal:        General: Normal range of motion.     Cervical back: Normal range of motion and neck supple.  Lymphadenopathy:     Cervical: No cervical adenopathy.  Skin:    General: Skin is warm and dry.  Neurological:     Mental Status: She is alert and oriented to person, place, and time.     Deep Tendon Reflexes: Reflexes are normal and symmetric.  Psychiatric:        Behavior: Behavior normal.        Thought Content: Thought content normal.        Judgment: Judgment normal.    BP 131/74   Pulse 77   Temp 98.2 F (36.8 C) (Temporal)   Resp 20   Ht 5' 6"  (1.676 m)   Wt 207 lb (93.9 kg)   SpO2 97%   BMI 33.41 kg/m         Assessment & Plan:  Crystal Wong comes in today with chief complaint of Medical Management of Chronic Issues   Diagnosis and orders addressed:  1. Mixed hyperlipidemia Low fat diet - rosuvastatin (CRESTOR) 40 MG tablet; Take 1 tablet (40 mg total) by mouth daily.  Dispense: 90 tablet; Refill: 1 - CMP14+EGFR - Lipid panel  2. Moderate persistent asthma with acute exacerbation Continue qvar as rx  3.  Gastroesophageal reflux disease with esophagitis without hemorrhage Avoid spicy foods Do not eat 2 hours prior to bedtime - famotidine (PEPCID) 20 MG tablet; TAKE 1 TABLET BY MOUTH EVERYDAY AT BEDTIME  Dispense: 180 tablet; Refill: 1 - Dexlansoprazole (DEXILANT) 30 MG capsule; TAKE 1 CAPSULE BY MOUTH DAILY FOR 30 DAYS.  Dispense: 90 capsule; Refill: 1  4. Primary insomnia Bedtime routine  5. Recurrent major depressive disorder, in full remission ([place]) Stress management - citalopram (CELEXA) 40 MG tablet; TAKE 1 TABLET BY  MOUTH EVERY DAY  Dispense: 90 tablet; Refill: 1  6. GAD (generalized anxiety disorder) - LORazepam (ATIVAN) 1 MG tablet; Take 1 tablet (1 mg total) by mouth every 8 (eight) hours as needed.  Dispense: 30 tablet; Refill: 5  7. BMI 33.0-33.9,adult Discussed diet and exercise for person with BMI >25 Will recheck weight in 3-6 months    Labs pending Health Maintenance reviewed Diet and exercise encouraged  Follow up plan: 6 months   [name] Done, FNP

## 2019-02-20 NOTE — Patient Instructions (Signed)

## 2019-02-21 LAB — CMP14+EGFR
ALT: 20 IU/L (ref 0–32)
AST: 20 IU/L (ref 0–40)
Albumin/Globulin Ratio: 1.8 (ref 1.2–2.2)
Albumin: 4.2 g/dL (ref 3.8–4.8)
Alkaline Phosphatase: 92 IU/L (ref 39–117)
BUN/Creatinine Ratio: 24 (ref 12–28)
BUN: 18 mg/dL (ref 8–27)
Bilirubin Total: 0.4 mg/dL (ref 0.0–1.2)
CO2: 25 mmol/L (ref 20–29)
Calcium: 10.1 mg/dL (ref 8.7–10.3)
Chloride: 103 mmol/L (ref 96–106)
Creatinine, Ser: 0.75 mg/dL (ref 0.57–1.00)
GFR calc Af Amer: 99 mL/min/{1.73_m2} (ref >59)
GFR calc non Af Amer: 86 mL/min/{1.73_m2} (ref >59)
Globulin, Total: 2.4 g/dL (ref 1.5–4.5)
Glucose: 119 mg/dL — ABNORMAL HIGH (ref 65–99)
Potassium: 4.9 mmol/L (ref 3.5–5.2)
Sodium: 140 mmol/L (ref 134–144)
Total Protein: 6.6 g/dL (ref 6.0–8.5)

## 2019-02-21 LAB — LIPID PANEL
Chol/HDL Ratio: 4.7 ratio — ABNORMAL HIGH (ref 0.0–4.4)
Cholesterol, Total: 219 mg/dL — ABNORMAL HIGH (ref 100–199)
HDL: 47 mg/dL (ref >39)
LDL Chol Calc (NIH): 152 mg/dL — ABNORMAL HIGH (ref 0–99)
Triglycerides: 111 mg/dL (ref 0–149)
VLDL Cholesterol Cal: 20 mg/dL (ref 5–40)

## 2019-02-21 NOTE — Addendum Note (Signed)
Addended by: Chevis Pretty on: 02/21/2019 02:51 PM   Modules accepted: Orders

## 2019-02-24 LAB — BAYER DCA HB A1C WAIVED: HB A1C (BAYER DCA - WAIVED): 6.4 % (ref <7.0)

## 2019-02-25 ENCOUNTER — Ambulatory Visit: Payer: Self-pay | Admitting: Nurse Practitioner

## 2019-03-11 ENCOUNTER — Other Ambulatory Visit: Payer: Self-pay | Admitting: Nurse Practitioner

## 2019-03-11 DIAGNOSIS — J302 Other seasonal allergic rhinitis: Secondary | ICD-10-CM

## 2019-03-14 ENCOUNTER — Other Ambulatory Visit: Payer: Self-pay | Admitting: Nurse Practitioner

## 2019-03-19 ENCOUNTER — Other Ambulatory Visit: Payer: Self-pay | Admitting: Family Medicine

## 2019-03-19 ENCOUNTER — Telehealth: Payer: Self-pay | Admitting: Nurse Practitioner

## 2019-03-19 DIAGNOSIS — B379 Candidiasis, unspecified: Secondary | ICD-10-CM

## 2019-03-19 MED ORDER — FLUCONAZOLE 150 MG PO TABS: 150.0000 mg | ORAL_TABLET | Freq: Once | ORAL | 0 refills | Status: AC

## 2019-03-19 NOTE — Telephone Encounter (Signed)
Patient aware.

## 2019-03-19 NOTE — Telephone Encounter (Signed)
RX sent to pharmacy  

## 2019-03-28 ENCOUNTER — Encounter: Payer: Self-pay | Admitting: Nurse Practitioner

## 2019-03-28 ENCOUNTER — Other Ambulatory Visit: Payer: Self-pay

## 2019-03-28 ENCOUNTER — Ambulatory Visit (INDEPENDENT_AMBULATORY_CARE_PROVIDER_SITE_OTHER): Payer: 59 | Admitting: Nurse Practitioner

## 2019-03-28 VITALS — BP 113/65 | HR 84 | Temp 98.0°F | Resp 20 | Ht 66.0 in | Wt 206.0 lb

## 2019-03-28 DIAGNOSIS — K219 Gastro-esophageal reflux disease without esophagitis: Secondary | ICD-10-CM | POA: Diagnosis not present

## 2019-03-28 MED ORDER — SUCRALFATE 1 G PO TABS: 1.0000 g | ORAL_TABLET | Freq: Three times a day (TID) | ORAL | 1 refills | Status: DC

## 2019-03-28 NOTE — Progress Notes (Signed)
Subjective:    Patient ID: Crystal Wong, female    DOB: 1955-12-23, 64 y.o.   MRN: [id number]   Chief Complaint: Gastroesophageal Reflux (Wants ear checked)   HPI Patient comes in toady c/o GERD. She is on dexilant and 2 pepcid at night. Seems to be worse during the night. Woke her up at 4AM this morning and she was up the rest of the night with it. She has not had an endoscopy in years.   Review of Systems  Constitutional: Negative for diaphoresis.  Eyes: Negative for pain.  Respiratory: Negative for shortness of breath.   Cardiovascular: Negative for chest pain, palpitations and leg swelling.  Gastrointestinal: Positive for diarrhea (mild). Negative for abdominal pain, nausea and vomiting.  Endocrine: Negative for polydipsia.  Skin: Negative for rash.  Neurological: Negative for dizziness, weakness and headaches.  Hematological: Does not bruise/bleed easily.  All other systems reviewed and are negative.      Objective:   Physical Exam Vitals and nursing note reviewed.  Constitutional:      General: She is not in acute distress.    Appearance: Normal appearance. She is well-developed.  HENT:     Head: Normocephalic.     Nose: Nose normal.  Eyes:     Pupils: Pupils are equal, round, and reactive to light.  Neck:     Vascular: No carotid bruit or JVD.  Cardiovascular:     Rate and Rhythm: Normal rate and regular rhythm.     Heart sounds: Normal heart sounds.  Pulmonary:     Effort: Pulmonary effort is normal. No respiratory distress.     Breath sounds: Normal breath sounds. No wheezing or rales.  Chest:     Chest wall: No tenderness.  Abdominal:     General: Bowel sounds are normal. There is no distension or abdominal bruit.     Palpations: Abdomen is soft. There is no hepatomegaly, splenomegaly, mass or pulsatile mass.     Tenderness: There is no abdominal tenderness.  Musculoskeletal:        General: Normal range of motion.     Cervical back: Normal range of  motion and neck supple.  Lymphadenopathy:     Cervical: No cervical adenopathy.  Skin:    General: Skin is warm and dry.  Neurological:     Mental Status: She is alert and oriented to person, place, and time.     Deep Tendon Reflexes: Reflexes are normal and symmetric.  Psychiatric:        Behavior: Behavior normal.        Thought Content: Thought content normal.        Judgment: Judgment normal.   BP 113/65   Pulse 84   Temp 98 F (36.7 C) (Temporal)   Resp 20   Ht 5\' 6"  (1.676 m)   Wt 206 lb (93.4 kg)   SpO2 97%   BMI 33.25 kg/m         Assessment & Plan:  Crystal Wong in today with chief complaint of Gastroesophageal Reflux (Wants ear checked)   1. Gastroesophageal reflux disease without esophagitis Avoid spicy foods Do not eat 2 hours prior to bedtime Continue dexilant - Ambulatory referral to Gastroenterology    The above assessment and management plan was discussed with the patient. The patient verbalized understanding of and has agreed to the management plan. Patient is aware to call the clinic if symptoms persist or worsen. Patient is aware when to return to the clinic  for a follow-up visit. Patient educated on when it is appropriate to go to the emergency department.   [name] Done, FNP

## 2019-03-28 NOTE — Patient Instructions (Signed)

## 2019-03-28 NOTE — Addendum Note (Signed)
Addended by: Chevis Pretty on: 03/28/2019 03:53 PM   Modules accepted: Orders

## 2019-03-31 ENCOUNTER — Encounter: Payer: Self-pay | Admitting: Gastroenterology

## 2019-04-07 ENCOUNTER — Other Ambulatory Visit: Payer: Self-pay | Admitting: Family

## 2019-04-16 ENCOUNTER — Encounter: Payer: Self-pay | Admitting: Family Medicine

## 2019-04-16 ENCOUNTER — Ambulatory Visit: Payer: 59 | Admitting: Family Medicine

## 2019-04-16 ENCOUNTER — Other Ambulatory Visit: Payer: Self-pay

## 2019-04-16 VITALS — BP 121/79 | HR 78 | Temp 97.7°F | Ht 66.0 in | Wt 202.0 lb

## 2019-04-16 DIAGNOSIS — N898 Other specified noninflammatory disorders of vagina: Secondary | ICD-10-CM | POA: Diagnosis not present

## 2019-04-16 DIAGNOSIS — B3731 Acute candidiasis of vulva and vagina: Secondary | ICD-10-CM

## 2019-04-16 DIAGNOSIS — B373 Candidiasis of vulva and vagina: Secondary | ICD-10-CM | POA: Diagnosis not present

## 2019-04-16 LAB — WET PREP FOR TRICH, YEAST, CLUE
Clue Cell Exam: NEGATIVE
Trichomonas Exam: NEGATIVE
Yeast Exam: POSITIVE — AB

## 2019-04-16 MED ORDER — FLUCONAZOLE 150 MG PO TABS: 150.0000 mg | ORAL_TABLET | Freq: Once | ORAL | 0 refills | Status: AC

## 2019-04-16 NOTE — Progress Notes (Signed)
Assessment & Plan:  1. Vaginal yeast infection - Education provided on yeast infections.  - fluconazole (DIFLUCAN) 150 MG tablet; Take 1 tablet (150 mg total) by mouth once for 1 dose. May repeat after 3 days if needed.  Dispense: 2 tablet; Refill: 0  2. Vaginal itching - WET PREP FOR TRICH, YEAST, CLUE: positive for yeast   Follow up plan: No follow-ups on file.  Hendricks Limes, MSN, APRN, FNP-C [facility]  Subjective:   Patient ID: Crystal Wong, female    DOB: 1955-05-02, 64 y.o.   MRN: [id number]  HPI: Crystal Wong is a 64 y.o. female presenting on 04/16/2019 for Vaginal Discharge (with sorness been going on for 4 days.)  Vaginitis: Patient complains of an abnormal vaginal discharge for 4 days. Vulvar symptoms include soreness. STI Risk: Very low risk of STD exposure.  Discharge described as: white and thick. Patient reports this is how all other yeast infections have presented for her.    ROS: Negative unless specifically indicated above in HPI.   Relevant past medical history reviewed and updated as indicated.   Allergies and medications reviewed and updated.   Current Outpatient Medications:  .  beclomethasone (QVAR REDIHALER) 80 MCG/ACT inhaler, Inhale 2 puffs into the lungs 2 (two) times daily., Disp: 10.6 g, Rfl: 3 .  citalopram (CELEXA) 40 MG tablet, TAKE 1 TABLET BY MOUTH EVERY DAY, Disp: 90 tablet, Rfl: 1 .  Dexlansoprazole (DEXILANT) 30 MG capsule, TAKE 1 CAPSULE BY MOUTH DAILY FOR 30 DAYS., Disp: 90 capsule, Rfl: 1 .  famotidine (PEPCID) 20 MG tablet, TAKE 1 TABLET BY MOUTH EVERYDAY AT BEDTIME, Disp: 180 tablet, Rfl: 1 .  fluticasone (FLONASE) 50 MCG/ACT nasal spray, SPRAY 2 SPRAYS INTO EACH NOSTRIL EVERY DAY, Disp: 48 mL, Rfl: 1 .  hydrocortisone (ANUSOL-HC) 25 MG suppository, Place 1 suppository (25 mg total) rectally 2 (two) times daily., Disp: 12 suppository, Rfl: 0 .  LORATADINE-D 24HR 10-240 MG 24 hr tablet, Take 1 tablet by mouth  once daily, Disp: 90 tablet, Rfl: 0 .  LORazepam (ATIVAN) 1 MG tablet, Take 1 tablet (1 mg total) by mouth every 8 (eight) hours as needed., Disp: 30 tablet, Rfl: 5 .  nystatin (MYCOSTATIN) 100000 UNIT/ML suspension, TAKE 5 MLS (500,000 UNITS TOTAL) BY MOUTH 4 (FOUR) TIMES DAILY., Disp: 473 mL, Rfl: 0 .  rosuvastatin (CRESTOR) 40 MG tablet, Take 1 tablet (40 mg total) by mouth daily., Disp: 90 tablet, Rfl: 1 .  sucralfate (CARAFATE) 1 g tablet, Take 1 tablet (1 g total) by mouth 4 (four) times daily -  with meals and at bedtime., Disp: 90 tablet, Rfl: 1 .  VENTOLIN HFA 108 (90 Base) MCG/ACT inhaler, INHALE 2 PUFFS BY MOUTH EVERY 6 HOURS AS NEEDED FOR WHEEZING, Disp: 18 g, Rfl: 0 .  naproxen (NAPROSYN) 500 MG tablet, TAKE 1 TABLET (500 MG TOTAL) BY MOUTH 2 (TWO) TIMES DAILY WITH A MEAL. (Patient not taking: Reported on 04/16/2019), Disp: 60 tablet, Rfl: 1  Allergies  Allergen Reactions  . Sulfa Antibiotics Anaphylaxis  . Sulfamethoxazole-Trimethoprim Swelling  . Sulfur Other (See Comments)    Other reaction(s): Angioedema (ALLERGY/intolerance) Other reaction(s): Angioedema (ALLERGY/intolerance)    Objective:   BP 121/79   Pulse 78   Temp 97.7 F (36.5 C)   Ht 5\' 6"  (1.676 m)   Wt 202 lb (91.6 kg)   SpO2 100%   BMI 32.60 kg/m    Physical Exam Vitals reviewed.  Constitutional:  General: She is not in acute distress.    Appearance: Normal appearance. She is obese. She is not ill-appearing, toxic-appearing or diaphoretic.  HENT:     Head: Normocephalic and atraumatic.  Eyes:     General: No scleral icterus.       Right eye: No discharge.        Left eye: No discharge.     Conjunctiva/sclera: Conjunctivae normal.  Cardiovascular:     Rate and Rhythm: Normal rate.  Pulmonary:     Effort: Pulmonary effort is normal. No respiratory distress.  Musculoskeletal:        General: Normal range of motion.     Cervical back: Normal range of motion.  Skin:    General: Skin is warm  and dry.     Capillary Refill: Capillary refill takes less than 2 seconds.  Neurological:     General: No focal deficit present.     Mental Status: She is alert and oriented to person, place, and time. Mental status is at baseline.  Psychiatric:        Mood and Affect: Mood normal.        Behavior: Behavior normal.        Thought Content: Thought content normal.        Judgment: Judgment normal.

## 2019-04-16 NOTE — Patient Instructions (Signed)
Vaginal Yeast Infection, Adult  Vaginal yeast infection is a condition that causes vaginal discharge as well as soreness, swelling, and redness (inflammation) of the vagina. This is a common condition. Some women get this infection frequently. What are the causes? This condition is caused by a change in the normal balance of the yeast (candida) and bacteria that live in the vagina. This change causes an overgrowth of yeast, which causes the inflammation. What increases the risk? The condition is more likely to develop in women who:  Take antibiotic medicines.  Have diabetes.  Take birth control pills.  Are pregnant.  Douche often.  Have a weak body defense system (immune system).  Have been taking steroid medicines for a long time.  Frequently wear tight clothing. What are the signs or symptoms? Symptoms of this condition include:  White, thick, creamy vaginal discharge.  Swelling, itching, redness, and irritation of the vagina. The lips of the vagina (vulva) may be affected as well.  Pain or a burning feeling while urinating.  Pain during sex. How is this diagnosed? This condition is diagnosed based on:  Your medical history.  A physical exam.  A pelvic exam. Your health care provider will examine a sample of your vaginal discharge under a microscope. Your health care provider may send this sample for testing to confirm the diagnosis. How is this treated? This condition is treated with medicine. Medicines may be over-the-counter or prescription. You may be told to use one or more of the following:  Medicine that is taken by mouth (orally).  Medicine that is applied as a cream (topically).  Medicine that is inserted directly into the vagina (suppository). Follow these instructions at home:  Lifestyle  Do not have sex until your health care provider approves. Tell your sex partner that you have a yeast infection. That person should go to his or her health care  provider and ask if they should also be treated.  Do not wear tight clothes, such as pantyhose or tight pants.  Wear breathable cotton underwear. General instructions  Take or apply over-the-counter and prescription medicines only as told by your health care provider.  Eat more yogurt. This may help to keep your yeast infection from returning.  Do not use tampons until your health care provider approves.  Try taking a sitz bath to help with discomfort. This is a warm water bath that is taken while you are sitting down. The water should only come up to your hips and should cover your buttocks. Do this 3-4 times per day or as told by your health care provider.  Do not douche.  If you have diabetes, keep your blood sugar levels under control.  Keep all follow-up visits as told by your health care provider. This is important. Contact a health care provider if:  You have a fever.  Your symptoms go away and then return.  Your symptoms do not get better with treatment.  Your symptoms get worse.  You have new symptoms.  You develop blisters in or around your vagina.  You have blood coming from your vagina and it is not your menstrual period.  You develop pain in your abdomen. Summary  Vaginal yeast infection is a condition that causes discharge as well as soreness, swelling, and redness (inflammation) of the vagina.  This condition is treated with medicine. Medicines may be over-the-counter or prescription.  Take or apply over-the-counter and prescription medicines only as told by your health care provider.  Do not douche.   Do not have sex or use tampons until your health care provider approves.  Contact a health care provider if your symptoms do not get better with treatment or your symptoms go away and then return. This information is not intended to replace advice given to you by your health care provider. Make sure you discuss any questions you have with your health care  provider. Document Revised: 09/20/2018 Document Reviewed: 07/09/2017 Elsevier Patient Education  2020 [employer]  

## 2019-04-23 ENCOUNTER — Ambulatory Visit: Payer: 59 | Admitting: Gastroenterology

## 2019-04-23 ENCOUNTER — Other Ambulatory Visit: Payer: Self-pay

## 2019-04-23 ENCOUNTER — Encounter: Payer: Self-pay | Admitting: Gastroenterology

## 2019-04-23 VITALS — BP 104/60 | HR 88 | Temp 98.7°F | Ht 65.0 in | Wt 201.4 lb

## 2019-04-23 DIAGNOSIS — K219 Gastro-esophageal reflux disease without esophagitis: Secondary | ICD-10-CM

## 2019-04-23 DIAGNOSIS — R0789 Other chest pain: Secondary | ICD-10-CM

## 2019-04-23 MED ORDER — FAMOTIDINE 20 MG PO TABS: 20.0000 mg | ORAL_TABLET | Freq: Two times a day (BID) | ORAL | 3 refills | Status: DC

## 2019-04-23 MED ORDER — DEXLANSOPRAZOLE 30 MG PO CPDR: 30.0000 mg | DELAYED_RELEASE_CAPSULE | Freq: Two times a day (BID) | ORAL | 3 refills | Status: DC

## 2019-04-23 NOTE — Progress Notes (Signed)
Referring Provider: Chevis Pretty, * Primary Care Physician:  Chevis Pretty, FNP  Reason for Consultation: Reflux, esophageal spasms, abdominal pain, and constipation   IMPRESSION:  Chest/esophagus/abdominal spasms - ? Esophageal in nature Reflux without dysphagia    - prior EGD with Dr. Roney Mans 2017    - no biopsies were obtained at the time of endoscopy Cholelithiasis Chronic constipation Colonoscopy with Dr. Roney Mans 2017 Family history of colon cancer (father in his 34s)  EGD recommended to evaluate for esophagitis including reflux and eosinophilic esophagitis. Will work to [employer], dietary, and lifestyle treatment for reflux.  Does not sound like symptomatic gallstones. If endoscopic evaluation is negative and symptoms persist despite maximum therapy, will plan 24 pH probe, impedence testing, and esophageal manometry.   Given the potential risk for heart disease, will ask for cardiac clearance prior to monitored anesthesia for endoscopy.  PLAN: Obtain colonoscopy and EGD reports from Dr. Roney Mans from 2017 Increase Dexilant to 30 mg BID Increase famotidine 20 mg BID Reviewed lifestyle modifications including working to maintain a health weight Cardiology clearance prior to EGD with esophageal biopsies  The nature of the procedure, as well as the risks, benefits, and alternatives were carefully and thoroughly reviewed with the patient. Ample time for discussion and questions allowed. The patient understood, was satisfied, and agreed to proceed.  Please see the "Patient Instructions" section for addition details about the plan.  HPI: Crystal Wong is a 64 y.o. female referred by NP Hassell Done for further evaluation of reflux.  History is obtained to the patient and review of her electronic health record. Retired Academic librarian. She has anxiety, asthma, depression, reflux, hypercholesterolemia, obesity, and prior hysterectomy. Previously evaluated by Dr. Roney Mans  in 2017.  No interval GI care since that time.  Several years of GERD.  Symptoms consist of intermittent postprandial pyrosis with associated dysphonia and throat discomfort.  Records show occasional dysphagia to solids and intermittent epigastric pain.    She is recently having acute, intermittent "attacks" described as chest and/or stomach spasms.  Episodes are occurring more frequently and now occur most days and often multiple times a day.  Frequent brash and nocturnal symptoms.   No dysphagia or odynophagia. No dysphonia. No sore throat or neck pain.   Had a telehealth visit. Told to increase the famotidine to 2 pills QHS and to continue her Dexilant 30 mg QAM. Carafate 1 g QID was also added, but the patient finds the dosing schedule cumbersome.  Was having significant gas at that time. Changed her diet to use protein shakes with fruit in the morning, cut back on caffeine, eating less processed foods with smaller portions. Working out on an Teacher, adult education. Has lost 8 pounds. Sleeping on two big pillows. She is wondering why she's still having trouble.  Rare alcohol, will drink wine. No tobacco.  No illicit street drugs.  Drinks 1 caffeinated beverage daily.  Has had constipation since high school despite drinking a lot of water. NP Hassell Done has recommended Miralax daily.  She asked the NP about cardiac etiologies to her symptoms but no cardiac evaluation occurred.    She also has a history of asymptomatic cholelithiasis.  EGD for dysphagia and colonoscopy for colon cancer screening in 2017 through Dr. Roney Mans with [facility]. The results are not available in [place].  Father had colon cancer in his 69s.  No known family history of colon cancer or polyps. No family history of uterine/endometrial cancer, pancreatic cancer or gastric/stomach cancer.  Past Medical History:  Diagnosis Date  . Anxiety   . Asthma   . Depression   . GERD (gastroesophageal reflux disease)   .  Hyperlipidemia   . Insomnia     Past Surgical History:  Procedure Laterality Date  . ANKLE FRACTURE SURGERY Left    Pins and screws  . HAND SURGERY Right   . KNEE ARTHROSCOPY Bilateral   . PARTIAL HYSTERECTOMY      Current Outpatient Medications  Medication Sig Dispense Refill  . beclomethasone (QVAR REDIHALER) 80 MCG/ACT inhaler Inhale 2 puffs into the lungs 2 (two) times daily. 10.6 g 3  . citalopram (CELEXA) 40 MG tablet TAKE 1 TABLET BY MOUTH EVERY DAY 90 tablet 1  . Dexlansoprazole (DEXILANT) 30 MG capsule TAKE 1 CAPSULE BY MOUTH DAILY FOR 30 DAYS. 90 capsule 1  . famotidine (PEPCID) 20 MG tablet TAKE 1 TABLET BY MOUTH EVERYDAY AT BEDTIME 180 tablet 1  . fluticasone (FLONASE) 50 MCG/ACT nasal spray SPRAY 2 SPRAYS INTO EACH NOSTRIL EVERY DAY 48 mL 1  . hydrocortisone (ANUSOL-HC) 25 MG suppository Place 1 suppository (25 mg total) rectally 2 (two) times daily. 12 suppository 0  . LORATADINE-D 24HR 10-240 MG 24 hr tablet Take 1 tablet by mouth once daily 90 tablet 0  . LORazepam (ATIVAN) 1 MG tablet Take 1 tablet (1 mg total) by mouth every 8 (eight) hours as needed. 30 tablet 5  . nystatin (MYCOSTATIN) 100000 UNIT/ML suspension TAKE 5 MLS (500,000 UNITS TOTAL) BY MOUTH 4 (FOUR) TIMES DAILY. 473 mL 0  . polyethylene glycol powder (GLYCOLAX/MIRALAX) 17 GM/SCOOP powder Take 17 g by mouth at bedtime.    . rosuvastatin (CRESTOR) 40 MG tablet Take 1 tablet (40 mg total) by mouth daily. 90 tablet 1  . sucralfate (CARAFATE) 1 g tablet Take 1 tablet (1 g total) by mouth 4 (four) times daily -  with meals and at bedtime. 90 tablet 1  . VENTOLIN HFA 108 (90 Base) MCG/ACT inhaler INHALE 2 PUFFS BY MOUTH EVERY 6 HOURS AS NEEDED FOR WHEEZING 18 g 0   No current facility-administered medications for this visit.    Allergies as of 04/23/2019 - Review Complete 04/23/2019  Allergen Reaction Noted  . Sulfa antibiotics Anaphylaxis 12/19/2012  . Sulfamethoxazole-trimethoprim Swelling 03/24/2014  .  Sulfur Other (See Comments) 05/12/2015    Family History  Problem Relation Age of Onset  . Colon cancer Father 28  . Heart disease Mother   . Diabetes Mother   . Colon polyps Mother   . Gallstones Paternal Grandmother   . Gallbladder disease Daughter     Social History   Socioeconomic History  . Marital status: Married    Spouse name: Not on file  . Number of children: 2  . Years of education: Not on file  . Highest education level: Not on file  Occupational History  . Occupation: retired  Tobacco Use  . Smoking status: Never Smoker  . Smokeless tobacco: Never Used  Substance and Sexual Activity  . Alcohol use: Yes    Comment: occasional wine  . Drug use: No  . Sexual activity: Not on file  Other Topics Concern  . Not on file  Social History Narrative  . Not on file   Social Determinants of Health   Financial Resource Strain:   . Difficulty of Paying Living Expenses: Not on file  Food Insecurity:   . Worried About Charity fundraiser in the Last Year: Not on file  . Ran  Out of Food in the Last Year: Not on file  Transportation Needs:   . Lack of Transportation (Medical): Not on file  . Lack of Transportation (Non-Medical): Not on file  Physical Activity:   . Days of Exercise per Week: Not on file  . Minutes of Exercise per Session: Not on file  Stress:   . Feeling of Stress : Not on file  Social Connections:   . Frequency of Communication with Friends and Family: Not on file  . Frequency of Social Gatherings with Friends and Family: Not on file  . Attends Religious Services: Not on file  . Active Member of Clubs or Organizations: Not on file  . Attends Archivist Meetings: Not on file  . Marital Status: Not on file  Intimate Partner Violence:   . Fear of Current or Ex-Partner: Not on file  . Emotionally Abused: Not on file  . Physically Abused: Not on file  . Sexually Abused: Not on file    Review of Systems: 12 system ROS is negative except  as noted above with the addition of allergies, anxiety, depression, and heart murmur.   Physical Exam: General:   Alert,  well-nourished, pleasant and cooperative in NAD Head:  Normocephalic and atraumatic. Eyes:  Sclera clear, no icterus.   Conjunctiva pink. Ears:  Normal auditory acuity. Nose:  No deformity, discharge,  or lesions. Mouth:  No deformity or lesions.   Neck:  Supple; no masses or thyromegaly. Lungs:  Clear throughout to auscultation.   No wheezes. Heart:  Regular rate and rhythm; no murmurs. Abdomen:  Soft,nontender, nondistended, normal bowel sounds, no rebound or guarding. No hepatosplenomegaly.   Rectal:  Deferred  Msk:  Symmetrical. No boney deformities LAD: No inguinal or umbilical LAD Extremities:  No clubbing or edema. Neurologic:  Alert and  oriented x4;  grossly nonfocal Skin:  Intact without significant lesions or rashes. Psych:  Alert and cooperative. Normal mood and affect.      Kada Friesen L. Tarri Glenn, MD, MPH 04/23/2019, 10:07 AM

## 2019-04-23 NOTE — Patient Instructions (Addendum)
Your recent chest spasms may be esophagitis and/or esophageal motility.    I recommend that you increase your Dexilant to 30 mg twice daily and your famotidine to 20 mg twice daily.  You may continue to use the Carafate as needed for any breakthrough symptoms.  I recommend raising the head of your bed. A wedge pillow or raising the headboard should be more helpful that two pillows.   Congratulations on your weight loss. I think your GI symptoms will improve as you lose weight.   I am recommending an upper endoscopy with esophageal biopsies. We will need to have cardiac clearance prior to the procedure for your safety. I will touch base with [name] Done about the best way to obtain that clearance.

## 2019-04-25 ENCOUNTER — Telehealth: Payer: Self-pay | Admitting: Gastroenterology

## 2019-04-25 ENCOUNTER — Ambulatory Visit: Payer: Self-pay | Admitting: Gastroenterology

## 2019-04-25 NOTE — Telephone Encounter (Signed)
[facility] called said the Dexlansoprazole was not covered and\or approved by insurance for twice a day. She said they will cover once a day or they also have a that's 85ml.

## 2019-04-25 NOTE — Telephone Encounter (Signed)
Patients insurance will not cover dexilant capsule twice a day but will cover 60 ml liquid. Ok to switch to liquid?

## 2019-04-25 NOTE — Telephone Encounter (Signed)
Yes. Okay to switch. Thanks.

## 2019-04-25 NOTE — Telephone Encounter (Signed)
Spoke to pharmacy and they have both capsules and liquid 60 mg/ml. They said that the capsules would be covered. Left message for patient that she can pick up a new prescription.

## 2019-05-07 ENCOUNTER — Telehealth: Payer: Self-pay | Admitting: Gastroenterology

## 2019-05-07 DIAGNOSIS — K219 Gastro-esophageal reflux disease without esophagitis: Secondary | ICD-10-CM

## 2019-05-07 DIAGNOSIS — R0789 Other chest pain: Secondary | ICD-10-CM

## 2019-05-07 NOTE — Telephone Encounter (Signed)
Pt stated that she was expecting a referral to cardiology but has not heard anything from them.

## 2019-05-07 NOTE — Telephone Encounter (Signed)
Patient needs cardiology clearance for EGD but states she does not have a cardiologist. Ok to refer her?

## 2019-05-07 NOTE — Telephone Encounter (Signed)
I am not sure how the delay in referral occurred. Please refer to Cardiology now. Thank you.

## 2019-05-08 ENCOUNTER — Encounter: Payer: Self-pay | Admitting: Nurse Practitioner

## 2019-05-08 ENCOUNTER — Telehealth (INDEPENDENT_AMBULATORY_CARE_PROVIDER_SITE_OTHER): Payer: 59 | Admitting: Nurse Practitioner

## 2019-05-08 DIAGNOSIS — B373 Candidiasis of vulva and vagina: Secondary | ICD-10-CM | POA: Diagnosis not present

## 2019-05-08 DIAGNOSIS — K2100 Gastro-esophageal reflux disease with esophagitis, without bleeding: Secondary | ICD-10-CM

## 2019-05-08 DIAGNOSIS — B3731 Acute candidiasis of vulva and vagina: Secondary | ICD-10-CM

## 2019-05-08 MED ORDER — FLUCONAZOLE 150 MG PO TABS: ORAL_TABLET | ORAL | 0 refills | Status: DC

## 2019-05-08 NOTE — Progress Notes (Signed)
Virtual Visit via video Note   Due to COVID-19 pandemic this visit was conducted virtually. This visit type was conducted due to national recommendations for restrictions regarding the COVID-19 Pandemic (e.g. social distancing, sheltering in place) in an effort to limit this patient's exposure and mitigate transmission in our community. All issues noted in this document were discussed and addressed.  A physical exam was not performed with this format.  I connected with Crystal Wong on 05/08/19 at 11:30 by video and verified that I am speaking with the correct person using two identifiers. Crystal Wong is currently located at work and no one  is currently with her during visit. The provider, [name] Done, FNP is located in their office at time of visit.  I discussed the limitations, risks, security and privacy concerns of performing an evaluation and management service by telephone and the availability of in person appointments. I also discussed with the patient that there may be a patient responsible charge related to this service. The patient expressed understanding and agreed to proceed.   History and Present Illness:   Chief Complaint: Vaginitis and Gastroesophageal Reflux   HPI  -vaginal discharge- patient had visit with B. Joyce,FNP on 04/16/19 and was dx with yeast infectin. She was given monistat and s not better. Still has discharge and itching. deneis foul smell - GERD- patient saw gastroenerology and they want to do endoscopy. They want her to see cardiology before they do procedure. Patient is having spasms in chest that are probably GI related , but want to rule out heart issues first.    Review of Systems  Constitutional: Negative for diaphoresis and weight loss.  Eyes: Negative for blurred vision, double vision and pain.  Respiratory: Negative for shortness of breath.   Cardiovascular: Negative for chest pain, palpitations, orthopnea and leg swelling.    Gastrointestinal: Negative for abdominal pain.  Skin: Negative for rash.  Neurological: Negative for dizziness, sensory change, loss of consciousness, weakness and headaches.  Endo/Heme/Allergies: Negative for polydipsia. Does not bruise/bleed easily.  Psychiatric/Behavioral: Negative for memory loss. The patient does not have insomnia.   All other systems reviewed and are negative.      Observations/Objective: Alert and oriented- answers all questions appropriately No distress No SOB noted during visit   Assessment and Plan: Crystal Wong in today with chief complaint of Vaginitis and Gastroesophageal Reflux   1. Vaginal yeast infection Avoid bubble baths - fluconazole (DIFLUCAN) 150 MG tablet; 1 po q week x 4 weeks  Dispense: 4 tablet; Refill: 0  2. Gastroesophageal reflux disease with esophagitis without hemorrhage Avoid spicy foods Do not eat 2 hours prior to bedtime - Ambulatory referral to Cardiology    Follow Up Instructions: prn    I discussed the assessment and treatment plan with the patient. The patient was provided an opportunity to ask questions and all were answered. The patient agreed with the plan and demonstrated an understanding of the instructions.   The patient was advised to call back or seek an in-person evaluation if the symptoms worsen or if the condition fails to improve as anticipated.  The above assessment and management plan was discussed with the patient. The patient verbalized understanding of and has agreed to the management plan. Patient is aware to call the clinic if symptoms persist or worsen. Patient is aware when to return to the clinic for a follow-up visit. Patient educated on when it is appropriate to go to the emergency department.   Time  call ended: 11:45  I provided 15 minutes of face-to-face time during this encounter.    [name] Done, FNP

## 2019-05-08 NOTE — Telephone Encounter (Signed)
Referral has been sent to cardiology for atypical chest pain.

## 2019-05-09 ENCOUNTER — Other Ambulatory Visit: Payer: Self-pay | Admitting: Nurse Practitioner

## 2019-05-17 NOTE — Progress Notes (Signed)
Cardiology Office Note:    Date:  05/19/2019   ID:  Crystal Wong, DOB 1955/12/01, MRN [id number]  PCP:  Chevis Pretty, FNP  Cardiologist:  No primary care provider on file.  Electrophysiologist:  None   Referring MD: Thornton Park, MD   Chief Complaint  Patient presents with  . Pre-op Exam    History of Present Illness:    Crystal Wong is a 64 y.o. female with a hx of anxiety, depression, asthma, GERD, hyperlipidemia who is referred by Dr. Tarri Glenn for pre-op evaluation.  She was referred to gastroenterology for EGD, requested cardiac evaluation prior to procedure.  She reports that she has been having a fluttering feeling in her chest that started in January.  Was occurring every day, but has not had for the last week.  Can last for 20 minutes up to hours.  In addition to chest fluttering, she sometimes avoids episodes where she feels like her heart is racing, but states that this is related to caffeine use.  Has also been having burning in her chest that she has attributed to GERD.  Has improved with increasing her GERD medications.  Reports that she exercises regularly, walks 4 miles per day and does Pilates for 30 minutes.  Denies any exertional chest pain.  Does report some dyspnea with walking up hills.  Never smoked.  Mother had MI at 10.   Reports she was told years ago she had mitral valve prolapse after she was evaluated for heart murmur   Past Medical History:  Diagnosis Date  . Anxiety   . Asthma   . Depression   . GERD (gastroesophageal reflux disease)   . Hyperlipidemia   . Insomnia     Past Surgical History:  Procedure Laterality Date  . ANKLE FRACTURE SURGERY Left    Pins and screws  . BREAST LUMPECTOMY Bilateral    left x 1, right x 2  . CARPAL TUNNEL RELEASE Right   . KNEE ARTHROSCOPY Bilateral   . PARTIAL HYSTERECTOMY      Current Medications: Current Meds  Medication Sig  . beclomethasone (QVAR REDIHALER) 80 MCG/ACT inhaler Inhale 2 puffs  into the lungs 2 (two) times daily.  . citalopram (CELEXA) 40 MG tablet TAKE 1 TABLET BY MOUTH EVERY DAY  . Dexlansoprazole 30 MG capsule Take 1 capsule (30 mg total) by mouth 2 (two) times daily.  . famotidine (PEPCID) 20 MG tablet Take 1 tablet (20 mg total) by mouth 2 (two) times daily.  . fluconazole (DIFLUCAN) 150 MG tablet 1 po q week x 4 weeks  . fluticasone (FLONASE) 50 MCG/ACT nasal spray SPRAY 2 SPRAYS INTO EACH NOSTRIL EVERY DAY  . hydrocortisone (ANUSOL-HC) 25 MG suppository Place 1 suppository (25 mg total) rectally 2 (two) times daily.  Marland Kitchen LORATADINE-D 24HR 10-240 MG 24 hr tablet Take 1 tablet by mouth once daily  . LORazepam (ATIVAN) 1 MG tablet Take 1 tablet (1 mg total) by mouth every 8 (eight) hours as needed.  . polyethylene glycol powder (GLYCOLAX/MIRALAX) 17 GM/SCOOP powder Take 17 g by mouth at bedtime.  . rosuvastatin (CRESTOR) 40 MG tablet Take 1 tablet (40 mg total) by mouth daily.  . sucralfate (CARAFATE) 1 g tablet TAKE 1 TABLET (1 G TOTAL) BY MOUTH 4 (FOUR) TIMES DAILY - WITH MEALS AND AT BEDTIME.  . VENTOLIN HFA 108 (90 Base) MCG/ACT inhaler INHALE 2 PUFFS BY MOUTH EVERY 6 HOURS AS NEEDED FOR WHEEZING     Allergies:   Sulfa  antibiotics, Sulfamethoxazole-trimethoprim, and Sulfur   Social History   Socioeconomic History  . Marital status: Married    Spouse name: Not on file  . Number of children: 2  . Years of education: Not on file  . Highest education level: Not on file  Occupational History  . Occupation: retired  Tobacco Use  . Smoking status: Never Smoker  . Smokeless tobacco: Never Used  Substance and Sexual Activity  . Alcohol use: Yes    Comment: occasional wine  . Drug use: No  . Sexual activity: Not on file  Other Topics Concern  . Not on file  Social History Narrative  . Not on file   Social Determinants of Health   Financial Resource Strain:   . Difficulty of Paying Living Expenses:   Food Insecurity:   . Worried About Sales executive in the Last Year:   . Arboriculturist in the Last Year:   Transportation Needs:   . Film/video editor (Medical):   Marland Kitchen Lack of Transportation (Non-Medical):   Physical Activity:   . Days of Exercise per Week:   . Minutes of Exercise per Session:   Stress:   . Feeling of Stress :   Social Connections:   . Frequency of Communication with Friends and Family:   . Frequency of Social Gatherings with Friends and Family:   . Attends Religious Services:   . Active Member of Clubs or Organizations:   . Attends Archivist Meetings:   Marland Kitchen Marital Status:      Family History: The patient's family history includes Colon cancer (age of onset: 52) in her father; Colon polyps in her mother; Diabetes in her mother; Gallbladder disease in her daughter; Gallstones in her paternal grandmother; Heart disease in her mother.  ROS:   Please see the history of present illness.     All other systems reviewed and are negative.  EKGs/Labs/Other Studies Reviewed:    The following studies were reviewed today:   EKG:  EKG is ordered today.  The ekg ordered today demonstrates normal sinus rhythm, rate 77, no ST/T wave abnormalities  Recent Labs: 08/26/2018: Hemoglobin 13.2; Platelets 263; TSH 1.610 02/20/2019: ALT 20; BUN 18; Creatinine, Ser 0.75; Potassium 4.9; Sodium 140  Recent Lipid Panel    Component Value Date/Time   CHOL 219 (H) 02/20/2019 1104   CHOL 225 (H) 08/23/2012 1155   TRIG 111 02/20/2019 1104   TRIG 125 12/23/2013 1004   TRIG 51 08/23/2012 1155   HDL 47 02/20/2019 1104   HDL 58 12/23/2013 1004   HDL 59 08/23/2012 1155   CHOLHDL 4.7 (H) 02/20/2019 1104   LDLCALC 152 (H) 02/20/2019 1104   LDLCALC 163 (H) 12/23/2013 1004   LDLCALC 156 (H) 08/23/2012 1155    Physical Exam:    VS:  BP 122/72   Pulse 85   Temp (!) 96.8 F (36 C) (Temporal)   Resp 15   Ht 5\' 5"  (1.651 m)   Wt 199 lb 12.8 oz (90.6 kg)   SpO2 96%   BMI 33.25 kg/m     Wt Readings from Last 3  Encounters:  05/19/19 199 lb 12.8 oz (90.6 kg)  04/23/19 201 lb 6 oz (91.3 kg)  04/16/19 202 lb (91.6 kg)     GEN:  Well nourished, well developed in no acute distress HEENT: Normal NECK: No JVD; No carotid bruits LYMPHATICS: No lymphadenopathy CARDIAC: RRR, 2/6 systolic hear murmur RESPIRATORY:  Clear to auscultation  without rales, wheezing or rhonchi  ABDOMEN: Soft, non-tender, non-distended MUSCULOSKELETAL:  No edema; No deformity  SKIN: Warm and dry NEUROLOGIC:  Alert and oriented x 3 PSYCHIATRIC:  Normal affect   ASSESSMENT:    1. Pre-operative cardiovascular examination   2. Mixed hyperlipidemia   3. Heart murmur   4. Palpitations    PLAN:    Preop evaluation: Prior to EGD.  Has been having burning chest pain likely due to GERD, no exertional chest pain.  Excellent functional capacity.  Given low risk procedure, no further cardiac work-up recommended prior to procedure  Palpitations: Reports fluttering feeling in chest, concerning for arrhythmia.  Will check Zio patch x2 weeks  Heart murmur: Reports was told she had mitral valve prolapse years ago.  Will check TTE  Hyperlipidemia: On rosuvastatin 40 mg daily.  LDL 172 on 08/26/2018.  Will check calcium score to help guide how aggressive to be in lowering her cholesterol  RTC in 3 months    Medication Adjustments/Labs and Tests Ordered: Current medicines are reviewed at length with the patient today.  Concerns regarding medicines are outlined above.  Orders Placed This Encounter  Procedures  . CT CARDIAC SCORING  . LONG TERM MONITOR (3-14 DAYS)  . EKG 12-Lead  . ECHOCARDIOGRAM COMPLETE   No orders of the defined types were placed in this encounter.   Patient Instructions  Medication Instructions:  Your physician recommends that you continue on your current medications as directed. Please refer to the Current Medication list given to you today.  Lab Work: NONE  Testing/Procedures: Your physician has  requested that you have an echocardiogram. Echocardiography is a painless test that uses sound waves to create images of your heart. It provides your doctor with information about the size and shape of your heart and how well your heart's chambers and valves are working. This procedure takes approximately one hour. There are no restrictions for this procedure. This will be done at our [facility] location:  [address]  CT coronary calcium score. This test is done at [address]. This is $150 out of pocket.   Coronary CalciumScan A coronary calcium scan is an imaging test used to look for deposits of calcium and other fatty materials (plaques) in the inner lining of the blood vessels of the heart (coronary arteries). These deposits of calcium and plaques can partly clog and narrow the coronary arteries without producing any symptoms or warning signs. This puts a person at risk for a heart attack. This test can detect these deposits before symptoms develop. Tell a health care provider about:  Any allergies you have.  All medicines you are taking, including vitamins, herbs, eye drops, creams, and over-the-counter medicines.  Any problems you or family members have had with anesthetic medicines.  Any blood disorders you have.  Any surgeries you have had.  Any medical conditions you have.  Whether you are pregnant or may be pregnant. What are the risks? Generally, this is a safe procedure. However, problems may occur, including:  Harm to a pregnant woman and her unborn baby. This test involves the use of radiation. Radiation exposure can be dangerous to a pregnant woman and her unborn baby. If you are pregnant, you generally should not have this procedure done.  Slight increase in the risk of cancer. This is because of the radiation involved in the test. What happens before the procedure? No preparation is needed for this procedure. What happens  during the procedure?  You will undress and remove any jewelry around your neck or chest.  You will put on a hospital gown.  Sticky electrodes will be placed on your chest. The electrodes will be connected to an electrocardiogram (ECG) machine to record a tracing of the electrical activity of your heart.  A CT scanner will take pictures of your heart. During this time, you will be asked to lie still and hold your breath for 2-3 seconds while a picture of your heart is being taken. The procedure may vary among health care providers and hospitals. What happens after the procedure?  You can get dressed.  You can return to your normal activities.  It is up to you to get the results of your test. Ask your health care provider, or the department that is doing the test, when your results will be ready. Summary  A coronary calcium scan is an imaging test used to look for deposits of calcium and other fatty materials (plaques) in the inner lining of the blood vessels of the heart (coronary arteries).  Generally, this is a safe procedure. Tell your health care provider if you are pregnant or may be pregnant.  No preparation is needed for this procedure.  A CT scanner will take pictures of your heart.  You can return to your normal activities after the scan is done. This information is not intended to replace advice given to you by your health care provider. Make sure you discuss any questions you have with your health care provider. Document Released: 08/19/2007 Document Revised: 01/10/2016 Document Reviewed: 01/10/2016 Elsevier Interactive Patient Education  2017 [employer] Term Monitor Instructions   Your physician has requested you wear your ZIO patch monitor 14 days.   This is a single patch monitor.  [employer] supplies one patch monitor per enrollment.  Additional stickers are not available.   Please do not apply patch if you will be having a Nuclear Stress Test,  Echocardiogram, Cardiac CT, MRI, or Chest Xray during the time frame you would be wearing the monitor. The patch cannot be worn during these tests.  You cannot remove and re-apply the ZIO XT patch monitor.   Your ZIO patch monitor will be sent USPS Priority mail from [facility] directly to your home address. The monitor may also be mailed to a PO BOX if home delivery is not available.   It may take 3-5 days to receive your monitor after you have been enrolled.   Once you have received you monitor, please review enclosed instructions.  Your monitor has already been registered assigning a specific monitor serial # to you.   Applying the monitor   Shave hair from upper left chest.   Hold abrader disc by orange tab.  Rub abrader in 40 strokes over left upper chest as indicated in your monitor instructions.   Clean area with 4 enclosed alcohol pads .  Use all pads to assure are is cleaned thoroughly.  Let dry.   Apply patch as indicated in monitor instructions.  Patch will be place under collarbone on left side of chest with arrow pointing upward.   Rub patch adhesive wings for 2 minutes.Remove white label marked "1".  Remove white label marked "2".  Rub patch adhesive wings for 2 additional minutes.   While looking in a mirror, press and release button in center of patch.  A small green light will flash 3-4 times .  This will be  your only indicator the monitor has been turned on.     Do not shower for the first 24 hours.  You may shower after the first 24 hours.   Press button if you feel a symptom. You will hear a small click.  Record Date, Time and Symptom in the Patient Log Book.   When you are ready to remove patch, follow instructions on last 2 pages of Patient Log Book.  Stick patch monitor onto last page of Patient Log Book.   Place Patient Log Book in [place] box.  Use locking tab on box and tape box closed securely.  The [employer] and [employer] has [employer] on it.  Please  place in mailbox as soon as possible.  Your physician should have your test results approximately 7 days after the monitor has been mailed back to [facility].   Call [facility] at [phone] if you have questions regarding your ZIO XT patch monitor.  Call them immediately if you see an orange light blinking on your monitor.   If your monitor falls off in less than 4 days contact our Monitor department at [phone].  If your monitor becomes loose or falls off after 4 days call [employer] at [phone] for suggestions on securing your monitor.      Follow-Up: At [facility], you and your health needs are our priority.  As part of our continuing mission to provide you with exceptional heart care, we have created designated Provider Care Teams.  These Care Teams include your primary Cardiologist (physician) and Advanced Practice Providers (APPs -  Physician Assistants and Nurse Practitioners) who all work together to provide you with the care you need, when you need it.  We recommend signing up for the patient portal called "MyChart".  Sign up information is provided on this After Visit Summary.  MyChart is used to connect with patients for Virtual Visits (Telemedicine).  Patients are able to view lab/test results, encounter notes, upcoming appointments, etc.  Non-urgent messages can be sent to your provider as well.   To learn more about what you can do with MyChart, go to [email or web address].    Your next appointment:   3 month(s)  The format for your next appointment:   In Person  Provider:   Oswaldo Milian, MD       Signed, Donato Heinz, MD  05/19/2019 1:10 PM    [place]

## 2019-05-19 ENCOUNTER — Other Ambulatory Visit: Payer: Self-pay

## 2019-05-19 ENCOUNTER — Encounter: Payer: Self-pay | Admitting: Cardiology

## 2019-05-19 ENCOUNTER — Encounter: Payer: Self-pay | Admitting: *Deleted

## 2019-05-19 ENCOUNTER — Ambulatory Visit: Payer: 59 | Admitting: Cardiology

## 2019-05-19 VITALS — BP 122/72 | HR 85 | Temp 96.8°F | Resp 15 | Ht 65.0 in | Wt 199.8 lb

## 2019-05-19 DIAGNOSIS — E782 Mixed hyperlipidemia: Secondary | ICD-10-CM

## 2019-05-19 DIAGNOSIS — R011 Cardiac murmur, unspecified: Secondary | ICD-10-CM

## 2019-05-19 DIAGNOSIS — R002 Palpitations: Secondary | ICD-10-CM

## 2019-05-19 DIAGNOSIS — Z01810 Encounter for preprocedural cardiovascular examination: Secondary | ICD-10-CM | POA: Diagnosis not present

## 2019-05-19 NOTE — Patient Instructions (Signed)
Medication Instructions:  Your physician recommends that you continue on your current medications as directed. Please refer to the Current Medication list given to you today.  Lab Work: NONE  Testing/Procedures: Your physician has requested that you have an echocardiogram. Echocardiography is a painless test that uses sound waves to create images of your heart. It provides your doctor with information about the size and shape of your heart and how well your heart's chambers and valves are working. This procedure takes approximately one hour. There are no restrictions for this procedure. This will be done at our [facility] location:  [address]  CT coronary calcium score. This test is done at [address]. This is $150 out of pocket.   Coronary CalciumScan A coronary calcium scan is an imaging test used to look for deposits of calcium and other fatty materials (plaques) in the inner lining of the blood vessels of the heart (coronary arteries). These deposits of calcium and plaques can partly clog and narrow the coronary arteries without producing any symptoms or warning signs. This puts a person at risk for a heart attack. This test can detect these deposits before symptoms develop. Tell a health care provider about:  Any allergies you have.  All medicines you are taking, including vitamins, herbs, eye drops, creams, and over-the-counter medicines.  Any problems you or family members have had with anesthetic medicines.  Any blood disorders you have.  Any surgeries you have had.  Any medical conditions you have.  Whether you are pregnant or may be pregnant. What are the risks? Generally, this is a safe procedure. However, problems may occur, including:  Harm to a pregnant woman and her unborn baby. This test involves the use of radiation. Radiation exposure can be dangerous to a pregnant woman and her unborn baby. If you are pregnant, you  generally should not have this procedure done.  Slight increase in the risk of cancer. This is because of the radiation involved in the test. What happens before the procedure? No preparation is needed for this procedure. What happens during the procedure?  You will undress and remove any jewelry around your neck or chest.  You will put on a hospital gown.  Sticky electrodes will be placed on your chest. The electrodes will be connected to an electrocardiogram (ECG) machine to record a tracing of the electrical activity of your heart.  A CT scanner will take pictures of your heart. During this time, you will be asked to lie still and hold your breath for 2-3 seconds while a picture of your heart is being taken. The procedure may vary among health care providers and hospitals. What happens after the procedure?  You can get dressed.  You can return to your normal activities.  It is up to you to get the results of your test. Ask your health care provider, or the department that is doing the test, when your results will be ready. Summary  A coronary calcium scan is an imaging test used to look for deposits of calcium and other fatty materials (plaques) in the inner lining of the blood vessels of the heart (coronary arteries).  Generally, this is a safe procedure. Tell your health care provider if you are pregnant or may be pregnant.  No preparation is needed for this procedure.  A CT scanner will take pictures of your heart.  You can return to your normal activities after the scan is done. This  information is not intended to replace advice given to you by your health care provider. Make sure you discuss any questions you have with your health care provider. Document Released: 08/19/2007 Document Revised: 01/10/2016 Document Reviewed: 01/10/2016 Elsevier Interactive Patient Education  2017 [employer] Term Monitor Instructions   Your physician has requested you  wear your ZIO patch monitor 14 days.   This is a single patch monitor.  [employer] supplies one patch monitor per enrollment.  Additional stickers are not available.   Please do not apply patch if you will be having a Nuclear Stress Test, Echocardiogram, Cardiac CT, MRI, or Chest Xray during the time frame you would be wearing the monitor. The patch cannot be worn during these tests.  You cannot remove and re-apply the ZIO XT patch monitor.   Your ZIO patch monitor will be sent USPS Priority mail from [facility] directly to your home address. The monitor may also be mailed to a PO BOX if home delivery is not available.   It may take 3-5 days to receive your monitor after you have been enrolled.   Once you have received you monitor, please review enclosed instructions.  Your monitor has already been registered assigning a specific monitor serial # to you.   Applying the monitor   Shave hair from upper left chest.   Hold abrader disc by orange tab.  Rub abrader in 40 strokes over left upper chest as indicated in your monitor instructions.   Clean area with 4 enclosed alcohol pads .  Use all pads to assure are is cleaned thoroughly.  Let dry.   Apply patch as indicated in monitor instructions.  Patch will be place under collarbone on left side of chest with arrow pointing upward.   Rub patch adhesive wings for 2 minutes.Remove white label marked "1".  Remove white label marked "2".  Rub patch adhesive wings for 2 additional minutes.   While looking in a mirror, press and release button in center of patch.  A small green light will flash 3-4 times .  This will be your only indicator the monitor has been turned on.     Do not shower for the first 24 hours.  You may shower after the first 24 hours.   Press button if you feel a symptom. You will hear a small click.  Record Date, Time and Symptom in the Patient Log Book.   When you are ready to remove patch, follow instructions on last 2  pages of Patient Log Book.  Stick patch monitor onto last page of Patient Log Book.   Place Patient Log Book in [place] box.  Use locking tab on box and tape box closed securely.  The [employer] has [employer] on it.  Please place in mailbox as soon as possible.  Your physician should have your test results approximately 7 days after the monitor has been mailed back to [facility].   Call [facility] at [phone] if you have questions regarding your ZIO XT patch monitor.  Call them immediately if you see an orange light blinking on your monitor.   If your monitor falls off in less than 4 days contact our Monitor department at [phone].  If your monitor becomes loose or falls off after 4 days call [employer] at [phone] for suggestions on securing your monitor.      Follow-Up: At [facility], you and your health needs are our priority.  As part of our continuing mission to provide you with exceptional heart care, we have created designated Provider Care Teams.  These Care Teams include your primary Cardiologist (physician) and Advanced Practice Providers (APPs -  Physician Assistants and Nurse Practitioners) who all work together to provide you with the care you need, when you need it.  We recommend signing up for the patient portal called "MyChart".  Sign up information is provided on this After Visit Summary.  MyChart is used to connect with patients for Virtual Visits (Telemedicine).  Patients are able to view lab/test results, encounter notes, upcoming appointments, etc.  Non-urgent messages can be sent to your provider as well.   To learn more about what you can do with MyChart, go to [email or web address].    Your next appointment:   3 month(s)  The format for your next appointment:   In Person  Provider:   Oswaldo Milian, MD

## 2019-05-19 NOTE — Progress Notes (Signed)
Patient ID: Crystal Wong, female   DOB: 1955-05-27, 64 y.o.   MRN: [id number] Patient enrolled for [employer] to mail a 14 day ZIO XT long term holter monitor to her home.

## 2019-05-26 ENCOUNTER — Ambulatory Visit (AMBULATORY_SURGERY_CENTER): Payer: Self-pay | Admitting: *Deleted

## 2019-05-26 ENCOUNTER — Other Ambulatory Visit: Payer: Self-pay

## 2019-05-26 VITALS — Temp 97.5°F | Ht 65.0 in | Wt 193.0 lb

## 2019-05-26 DIAGNOSIS — K219 Gastro-esophageal reflux disease without esophagitis: Secondary | ICD-10-CM

## 2019-05-26 NOTE — Progress Notes (Signed)

## 2019-05-30 ENCOUNTER — Other Ambulatory Visit: Payer: Self-pay | Admitting: Nurse Practitioner

## 2019-06-03 ENCOUNTER — Ambulatory Visit (HOSPITAL_COMMUNITY): Payer: 59 | Attending: Cardiology

## 2019-06-03 ENCOUNTER — Other Ambulatory Visit: Payer: Self-pay

## 2019-06-03 ENCOUNTER — Ambulatory Visit (INDEPENDENT_AMBULATORY_CARE_PROVIDER_SITE_OTHER)
Admission: RE | Admit: 2019-06-03 | Discharge: 2019-06-03 | Disposition: A | Discharge status: Home / Self Care | Payer: Self-pay | Source: Ambulatory Visit | Attending: Cardiology | Admitting: Cardiology

## 2019-06-03 DIAGNOSIS — E782 Mixed hyperlipidemia: Secondary | ICD-10-CM

## 2019-06-03 DIAGNOSIS — Z01810 Encounter for preprocedural cardiovascular examination: Secondary | ICD-10-CM

## 2019-06-03 DIAGNOSIS — R011 Cardiac murmur, unspecified: Secondary | ICD-10-CM | POA: Insufficient documentation

## 2019-06-05 ENCOUNTER — Encounter: Payer: Self-pay | Admitting: Gastroenterology

## 2019-06-05 ENCOUNTER — Telehealth: Payer: Self-pay | Admitting: Cardiology

## 2019-06-05 DIAGNOSIS — I350 Nonrheumatic aortic (valve) stenosis: Secondary | ICD-10-CM

## 2019-06-05 NOTE — Telephone Encounter (Signed)
New Message    Pt is calling for results     Please call back

## 2019-06-05 NOTE — Telephone Encounter (Signed)
Patient called w/echo results and notified that prelim calcium score report shows score is zero. Repeat echo for 1 year ordered

## 2019-06-05 NOTE — Telephone Encounter (Signed)
Called and spoke with pt, reviewed echo results. Notified that her Cardiac scoring test had not yet been resulted but when it was we would contact her. Pt also states she received her heart monitor and stated that her and her husband were going on a beach trip and she wanted to know if she could swim with the monitor on. Notified pt that on her instructions sheet there should be a number to call with questions. Pt asked if she would be able to just wait until she was back from her trip to place the monitor. Notified that if she was comfortable with this and wasn't having symptoms while on her trip that this should be fine, just to let the company know that she was waiting to place the monitor so they wouldn't continue to contact her. Pt verbalized under standing and had no other questions at this time.

## 2019-06-09 ENCOUNTER — Other Ambulatory Visit: Payer: Self-pay

## 2019-06-09 ENCOUNTER — Ambulatory Visit (AMBULATORY_SURGERY_CENTER): Payer: 59 | Admitting: Gastroenterology

## 2019-06-09 ENCOUNTER — Encounter: Payer: Self-pay | Admitting: Gastroenterology

## 2019-06-09 VITALS — BP 103/59 | HR 61 | Temp 97.1°F | Resp 16 | Ht 65.0 in | Wt 193.0 lb

## 2019-06-09 DIAGNOSIS — K2950 Unspecified chronic gastritis without bleeding: Secondary | ICD-10-CM

## 2019-06-09 DIAGNOSIS — K219 Gastro-esophageal reflux disease without esophagitis: Secondary | ICD-10-CM

## 2019-06-09 MED ORDER — SODIUM CHLORIDE 0.9 % IV SOLN: 500.0000 mL | Freq: Once | INTRAVENOUS | Status: DC

## 2019-06-09 NOTE — Progress Notes (Signed)
Pt's states no medical or surgical changes since previsit or office visit. 

## 2019-06-09 NOTE — Progress Notes (Signed)
Temp JB Vitals CW 

## 2019-06-09 NOTE — Op Note (Signed)
[place] Patient Name: Crystal Wong Procedure Date: 06/09/2019 1:13 PM MRN: [id number] Endoscopist: Thornton Park MD, MD Age: 64 Referring MD:  Date of Birth: [birth year] Gender: Female Account #: [id number] Procedure:                Upper GI endoscopy Indications:              Chest pain (non cardiac)                           Chest/esophagus/abdominal spasms - ? Esophageal in                            nature                           Reflux without dysphagia                           - prior EGD with Dr. Roney Mans 2017                           - no biopsies were obtained at the time of endoscopy Medicines:                Monitored Anesthesia Care Procedure:                Pre-Anesthesia Assessment:                           - Prior to the procedure, a History and Physical                            was performed, and patient medications and                            allergies were reviewed. The patient's tolerance of                            previous anesthesia was also reviewed. The risks                            and benefits of the procedure and the sedation                            options and risks were discussed with the patient.                            All questions were answered, and informed consent                            was obtained. Prior Anticoagulants: The patient has                            taken no previous anticoagulant or antiplatelet                            agents.  ASA Grade Assessment: III - A patient with                            severe systemic disease. After reviewing the risks                            and benefits, the patient was deemed in                            satisfactory condition to undergo the procedure.                           After obtaining informed consent, the endoscope was                            passed under direct vision. Throughout the                            procedure, the patient's blood  pressure, pulse, and                            oxygen saturations were monitored continuously. The                            Endoscope was introduced through the mouth, and                            advanced to the third part of duodenum. The upper                            GI endoscopy was accomplished without difficulty.                            The patient tolerated the procedure well. Scope In: Scope Out: Findings:                 The examined esophagus was normal. Biopsies were                            obtained from the proximal and distal esophagus                            with cold forceps for histology to evaluate for                            eosinophilic esophagitis.                           Striped mildly erythematous mucosa without bleeding                            was found in the gastric antrum. Biopsies were                            taken  from the antrum, body, and fundus with a cold                            forceps for histology. Estimated blood loss was                            minimal.                           The examined duodenum was normal.                           The cardia and gastric fundus were normal on                            retroflexion.                           The exam was otherwise without abnormality. Complications:            No immediate complications. Estimated blood loss:                            Minimal. Estimated Blood Loss:     Estimated blood loss was minimal. Impression:               - Normal esophagus. Biopsied.                           - Erythematous mucosa in the antrum. Biopsied.                           - Normal examined duodenum.                           - The examination was otherwise normal. Recommendation:           - Patient has a contact number available for                            emergencies. The signs and symptoms of potential                            delayed complications were discussed with  the                            patient. Return to normal activities tomorrow.                            Written discharge instructions were provided to the                            patient.                           - Resume previous diet.                           -  Continue present medications including Dexilant                            30 mg twice daily and famotidine 20 mg twice daily.                           - No aspirin, ibuprofen, naproxen, or other                            non-steroidal anti-inflammatory drugs.                           - Await pathology results.                           - Consider esophageal manometry if pathology                            results are normal and symptoms recur. Thornton Park MD, MD 06/09/2019 1:44:31 PM This report has been signed electronically.

## 2019-06-09 NOTE — Progress Notes (Signed)
To PACU, VSS. Report to [email or web address].tb 

## 2019-06-09 NOTE — Progress Notes (Signed)
Patient reports that she brought a gray inhaler with her. Admitting staff reports that they saw the inhaler and that it was placed in a basin and set on the counter top in the admitting bay. Patient arrived to recovery and there was no basin and no inhaler noted, only clothing and glasses. Staff in procedure room report that they did not see an inhaler in a basin under the patient's bed and cannot recall if it was on the counter top in the admitting bay. Staff searched for the inhaler in admitting, the procedure room, in the sheets and under the bed as well as the trash cans in admitting, procedure, and recovery areas. No inhaler was found. Patient was notified and instructed that if the inhaler is located that [facility] will mail it to her. Patient verbalized understanding.

## 2019-06-09 NOTE — Progress Notes (Signed)
Called to room to assist during endoscopic procedure.  Patient ID and intended procedure confirmed with present staff. Received instructions for my participation in the procedure from the performing physician.  

## 2019-06-09 NOTE — Patient Instructions (Signed)
Handout provided on Gastritis.   No aspirin, ibuprofen, naproxen, or other non-steroidal anti-inflammatory drugs.   YOU HAD AN ENDOSCOPIC PROCEDURE TODAY AT [facility]:   Refer to the procedure report that was given to you for any specific questions about what was found during the examination.  If the procedure report does not answer your questions, please call your gastroenterologist to clarify.  If you requested that your care partner not be given the details of your procedure findings, then the procedure report has been included in a sealed envelope for you to review at your convenience later.  YOU SHOULD EXPECT: Some feelings of bloating in the abdomen. Passage of more gas than usual.  Walking can help get rid of the air that was put into your GI tract during the procedure and reduce the bloating. If you had a lower endoscopy (such as a colonoscopy or flexible sigmoidoscopy) you may notice spotting of blood in your stool or on the toilet paper. If you underwent a bowel prep for your procedure, you may not have a normal bowel movement for a few days.  Please Note:  You might notice some irritation and congestion in your nose or some drainage.  This is from the oxygen used during your procedure.  There is no need for concern and it should clear up in a day or so.  SYMPTOMS TO REPORT IMMEDIATELY:   Following upper endoscopy (EGD)  Vomiting of blood or coffee ground material  New chest pain or pain under the shoulder blades  Painful or persistently difficult swallowing  New shortness of breath  Fever of 100F or higher  Black, tarry-looking stools  For urgent or emergent issues, a gastroenterologist can be reached at any hour by calling [phone]. Do not use MyChart messaging for urgent concerns.    DIET:  We do recommend a small meal at first, but then you may proceed to your regular diet.  Drink plenty of fluids but you should avoid alcoholic beverages for 24  hours.  ACTIVITY:  You should plan to take it easy for the rest of today and you should NOT DRIVE or use heavy machinery until tomorrow (because of the sedation medicines used during the test).    FOLLOW UP: Our staff will call the number listed on your records 48-72 hours following your procedure to check on you and address any questions or concerns that you may have regarding the information given to you following your procedure. If we do not reach you, we will leave a message.  We will attempt to reach you two times.  During this call, we will ask if you have developed any symptoms of COVID 19. If you develop any symptoms (ie: fever, flu-like symptoms, shortness of breath, cough etc.) before then, please call [phone].  If you test positive for Covid 19 in the 2 weeks post procedure, please call and report this information to [place].    If any biopsies were taken you will be contacted by phone or by letter within the next 1-3 weeks.  Please call us at [phone] if you have not heard about the biopsies in 3 weeks.    SIGNATURES/CONFIDENTIALITY: You and/or your care partner have signed paperwork which will be entered into your electronic medical record.  These signatures attest to the fact that that the information above on your After Visit Summary has been reviewed and is understood.  Full responsibility of the confidentiality of this discharge information lies with you  and/or your care-partner.

## 2019-06-10 ENCOUNTER — Telehealth: Payer: Self-pay

## 2019-06-10 NOTE — Telephone Encounter (Signed)
Called patient to inform her that her inhaler has been found and will be left at the front desk of [facility] for her to pick up at her convenience.

## 2019-06-11 ENCOUNTER — Telehealth: Payer: Self-pay

## 2019-06-11 NOTE — Telephone Encounter (Signed)
Left message on follow up call. 

## 2019-06-11 NOTE — Telephone Encounter (Signed)
  Follow up Call-  Call back number 06/09/2019  Post procedure Call Back phone  # [phone]  Permission to leave phone message Yes  Some recent data might be hidden     Patient questions:  Do you have a fever, pain , or abdominal swelling? No. Pain Score  0 *  Have you tolerated food without any problems? Yes.    Have you been able to return to your normal activities? Yes.    Do you have any questions about your discharge instructions: Diet   No. Medications  No. Follow up visit  No.  Do you have questions or concerns about your Care? No.  Actions: * If pain score is 4 or above: No action needed, pain <4.  1. Have you developed a fever since your procedure? no  2.   Have you had an respiratory symptoms (SOB or cough) since your procedure? no  3.   Have you tested positive for COVID 19 since your procedure no  4.   Have you had any family members/close contacts diagnosed with the COVID 19 since your procedure?  no   If yes to any of these questions please route to Joylene John, RN and Erenest Rasher, RN

## 2019-06-18 ENCOUNTER — Ambulatory Visit: Payer: 59

## 2019-06-18 DIAGNOSIS — R002 Palpitations: Secondary | ICD-10-CM

## 2019-07-01 ENCOUNTER — Telehealth: Payer: Self-pay | Admitting: Nurse Practitioner

## 2019-07-01 NOTE — Telephone Encounter (Signed)
  REFERRAL REQUEST Telephone Note 07/01/2019  What type of referral do you need? Dermatologist  Have you been seen at our office for this problem?Place on back of her neck, wart, fatty deposits around her eye, full body scan (Advise that they may need an appointment with their PCP before a referral can be done)  Is there a particular doctor or location that you prefer? [facility], needs to get appt before June 30 when insurance turns over  Patient notified that referrals can take up to a week or longer to process. If they haven't heard anything within a week they should call back and speak with the referral department.

## 2019-07-04 ENCOUNTER — Encounter: Payer: Self-pay | Admitting: Family Medicine

## 2019-07-04 ENCOUNTER — Telehealth (INDEPENDENT_AMBULATORY_CARE_PROVIDER_SITE_OTHER): Payer: 59 | Admitting: Family Medicine

## 2019-07-04 ENCOUNTER — Other Ambulatory Visit: Payer: Self-pay | Admitting: Nurse Practitioner

## 2019-07-04 DIAGNOSIS — L255 Unspecified contact dermatitis due to plants, except food: Secondary | ICD-10-CM | POA: Diagnosis not present

## 2019-07-04 DIAGNOSIS — L989 Disorder of the skin and subcutaneous tissue, unspecified: Secondary | ICD-10-CM

## 2019-07-04 MED ORDER — PREDNISONE 10 MG (21) PO TBPK: ORAL_TABLET | ORAL | 0 refills | Status: DC

## 2019-07-04 NOTE — Progress Notes (Signed)
Virtual Visit via Video note  I connected with Crystal Wong on 07/04/19 at 10:45 AM by video and verified that I am speaking with the correct person using two identifiers. Crystal Wong is currently located at home and her husband is currently with her during visit. The provider, Loman Brooklyn, FNP is located in their home at time of visit.  I discussed the limitations, risks, security and privacy concerns of performing an evaluation and management service by video and the availability of in person appointments. I also discussed with the patient that there may be a patient responsible charge related to this service. The patient expressed understanding and agreed to proceed.  Subjective: PCP: Chevis Pretty, FNP  Chief Complaint  Patient presents with  . Poison Oak   Patient has poison oak to her right forearm. She has been applying OTC cream without any improvement.    ROS: Per HPI  Current Outpatient Medications:  .  beclomethasone (QVAR REDIHALER) 80 MCG/ACT inhaler, Inhale 2 puffs into the lungs 2 (two) times daily. (Patient not taking: Reported on 05/26/2019), Disp: 10.6 g, Rfl: 3 .  citalopram (CELEXA) 40 MG tablet, TAKE 1 TABLET BY MOUTH EVERY DAY, Disp: 90 tablet, Rfl: 1 .  Dexlansoprazole 30 MG capsule, Take 1 capsule (30 mg total) by mouth 2 (two) times daily., Disp: 60 capsule, Rfl: 3 .  famotidine (PEPCID) 20 MG tablet, Take 1 tablet (20 mg total) by mouth 2 (two) times daily., Disp: 60 tablet, Rfl: 3 .  fluconazole (DIFLUCAN) 150 MG tablet, 1 po q week x 4 weeks (Patient not taking: Reported on 06/09/2019), Disp: 4 tablet, Rfl: 0 .  fluticasone (FLONASE) 50 MCG/ACT nasal spray, SPRAY 2 SPRAYS INTO EACH NOSTRIL EVERY DAY (Patient not taking: Reported on 06/09/2019), Disp: 48 mL, Rfl: 1 .  hydrocortisone (ANUSOL-HC) 25 MG suppository, Place 1 suppository (25 mg total) rectally 2 (two) times daily. (Patient not taking: Reported on 05/26/2019), Disp: 12 suppository, Rfl: 0 .   LORATADINE-D 24HR 10-240 MG 24 hr tablet, Take 1 tablet by mouth once daily, Disp: 90 tablet, Rfl: 0 .  LORazepam (ATIVAN) 1 MG tablet, Take 1 tablet (1 mg total) by mouth every 8 (eight) hours as needed., Disp: 30 tablet, Rfl: 5 .  polyethylene glycol powder (GLYCOLAX/MIRALAX) 17 GM/SCOOP powder, Take 17 g by mouth at bedtime., Disp: , Rfl:  .  rosuvastatin (CRESTOR) 40 MG tablet, Take 1 tablet (40 mg total) by mouth daily., Disp: 90 tablet, Rfl: 1 .  sucralfate (CARAFATE) 1 g tablet, TAKE 1 TABLET (1 G TOTAL) BY MOUTH 4 (FOUR) TIMES DAILY - WITH MEALS AND AT BEDTIME., Disp: 120 tablet, Rfl: 2 .  VENTOLIN HFA 108 (90 Base) MCG/ACT inhaler, INHALE 2 PUFFS BY MOUTH EVERY 6 HOURS AS NEEDED FOR WHEEZING, Disp: 18 g, Rfl: 0  Allergies  Allergen Reactions  . Sulfa Antibiotics Anaphylaxis  . Sulfamethoxazole-Trimethoprim Swelling  . Sulfur Other (See Comments)    Other reaction(s): Angioedema (ALLERGY/intolerance) Other reaction(s): Angioedema (ALLERGY/intolerance)   Past Medical History:  Diagnosis Date  . Anxiety   . Asthma   . Depression   . GERD (gastroesophageal reflux disease)   . Heart murmur   . Hyperlipidemia   . Insomnia     Observations/Objective: Physical Exam Constitutional:      General: She is not in acute distress.    Appearance: Normal appearance. She is not ill-appearing or toxic-appearing.  Eyes:     General: No scleral icterus.  Right eye: No discharge.        Left eye: No discharge.     Conjunctiva/sclera: Conjunctivae normal.  Pulmonary:     Effort: Pulmonary effort is normal. No respiratory distress.  Skin:    Findings: Rash present. Rash is vesicular (right forearm).  Neurological:     Mental Status: She is alert and oriented to person, place, and time.  Psychiatric:        Mood and Affect: Mood normal.        Behavior: Behavior normal.        Thought Content: Thought content normal.        Judgment: Judgment normal.    Assessment and Plan: 1.  Rhus dermatitis - Steroids. Calamine lotion. Zyrtec QD for itching. Cool compresses. Education provided on poison oak.  - predniSONE (STERAPRED UNI-PAK 21 TAB) 10 MG (21) TBPK tablet; As directed x 6 days  Dispense: 21 tablet; Refill: 0   Follow Up Instructions:   I discussed the assessment and treatment plan with the patient. The patient was provided an opportunity to ask questions and all were answered. The patient agreed with the plan and demonstrated an understanding of the instructions.   The patient was advised to call back or seek an in-person evaluation if the symptoms worsen or if the condition fails to improve as anticipated.  The above assessment and management plan was discussed with the patient. The patient verbalized understanding of and has agreed to the management plan. Patient is aware to call the clinic if symptoms persist or worsen. Patient is aware when to return to the clinic for a follow-up visit. Patient educated on when it is appropriate to go to the emergency department.   Time call ended: 10:52 AM  I provided 9 minutes of face-to-face time during this encounter.   Hendricks Limes, MSN, APRN, FNP-C [facility] 07/04/19

## 2019-07-04 NOTE — Patient Instructions (Signed)
Poison Oak Dermatitis  Poison oak dermatitis is redness and soreness (inflammation) of the skin caused by chemicals in the leaves of the poison oak plant. You may have very bad itching, swelling, a rash, and blisters. What are the causes? You may get this condition by:  Touching a poison oak plant.  Touching something that has the chemical from the leaves on it. This may include animals or objects that have come in contact with the plant. What increases the risk? You are more likely to get this condition if you:  Go outdoors often in wooded or marshy areas.  Go outdoors without wearing protective clothing, such as closed shoes, long pants, and a long-sleeved shirt. What are the signs or symptoms? Symptoms of this condition include:  Redness of the skin.  Very bad itching.  A rash that often includes bumps and blisters. ? The rash usually appears 48 hours after exposure if you have been exposed before. ? If this is the first time you have been exposed, the rash may not appear until a week after exposure.  Swelling. This may occur if the reaction is very bad. Symptoms often clear up in 1-2 weeks. The first time you develop this condition, symptoms may last 3-4 weeks. How is this treated? This condition may be treated with:  Hydrocortisone creams or calamine lotions to help with itching.  Oatmeal baths to soothe the skin.  Medicines to help reduce itching (antihistamines). If you have a very bad reaction, you may also be given steroid medicines. Follow these instructions at home: Medicines  Take or apply over-the-counter and prescription medicines only as told by your doctor.  Use hydrocortisone creams or calamine lotion as needed to help with itching. General instructions  Do not scratch or rub your skin.  Put a cold, wet cloth (cold compress) on the affected areas or take baths in cool water. This will help with itching.  Avoid hot baths and showers.  Take oatmeal  baths as needed. Use colloidal oatmeal. You can get this at a pharmacy or grocery store. Follow the instructions on the package.  While you have the rash, wash your clothes right after you wear them.  Keep all follow-up visits as told by your doctor. This is important. How is this prevented?   Know what poison oak looks like so you can avoid it. ? This plant has three leaves with flowering branches on a single stem. ? The leaves are fuzzy. ? The edges of the leaves look like teeth.  If you have touched poison oak, wash your skin with soap and water right away. Be sure to wash under your fingernails.  When hiking or camping, wear long pants, a long-sleeved shirt, tall socks, and hiking boots. You can also use a lotion on your skin that helps to prevent contact with the chemical on the plant.  If you think that your clothes or outdoor gear came in contact with poison oak, rinse them off with a garden hose before you bring them inside your house.  When doing yard work or gardening, wear gloves, long sleeves, long pants, and boots. Wash your garden tools and gloves if they come in contact with poison oak.  If you think that your pet has come into contact with poison oak, wash him or her with pet shampoo and water. Make sure you wear gloves while washing your pet.  Do not burn poison oak plants. This can release the chemical from the plant into the air and   may cause a reaction. Contact a doctor if:  You have open sores in the rash area.  You have more redness, swelling, or pain in the affected area.  You have redness that spreads beyond the rash area.  You have fluid, blood, or pus coming from the affected area.  You have a fever.  You have a rash over a large area of your body.  You have a rash on your eyes, mouth, or genitals.  Your rash does not improve after a few weeks. Get help right away if:  Your face swells or your eyes swell shut.  You have trouble breathing.  You  have trouble swallowing. These symptoms may be an emergency. Do not wait to see if the symptoms will go away. Get medical help right away. Call your local emergency services (911 in the [place] Do not drive yourself to the hospital. Summary  Poison oak dermatitis is redness and soreness of the skin caused by chemicals in the leaves of the poison oak plant.  Symptoms of this condition include redness, very bad itching, a rash, and swelling.  Do not scratch or rub your skin.  Take or apply over-the-counter and prescription medicines only as told by your doctor. This information is not intended to replace advice given to you by your health care provider. Make sure you discuss any questions you have with your health care provider. Document Revised: 06/14/2018 Document Reviewed: 03/22/2018 Elsevier Patient Education  2020 [employer].  

## 2019-07-04 NOTE — Telephone Encounter (Signed)
Patient aware.

## 2019-07-04 NOTE — Progress Notes (Signed)
Referral to derm made

## 2019-07-04 NOTE — Telephone Encounter (Signed)
Referral made to dermatology.

## 2019-07-13 ENCOUNTER — Other Ambulatory Visit: Payer: Self-pay | Admitting: Nurse Practitioner

## 2019-07-13 DIAGNOSIS — K648 Other hemorrhoids: Secondary | ICD-10-CM

## 2019-07-19 ENCOUNTER — Other Ambulatory Visit: Payer: Self-pay

## 2019-07-19 ENCOUNTER — Other Ambulatory Visit: Payer: Self-pay | Admitting: Gastroenterology

## 2019-07-19 MED ORDER — DEXLANSOPRAZOLE 30 MG PO CPDR: 30.0000 mg | DELAYED_RELEASE_CAPSULE | Freq: Two times a day (BID) | ORAL | 3 refills | Status: DC

## 2019-07-19 NOTE — Progress Notes (Signed)
Refill request for dexilant 60 mg once daily.  Per OV and procedure notes pt should be taking 30 mg BID.  Refill sent for dexlansoprazole 30 mg bid

## 2019-07-22 ENCOUNTER — Encounter: Payer: Self-pay | Admitting: Gastroenterology

## 2019-07-22 ENCOUNTER — Ambulatory Visit: Payer: 59 | Admitting: Gastroenterology

## 2019-07-22 VITALS — BP 122/79 | HR 75 | Ht 65.0 in | Wt 184.4 lb

## 2019-07-22 DIAGNOSIS — K2100 Gastro-esophageal reflux disease with esophagitis, without bleeding: Secondary | ICD-10-CM | POA: Diagnosis not present

## 2019-07-22 DIAGNOSIS — K2990 Gastroduodenitis, unspecified, without bleeding: Secondary | ICD-10-CM

## 2019-07-22 DIAGNOSIS — R0789 Other chest pain: Secondary | ICD-10-CM

## 2019-07-22 DIAGNOSIS — K5900 Constipation, unspecified: Secondary | ICD-10-CM

## 2019-07-22 DIAGNOSIS — K2970 Gastritis, unspecified, without bleeding: Secondary | ICD-10-CM | POA: Diagnosis not present

## 2019-07-22 MED ORDER — DEXLANSOPRAZOLE 60 MG PO CPDR
60.0000 mg | DELAYED_RELEASE_CAPSULE | Freq: Every day | ORAL | 3 refills | Status: DC
Start: 2019-07-22 — End: 2019-08-28

## 2019-07-22 NOTE — Patient Instructions (Addendum)
Continue with Miralax every day.  If you are looking for natural ways to treatment constipation, I recommend the Move It Potion.  Mix equal parts of applesauce, bran, and prune juice. Store in a sealed container in the [place]. Use 2 tablespoons nightly. Increase as needed. Sit on the potty after having your morning cup of coffee to try to have a bowel movement.   There is some recent data that two kiwi every day helps with constipation as much as any other other the counter medication.   Check out the book Fiber Fueled. You might help find some helpful information there.  I will refer you to a nutritionist to further discuss dietary options.   We will try to get your insurance company to approve Dexilant 60 mg daily in addition to famotidine 20 mg twice daily. If your symptoms persist despite treatment, there is additional testing that we should consider. Please keep me posted.   Thank you for putting your trust in me. Let's plan another office visit in 3 months, earlier if needed.

## 2019-07-22 NOTE — Progress Notes (Signed)
Referring Provider: Chevis Pretty, * Primary Care Physician:  Chevis Pretty, FNP  Chief complaint: Reflux, esophageal spasms, abdominal pain, and constipation   IMPRESSION:  Chronic constipation    - TSH normal at 1.610 08/26/2018    - Clacium normal on repeated testing 2020 Chest/esophagus/abdominal spasms - ? Esophageal in nature Reflux esophagitis without dysphagia    - prior EGD with Dr. Roney Mans 2017, no biopsies obtained at that time    - EGD 06/09/19: reflux esophagitis, no eosinophilic esophagitis or Barrett's Esophagus Mild H pylori negative gastritis on EGD 06/09/19 Asymptomatic cholelithiasis Colonoscopy with Dr. Roney Mans 2017 Family history of colon cancer (father in his 30s)  GERD manifesting as chest spasms: Symptoms persist despite Dexilant 30 mg twice daily and famotidine 20 mg twice daily.  Esophageal biopsies negative for EOE. Resume Dexilant 60 mg daily and famotidine 20 mg BID. Recommend 24 pH probe, impedence testing, and esophageal manometry if not improving.   H pylori negative gastritis: Continue PPI and H2Blocker. Avoid all NSAIDs.   Chronic constipation: Discussed dietary recommendations, including Move-It Potion. I recommended that she continue to use Miralax.   PLAN: Increase Dexilant to 60 mg daily, continue famotidine 20 mg twice daily Reviewed lifestyle modifications including working to maintain a health weight Continue daily Miralax Discussed dietary recommendation for constipation 24 pH probe, impedence testing, and esophageal manometry if breakthrough symptoms persist Referral to nutritionist at patient request Follow-up in 3 months, earlier if needed  Please see the "Patient Instructions" section for addition details about the plan.  HPI: Crystal Wong is a 64 y.o. female returns in scheduled follow-up for reflux. The interval history is obtained through the patient and review of her electronic health record. Retired Designer, jewellery. She has anxiety, asthma, depression, reflux, hypercholesterolemia, obesity, and prior hysterectomy.   Several years of GERD.  Symptoms consist of intermittent postprandial pyrosis with associated dysphonia and throat discomfort.  Records show occasional dysphagia to solids and intermittent epigastric pain.    She was having acute, intermittent "attacks" described as chest and/or stomach spasms. Frequent brash and nocturnal symptoms.   No dysphagia or odynophagia. No dysphonia. No sore throat or neck pain.   Increased famotidine to 2 pills QHS and continued her Dexilant 30 mg QAM. Carafate 1 g QID was also added, but she found the dosing schedule cumbersome.  Was having significant gas at that time. Changed her diet to use protein shakes with fruit in the morning, cut back on caffeine, eating less processed foods with smaller portions. Working out on an [place].   Rare alcohol, will drink wine. No tobacco.  No illicit street drugs.  Drinks 1 caffeinated beverage daily.  Has had constipation since high school despite drinking a lot of water. NP Hassell Done has recommended Miralax daily.  EGD for dysphagia and colonoscopy for colon cancer screening in 2017 through Dr. Roney Mans [facility]. The results are not available in [place].  She had an EGD 06/09/19 that showed: - Normal esophagus. Biopsies showed reflux esophagitis. Biopsies were negative for Barrett's and EOE. - Erythematous mucosa in the antrum. Biopsies showed mild chronic gastritis. No H pylori.  - Normal examined duodenum. - The examination was otherwise normal.  Returns in scheduled follow-up. Prescribed Dexilant 60 mg once daily and famotidine 20 mg twice daily, until her insurannce wouldn't approve a refill on the 60 mg daily Dexilant. They would only allow 30 mg daily. She should avoid all NSAIDs.  Fluttering has stopped. But, she couldn't  get a refill on famotidine. Purchased the OTC famotidine and is  replacing them with 2 pills BID. Having some intermittent symptoms. Thinks she would be asymptomatic on Dexilant 60 mg daily.   Some constipation, using Miralax.   She is walking 4 miles every day.   No new complaints or concerns.   Past Medical History:  Diagnosis Date  . Anxiety   . Asthma   . Depression   . GERD (gastroesophageal reflux disease)   . Heart murmur   . Hyperlipidemia   . Insomnia     Past Surgical History:  Procedure Laterality Date  . ANKLE FRACTURE SURGERY Left    Pins and screws  . BREAST LUMPECTOMY Bilateral    left x 1, right x 2  . CARPAL TUNNEL RELEASE Right   . COLONOSCOPY    . KNEE ARTHROSCOPY Bilateral   . PARTIAL HYSTERECTOMY      Current Outpatient Medications  Medication Sig Dispense Refill  . beclomethasone (QVAR REDIHALER) 80 MCG/ACT inhaler Inhale 2 puffs into the lungs 2 (two) times daily. (Patient not taking: Reported on 05/26/2019) 10.6 g 3  . citalopram (CELEXA) 40 MG tablet TAKE 1 TABLET BY MOUTH EVERY DAY 90 tablet 1  . Dexlansoprazole 30 MG capsule Take 1 capsule (30 mg total) by mouth 2 (two) times daily. 60 capsule 3  . famotidine (PEPCID) 20 MG tablet TAKE 1 TABLET BY MOUTH TWICE A DAY 60 tablet 3  . fluconazole (DIFLUCAN) 150 MG tablet 1 po q week x 4 weeks (Patient not taking: Reported on 06/09/2019) 4 tablet 0  . fluticasone (FLONASE) 50 MCG/ACT nasal spray SPRAY 2 SPRAYS INTO EACH NOSTRIL EVERY DAY (Patient not taking: Reported on 06/09/2019) 48 mL 1  . hydrocortisone (ANUSOL-HC) 25 MG suppository PLACE 1 SUPPOSITORY (25 MG TOTAL) RECTALLY 2 (TWO) TIMES DAILY. 12 suppository 0  . LORATADINE-D 24HR 10-240 MG 24 hr tablet Take 1 tablet by mouth once daily 90 tablet 0  . LORazepam (ATIVAN) 1 MG tablet Take 1 tablet (1 mg total) by mouth every 8 (eight) hours as needed. 30 tablet 5  . polyethylene glycol powder (GLYCOLAX/MIRALAX) 17 GM/SCOOP powder Take 17 g by mouth at bedtime.    . predniSONE (STERAPRED UNI-PAK 21 TAB) 10 MG (21) TBPK  tablet As directed x 6 days 21 tablet 0  . rosuvastatin (CRESTOR) 40 MG tablet Take 1 tablet (40 mg total) by mouth daily. 90 tablet 1  . sucralfate (CARAFATE) 1 g tablet TAKE 1 TABLET (1 G TOTAL) BY MOUTH 4 (FOUR) TIMES DAILY - WITH MEALS AND AT BEDTIME. 120 tablet 2  . VENTOLIN HFA 108 (90 Base) MCG/ACT inhaler INHALE 2 PUFFS BY MOUTH EVERY 6 HOURS AS NEEDED FOR WHEEZING 18 g 0   No current facility-administered medications for this visit.    Allergies as of 07/22/2019 - Review Complete 07/04/2019  Allergen Reaction Noted  . Sulfa antibiotics Anaphylaxis 12/19/2012  . Sulfamethoxazole-trimethoprim Swelling 03/24/2014  . Sulfur Other (See Comments) 05/12/2015    Family History  Problem Relation Age of Onset  . Colon cancer Father 92  . Heart disease Mother   . Diabetes Mother   . Colon polyps Mother   . Gallstones Paternal Grandmother   . Gallbladder disease Daughter   . Esophageal cancer Neg Hx   . Stomach cancer Neg Hx   . Rectal cancer Neg Hx     Social History   Socioeconomic History  . Marital status: Married    Spouse name: Not on  file  . Number of children: 2  . Years of education: Not on file  . Highest education level: Not on file  Occupational History  . Occupation: retired  Tobacco Use  . Smoking status: Never Smoker  . Smokeless tobacco: Never Used  Substance and Sexual Activity  . Alcohol use: Yes    Comment: occasional wine  . Drug use: No  . Sexual activity: Not on file  Other Topics Concern  . Not on file  Social History Narrative  . Not on file   Social Determinants of Health   Financial Resource Strain:   . Difficulty of Paying Living Expenses:   Food Insecurity:   . Worried About Charity fundraiser in the Last Year:   . Arboriculturist in the Last Year:   Transportation Needs:   . Film/video editor (Medical):   Marland Kitchen Lack of Transportation (Non-Medical):   Physical Activity:   . Days of Exercise per Week:   . Minutes of Exercise per  Session:   Stress:   . Feeling of Stress :   Social Connections:   . Frequency of Communication with Friends and Family:   . Frequency of Social Gatherings with Friends and Family:   . Attends Religious Services:   . Active Member of Clubs or Organizations:   . Attends Archivist Meetings:   Marland Kitchen Marital Status:   Intimate Partner Violence:   . Fear of Current or Ex-Partner:   . Emotionally Abused:   Marland Kitchen Physically Abused:   . Sexually Abused:     Physical Exam: General:   Alert,  well-nourished, pleasant and cooperative in NAD Head:  Normocephalic and atraumatic. Eyes:  Sclera clear, no icterus.   Conjunctiva pink. Abdomen:  Soft,nontender, nondistended, normal bowel sounds, no rebound or guarding. No hepatosplenomegaly.   Neurologic:  Alert and  oriented x4;  grossly nonfocal Skin:  Intact without significant lesions or rashes. Psych:  Alert and cooperative. Normal mood and affect.      Loucinda Croy L. Tarri Glenn, MD, MPH 07/22/2019, 2:02 PM

## 2019-08-07 ENCOUNTER — Encounter: Payer: Self-pay | Admitting: Nurse Practitioner

## 2019-08-07 ENCOUNTER — Ambulatory Visit (INDEPENDENT_AMBULATORY_CARE_PROVIDER_SITE_OTHER): Payer: 59 | Admitting: Nurse Practitioner

## 2019-08-07 ENCOUNTER — Other Ambulatory Visit: Payer: Self-pay

## 2019-08-07 VITALS — BP 135/76 | HR 76 | Temp 97.7°F | Ht 65.0 in | Wt 189.8 lb

## 2019-08-07 DIAGNOSIS — M545 Low back pain, unspecified: Secondary | ICD-10-CM | POA: Insufficient documentation

## 2019-08-07 DIAGNOSIS — K2100 Gastro-esophageal reflux disease with esophagitis, without bleeding: Secondary | ICD-10-CM | POA: Diagnosis not present

## 2019-08-07 MED ORDER — PREDNISONE 10 MG (21) PO TBPK: ORAL_TABLET | ORAL | 0 refills | Status: DC

## 2019-08-07 MED ORDER — CYCLOBENZAPRINE HCL 5 MG PO TABS: 5.0000 mg | ORAL_TABLET | Freq: Every day | ORAL | 0 refills | Status: DC

## 2019-08-07 MED ORDER — METHYLPREDNISOLONE ACETATE 40 MG/ML IJ SUSP
40.0000 mg | Freq: Once | INTRAMUSCULAR | Status: AC
  Administered 2019-08-07: 40 mg via INTRAMUSCULAR

## 2019-08-07 NOTE — Assessment & Plan Note (Deleted)
Patient is a 64 year old female she presents with lower back pain.  For the last 7 days.  The pain is present in the lumbar spine area, pain does not radiate to her legs or anywhere else.  Patient rates pain as 5 out of 10 on a pain scale of 0-10.  Patient reports exercising more and lifting could be the cause of current back pain. Patient started 5 mg Flexeril at bedtime, Depo-Medrol 40 given in the office. Prednisone pack started. Provided education with printed handouts given to patient.  Extensive education on back strengthening exercises.  Patient knows to follow-up with uncontrolled or worsening symptoms. Rx sent to pharmacy.

## 2019-08-07 NOTE — Assessment & Plan Note (Signed)
Patient is a 64 year old female she presents with lower back pain.  For the last 7 days.  The pain is present in the lumbar spine area, pain does not radiate to her legs or anywhere else.  Patient rates pain as 5 out of 10 on a pain scale of 0-10.  Patient reports exercising more and lifting could be the cause of current back pain. Patient started 5 mg Flexeril at bedtime, Depo-Medrol 40 given in the office. Prednisone pack started. Provided education with printed handouts given to patient.  Extensive education on back strengthening exercises.  Patient knows to follow-up with uncontrolled or worsening symptoms. Rx sent to pharmacy.

## 2019-08-07 NOTE — Assessment & Plan Note (Signed)
Well controlled on current plan. No need  for medication changes. Follow up as needed.

## 2019-08-07 NOTE — Patient Instructions (Addendum)
Acute bilateral low back pain without sciatica Patient is a 64 year old female she presents with lower back pain.  For the last 7 days.  The pain is present in the lumbar spine area, pain does not radiate to her legs or anywhere else.  Patient rates pain as 5 out of 10 on a pain scale of 0-10.  Patient reports exercising more and lifting could be the cause of current back pain. Patient started 5 mg Flexeril at bedtime, Depo-Medrol 40 given in the office. Prednisone pack started. Provided education with printed handouts given to patient.  Extensive education on back strengthening exercises.  Patient knows to follow-up with uncontrolled or worsening symptoms. Rx sent to pharmacy.  GERD (gastroesophageal reflux disease) Well controlled on current plan. No need  for medication changes. Follow up as needed.   Acute Back Pain, Adult Acute back pain is sudden and usually short-lived. It is often caused by an injury to the muscles and tissues in the back. The injury may result from:  A muscle or ligament getting overstretched or torn (strained). Ligaments are tissues that connect bones to each other. Lifting something improperly can cause a back strain.  Wear and tear (degeneration) of the spinal disks. Spinal disks are circular tissue that provides cushioning between the bones of the spine (vertebrae).  Twisting motions, such as while playing sports or doing yard work.  A hit to the back.  Arthritis. You may have a physical exam, lab tests, and imaging tests to find the cause of your pain. Acute back pain usually goes away with rest and home care. Follow these instructions at home: Managing pain, stiffness, and swelling  Take over-the-counter and prescription medicines only as told by your health care provider.  Your health care provider may recommend applying ice during the first 24-48 hours after your pain starts. To do this: ? Put ice in a plastic bag. ? Place a towel between your skin  and the bag. ? Leave the ice on for 20 minutes, 2-3 times a day.  If directed, apply heat to the affected area as often as told by your health care provider. Use the heat source that your health care provider recommends, such as a moist heat pack or a heating pad. ? Place a towel between your skin and the heat source. ? Leave the heat on for 20-30 minutes. ? Remove the heat if your skin turns bright red. This is especially important if you are unable to feel pain, heat, or cold. You have a greater risk of getting burned. Activity   Do not stay in bed. Staying in bed for more than 1-2 days can delay your recovery.  Sit up and stand up straight. Avoid leaning forward when you sit, or hunching over when you stand. ? If you work at a desk, sit close to it so you do not need to lean over. Keep your chin tucked in. Keep your neck drawn back, and keep your elbows bent at a right angle. Your arms should look like the letter "L." ? Sit high and close to the steering wheel when you drive. Add lower back (lumbar) support to your car seat, if needed.  Take short walks on even surfaces as soon as you are able. Try to increase the length of time you walk each day.  Do not sit, drive, or stand in one place for more than 30 minutes at a time. Sitting or standing for long periods of time can put stress on your  back.  Do not drive or use heavy machinery while taking prescription pain medicine.  Use proper lifting techniques. When you bend and lift, use positions that put less stress on your back: ? [place] your knees. ? Keep the load close to your body. ? Avoid twisting.  Exercise regularly as told by your health care provider. Exercising helps your back heal faster and helps prevent back injuries by keeping muscles strong and flexible.  Work with a physical therapist to make a safe exercise program, as recommended by your health care provider. Do any exercises as told by your physical  therapist. Lifestyle  Maintain a healthy weight. Extra weight puts stress on your back and makes it difficult to have good posture.  Avoid activities or situations that make you feel anxious or stressed. Stress and anxiety increase muscle tension and can make back pain worse. Learn ways to manage anxiety and stress, such as through exercise. General instructions  Sleep on a firm mattress in a comfortable position. Try lying on your side with your knees slightly bent. If you lie on your back, put a pillow under your knees.  Follow your treatment plan as told by your health care provider. This may include: ? Cognitive or behavioral therapy. ? Acupuncture or massage therapy. ? Meditation or yoga. Contact a health care provider if:  You have pain that is not relieved with rest or medicine.  You have increasing pain going down into your legs or buttocks.  Your pain does not improve after 2 weeks.  You have pain at night.  You lose weight without trying.  You have a fever or chills. Get help right away if:  You develop new bowel or bladder control problems.  You have unusual weakness or numbness in your arms or legs.  You develop nausea or vomiting.  You develop abdominal pain.  You feel faint. Summary  Acute back pain is sudden and usually short-lived.  Use proper lifting techniques. When you bend and lift, use positions that put less stress on your back.  Take over-the-counter and prescription medicines and apply heat or ice as directed by your health care provider. This information is not intended to replace advice given to you by your health care provider. Make sure you discuss any questions you have with your health care provider. Document Revised: 06/11/2018 Document Reviewed: 10/04/2016 Elsevier Patient Education  [place]. Back Exercises These exercises help to make your trunk and back strong. They also help to keep the lower back flexible. Doing these  exercises can help to prevent back pain or lessen existing pain.  If you have back pain, try to do these exercises 2-3 times each day or as told by your doctor.  As you get better, do the exercises once each day. Repeat the exercises more often as told by your doctor.  To stop back pain from coming back, do the exercises once each day, or as told by your doctor. Exercises Single knee to chest Do these steps 3-5 times in a row for each leg: 1. Lie on your back on a firm bed or the floor with your legs stretched out. 2. Bring one knee to your chest. 3. Grab your knee or thigh with both hands and hold them it in place. 4. Pull on your knee until you feel a gentle stretch in your lower back or buttocks. 5. Keep doing the stretch for 10-30 seconds. 6. Slowly let go of your leg and straighten it. Pelvic tilt  Do these steps 5-10 times in a row: 1. Lie on your back on a firm bed or the floor with your legs stretched out. 2. Bend your knees so they point up to the ceiling. Your feet should be flat on the floor. 3. Tighten your lower belly (abdomen) muscles to press your lower back against the floor. This will make your tailbone point up to the ceiling instead of pointing down to your feet or the floor. 4. Stay in this position for 5-10 seconds while you gently tighten your muscles and breathe evenly. Cat-cow Do these steps until your lower back bends more easily: 1. Get on your hands and knees on a firm surface. Keep your hands under your shoulders, and keep your knees under your hips. You may put padding under your knees. 2. Let your head hang down toward your chest. Tighten (contract) the muscles in your belly. Point your tailbone toward the floor so your lower back becomes rounded like the back of a cat. 3. Stay in this position for 5 seconds. 4. Slowly lift your head. Let the muscles of your belly relax. Point your tailbone up toward the ceiling so your back forms a sagging arch like the back  of a cow. 5. Stay in this position for 5 seconds.  Press-ups Do these steps 5-10 times in a row: 1. Lie on your belly (face-down) on the floor. 2. Place your hands near your head, about shoulder-width apart. 3. While you keep your back relaxed and keep your hips on the floor, slowly straighten your arms to raise the top half of your body and lift your shoulders. Do not use your back muscles. You may change where you place your hands in order to make yourself more comfortable. 4. Stay in this position for 5 seconds. 5. Slowly return to lying flat on the floor.  Bridges Do these steps 10 times in a row: 1. Lie on your back on a firm surface. 2. Bend your knees so they point up to the ceiling. Your feet should be flat on the floor. Your arms should be flat at your sides, next to your body. 3. Tighten your butt muscles and lift your butt off the floor until your waist is almost as high as your knees. If you do not feel the muscles working in your butt and the back of your thighs, slide your feet 1-2 inches farther away from your butt. 4. Stay in this position for 3-5 seconds. 5. Slowly lower your butt to the floor, and let your butt muscles relax. If this exercise is too easy, try doing it with your arms crossed over your chest. Belly crunches Do these steps 5-10 times in a row: 1. Lie on your back on a firm bed or the floor with your legs stretched out. 2. Bend your knees so they point up to the ceiling. Your feet should be flat on the floor. 3. Cross your arms over your chest. 4. Tip your chin a little bit toward your chest but do not bend your neck. 5. Tighten your belly muscles and slowly raise your chest just enough to lift your shoulder blades a tiny bit off of the floor. Avoid raising your body higher than that, because it can put too much stress on your low back. 6. Slowly lower your chest and your head to the floor. Back lifts Do these steps 5-10 times in a row: 1. Lie on your belly  (face-down) with your arms at your sides,  and rest your forehead on the floor. 2. Tighten the muscles in your legs and your butt. 3. Slowly lift your chest off of the floor while you keep your hips on the floor. Keep the back of your head in line with the curve in your back. Look at the floor while you do this. 4. Stay in this position for 3-5 seconds. 5. Slowly lower your chest and your face to the floor. Contact a doctor if:  Your back pain gets a lot worse when you do an exercise.  Your back pain does not get better 2 hours after you exercise. If you have any of these problems, stop doing the exercises. Do not do them again unless your doctor says it is okay. Get help right away if:  You have sudden, very bad back pain. If this happens, stop doing the exercises. Do not do them again unless your doctor says it is okay. This information is not intended to replace advice given to you by your health care provider. Make sure you discuss any questions you have with your health care provider. Document Revised: 11/15/2017 Document Reviewed: 11/15/2017 Elsevier Patient Education  [place].  Gastroesophageal Reflux Disease, Adult Gastroesophageal reflux (GER) happens when acid from the stomach flows up into the tube that connects the mouth and the stomach (esophagus). Normally, food travels down the esophagus and stays in the stomach to be digested. With GER, food and stomach acid sometimes move back up into the esophagus. You may have a disease called gastroesophageal reflux disease (GERD) if the reflux:  Happens often.  Causes frequent or very bad symptoms.  Causes problems such as damage to the esophagus. When this happens, the esophagus becomes sore and swollen (inflamed). Over time, GERD can make small holes (ulcers) in the lining of the esophagus. What are the causes? This condition is caused by a problem with the muscle between the esophagus and the stomach. When this muscle is  weak or not normal, it does not close properly to keep food and acid from coming back up from the stomach. The muscle can be weak because of:  Tobacco use.  Pregnancy.  Having a certain type of hernia (hiatal hernia).  Alcohol use.  Certain foods and drinks, such as coffee, chocolate, onions, and peppermint. What increases the risk? You are more likely to develop this condition if you:  Are overweight.  Have a disease that affects your connective tissue.  Use NSAID medicines. What are the signs or symptoms? Symptoms of this condition include:  Heartburn.  Difficult or painful swallowing.  The feeling of having a lump in the throat.  A bitter taste in the mouth.  Bad breath.  Having a lot of saliva.  Having an upset or bloated stomach.  Belching.  Chest pain. Different conditions can cause chest pain. Make sure you see your doctor if you have chest pain.  Shortness of breath or noisy breathing (wheezing).  Ongoing (chronic) cough or a cough at night.  Wearing away of the surface of teeth (tooth enamel).  Weight loss. How is this treated? Treatment will depend on how bad your symptoms are. Your doctor may suggest:  Changes to your diet.  Medicine.  Surgery. Follow these instructions at home: Eating and drinking   Follow a diet as told by your doctor. You may need to avoid foods and drinks such as: ? Coffee and tea (with or without caffeine). ? Drinks that contain alcohol. ? Energy drinks and sports drinks. ?  Bubbly (carbonated) drinks or sodas. ? Chocolate and cocoa. ? Peppermint and mint flavorings. ? Garlic and onions. ? Horseradish. ? Spicy and acidic foods. These include peppers, chili powder, curry powder, vinegar, hot sauces, and BBQ sauce. ? Citrus fruit juices and citrus fruits, such as oranges, lemons, and limes. ? Tomato-based foods. These include red sauce, chili, salsa, and pizza with red sauce. ? Fried and fatty foods. These include  donuts, french fries, potato chips, and high-fat dressings. ? High-fat meats. These include hot dogs, rib eye steak, sausage, ham, and bacon. ? High-fat dairy items, such as whole milk, butter, and cream cheese.  Eat small meals often. Avoid eating large meals.  Avoid drinking large amounts of liquid with your meals.  Avoid eating meals during the 2-3 hours before bedtime.  Avoid lying down right after you eat.  Do not exercise right after you eat. Lifestyle   Do not use any products that contain nicotine or tobacco. These include cigarettes, e-cigarettes, and chewing tobacco. If you need help quitting, ask your doctor.  Try to lower your stress. If you need help doing this, ask your doctor.  If you are overweight, lose an amount of weight that is healthy for you. Ask your doctor about a safe weight loss goal. General instructions  Pay attention to any changes in your symptoms.  Take over-the-counter and prescription medicines only as told by your doctor. Do not take aspirin, ibuprofen, or other NSAIDs unless your doctor says it is okay.  Wear loose clothes. Do not wear anything tight around your waist.  Raise (elevate) the head of your bed about 6 inches (15 cm).  Avoid bending over if this makes your symptoms worse.  Keep all follow-up visits as told by your doctor. This is important. Contact a doctor if:  You have new symptoms.  You lose weight and you do not know why.  You have trouble swallowing or it hurts to swallow.  You have wheezing or a cough that keeps happening.  Your symptoms do not get better with treatment.  You have a hoarse voice. Get help right away if:  You have pain in your arms, neck, jaw, teeth, or back.  You feel sweaty, dizzy, or light-headed.  You have chest pain or shortness of breath.  You throw up (vomit) and your throw-up looks like blood or coffee grounds.  You pass out (faint).  Your poop (stool) is bloody or black.  You  cannot swallow, drink, or eat. Summary  If a person has gastroesophageal reflux disease (GERD), food and stomach acid move back up into the esophagus and cause symptoms or problems such as damage to the esophagus.  Treatment will depend on how bad your symptoms are.  Follow a diet as told by your doctor.  Take all medicines only as told by your doctor. This information is not intended to replace advice given to you by your health care provider. Make sure you discuss any questions you have with your health care provider. Document Revised: 08/29/2017 Document Reviewed: 08/29/2017 Elsevier Patient Education  [place].

## 2019-08-07 NOTE — Progress Notes (Signed)
Acute Office Visit  Subjective:    Patient ID: Crystal Wong, female    DOB: 1956-02-24, 64 y.o.   MRN: [id number]  Chief Complaint  Patient presents with  . Back Pain    lower x 1 week    Back Pain This is a new problem. The current episode started in the past 7 days. The problem occurs daily. The problem is unchanged. The pain is present in the lumbar spine. The quality of the pain is described as aching. The pain does not radiate. The pain is at a severity of 5/10. The pain is moderate. The pain is worse during the day. The symptoms are aggravated by position and bending. Stiffness is present in the morning. Pertinent negatives include no bladder incontinence, pelvic pain or weakness. She has tried analgesics for the symptoms. The treatment provided mild relief.   Patient is in today for  GERD: patient report no new concerns.  Patient was diagnosed in 2014. The patient is tolerating the medication well without side effects. Compliance with treatment has been good; including taking medication as directed , maintains a healthy diet and regular exercise regimen , and following up as directed.  Past Medical History:  Diagnosis Date  . Anxiety   . Asthma   . Depression   . GERD (gastroesophageal reflux disease)   . Heart murmur   . Hyperlipidemia   . Insomnia     Past Surgical History:  Procedure Laterality Date  . ANKLE FRACTURE SURGERY Left    Pins and screws  . BREAST LUMPECTOMY Bilateral    left x 1, right x 2  . CARPAL TUNNEL RELEASE Right   . CARPAL TUNNEL RELEASE Left   . COLONOSCOPY    . KNEE ARTHROSCOPY Bilateral   . PARTIAL HYSTERECTOMY      Family History  Problem Relation Age of Onset  . Colon cancer Father 18  . Heart disease Mother   . Diabetes Mother   . Colon polyps Mother   . Gallstones Paternal Grandmother   . Gallbladder disease Daughter   . Esophageal cancer Neg Hx   . Stomach cancer Neg Hx   . Rectal cancer Neg Hx    Social History    Socioeconomic History  . Marital status: Married    Spouse name: Not on file  . Number of children: 2  . Years of education: Not on file  . Highest education level: Not on file  Occupational History  . Occupation: retired  Tobacco Use  . Smoking status: Never Smoker  . Smokeless tobacco: Never Used  Substance and Sexual Activity  . Alcohol use: Yes    Comment: occasional wine  . Drug use: No  . Sexual activity: Not on file  Other Topics Concern  . Not on file  Social History Narrative  . Not on file   Social Determinants of Health   Financial Resource Strain:   . Difficulty of Paying Living Expenses:   Food Insecurity:   . Worried About Charity fundraiser in the Last Year:   . Arboriculturist in the Last Year:   Transportation Needs:   . Film/video editor (Medical):   Marland Kitchen Lack of Transportation (Non-Medical):   Physical Activity:   . Days of Exercise per Week:   . Minutes of Exercise per Session:   Stress:   . Feeling of Stress :   Social Connections:   . Frequency of Communication with Friends and Family:   .  Frequency of Social Gatherings with Friends and Family:   . Attends Religious Services:   . Active Member of Clubs or Organizations:   . Attends Archivist Meetings:   Marland Kitchen Marital Status:   Intimate Partner Violence:   . Fear of Current or Ex-Partner:   . Emotionally Abused:   Marland Kitchen Physically Abused:   . Sexually Abused:     Outpatient Medications Prior to Visit  Medication Sig Dispense Refill  . Ascorbic Acid (VITAMIN C ADULT GUMMIES PO) Take by mouth.    . beclomethasone (QVAR REDIHALER) 80 MCG/ACT inhaler Inhale 2 puffs into the lungs 2 (two) times daily. 10.6 g 3  . citalopram (CELEXA) 40 MG tablet TAKE 1 TABLET BY MOUTH EVERY DAY 90 tablet 1  . dexlansoprazole (DEXILANT) 60 MG capsule Take 1 capsule (60 mg total) by mouth daily. 30 capsule 3  . famotidine (PEPCID) 20 MG tablet TAKE 1 TABLET BY MOUTH TWICE A DAY 60 tablet 3  .  fluconazole (DIFLUCAN) 150 MG tablet 1 po q week x 4 weeks 4 tablet 0  . fluticasone (FLONASE) 50 MCG/ACT nasal spray SPRAY 2 SPRAYS INTO EACH NOSTRIL EVERY DAY 48 mL 1  . hydrocortisone (ANUSOL-HC) 25 MG suppository PLACE 1 SUPPOSITORY (25 MG TOTAL) RECTALLY 2 (TWO) TIMES DAILY. 12 suppository 0  . LORATADINE-D 24HR 10-240 MG 24 hr tablet Take 1 tablet by mouth once daily 90 tablet 0  . LORazepam (ATIVAN) 1 MG tablet Take 1 tablet (1 mg total) by mouth every 8 (eight) hours as needed. 30 tablet 5  . polyethylene glycol powder (GLYCOLAX/MIRALAX) 17 GM/SCOOP powder Take 17 g by mouth at bedtime.    . Pyridoxine HCl (VITAMIN B6 PO) Take by mouth.    . rosuvastatin (CRESTOR) 40 MG tablet Take 1 tablet (40 mg total) by mouth daily. 90 tablet 1  . sucralfate (CARAFATE) 1 g tablet TAKE 1 TABLET (1 G TOTAL) BY MOUTH 4 (FOUR) TIMES DAILY - WITH MEALS AND AT BEDTIME. 120 tablet 2  . VENTOLIN HFA 108 (90 Base) MCG/ACT inhaler INHALE 2 PUFFS BY MOUTH EVERY 6 HOURS AS NEEDED FOR WHEEZING 18 g 0  . VITAMIN D, CHOLECALCIFEROL, PO Take by mouth.    . Dexlansoprazole (DEXILANT) 30 MG capsule Dexilant 30 mg capsule, delayed release   1 capsule every day by oral route.    . naproxen (NAPROSYN) 500 MG tablet naproxen 500 mg tablet   1 tablet twice a day by oral route.    . cephALEXin (KEFLEX) 500 MG capsule Take 500 mg by mouth 4 (four) times daily.    Marland Kitchen HYDROcodone-acetaminophen (NORCO/VICODIN) 5-325 MG tablet     . predniSONE (STERAPRED UNI-PAK 21 TAB) 10 MG (21) TBPK tablet As directed x 6 days 21 tablet 0   No facility-administered medications prior to visit.    Allergies  Allergen Reactions  . Sulfa Antibiotics Anaphylaxis  . Sulfamethoxazole-Trimethoprim Swelling  . Sulfur Other (See Comments)    Other reaction(s): Angioedema (ALLERGY/intolerance) Other reaction(s): Angioedema (ALLERGY/intolerance)    Review of Systems  Constitutional: Negative.   HENT: Negative.   Eyes: Negative.    Respiratory: Negative.   Cardiovascular: Negative.   Genitourinary: Negative for bladder incontinence and pelvic pain.  Musculoskeletal: Positive for arthralgias and back pain.  Skin: Negative for rash and wound.  Neurological: Negative for weakness.  Psychiatric/Behavioral: The patient is not nervous/anxious.        Objective:    Physical Exam Constitutional:      Appearance: Normal  appearance.  HENT:     Head: Normocephalic.  Eyes:     Conjunctiva/sclera: Conjunctivae normal.  Cardiovascular:     Rate and Rhythm: Normal rate and regular rhythm.     Pulses: Normal pulses.     Heart sounds: Normal heart sounds.  Pulmonary:     Breath sounds: Normal breath sounds.  Abdominal:     General: Bowel sounds are normal.  Musculoskeletal:        General: Tenderness present.     Cervical back: Neck supple.     Comments: Bilateral lower back pain  Skin:    Findings: No erythema or rash.  Neurological:     Mental Status: She is alert.     BP 135/76   Pulse 76   Temp 97.7 F (36.5 C)   Ht 5\' 5"  (1.651 m)   Wt 189 lb 12.8 oz (86.1 kg)   SpO2 98%   BMI 31.58 kg/m  Wt Readings from Last 3 Encounters:  08/07/19 189 lb 12.8 oz (86.1 kg)  07/22/19 184 lb 6 oz (83.6 kg)  06/09/19 193 lb (87.5 kg)    Health Maintenance Due  Topic Date Due  . COVID-19 Vaccine (1) Never done  . TETANUS/TDAP  05/06/2019  . MAMMOGRAM  06/05/2019    There are no preventive care reminders to display for this patient.   Lab Results  Component Value Date   TSH 1.610 08/26/2018   Lab Results  Component Value Date   WBC 4.9 08/26/2018   HGB 13.2 08/26/2018   HCT 39.4 08/26/2018   MCV 86 08/26/2018   PLT 263 08/26/2018   Lab Results  Component Value Date   NA 140 02/20/2019   K 4.9 02/20/2019   CO2 25 02/20/2019   GLUCOSE 119 (H) 02/20/2019   BUN 18 02/20/2019   CREATININE 0.75 02/20/2019   BILITOT 0.4 02/20/2019   ALKPHOS 92 02/20/2019   AST 20 02/20/2019   ALT 20 02/20/2019    PROT 6.6 02/20/2019   ALBUMIN 4.2 02/20/2019   CALCIUM 10.1 02/20/2019   Lab Results  Component Value Date   CHOL 219 (H) 02/20/2019   Lab Results  Component Value Date   HDL 47 02/20/2019   Lab Results  Component Value Date   LDLCALC 152 (H) 02/20/2019   Lab Results  Component Value Date   TRIG 111 02/20/2019   Lab Results  Component Value Date   CHOLHDL 4.7 (H) 02/20/2019   Lab Results  Component Value Date   HGBA1C 6.4 02/24/2019       Assessment & Plan:   Problem List Items Addressed This Visit      Digestive   GERD (gastroesophageal reflux disease)    Well controlled on current plan. No need  for medication changes. Follow up as needed.        Other   Acute bilateral low back pain without sciatica - Primary    Patient is a 64 year old female she presents with lower back pain.  For the last 7 days.  The pain is present in the lumbar spine area, pain does not radiate to her legs or anywhere else.  Patient rates pain as 5 out of 10 on a pain scale of 0-10.  Patient reports exercising more and lifting could be the cause of current back pain. Patient started 5 mg Flexeril at bedtime, Depo-Medrol 40 given in the office. Prednisone pack started. Provided education with printed handouts given to patient.  Extensive education on back strengthening  exercises.  Patient knows to follow-up with uncontrolled or worsening symptoms. Rx sent to pharmacy.      Relevant Medications   predniSONE (STERAPRED UNI-PAK 21 TAB) 10 MG (21) TBPK tablet   cyclobenzaprine (FLEXERIL) 5 MG tablet       Meds ordered this encounter  Medications  . predniSONE (STERAPRED UNI-PAK 21 TAB) 10 MG (21) TBPK tablet    Sig: 60 mg by mouth day1, 50 mg day 2, 40 mg day 3, 30 mg day 4, 20 mg day 5, 10 mg day 6    Dispense:  1 each    Refill:  0    Order Specific Question:   Supervising Provider    Answer:   Caryl Pina A [id number]  . methylPREDNISolone acetate (DEPO-MEDROL) injection  40 mg  . cyclobenzaprine (FLEXERIL) 5 MG tablet    Sig: Take 1 tablet (5 mg total) by mouth at bedtime. As needed    Dispense:  30 tablet    Refill:  0    Order Specific Question:   Supervising Provider    Answer:   Caryl Pina A [id number]     Ivy Lynn, NP

## 2019-08-11 ENCOUNTER — Other Ambulatory Visit: Payer: Self-pay

## 2019-08-11 ENCOUNTER — Encounter: Payer: Self-pay | Admitting: Dermatology

## 2019-08-11 ENCOUNTER — Ambulatory Visit: Payer: 59 | Admitting: Dermatology

## 2019-08-11 DIAGNOSIS — D225 Melanocytic nevi of trunk: Secondary | ICD-10-CM | POA: Diagnosis not present

## 2019-08-11 DIAGNOSIS — D1801 Hemangioma of skin and subcutaneous tissue: Secondary | ICD-10-CM

## 2019-08-11 DIAGNOSIS — B078 Other viral warts: Secondary | ICD-10-CM

## 2019-08-11 DIAGNOSIS — L821 Other seborrheic keratosis: Secondary | ICD-10-CM

## 2019-08-11 DIAGNOSIS — Z1283 Encounter for screening for malignant neoplasm of skin: Secondary | ICD-10-CM

## 2019-08-11 DIAGNOSIS — D229 Melanocytic nevi, unspecified: Secondary | ICD-10-CM

## 2019-08-11 NOTE — Progress Notes (Addendum)
   New Patient   Subjective  Crystal Wong is a 64 y.o. female who presents for the following: Annual Exam (Concerns check spots back of neck, forehead, right pinky finger crusy spots. Also check spots on butock area.  No history of skin cancer.).  New crust Location: Scalp Duration:  Quality: Cannot find today Associated Signs/Symptoms: Modifying Factors:  Severity:  Timing: Context: Wants general skin check   The following portions of the chart were reviewed this encounter and updated as appropriate: Tobacco  Allergies  Meds  Problems  Med Hx  Surg Hx  Fam Hx      Objective  Well appearing patient in no apparent distress; mood and affect are within normal limits.  A full examination was performed including scalp, head, eyes, ears, nose, lips, neck, chest, axillae, abdomen, back, buttocks, bilateral upper extremities, bilateral lower extremities, hands, feet, fingers, toes, fingernails, and toenails. All findings within normal limits unless otherwise noted below.   Assessment & Plan  Nevus Mid Back  Annual skin examination  Seborrheic keratosis (2) Left Thigh - Anterior; Left Lower Back  Leave if stable  Other viral warts Right Palmar Middle 4th Finger  Destruction of lesion - Right Palmar Middle 4th Finger Complexity: simple   Destruction method: cryotherapy   Informed consent: discussed and consent obtained   Timeout:  patient name, date of birth, surgical site, and procedure verified Lesion destroyed using liquid nitrogen: Yes   Region frozen until ice ball extended beyond lesion: Yes   Cryotherapy cycles:  5 Outcome: patient tolerated procedure well with no complications   Follow up visit for Crystal Wong, date of birth Dec 08, 1955.  Skin examination from feet to scalp; there were no atypical moles, melanoma, or nonmole skin cancer.  Clinically benign lesions included textured tan keratoses on her back and her left thigh (1 on the scalp could not be found  today).  Multiple tiny red dots that are cherry angiomas.  Tag mole on her right inner buttocks.  A firm 64mm papule on her right outer buttock that is likely a dermatofibroma.  A possible wart on the right ring finger that bothers Crystal Wong; liquid nitrogen freeze done, and she could follow this up with any over-the-counter home freezing.  Macular pigmented dot on her left foot which resulted from a minor injury showed no atypical dermoscopic features Skin cancer screening performed today. Marland Kitchen

## 2019-08-17 NOTE — Progress Notes (Signed)
Cardiology Office Note:    Date:  08/20/2019   ID:  [name], DOB Dec 01, 1955, MRN [id number]  PCP:  Chevis Pretty, FNP  Cardiologist:  Donato Heinz, MD  Electrophysiologist:  None   Referring MD: Chevis Pretty, *   Chief Complaint  Patient presents with   Palpitations    History of Present Illness:    Crystal Wong is a 64 y.o. female with a hx of anxiety, depression, asthma, GERD, hyperlipidemia who presents for follow-up.  She was referred by Dr. Tarri Glenn for pre-op evaluation, initially seen on 05/19/2019.  She was referred to gastroenterology for EGD, requested cardiac evaluation prior to procedure.  She reports that she has been having a fluttering feeling in her chest that started in January.  Was occurring every day, but has not had for the last week.  Can last for 20 minutes up to hours.  In addition to chest fluttering, she sometimes avoids episodes where she feels like her heart is racing, but states that this is related to caffeine use.  Has also been having burning in her chest that she has attributed to GERD.  Has improved with increasing her GERD medications.  Reports that she exercises regularly, walks 4 miles per day and does Pilates for 30 minutes.  Denies any exertional chest pain.  Does report some dyspnea with walking up hills.  Never smoked.  Mother had MI at 17.   Reports she was told years ago she had mitral valve prolapse after she was evaluated for heart murmur  At initial clinic visit on 05/19/2019, no further work-up was recommended prior to her EGD, which he underwent on 06/09/2019.  TTE on 06/03/2019 showed LVEF 60 to 65%, normal RV function, mild aortic stenosis, mild to moderate aortic regurgitation.  Zio patch x14 days on 07/17/2019 showed no significant arrhythmias.  Calcium score on 06/03/2019 was 0.  Since last clinic visit, states that she has had one episode where she felt like her heart was fluttering.  She has been seen by GI, felt  to likely have esophageal spasm.  She has been walking 2 miles per day, denies any chest pain or dyspnea.  Denies any lightheadedness, syncope, or lower extremity edema.   Past Medical History:  Diagnosis Date   Anxiety    Asthma    Depression    GERD (gastroesophageal reflux disease)    Heart murmur    Hyperlipidemia    Insomnia     Past Surgical History:  Procedure Laterality Date   ANKLE FRACTURE SURGERY Left    Pins and screws   BREAST LUMPECTOMY Bilateral    left x 1, right x 2   CARPAL TUNNEL RELEASE Right    CARPAL TUNNEL RELEASE Left    COLONOSCOPY     KNEE ARTHROSCOPY Bilateral    PARTIAL HYSTERECTOMY      Current Medications: Current Meds  Medication Sig   Ascorbic Acid (VITAMIN C ADULT GUMMIES PO) Take by mouth.   beclomethasone (QVAR REDIHALER) 80 MCG/ACT inhaler Inhale 2 puffs into the lungs 2 (two) times daily.   citalopram (CELEXA) 40 MG tablet TAKE 1 TABLET BY MOUTH EVERY DAY   cyclobenzaprine (FLEXERIL) 5 MG tablet Take 1 tablet (5 mg total) by mouth at bedtime. As needed   dexlansoprazole (DEXILANT) 60 MG capsule Take 1 capsule (60 mg total) by mouth daily.   famotidine (PEPCID) 20 MG tablet TAKE 1 TABLET BY MOUTH TWICE A DAY   fluconazole (DIFLUCAN) 150 MG tablet  1 po q week x 4 weeks   fluticasone (FLONASE) 50 MCG/ACT nasal spray SPRAY 2 SPRAYS INTO EACH NOSTRIL EVERY DAY   hydrocortisone (ANUSOL-HC) 25 MG suppository PLACE 1 SUPPOSITORY (25 MG TOTAL) RECTALLY 2 (TWO) TIMES DAILY.   LORATADINE-D 24HR 10-240 MG 24 hr tablet Take 1 tablet by mouth once daily   LORazepam (ATIVAN) 1 MG tablet Take 1 tablet (1 mg total) by mouth every 8 (eight) hours as needed.   polyethylene glycol powder (GLYCOLAX/MIRALAX) 17 GM/SCOOP powder Take 17 g by mouth at bedtime.   Pyridoxine HCl (VITAMIN B6 PO) Take by mouth.   rosuvastatin (CRESTOR) 40 MG tablet Take 1 tablet (40 mg total) by mouth daily.   sucralfate (CARAFATE) 1 g tablet TAKE 1  TABLET (1 G TOTAL) BY MOUTH 4 (FOUR) TIMES DAILY - WITH MEALS AND AT BEDTIME.   VENTOLIN HFA 108 (90 Base) MCG/ACT inhaler INHALE 2 PUFFS BY MOUTH EVERY 6 HOURS AS NEEDED FOR WHEEZING   VITAMIN D, CHOLECALCIFEROL, PO Take by mouth.     Allergies:   Sulfa antibiotics, Sulfamethoxazole-trimethoprim, and Sulfur   Social History   Socioeconomic History   Marital status: Married    Spouse name: Not on file   Number of children: 2   Years of education: Not on file   Highest education level: Not on file  Occupational History   Occupation: retired  Tobacco Use   Smoking status: Never Smoker   Smokeless tobacco: Never Used  Scientific laboratory technician Use: Never used  Substance and Sexual Activity   Alcohol use: Yes    Comment: occasional wine   Drug use: No   Sexual activity: Not on file  Other Topics Concern   Not on file  Social History Narrative   Not on file   Social Determinants of Health   Financial Resource Strain:    Difficulty of Paying Living Expenses:   Food Insecurity:    Worried About Charity fundraiser in the Last Year:    Arboriculturist in the Last Year:   Transportation Needs:    Film/video editor (Medical):    Lack of Transportation (Non-Medical):   Physical Activity:    Days of Exercise per Week:    Minutes of Exercise per Session:   Stress:    Feeling of Stress :   Social Connections:    Frequency of Communication with Friends and Family:    Frequency of Social Gatherings with Friends and Family:    Attends Religious Services:    Active Member of Clubs or Organizations:    Attends Music therapist:    Marital Status:      Family History: The patient's family history includes Colon cancer (age of onset: 40) in her father; Colon polyps in her mother; Diabetes in her mother; Gallbladder disease in her daughter; Gallstones in her paternal grandmother; Heart disease in her mother. There is no history of Esophageal  cancer, Stomach cancer, or Rectal cancer.  ROS:   Please see the history of present illness.     All other systems reviewed and are negative.  EKGs/Labs/Other Studies Reviewed:    The following studies were reviewed today:   EKG:  EKG is not ordered today.  The ekg ordered most recently demonstrates normal sinus rhythm, rate 77, no ST/T wave abnormalities  Recent Labs: 08/26/2018: Hemoglobin 13.2; Platelets 263; TSH 1.610 02/20/2019: ALT 20; BUN 18; Creatinine, Ser 0.75; Potassium 4.9; Sodium 140  Recent Lipid Panel  Component Value Date/Time   CHOL 219 (H) 02/20/2019 1104   CHOL 225 (H) 08/23/2012 1155   TRIG 111 02/20/2019 1104   TRIG 125 12/23/2013 1004   TRIG 51 08/23/2012 1155   HDL 47 02/20/2019 1104   HDL 58 12/23/2013 1004   HDL 59 08/23/2012 1155   CHOLHDL 4.7 (H) 02/20/2019 1104   LDLCALC 152 (H) 02/20/2019 1104   LDLCALC 163 (H) 12/23/2013 1004   LDLCALC 156 (H) 08/23/2012 1155    Physical Exam:    VS:  BP 120/70    Pulse 77    Temp (!) 97.2 F (36.2 C)    Ht 5\' 6"  (1.676 m)    Wt 190 lb (86.2 kg)    SpO2 98%    BMI 30.67 kg/m     Wt Readings from Last 3 Encounters:  08/19/19 190 lb (86.2 kg)  08/07/19 189 lb 12.8 oz (86.1 kg)  07/22/19 184 lb 6 oz (83.6 kg)     GEN:  Well nourished, well developed in no acute distress HEENT: Normal NECK: No JVD; No carotid bruits LYMPHATICS: No lymphadenopathy CARDIAC: RRR, 2/6 systolic hear murmur RESPIRATORY:  Clear to auscultation without rales, wheezing or rhonchi  ABDOMEN: Soft, non-tender, non-distended MUSCULOSKELETAL:  No edema; No deformity  SKIN: Warm and dry NEUROLOGIC:  Alert and oriented x 3 PSYCHIATRIC:  Normal affect   ASSESSMENT:    1. Palpitations   2. Hyperlipidemia, unspecified hyperlipidemia type   3. Mild aortic stenosis   4. Aortic valve insufficiency, etiology of cardiac valve disease unspecified    PLAN:     Palpitations: Zio patch x14 days on 07/17/2019 showed no significant  arrhythmias.  Improving.  Aortic stenosis/regurgitation:  TTE on 06/03/2019 showed LVEF 60 to 65%, normal RV function, mild aortic stenosis, mild to moderate aortic regurgitation.  Will monitor with repeat echo in 1 year  Hyperlipidemia: On rosuvastatin 40 mg daily.  LDL 172 on 08/26/2018.  Calcium score 0 on 06/03/2019.  Will recheck lipid panel  RTC in 1 year    Medication Adjustments/Labs and Tests Ordered: Current medicines are reviewed at length with the patient today.  Concerns regarding medicines are outlined above.  Orders Placed This Encounter  Procedures   Lipid panel   No orders of the defined types were placed in this encounter.   Patient Instructions  Medication Instructions:  No Changes *If you need a refill on your cardiac medications before your next appointment, please call your pharmacy*   Lab Work: Lipid Panel If you have labs (blood work) drawn today and your tests are completely normal, you will receive your results only by:  [facility] (if you have MyChart) OR  A paper copy in the mail If you have any lab test that is abnormal or we need to change your treatment, we will call you to review the results.   Testing/Procedures: Your physician has requested that you have an echocardiogram. Due April 2022. Echocardiography is a painless test that uses sound waves to create images of your heart. It provides your doctor with information about the size and shape of your heart and how well your hearts chambers and valves are working. This procedure takes approximately one hour. There are no restrictions for this procedure.    Follow-Up: At [facility], you and your health needs are our priority.  As part of our continuing mission to provide you with exceptional heart care, we have created designated Provider Care Teams.  These Care Teams include your  primary Cardiologist (physician) and Advanced Practice Providers (APPs -  Physician Assistants and Nurse  Practitioners) who all work together to provide you with the care you need, when you need it.  Your next appointment:   1 year(s)  The format for your next appointment:   In Person  Provider:   Oswaldo Milian, MD   Other Instructions Referred to care guide for nutrition.    Signed, Donato Heinz, MD  08/20/2019 12:10 AM    [place]

## 2019-08-18 ENCOUNTER — Encounter: Payer: Self-pay | Admitting: Dermatology

## 2019-08-19 ENCOUNTER — Encounter: Payer: Self-pay | Admitting: Cardiology

## 2019-08-19 ENCOUNTER — Ambulatory Visit: Payer: 59 | Admitting: Cardiology

## 2019-08-19 ENCOUNTER — Other Ambulatory Visit: Payer: Self-pay

## 2019-08-19 VITALS — BP 120/70 | HR 77 | Temp 97.2°F | Ht 66.0 in | Wt 190.0 lb

## 2019-08-19 DIAGNOSIS — I351 Nonrheumatic aortic (valve) insufficiency: Secondary | ICD-10-CM

## 2019-08-19 DIAGNOSIS — E785 Hyperlipidemia, unspecified: Secondary | ICD-10-CM | POA: Diagnosis not present

## 2019-08-19 DIAGNOSIS — I350 Nonrheumatic aortic (valve) stenosis: Secondary | ICD-10-CM

## 2019-08-19 DIAGNOSIS — R002 Palpitations: Secondary | ICD-10-CM

## 2019-08-19 NOTE — Patient Instructions (Signed)
Medication Instructions:  No Changes *If you need a refill on your cardiac medications before your next appointment, please call your pharmacy*   Lab Work: Lipid Panel If you have labs (blood work) drawn today and your tests are completely normal, you will receive your results only by: Marland Kitchen MyChart Message (if you have MyChart) OR . A paper copy in the mail If you have any lab test that is abnormal or we need to change your treatment, we will call you to review the results.   Testing/Procedures: Your physician has requested that you have an echocardiogram. Due April 2022. Echocardiography is a painless test that uses sound waves to create images of your heart. It provides your doctor with information about the size and shape of your heart and how well your heart's chambers and valves are working. This procedure takes approximately one hour. There are no restrictions for this procedure.    Follow-Up: At [facility], you and your health needs are our priority.  As part of our continuing mission to provide you with exceptional heart care, we have created designated Provider Care Teams.  These Care Teams include your primary Cardiologist (physician) and Advanced Practice Providers (APPs -  Physician Assistants and Nurse Practitioners) who all work together to provide you with the care you need, when you need it.  Your next appointment:   1 year(s)  The format for your next appointment:   In Person  Provider:   Oswaldo Milian, MD   Other Instructions Referred to care guide for nutrition.

## 2019-08-20 LAB — LIPID PANEL
Chol/HDL Ratio: 4.4 ratio (ref 0.0–4.4)
Cholesterol, Total: 292 mg/dL — ABNORMAL HIGH (ref 100–199)
HDL: 66 mg/dL (ref >39)
LDL Chol Calc (NIH): 205 mg/dL — ABNORMAL HIGH (ref 0–99)
Triglycerides: 120 mg/dL (ref 0–149)
VLDL Cholesterol Cal: 21 mg/dL (ref 5–40)

## 2019-08-21 ENCOUNTER — Other Ambulatory Visit: Payer: Self-pay | Admitting: Nurse Practitioner

## 2019-08-21 ENCOUNTER — Ambulatory Visit: Payer: Self-pay | Admitting: Nurse Practitioner

## 2019-08-21 ENCOUNTER — Telehealth: Payer: Self-pay

## 2019-08-21 DIAGNOSIS — F411 Generalized anxiety disorder: Secondary | ICD-10-CM

## 2019-08-21 NOTE — Telephone Encounter (Signed)
Called patient to discuss issues around nutrition assistance. Patient informed Care Guide that they are interested in seeing a nutritionist because they do not know what to eat when it comes to their GRD and high cholesterol.  Care Guide will collaborate with Dr. Gardiner Rhyme to coordinate a referral for a nutritionist. Additionally, Care Guide will provide coaching for the patient. Will call back on 08/22/19.

## 2019-08-22 ENCOUNTER — Other Ambulatory Visit: Payer: Self-pay | Admitting: *Deleted

## 2019-08-22 ENCOUNTER — Other Ambulatory Visit: Payer: Self-pay

## 2019-08-22 ENCOUNTER — Telehealth: Payer: Self-pay

## 2019-08-22 ENCOUNTER — Telehealth: Payer: Self-pay | Admitting: Nurse Practitioner

## 2019-08-22 DIAGNOSIS — F411 Generalized anxiety disorder: Secondary | ICD-10-CM

## 2019-08-22 DIAGNOSIS — E785 Hyperlipidemia, unspecified: Secondary | ICD-10-CM

## 2019-08-22 NOTE — Telephone Encounter (Signed)
Patient had an appointment scheduled 08/21/2019 but it was cancelled and rescheduled for 08/28/2019.

## 2019-08-22 NOTE — Telephone Encounter (Signed)
Called patient to informed them that a referral had been placed for a nutritionist. Left message.

## 2019-08-22 NOTE — Telephone Encounter (Signed)
  Prescription Request  08/22/2019  What is the name of the medication or equipment? Lorazepam 1 mg. Was suppose to see  MMM on 6-17 but MMM was off and MMM was supposed to call her medications in that she needed and had to reschedule til 6-24. Patient needs today. She is out.  Have you contacted your pharmacy to request a refill? (if applicable) YES  Which pharmacy would you like this sent to? [facility] in [place]   Patient notified that their request is being sent to the clinical staff for review and that they should receive a response within 2 business days.

## 2019-08-25 ENCOUNTER — Telehealth: Payer: Self-pay

## 2019-08-25 MED ORDER — LORAZEPAM 1 MG PO TABS: 1.0000 mg | ORAL_TABLET | Freq: Three times a day (TID) | ORAL | 5 refills | Status: DC | PRN

## 2019-08-25 NOTE — Telephone Encounter (Signed)
Call to patient reference referral to [facility] received.  Explained PREP to patient. Only barrier is the distance to the [place] an hour away. Closest Y is [place]. Currently walks and does Pilates. Interested in online exercise with me M/W at 10am. Will send link to start Wednesday. Encouraged pt to investigate her local Y. She may have a benefit under her insurance as well as take advantage of [facility] program at the Y if [place] offers it.

## 2019-08-28 ENCOUNTER — Telehealth: Payer: Self-pay | Admitting: *Deleted

## 2019-08-28 ENCOUNTER — Ambulatory Visit (INDEPENDENT_AMBULATORY_CARE_PROVIDER_SITE_OTHER): Payer: 59 | Admitting: Nurse Practitioner

## 2019-08-28 ENCOUNTER — Encounter: Payer: Self-pay | Admitting: Nurse Practitioner

## 2019-08-28 ENCOUNTER — Other Ambulatory Visit: Payer: Self-pay

## 2019-08-28 VITALS — BP 118/74 | HR 70 | Temp 97.9°F | Resp 20 | Ht 66.0 in | Wt 190.0 lb

## 2019-08-28 DIAGNOSIS — F411 Generalized anxiety disorder: Secondary | ICD-10-CM

## 2019-08-28 DIAGNOSIS — Z6833 Body mass index (BMI) 33.0-33.9, adult: Secondary | ICD-10-CM

## 2019-08-28 DIAGNOSIS — K2100 Gastro-esophageal reflux disease with esophagitis, without bleeding: Secondary | ICD-10-CM

## 2019-08-28 DIAGNOSIS — J4541 Moderate persistent asthma with (acute) exacerbation: Secondary | ICD-10-CM | POA: Diagnosis not present

## 2019-08-28 DIAGNOSIS — E782 Mixed hyperlipidemia: Secondary | ICD-10-CM

## 2019-08-28 DIAGNOSIS — F3342 Major depressive disorder, recurrent, in full remission: Secondary | ICD-10-CM

## 2019-08-28 DIAGNOSIS — F5101 Primary insomnia: Secondary | ICD-10-CM

## 2019-08-28 LAB — CBC WITH DIFFERENTIAL/PLATELET
Basophils Absolute: 0.1 10*3/uL (ref 0.0–0.2)
Basos: 1 %
EOS (ABSOLUTE): 0.3 10*3/uL (ref 0.0–0.4)
Eos: 5 %
Hematocrit: 38.5 % (ref 34.0–46.6)
Hemoglobin: 13.2 g/dL (ref 11.1–15.9)
Immature Grans (Abs): 0 10*3/uL (ref 0.0–0.1)
Immature Granulocytes: 0 %
Lymphocytes Absolute: 1.8 10*3/uL (ref 0.7–3.1)
Lymphs: 31 %
MCH: 29.3 pg (ref 26.6–33.0)
MCHC: 34.3 g/dL (ref 31.5–35.7)
MCV: 85 fL (ref 79–97)
Monocytes Absolute: 0.4 10*3/uL (ref 0.1–0.9)
Monocytes: 6 %
Neutrophils Absolute: 3.3 10*3/uL (ref 1.4–7.0)
Neutrophils: 57 %
Platelets: 233 10*3/uL (ref 150–450)
RBC: 4.51 x10E6/uL (ref 3.77–5.28)
RDW: 13.3 % (ref 11.7–15.4)
WBC: 5.8 10*3/uL (ref 3.4–10.8)

## 2019-08-28 LAB — CMP14+EGFR
ALT: 14 IU/L (ref 0–32)
AST: 14 IU/L (ref 0–40)
Albumin/Globulin Ratio: 1.9 (ref 1.2–2.2)
Albumin: 4.3 g/dL (ref 3.8–4.8)
Alkaline Phosphatase: 78 IU/L (ref 48–121)
BUN/Creatinine Ratio: 20 (ref 12–28)
BUN: 18 mg/dL (ref 8–27)
Bilirubin Total: 0.2 mg/dL (ref 0.0–1.2)
CO2: 27 mmol/L (ref 20–29)
Calcium: 9.6 mg/dL (ref 8.7–10.3)
Chloride: 104 mmol/L (ref 96–106)
Creatinine, Ser: 0.91 mg/dL (ref 0.57–1.00)
GFR calc Af Amer: 79 mL/min/{1.73_m2} (ref >59)
GFR calc non Af Amer: 68 mL/min/{1.73_m2} (ref >59)
Globulin, Total: 2.3 g/dL (ref 1.5–4.5)
Glucose: 112 mg/dL — ABNORMAL HIGH (ref 65–99)
Potassium: 5 mmol/L (ref 3.5–5.2)
Sodium: 143 mmol/L (ref 134–144)
Total Protein: 6.6 g/dL (ref 6.0–8.5)

## 2019-08-28 MED ORDER — ROSUVASTATIN CALCIUM 40 MG PO TABS: 40.0000 mg | ORAL_TABLET | Freq: Every day | ORAL | 1 refills | Status: DC

## 2019-08-28 MED ORDER — DEXLANSOPRAZOLE 60 MG PO CPDR: 60.0000 mg | DELAYED_RELEASE_CAPSULE | Freq: Every day | ORAL | 1 refills | Status: DC

## 2019-08-28 MED ORDER — CITALOPRAM HYDROBROMIDE 40 MG PO TABS: ORAL_TABLET | ORAL | 1 refills | Status: DC

## 2019-08-28 MED ORDER — FAMOTIDINE 20 MG PO TABS: 20.0000 mg | ORAL_TABLET | Freq: Two times a day (BID) | ORAL | 1 refills | Status: DC

## 2019-08-28 NOTE — Addendum Note (Signed)
Addended by: Chevis Pretty on: 08/28/2019 09:44 AM   Modules accepted: Orders

## 2019-08-28 NOTE — Patient Instructions (Signed)

## 2019-08-28 NOTE — Progress Notes (Signed)
Subjective:    Patient ID: Crystal Wong, female    DOB: [birth year], 64 y.o.   MRN: [id number]   Chief Complaint: Medical Management of Chronic Issues   HPI  Chief Complaint: Medical Management of Chronic Issues    HPI:  1. Mixed hyperlipidemia Tries to watch diet. She has been trying to walk frequently. Cardiology wants her to go to lipid clinic Lab Results  Component Value Date   CHOL 292 (H) 08/19/2019   HDL 66 08/19/2019   LDLCALC 205 (H) 08/19/2019   TRIG 120 08/19/2019   CHOLHDL 4.4 08/19/2019     2. Moderate persistent asthma with acute exacerbation Doing well. No sob or wheezing.  3. Gastroesophageal reflux disease with esophagitis without hemorrhage Is on dexilant daily and is doing well. Keeps symptoms under control. She adds pepcid when she needs it.  4. Recurrent major depressive disorder, in full remission ([place]) Is on celexa and is doing well. Depression screen [facility] 2/9 08/28/2019 04/16/2019 03/28/2019  Decreased Interest 0 0 0  Down, Depressed, Hopeless 0 0 0  PHQ - 2 Score 0 0 0     5. GAD (generalized anxiety disorder) Takes ativan usually 1x a day.  GAD 7 : Generalized Anxiety Score 08/28/2019 02/20/2019  Nervous, Anxious, on Edge 0 0  Control/stop worrying 0 1  Worry too much - different things 0 0  Trouble relaxing 0 0  Restless 0 0  Easily annoyed or irritable 1 0  Afraid - awful might happen 0 0  Total GAD 7 Score 1 1  Anxiety Difficulty Not difficult at all Not difficult at all       6. Primary insomnia Is currently on no meds for sleep. Says she has been sleeping well.  7. BMI 33.0-33.9,adult No recent weight changes Wt Readings from Last 3 Encounters:  08/28/19 190 lb (86.2 kg)  08/19/19 190 lb (86.2 kg)  08/07/19 189 lb 12.8 oz (86.1 kg)   BMI Readings from Last 3 Encounters:  08/28/19 30.67 kg/m  08/19/19 30.67 kg/m  08/07/19 31.58 kg/m       Outpatient Encounter Medications as of 08/28/2019  Medication Sig    Ascorbic Acid (VITAMIN C ADULT GUMMIES PO) Take by mouth.   citalopram (CELEXA) 40 MG tablet TAKE 1 TABLET BY MOUTH EVERY DAY   dexlansoprazole (DEXILANT) 60 MG capsule Take 1 capsule (60 mg total) by mouth daily.   famotidine (PEPCID) 20 MG tablet TAKE 1 TABLET BY MOUTH TWICE A DAY   fluticasone (FLONASE) 50 MCG/ACT nasal spray SPRAY 2 SPRAYS INTO EACH NOSTRIL EVERY DAY   hydrocortisone (ANUSOL-HC) 25 MG suppository PLACE 1 SUPPOSITORY (25 MG TOTAL) RECTALLY 2 (TWO) TIMES DAILY.   LORATADINE-D 24HR 10-240 MG 24 hr tablet Take 1 tablet by mouth once daily   LORazepam (ATIVAN) 1 MG tablet Take 1 tablet (1 mg total) by mouth every 8 (eight) hours as needed.   polyethylene glycol powder (GLYCOLAX/MIRALAX) 17 GM/SCOOP powder Take 17 g by mouth at bedtime.   Pyridoxine HCl (VITAMIN B6 PO) Take by mouth.   rosuvastatin (CRESTOR) 40 MG tablet Take 1 tablet (40 mg total) by mouth daily.   sucralfate (CARAFATE) 1 g tablet TAKE 1 TABLET (1 G TOTAL) BY MOUTH 4 (FOUR) TIMES DAILY - WITH MEALS AND AT BEDTIME.   VENTOLIN HFA 108 (90 Base) MCG/ACT inhaler INHALE 2 PUFFS BY MOUTH EVERY 6 HOURS AS NEEDED FOR WHEEZING   VITAMIN D, CHOLECALCIFEROL, PO Take by mouth.   beclomethasone (QVAR  REDIHALER) 80 MCG/ACT inhaler Inhale 2 puffs into the lungs 2 (two) times daily. (Patient not taking: Reported on 08/28/2019)    Past Surgical History:  Procedure Laterality Date   ANKLE FRACTURE SURGERY Left    Pins and screws   BREAST LUMPECTOMY Bilateral    left x 1, right x 2   CARPAL TUNNEL RELEASE Right    CARPAL TUNNEL RELEASE Left    COLONOSCOPY     KNEE ARTHROSCOPY Bilateral    PARTIAL HYSTERECTOMY      Family History  Problem Relation Age of Onset   Colon cancer Father 25   Heart disease Mother    Diabetes Mother    Colon polyps Mother    Gallstones Paternal Grandmother    Gallbladder disease Daughter    Esophageal cancer Neg Hx    Stomach cancer Neg Hx    Rectal cancer  Neg Hx     New complaints: None today  Social history: Lives with husband. She has retired from [employer] substance contract: 02/20/19    Review of Systems  Constitutional: Negative for diaphoresis.  Eyes: Negative for pain.  Respiratory: Negative for shortness of breath.   Cardiovascular: Negative for chest pain, palpitations and leg swelling.  Gastrointestinal: Negative for abdominal pain.  Endocrine: Negative for polydipsia.  Skin: Negative for rash.  Neurological: Negative for dizziness, weakness and headaches.  Hematological: Does not bruise/bleed easily.  All other systems reviewed and are negative.      Objective:   Physical Exam Vitals and nursing note reviewed.  Constitutional:      General: She is not in acute distress.    Appearance: Normal appearance. She is well-developed.  HENT:     Head: Normocephalic.     Nose: Nose normal.  Eyes:     Pupils: Pupils are equal, round, and reactive to light.  Neck:     Vascular: No carotid bruit or JVD.  Cardiovascular:     Rate and Rhythm: Normal rate and regular rhythm.     Heart sounds: Normal heart sounds.  Pulmonary:     Effort: Pulmonary effort is normal. No respiratory distress.     Breath sounds: Normal breath sounds. No wheezing or rales.  Chest:     Chest wall: No tenderness.  Abdominal:     General: Bowel sounds are normal. There is no distension or abdominal bruit.     Palpations: Abdomen is soft. There is no hepatomegaly, splenomegaly, mass or pulsatile mass.     Tenderness: There is no abdominal tenderness.  Musculoskeletal:        General: Normal range of motion.     Cervical back: Normal range of motion and neck supple.  Lymphadenopathy:     Cervical: No cervical adenopathy.  Skin:    General: Skin is warm and dry.  Neurological:     Mental Status: She is alert and oriented to person, place, and time.     Deep Tendon Reflexes: Reflexes are normal and symmetric.  Psychiatric:         Behavior: Behavior normal.        Thought Content: Thought content normal.        Judgment: Judgment normal.     BP 118/74    Pulse 70    Temp 97.9 F (36.6 C) (Temporal)    Resp 20    Ht '5\' 6"'$  (1.676 m)    Wt 190 lb (86.2 kg)    SpO2 96%    BMI 30.67  kg/m        Assessment & Plan:   Crystal Wong comes in today with chief complaint of Medical Management of Chronic Issues   Diagnosis and orders addressed:  1. Mixed hyperlipidemia Low fat diet See lipid clinic - rosuvastatin (CRESTOR) 40 MG tablet; Take 1 tablet (40 mg total) by mouth daily.  Dispense: 90 tablet; Refill: 1 - CBC with Differential/Platelet - CMP14+EGFR - Lipid panel  2. Moderate persistent asthma with acute exacerbation Continue flonase as needed  3. Gastroesophageal reflux disease with esophagitis without hemorrhage Avoid spicy foods Do not eat 2 hours prior to bedtime - dexlansoprazole (DEXILANT) 60 MG capsule; Take 1 capsule (60 mg total) by mouth daily.  Dispense: 90 capsule; Refill: 1 - famotidine (PEPCID) 20 MG tablet; Take 1 tablet (20 mg total) by mouth 2 (two) times daily.  Dispense: 180 tablet; Refill: 1  4. Recurrent major depressive disorder, in full remission ([place]) Stress management - citalopram (CELEXA) 40 MG tablet; TAKE 1 TABLET BY MOUTH EVERY DAY  Dispense: 90 tablet; Refill: 1  5. GAD (generalized anxiety disorder)  6. Primary insomnia Bedtime routine  7. BMI 33.0-33.9,adult Discussed diet and exercise for person with BMI >25 Will recheck weight in 3-6 months    Labs pending Health Maintenance reviewed Diet and exercise encouraged  Follow up plan: 6 months   [name] Done, FNP

## 2019-08-28 NOTE — Telephone Encounter (Signed)
A detailed message was left, re: PharmD lipid clinic appointment.

## 2019-09-03 ENCOUNTER — Other Ambulatory Visit: Payer: Self-pay

## 2019-09-04 ENCOUNTER — Other Ambulatory Visit: Payer: Self-pay | Admitting: *Deleted

## 2019-09-04 DIAGNOSIS — J4521 Mild intermittent asthma with (acute) exacerbation: Secondary | ICD-10-CM

## 2019-09-04 MED ORDER — VENTOLIN HFA 108 (90 BASE) MCG/ACT IN AERS: INHALATION_SPRAY | RESPIRATORY_TRACT | 0 refills | Status: DC

## 2019-09-04 MED ORDER — CYCLOBENZAPRINE HCL 5 MG PO TABS
5.0000 mg | ORAL_TABLET | Freq: Every day | ORAL | 0 refills | Status: DC
Start: 2019-09-04 — End: 2019-12-01

## 2019-09-11 ENCOUNTER — Other Ambulatory Visit: Payer: Self-pay

## 2019-09-11 ENCOUNTER — Encounter: Payer: 59 | Attending: Cardiology | Admitting: Dietician

## 2019-09-11 ENCOUNTER — Encounter: Payer: Self-pay | Admitting: Dietician

## 2019-09-11 DIAGNOSIS — E782 Mixed hyperlipidemia: Secondary | ICD-10-CM | POA: Diagnosis not present

## 2019-09-11 NOTE — Progress Notes (Signed)
Medical Nutrition Therapy   Primary concerns today: high cholesterol, heart health, GERD   Referral diagnosis: E78.5- hyperlipidemia; K21.9- GERD Preferred learning style: no preference indicated Learning readiness: change in progress   NUTRITION ASSESSMENT   Anthropometrics  Weight: 188.5 lbs    Clinical Medical Hx: asthma, GERD, hyperlipidemia, obesity Labs: high LDL & total cholesterol   Lifestyle & Dietary Hx Patient is a retired Licensed conveyancer. States she now has more time to prioritize her health, including getting in physical activity throughout the week. States she has a family history of diabetes and heart disease and would like to prevent these. States she has lost ~24 lbs since March. Patient states she has no food allergies, but avoids citrus fruits, fried foods, and tomato sauces due to GERD. Avoiding potatoes because they are starchy. Typical meal pattern is 5 small meals/snacks per day, and tries to choose minimally processed foods. States she tries to eat well and include lots of vegetables, but has not always. States it can be difficult since her husband likes to cook and often fixes foods she would rather avoid such as crispy chicken thighs and creamy sauces.   Estimated daily fluid intake: 48 oz water + 8 oz milk + 20 oz diet soda  Supplements: vitamin D, B6, C Current average weekly physical activity: pilates   24-Hr Dietary Recall First Meal: protein shake + Cheerios + 2% milk + banana Snack: kiwi Second Meal: carrots + cheese stick  Snack: - Third Meal: hot ham & cheese sandwich on keiser  Snack: pretzels Beverages: water, flavored water, milk, diet Pepsi  Estimated Energy Needs Calories: 1600 Carbohydrate: 180g Protein: 100g Fat: 53g   NUTRITION DIAGNOSIS  Altered nutrition related laboratory values ([place]-2.2) related to hyperlipidemia as evidenced by total cholesterol level of 292 mg/dL and LDL level of 205 mg/dL.    NUTRITION INTERVENTION  Nutrition  education (E-1) on the following topics:  . Heart healthy nutrition  . GERD nutrition   Handouts Provided Include   Heart Healthy Nutrition Therapy   MyPlate Portions  Types of Fat  Learning Style & Readiness for Change Teaching method utilized: Visual & Auditory  Demonstrated degree of understanding via: Teach Back  Barriers to learning/adherence to lifestyle change: None Identified     MONITORING & EVALUATION Dietary intake, weekly physical activity, and goals PRN.  Next Steps  Patient is to contact [facility] for follow up or questions as needed.

## 2019-09-29 ENCOUNTER — Other Ambulatory Visit: Payer: Self-pay | Admitting: *Deleted

## 2019-09-29 MED ORDER — LORATADINE-D 24HR 10-240 MG PO TB24: 1.0000 | ORAL_TABLET | Freq: Every day | ORAL | 1 refills | Status: DC

## 2019-10-02 ENCOUNTER — Ambulatory Visit (INDEPENDENT_AMBULATORY_CARE_PROVIDER_SITE_OTHER): Payer: 59 | Admitting: Pharmacist Clinician (PhC)/ Clinical Pharmacy Specialist

## 2019-10-02 ENCOUNTER — Other Ambulatory Visit: Payer: Self-pay

## 2019-10-02 DIAGNOSIS — E782 Mixed hyperlipidemia: Secondary | ICD-10-CM

## 2019-10-02 NOTE — Progress Notes (Signed)
10/06/2019 Crystal Wong 18-Apr-1955 [id number]   HPI:  Crystal Wong is a 64 y.o. female patient of Dr Gardiner Rhyme, who presents today for a lipid clinic evaluation.  Her medical history is significant only for depression/anxiety, GERD and elevated lipids.  Her most recent labs show an LD of 205, despite being on rosuvastatin 40 mg daily.  Patient denies compliance problems.  She has a family history of hypercholesterolemia, and had a total cholesterol of > 300 when it was first checked, about 30 years ago.     Current Medications: rosuvastatin 40 mg qd  Cholesterol Goals: LDL < 100   Intolerant/previously tried: no other statins tried - has tolerated rosuvastatin well  Family history: father 75'4" 160 lb, cholesterol > 400, died at 76 post colon cancer; mother died from MI at 74,  2 sisters, one with elevated cholesterol; 2 daughters w/o issues  Diet: mostly home cooked, low sodium; patient wants to go vegetatrian; breakfast is shake or yogurt, special K, banana;   Exercise:  Normally  - doing core back exercise due to back injury; usually walks 2 miles per day, pilates - cut back due to back injury  Labs: 6/21:  TC 292, TG 120, HDL 66, LDL 205 (rosuvastatin 40 mg)   Current Outpatient Medications  Medication Sig Dispense Refill  . Ascorbic Acid (VITAMIN C ADULT GUMMIES PO) Take by mouth.    . beclomethasone (QVAR REDIHALER) 80 MCG/ACT inhaler Inhale 2 puffs into the lungs 2 (two) times daily. 10.6 g 3  . citalopram (CELEXA) 40 MG tablet TAKE 1 TABLET BY MOUTH EVERY DAY 90 tablet 1  . cyclobenzaprine (FLEXERIL) 5 MG tablet Take 1 tablet (5 mg total) by mouth at bedtime. 30 tablet 0  . dexlansoprazole (DEXILANT) 60 MG capsule Take 1 capsule (60 mg total) by mouth daily. 90 capsule 1  . famotidine (PEPCID) 20 MG tablet Take 1 tablet (20 mg total) by mouth 2 (two) times daily. 180 tablet 1  . fluticasone (FLONASE) 50 MCG/ACT nasal spray SPRAY 2 SPRAYS INTO EACH NOSTRIL EVERY DAY 48 mL 1  .  hydrocortisone (ANUSOL-HC) 25 MG suppository PLACE 1 SUPPOSITORY (25 MG TOTAL) RECTALLY 2 (TWO) TIMES DAILY. 12 suppository 0  . loratadine-pseudoephedrine (LORATADINE-D 24HR) 10-240 MG 24 hr tablet Take 1 tablet by mouth daily. 90 tablet 1  . LORazepam (ATIVAN) 1 MG tablet Take 1 tablet (1 mg total) by mouth every 8 (eight) hours as needed. 30 tablet 5  . polyethylene glycol powder (GLYCOLAX/MIRALAX) 17 GM/SCOOP powder Take 17 g by mouth at bedtime.    . Pyridoxine HCl (VITAMIN B6 PO) Take by mouth.    . rosuvastatin (CRESTOR) 40 MG tablet Take 1 tablet (40 mg total) by mouth daily. 90 tablet 1  . sucralfate (CARAFATE) 1 g tablet TAKE 1 TABLET (1 G TOTAL) BY MOUTH 4 (FOUR) TIMES DAILY - WITH MEALS AND AT BEDTIME. 120 tablet 2  . VENTOLIN HFA 108 (90 Base) MCG/ACT inhaler INHALE 2 PUFFS BY MOUTH EVERY 6 HOURS AS NEEDED FOR WHEEZING 18 g 0  . VITAMIN D, CHOLECALCIFEROL, PO Take by mouth.     No current facility-administered medications for this visit.    Allergies  Allergen Reactions  . Sulfa Antibiotics Anaphylaxis  . Sulfamethoxazole-Trimethoprim Swelling  . Sulfur Other (See Comments)    Other reaction(s): Angioedema (ALLERGY/intolerance) Other reaction(s): Angioedema (ALLERGY/intolerance)    Past Medical History:  Diagnosis Date  . Anxiety   . Asthma   . Depression   .  GERD (gastroesophageal reflux disease)   . Heart murmur   . Hyperlipidemia   . Insomnia     Blood pressure 118/78, pulse 77, resp. rate 15, height 5\' 5"  (1.651 m), weight 190 lb (86.2 kg), SpO2 97 %.   Mixed hyperlipidemia Patient with familial hyperlipidemia, LDL at 205 on rosuvastatin 40 mg daily.  Reviewed options for lowering LDL cholesterol, including ezetimibe, PCSK-9 inhibitors and bempedoic acid.  Discussed mechanisms of action, dosing, side effects and potential decreases in LDL cholesterol.  Answered all patient questions.  Based on this information, patient would prefer to start PCSK-9 inhibitor.   Will start the PA process and let patient know once approved.  Repeat labs after 3 months.      Tommy Medal PharmD CPP [facility] [address] [place], [place] [address] [phone]

## 2019-10-02 NOTE — Patient Instructions (Signed)
Your Results:             Your most recent labs Goal  Total Cholesterol 292 < 200  Triglycerides 120 < 150  HDL (happy/good cholesterol) 66 > 40  LDL (lousy/bad cholesterol 205 < 70      Medication changes:  We will start the paperwork to get Repatha (or Praluent) covered by your insurance.  [facility] will call you once it is approved and you can then pick it up at your local pharmacy  Lab orders:  We will send you a lab order in 2 months - you can have labs drawn after the 5th or 6th dose.  Please be fasting for this lab draw

## 2019-10-06 ENCOUNTER — Encounter: Payer: Self-pay | Admitting: Pharmacist Clinician (PhC)/ Clinical Pharmacy Specialist

## 2019-10-06 NOTE — Assessment & Plan Note (Signed)
Patient with familial hyperlipidemia, LDL at 205 on rosuvastatin 40 mg daily.  Reviewed options for lowering LDL cholesterol, including ezetimibe, PCSK-9 inhibitors and bempedoic acid.  Discussed mechanisms of action, dosing, side effects and potential decreases in LDL cholesterol.  Answered all patient questions.  Based on this information, patient would prefer to start PCSK-9 inhibitor.  Will start the PA process and let patient know once approved.  Repeat labs after 3 months.

## 2019-10-13 ENCOUNTER — Telehealth: Payer: Self-pay

## 2019-10-13 MED ORDER — REPATHA SURECLICK 140 MG/ML ~~LOC~~ SOAJ: 140.0000 mg | SUBCUTANEOUS | 11 refills | Status: DC

## 2019-10-13 NOTE — Telephone Encounter (Signed)
Called pt and lmomed her to start repatha. Instructed the pt to apply for a copay card and to callback if they have any issues

## 2019-11-30 ENCOUNTER — Other Ambulatory Visit: Payer: Self-pay | Admitting: Nurse Practitioner

## 2019-11-30 DIAGNOSIS — B3731 Acute candidiasis of vulva and vagina: Secondary | ICD-10-CM

## 2019-12-01 ENCOUNTER — Other Ambulatory Visit: Payer: Self-pay | Admitting: *Deleted

## 2019-12-01 MED ORDER — SUCRALFATE 1 G PO TABS: 1.0000 g | ORAL_TABLET | Freq: Three times a day (TID) | ORAL | 2 refills | Status: DC

## 2019-12-05 ENCOUNTER — Other Ambulatory Visit: Payer: Self-pay | Admitting: Pharmacist Clinician (PhC)/ Clinical Pharmacy Specialist

## 2019-12-05 DIAGNOSIS — E782 Mixed hyperlipidemia: Secondary | ICD-10-CM

## 2019-12-24 ENCOUNTER — Ambulatory Visit: Payer: 59 | Admitting: Gastroenterology

## 2019-12-24 ENCOUNTER — Encounter: Payer: Self-pay | Admitting: Gastroenterology

## 2019-12-24 VITALS — BP 116/82 | HR 78 | Ht 65.0 in | Wt 188.0 lb

## 2019-12-24 DIAGNOSIS — R0789 Other chest pain: Secondary | ICD-10-CM

## 2019-12-24 DIAGNOSIS — K2100 Gastro-esophageal reflux disease with esophagitis, without bleeding: Secondary | ICD-10-CM

## 2019-12-24 LAB — LIPID PANEL
Chol/HDL Ratio: 2.7 ratio (ref 0.0–4.4)
Cholesterol, Total: 143 mg/dL (ref 100–199)
HDL: 53 mg/dL (ref >39)
LDL Chol Calc (NIH): 75 mg/dL (ref 0–99)
Triglycerides: 76 mg/dL (ref 0–149)
VLDL Cholesterol Cal: 15 mg/dL (ref 5–40)

## 2019-12-24 MED ORDER — FAMOTIDINE 20 MG PO TABS: 20.0000 mg | ORAL_TABLET | Freq: Two times a day (BID) | ORAL | 1 refills | Status: DC

## 2019-12-24 MED ORDER — DEXLANSOPRAZOLE 60 MG PO CPDR: 60.0000 mg | DELAYED_RELEASE_CAPSULE | Freq: Every day | ORAL | 1 refills | Status: DC

## 2019-12-24 NOTE — Patient Instructions (Addendum)
If you are age 62 or older, your body mass index should be between 23-30. Your Body mass index is 31.28 kg/m. If this is out of the aforementioned range listed, please consider follow up with your Primary Care Provider.  If you are age 82 or younger, your body mass index should be between 19-25. Your Body mass index is 31.28 kg/m. If this is out of the aformentioned range listed, please consider follow up with your Primary Care Provider.   You will be due for an office visit in 6 months. We will send you a reminder in the mail when it gets closer to that time.  Congratulation on your diet changes and your weight lifting! This will help your GI tract stay healthy in addition to all of the other benefits.   I have not changed your medications today.   I would like to see you in 6 months, earlier if needed.   Thank you for trusting me with your gastrointestinal care!    Thornton Park, MD, MPH

## 2019-12-24 NOTE — Progress Notes (Signed)
Referring Provider: Chevis Pretty, * Primary Care Physician:  Chevis Pretty, FNP  Chief complaint: Reflux, esophageal spasms, abdominal pain, and constipation   IMPRESSION:  Reflux esophagitis presenting with chest/esophageal spasm    - prior EGD with Dr. Roney Mans 2017, no biopsies obtained at that time    - EGD 06/09/19: reflux esophagitis, no eosinophilic esophagitis or Barrett's Chronic constipation - controlled on Miralax    - TSH and calcium normal 2020 Mild H pylori negative gastritis on EGD 06/09/19 Asymptomatic cholelithiasis Colonoscopy with Dr. Roney Mans 2017 Family history of colon cancer (father in his 89s)  GERD manifesting as chest spasms: Coninue Dexilant 60 mg daily and famotidine 20 mg BID. Recommend 24 pH probe, impedence testing, and esophageal manometry if not improving. Discussed long term risks of PPI use. She is finally feeling better and not ready to taper therapy at this time.   H pylori negative gastritis: Continue PPI and H2Blocker. Avoid all NSAIDs.   Chronic constipation: Discussed dietary recommendations, including Move-It Potion. I recommended that she continue to use Miralax.   Hair loss: I do not expect this to be related to [place] or famotidine. I have asked her to review her concerns with [name] Done.    PLAN: - Continue Dexilant to 60 mg daily, continue famotidine 20 mg twice daily (90 day refill with 3 months) - Reviewed lifestyle modifications including working to maintain a health weight - Continue daily Miralax - Follow-up in 6 months, earlier if needed  Please see the "Patient Instructions" section for addition details about the plan.  HPI: Crystal Wong is a 64 y.o. returns in scheduled follow-up for reflux. The interval history is obtained through the patient and review of her electronic health record. Retired Academic librarian. She has anxiety, asthma, depression, reflux, hypercholesterolemia, obesity, and prior  hysterectomy.   Several years of GERD.  Symptoms consist of intermittent postprandial pyrosis with associated dysphonia and throat discomfort.  Records show occasional dysphagia to solids and intermittent epigastric pain.    She was having acute, intermittent "attacks" described as chest and/or stomach spasms. Frequent brash and nocturnal symptoms.   No dysphagia or odynophagia. No dysphonia. No sore throat or neck pain.   Increased famotidine QHS dose, continued her Dexilant 30 mg QAM, and added carafate 1 g QID, but she found the dosing schedule cumbersome.  Was having significant gas at that time. Changed her diet to use protein shakes with fruit in the morning, cut back on caffeine, eating less processed foods with smaller portions. Working out on an Teacher, adult education.   Has had constipation since high school despite drinking a lot of water. NP Hassell Done has recommended Miralax daily.  She had an EGD 06/09/19 that showed: - Normal esophagus. Biopsies showed reflux and were negative for Barrett's and EOE. - Mild chronic gastritis. No H pylori.  - Normal examined duodenum.  Seen in follow-up 07/22/19 taking Dexilant 60 mg once daily and famotidine 20 mg twice daily, until her insurannce wouldn't approve a refill on the 60 mg daily Dexilant. They would only allow 30 mg daily. She was avoiding all NSAIDs. Purchased the OTC famotidine instead. Having some intermittent symptoms.   She returns today in scheduled follow-up. Symptoms have improved on Dexilant and famotidine. No additional palpitations or spasms. She has removed fried foods and sugar from her diet and reduced her snacking. She is feeling better although she is frustrated that she's not losing fat. She is lifting weights and doing HIIT. She is motivated to  be healthy to be around for her grand children.   Her daughter is getting married on Saturday. She notes being under significant stress. She wondered if the PPI or H2Blocker could be contributing  to hair loss.   Past Medical History:  Diagnosis Date  . Anxiety   . Asthma   . Depression   . GERD (gastroesophageal reflux disease)   . Heart murmur   . Hyperlipidemia   . Insomnia     Past Surgical History:  Procedure Laterality Date  . ANKLE FRACTURE SURGERY Left    Pins and screws  . BREAST LUMPECTOMY Bilateral    left x 1, right x 2  . CARPAL TUNNEL RELEASE Right   . CARPAL TUNNEL RELEASE Left   . COLONOSCOPY    . KNEE ARTHROSCOPY Bilateral   . PARTIAL HYSTERECTOMY      Current Outpatient Medications  Medication Sig Dispense Refill  . Ascorbic Acid (VITAMIN C ADULT GUMMIES PO) Take 1 each by mouth daily.     . citalopram (CELEXA) 40 MG tablet TAKE 1 TABLET BY MOUTH EVERY DAY 90 tablet 1  . cyclobenzaprine (FLEXERIL) 5 MG tablet TAKE 1 TABLET BY MOUTH EVERYDAY AT BEDTIME (Patient taking differently: Take 5 mg by mouth as needed. ) 30 tablet 0  . dexlansoprazole (DEXILANT) 60 MG capsule Take 1 capsule (60 mg total) by mouth daily. 90 capsule 1  . Evolocumab (REPATHA SURECLICK) 008 MG/ML SOAJ Inject 140 mg into the skin every 14 (fourteen) days. 2 mL 11  . famotidine (PEPCID) 20 MG tablet Take 1 tablet (20 mg total) by mouth 2 (two) times daily. 180 tablet 1  . fluticasone (FLONASE) 50 MCG/ACT nasal spray SPRAY 2 SPRAYS INTO EACH NOSTRIL EVERY DAY (Patient taking differently: Place 1 spray into both nostrils as needed. ) 48 mL 1  . hydrocortisone (ANUSOL-HC) 25 MG suppository PLACE 1 SUPPOSITORY (25 MG TOTAL) RECTALLY 2 (TWO) TIMES DAILY. (Patient taking differently: Place 25 mg rectally as needed. ) 12 suppository 0  . loratadine-pseudoephedrine (LORATADINE-D 24HR) 10-240 MG 24 hr tablet Take 1 tablet by mouth daily. 90 tablet 1  . LORazepam (ATIVAN) 1 MG tablet Take 1 tablet (1 mg total) by mouth every 8 (eight) hours as needed. 30 tablet 5  . polyethylene glycol powder (GLYCOLAX/MIRALAX) 17 GM/SCOOP powder Take 17 g by mouth daily.     . Pyridoxine HCl (VITAMIN B6 PO)  Take 1 each by mouth daily.     . rosuvastatin (CRESTOR) 40 MG tablet Take 1 tablet (40 mg total) by mouth daily. 90 tablet 1  . VENTOLIN HFA 108 (90 Base) MCG/ACT inhaler INHALE 2 PUFFS BY MOUTH EVERY 6 HOURS AS NEEDED FOR WHEEZING 18 g 0  . VITAMIN D, CHOLECALCIFEROL, PO Take 1 tablet by mouth daily.      No current facility-administered medications for this visit.    Allergies as of 12/24/2019 - Review Complete 12/24/2019  Allergen Reaction Noted  . Sulfa antibiotics Anaphylaxis 12/19/2012  . Sulfamethoxazole-trimethoprim Swelling 03/24/2014  . Sulfur Other (See Comments) 05/12/2015    Family History  Problem Relation Age of Onset  . Colon cancer Father 78  . Heart disease Mother   . Diabetes Mother   . Colon polyps Mother   . Gallstones Paternal Grandmother   . Gallbladder disease Daughter   . Esophageal cancer Neg Hx   . Stomach cancer Neg Hx   . Rectal cancer Neg Hx     Social History   Socioeconomic History  .  Marital status: Married    Spouse name: Not on file  . Number of children: 2  . Years of education: Not on file  . Highest education level: Not on file  Occupational History  . Occupation: retired  Tobacco Use  . Smoking status: Never Smoker  . Smokeless tobacco: Never Used  Vaping Use  . Vaping Use: Never used  Substance and Sexual Activity  . Alcohol use: Not Currently  . Drug use: No  . Sexual activity: Not on file  Other Topics Concern  . Not on file  Social History Narrative  . Not on file   Social Determinants of Health   Financial Resource Strain:   . Difficulty of Paying Living Expenses: Not on file  Food Insecurity:   . Worried About Charity fundraiser in the Last Year: Not on file  . Ran Out of Food in the Last Year: Not on file  Transportation Needs:   . Lack of Transportation (Medical): Not on file  . Lack of Transportation (Non-Medical): Not on file  Physical Activity:   . Days of Exercise per Week: Not on file  . Minutes of  Exercise per Session: Not on file  Stress:   . Feeling of Stress : Not on file  Social Connections:   . Frequency of Communication with Friends and Family: Not on file  . Frequency of Social Gatherings with Friends and Family: Not on file  . Attends Religious Services: Not on file  . Active Member of Clubs or Organizations: Not on file  . Attends Archivist Meetings: Not on file  . Marital Status: Not on file  Intimate Partner Violence:   . Fear of Current or Ex-Partner: Not on file  . Emotionally Abused: Not on file  . Physically Abused: Not on file  . Sexually Abused: Not on file    Physical Exam: General:   Alert,  well-nourished, pleasant and cooperative in NAD Head:  Normocephalic and atraumatic. Eyes:  Sclera clear, no icterus.   Conjunctiva pink. Abdomen:  Soft, nontender, nondistended, normal bowel sounds, no rebound or guarding. No hepatosplenomegaly.   Neurologic:  Alert and  oriented x4;  grossly nonfocal Skin:  Intact without significant lesions or rashes. Psych:  Alert and cooperative. Normal mood and affect.      Ailine Hefferan L. Tarri Glenn, MD, MPH 12/24/2019, 10:40 AM

## 2019-12-28 ENCOUNTER — Other Ambulatory Visit: Payer: Self-pay | Admitting: Nurse Practitioner

## 2019-12-28 NOTE — Progress Notes (Signed)
Cardiology Office Note:    Date:  12/29/2019   ID:  WHITLEE SLUDER, DOB 05/15/1955, MRN [id number]  PCP:  Chevis Pretty, FNP  Cardiologist:  Donato Heinz, MD  Electrophysiologist:  None   Referring MD: Chevis Pretty, *   Chief Complaint  Patient presents with  . Palpitations    History of Present Illness:    Crystal Wong is a 64 y.o. female with a hx of anxiety, depression, asthma, GERD, hyperlipidemia who presents for follow-up.  She was referred by Dr. Tarri Glenn for pre-op evaluation, initially seen on 05/19/2019.  She was referred to gastroenterology for EGD, requested cardiac evaluation prior to procedure.  She reports that she has been having a fluttering feeling in her chest that started in January.  Was occurring every day, but has not had for the last week.  Can last for 20 minutes up to hours.  In addition to chest fluttering, she sometimes avoids episodes where she feels like her heart is racing, but states that this is related to caffeine use.  Has also been having burning in her chest that she has attributed to GERD.  Has improved with increasing her GERD medications.  Reports that she exercises regularly, walks 4 miles per day and does Pilates for 30 minutes.  Denies any exertional chest pain.  Does report some dyspnea with walking up hills.  Never smoked.  Mother had MI at 73.   Reports she was told years ago she had mitral valve prolapse after she was evaluated for heart murmur  At initial clinic visit on 05/19/2019, no further work-up was recommended prior to her EGD, which he underwent on 06/09/2019.  TTE on 06/03/2019 showed LVEF 60 to 65%, normal RV function, mild aortic stenosis, mild to moderate aortic regurgitation.  Zio patch x14 days on 07/17/2019 showed no significant arrhythmias.  Calcium score on 06/03/2019 was 0.  LDL 205 on 08/19/2019 despite being on rosuvastatin 40 mg daily.  Referred to lipid clinic, started on [place].  Repeat lipid panel on  12/24/2019 showed marked improvement in LDL to 75.  Since last clinic visit, he reports she has been doing well.  She recently twisted her knee climbing a ladder and has not been exercising as much since.  Prior to this was walking 2 miles per day.  She has also been doing Pilates and high intensity interval training workouts.  Denies any chest pain, dyspnea, lightheadedness, syncope, or lower extremity edema.  Reports palpitations have resolved.    Past Medical History:  Diagnosis Date  . Anxiety   . Asthma   . Depression   . GERD (gastroesophageal reflux disease)   . Heart murmur   . Hyperlipidemia   . Insomnia     Past Surgical History:  Procedure Laterality Date  . ANKLE FRACTURE SURGERY Left    Pins and screws  . BREAST LUMPECTOMY Bilateral    left x 1, right x 2  . CARPAL TUNNEL RELEASE Right   . CARPAL TUNNEL RELEASE Left   . COLONOSCOPY    . KNEE ARTHROSCOPY Bilateral   . PARTIAL HYSTERECTOMY      Current Medications: Current Meds  Medication Sig  . Ascorbic Acid (VITAMIN C ADULT GUMMIES PO) Take 1 each by mouth daily.   . citalopram (CELEXA) 40 MG tablet TAKE 1 TABLET BY MOUTH EVERY DAY  . dexlansoprazole (DEXILANT) 60 MG capsule Take 1 capsule (60 mg total) by mouth daily.  . Evolocumab (REPATHA SURECLICK) 376 MG/ML SOAJ Inject  140 mg into the skin every 14 (fourteen) days.  . famotidine (PEPCID) 20 MG tablet Take 1 tablet (20 mg total) by mouth 2 (two) times daily.  . fluticasone (FLONASE) 50 MCG/ACT nasal spray SPRAY 2 SPRAYS INTO EACH NOSTRIL EVERY DAY (Patient taking differently: Place 1 spray into both nostrils as needed. )  . hydrocortisone (ANUSOL-HC) 25 MG suppository PLACE 1 SUPPOSITORY (25 MG TOTAL) RECTALLY 2 (TWO) TIMES DAILY. (Patient taking differently: Place 25 mg rectally as needed. )  . loratadine-pseudoephedrine (LORATADINE-D 24HR) 10-240 MG 24 hr tablet Take 1 tablet by mouth daily.  Marland Kitchen LORazepam (ATIVAN) 1 MG tablet Take 1 tablet (1 mg total) by  mouth every 8 (eight) hours as needed.  . polyethylene glycol powder (GLYCOLAX/MIRALAX) 17 GM/SCOOP powder Take 17 g by mouth daily. Pt takes as needed  . Pyridoxine HCl (VITAMIN B6 PO) Take 1 each by mouth daily.   . rosuvastatin (CRESTOR) 40 MG tablet Take 1 tablet (40 mg total) by mouth daily.  . VENTOLIN HFA 108 (90 Base) MCG/ACT inhaler INHALE 2 PUFFS BY MOUTH EVERY 6 HOURS AS NEEDED FOR WHEEZING  . VITAMIN D, CHOLECALCIFEROL, PO Take 1 tablet by mouth daily.   . [DISCONTINUED] cyclobenzaprine (FLEXERIL) 5 MG tablet TAKE 1 TABLET BY MOUTH EVERYDAY AT BEDTIME (Patient taking differently: Take 5 mg by mouth as needed. )     Allergies:   Sulfa antibiotics, Sulfamethoxazole-trimethoprim, and Sulfur   Social History   Socioeconomic History  . Marital status: Married    Spouse name: Not on file  . Number of children: 2  . Years of education: Not on file  . Highest education level: Not on file  Occupational History  . Occupation: retired  Tobacco Use  . Smoking status: Never Smoker  . Smokeless tobacco: Never Used  Vaping Use  . Vaping Use: Never used  Substance and Sexual Activity  . Alcohol use: Not Currently  . Drug use: No  . Sexual activity: Not on file  Other Topics Concern  . Not on file  Social History Narrative  . Not on file   Social Determinants of Health   Financial Resource Strain:   . Difficulty of Paying Living Expenses: Not on file  Food Insecurity:   . Worried About Charity fundraiser in the Last Year: Not on file  . Ran Out of Food in the Last Year: Not on file  Transportation Needs:   . Lack of Transportation (Medical): Not on file  . Lack of Transportation (Non-Medical): Not on file  Physical Activity:   . Days of Exercise per Week: Not on file  . Minutes of Exercise per Session: Not on file  Stress:   . Feeling of Stress : Not on file  Social Connections:   . Frequency of Communication with Friends and Family: Not on file  . Frequency of Social  Gatherings with Friends and Family: Not on file  . Attends Religious Services: Not on file  . Active Member of Clubs or Organizations: Not on file  . Attends Archivist Meetings: Not on file  . Marital Status: Not on file     Family History: The patient's family history includes Colon cancer (age of onset: 61) in her father; Colon polyps in her mother; Diabetes in her mother; Gallbladder disease in her daughter; Gallstones in her paternal grandmother; Heart disease in her mother. There is no history of Esophageal cancer, Stomach cancer, or Rectal cancer.  ROS:   Please see the  history of present illness.     All other systems reviewed and are negative.  EKGs/Labs/Other Studies Reviewed:    The following studies were reviewed today:   EKG:  EKG is ordered today.  The ekg ordered  demonstrates normal sinus rhythm, rate 77, RBBB, no ST/T wave abnormalities  Recent Labs: 08/28/2019: ALT 14; BUN 18; Creatinine, Ser 0.91; Hemoglobin 13.2; Platelets 233; Potassium 5.0; Sodium 143  Recent Lipid Panel    Component Value Date/Time   CHOL 143 12/24/2019 1124   CHOL 225 (H) 08/23/2012 1155   TRIG 76 12/24/2019 1124   TRIG 125 12/23/2013 1004   TRIG 51 08/23/2012 1155   HDL 53 12/24/2019 1124   HDL 58 12/23/2013 1004   HDL 59 08/23/2012 1155   CHOLHDL 2.7 12/24/2019 1124   LDLCALC 75 12/24/2019 1124   LDLCALC 163 (H) 12/23/2013 1004   LDLCALC 156 (H) 08/23/2012 1155    Physical Exam:    VS:  BP 126/74   Pulse 77   Ht 5\' 5"  (1.651 m)   Wt 194 lb (88 kg)   SpO2 97%   BMI 32.28 kg/m     Wt Readings from Last 3 Encounters:  12/29/19 194 lb (88 kg)  12/24/19 188 lb (85.3 kg)  10/02/19 190 lb (86.2 kg)     GEN:  Well nourished, well developed in no acute distress HEENT: Normal NECK: No JVD; No carotid bruits LYMPHATICS: No lymphadenopathy CARDIAC: RRR, 2/6 systolic hear murmur RESPIRATORY:  Clear to auscultation without rales, wheezing or rhonchi  ABDOMEN: Soft,  non-tender, non-distended MUSCULOSKELETAL:  No edema; No deformity  SKIN: Warm and dry NEUROLOGIC:  Alert and oriented x 3 PSYCHIATRIC:  Normal affect   ASSESSMENT:    1. Hyperlipidemia, unspecified hyperlipidemia type   2. Mild aortic stenosis   3. Aortic valve insufficiency, etiology of cardiac valve disease unspecified   4. Palpitations    PLAN:    Palpitations: Zio patch x14 days on 07/17/2019 showed no significant arrhythmias.  Reports palpitations have resolved.  Aortic stenosis/regurgitation:  TTE on 06/03/2019 showed LVEF 60 to 65%, normal RV function, mild aortic stenosis, mild to moderate aortic regurgitation.  Will monitor with repeat echo in March 2022  Hyperlipidemia: LDL 205 on 08/19/2019 despite being on rosuvastatin 40 mg daily.  Referred to lipid clinic, started on [place].  Repeat lipid panel on 12/24/2019 showed marked improvement in LDL to 75.  RTC in 6 months    Medication Adjustments/Labs and Tests Ordered: Current medicines are reviewed at length with the patient today.  Concerns regarding medicines are outlined above.  Orders Placed This Encounter  Procedures  . EKG 12-Lead   No orders of the defined types were placed in this encounter.   Patient Instructions  Medication Instructions:  Your physician recommends that you continue on your current medications as directed. Please refer to the Current Medication list given to you today.  Testing/Procedures: Your physician has requested that you have an echocardiogram DUE March/April 2022. Echocardiography is a painless test that uses sound waves to create images of your heart. It provides your doctor with information about the size and shape of your heart and how well your heart's chambers and valves are working. This procedure takes approximately one hour. There are no restrictions for this procedure.   Follow-Up: At [facility], you and your health needs are our priority.  As part of our continuing  mission to provide you with exceptional heart care, we have created designated Provider Care  Teams.  These Care Teams include your primary Cardiologist (physician) and Advanced Practice Providers (APPs -  Physician Assistants and Nurse Practitioners) who all work together to provide you with the care you need, when you need it.  We recommend signing up for the patient portal called "MyChart".  Sign up information is provided on this After Visit Summary.  MyChart is used to connect with patients for Virtual Visits (Telemedicine).  Patients are able to view lab/test results, encounter notes, upcoming appointments, etc.  Non-urgent messages can be sent to your provider as well.   To learn more about what you can do with MyChart, go to [email or web address].    Your next appointment:   6 month(s)  The format for your next appointment:   In Person  Provider:   Oswaldo Milian, MD         Signed, Donato Heinz, MD  12/29/2019 5:47 PM    [place]

## 2019-12-29 ENCOUNTER — Other Ambulatory Visit: Payer: Self-pay

## 2019-12-29 ENCOUNTER — Ambulatory Visit: Payer: 59 | Admitting: Cardiology

## 2019-12-29 ENCOUNTER — Encounter: Payer: Self-pay | Admitting: Cardiology

## 2019-12-29 VITALS — BP 126/74 | HR 77 | Ht 65.0 in | Wt 194.0 lb

## 2019-12-29 DIAGNOSIS — I350 Nonrheumatic aortic (valve) stenosis: Secondary | ICD-10-CM

## 2019-12-29 DIAGNOSIS — R002 Palpitations: Secondary | ICD-10-CM | POA: Diagnosis not present

## 2019-12-29 DIAGNOSIS — I351 Nonrheumatic aortic (valve) insufficiency: Secondary | ICD-10-CM | POA: Diagnosis not present

## 2019-12-29 DIAGNOSIS — E785 Hyperlipidemia, unspecified: Secondary | ICD-10-CM | POA: Diagnosis not present

## 2019-12-29 NOTE — Patient Instructions (Signed)
Medication Instructions:  Your physician recommends that you continue on your current medications as directed. Please refer to the Current Medication list given to you today.  Testing/Procedures: Your physician has requested that you have an echocardiogram DUE March/April 2022. Echocardiography is a painless test that uses sound waves to create images of your heart. It provides your doctor with information about the size and shape of your heart and how well your heart's chambers and valves are working. This procedure takes approximately one hour. There are no restrictions for this procedure.   Follow-Up: At [facility], you and your health needs are our priority.  As part of our continuing mission to provide you with exceptional heart care, we have created designated Provider Care Teams.  These Care Teams include your primary Cardiologist (physician) and Advanced Practice Providers (APPs -  Physician Assistants and Nurse Practitioners) who all work together to provide you with the care you need, when you need it.  We recommend signing up for the patient portal called "MyChart".  Sign up information is provided on this After Visit Summary.  MyChart is used to connect with patients for Virtual Visits (Telemedicine).  Patients are able to view lab/test results, encounter notes, upcoming appointments, etc.  Non-urgent messages can be sent to your provider as well.   To learn more about what you can do with MyChart, go to [email or web address].    Your next appointment:   6 month(s)  The format for your next appointment:   In Person  Provider:   Oswaldo Milian, MD

## 2020-01-08 ENCOUNTER — Ambulatory Visit: Payer: 59 | Admitting: Nurse Practitioner

## 2020-01-08 ENCOUNTER — Encounter: Payer: Self-pay | Admitting: Nurse Practitioner

## 2020-01-08 ENCOUNTER — Other Ambulatory Visit: Payer: Self-pay

## 2020-01-08 VITALS — BP 135/73 | HR 79 | Temp 97.7°F | Resp 20 | Ht 65.0 in | Wt 190.0 lb

## 2020-01-08 DIAGNOSIS — L658 Other specified nonscarring hair loss: Secondary | ICD-10-CM

## 2020-01-08 NOTE — Progress Notes (Signed)
   Subjective:    Patient ID: Crystal Wong, female    DOB: 1955-05-20, 64 y.o.   MRN: [id number]   Chief Complaint: Alopecia (Going on for about a year)   HPI Pt c/o hair loss x 1 year, states its falling out more frequently and getting thinner. Believes hair loss is due to recent surgeries/recieving anesthesia. No longer taking Miralax due to a study suggesting it causing hair loss in women. Denies trying any prenatal vitamins, states too much iron and it made her sick. Does take a multivitamin every other day. Denies any BM issues/follows GI.       Review of Systems  Constitutional: Negative.   HENT: Negative.   Eyes: Negative.   Respiratory: Negative.   Cardiovascular: Negative.   Gastrointestinal: Negative.   Endocrine: Negative.   Genitourinary: Negative.   Musculoskeletal: Negative.   Skin: Negative.   Allergic/Immunologic: Negative.   Neurological: Negative.   Hematological: Negative.   Psychiatric/Behavioral: Negative.   All other systems reviewed and are negative.      Objective:   Physical Exam Vitals and nursing note reviewed.  Constitutional:      Appearance: Normal appearance.  HENT:     Head: Normocephalic and atraumatic.     Comments: No balding spots- hair does appear thinner then it was several years ago.    Right Ear: External ear normal.     Left Ear: External ear normal.     Nose: Nose normal.     Mouth/Throat:     Mouth: Mucous membranes are moist.     Pharynx: Oropharynx is clear.  Eyes:     Pupils: Pupils are equal, round, and reactive to light.  Cardiovascular:     Rate and Rhythm: Normal rate.     Pulses: Normal pulses.  Pulmonary:     Effort: Pulmonary effort is normal.  Abdominal:     General: Abdomen is flat.  Musculoskeletal:        General: Normal range of motion.     Cervical back: Normal range of motion.  Skin:    Capillary Refill: Capillary refill takes less than 2 seconds.  Neurological:     General: No focal deficit  present.     Mental Status: She is alert and oriented to person, place, and time. Mental status is at baseline.  Psychiatric:        Mood and Affect: Mood normal.        Behavior: Behavior normal.        Thought Content: Thought content normal.        Judgment: Judgment normal.    BP 135/73   Pulse 79   Temp 97.7 F (36.5 C) (Temporal)   Resp 20   Ht 5\' 5"  (1.651 m)   Wt 190 lb (86.2 kg)   SpO2 100%   BMI 31.62 kg/m        Assessment & Plan:  Crystal Wong in today with chief complaint of Alopecia (Going on for about a year)   1. hairloss Keep appointment with dermatology in February Hair thicken shampoo  The above assessment and management plan was discussed with the patient. The patient verbalized understanding of and has agreed to the management plan. Patient is aware to call the clinic if symptoms persist or worsen. Patient is aware when to return to the clinic for a follow-up visit. Patient educated on when it is appropriate to go to the emergency department.   [name] Done, FNP

## 2020-01-09 ENCOUNTER — Telehealth: Payer: Self-pay

## 2020-01-09 LAB — CBC WITH DIFFERENTIAL/PLATELET
Basophils Absolute: 0.1 10*3/uL (ref 0.0–0.2)
Basos: 1 %
EOS (ABSOLUTE): 0.2 10*3/uL (ref 0.0–0.4)
Eos: 4 %
Hematocrit: 39.4 % (ref 34.0–46.6)
Hemoglobin: 13.1 g/dL (ref 11.1–15.9)
Immature Grans (Abs): 0 10*3/uL (ref 0.0–0.1)
Immature Granulocytes: 0 %
Lymphocytes Absolute: 1.8 10*3/uL (ref 0.7–3.1)
Lymphs: 32 %
MCH: 29.4 pg (ref 26.6–33.0)
MCHC: 33.2 g/dL (ref 31.5–35.7)
MCV: 89 fL (ref 79–97)
Monocytes Absolute: 0.3 10*3/uL (ref 0.1–0.9)
Monocytes: 6 %
Neutrophils Absolute: 3.2 10*3/uL (ref 1.4–7.0)
Neutrophils: 57 %
Platelets: 283 10*3/uL (ref 150–450)
RBC: 4.45 x10E6/uL (ref 3.77–5.28)
RDW: 12.9 % (ref 11.7–15.4)
WBC: 5.6 10*3/uL (ref 3.4–10.8)

## 2020-01-09 LAB — VITAMIN D 25 HYDROXY (VIT D DEFICIENCY, FRACTURES): Vit D, 25-Hydroxy: 53 ng/mL (ref 30.0–100.0)

## 2020-01-09 LAB — THYROID PANEL WITH TSH
Free Thyroxine Index: 1.4 (ref 1.2–4.9)
T3 Uptake Ratio: 22 % — ABNORMAL LOW (ref 24–39)
T4, Total: 6.3 ug/dL (ref 4.5–12.0)
TSH: 1.34 u[IU]/mL (ref 0.450–4.500)

## 2020-01-09 NOTE — Telephone Encounter (Signed)
Labs have not been reviewed by a provider.  Once the provider reviews we will contact patient.

## 2020-01-13 NOTE — Telephone Encounter (Signed)
Notified patient that labwork was normal. She is concerned about the one thyroid level that is 2 points low. She is still losing hair and wants to know what to do from here. She is also requesting that lorazepam be increased to due to anxiety and worrying about this. Please advise

## 2020-01-13 NOTE — Telephone Encounter (Signed)
T3 is not significant , but we can sewnd her to endocrinology if sh ewants [place] to. I do not want to increase anxiety meds at this time because ethey are controlled.

## 2020-01-14 ENCOUNTER — Other Ambulatory Visit: Payer: Self-pay | Admitting: *Deleted

## 2020-01-14 DIAGNOSIS — R946 Abnormal results of thyroid function studies: Secondary | ICD-10-CM

## 2020-01-14 DIAGNOSIS — L659 Nonscarring hair loss, unspecified: Secondary | ICD-10-CM

## 2020-01-14 NOTE — Telephone Encounter (Signed)
Patient aware and would like a referral to endocrinology.  Referral placed.

## 2020-01-15 ENCOUNTER — Telehealth: Payer: Self-pay

## 2020-01-15 NOTE — Telephone Encounter (Signed)
Per Dr Mariana Arn will only benefit from Dermatology. Thanks

## 2020-01-15 NOTE — Telephone Encounter (Signed)
I think Dr. Dorris Fetch would be best for this.  Looks like primary concern is hair loss.

## 2020-01-15 NOTE — Telephone Encounter (Signed)
Dr Dorris Fetch- they sent [place] this for hair loss/ abnormal thyroid panel. Do you want me to still schedule?

## 2020-01-15 NOTE — Telephone Encounter (Signed)
Can you  see this pt ??

## 2020-01-15 NOTE — Telephone Encounter (Signed)
I saw her thyroid panel , will not need intervention. She will benefit from Dermatology consult for hair loss.

## 2020-01-15 NOTE — Telephone Encounter (Signed)
Noted. Closing referral.

## 2020-01-26 ENCOUNTER — Other Ambulatory Visit: Payer: Self-pay | Admitting: Nurse Practitioner

## 2020-01-26 DIAGNOSIS — F411 Generalized anxiety disorder: Secondary | ICD-10-CM

## 2020-02-03 ENCOUNTER — Other Ambulatory Visit: Payer: Self-pay | Admitting: Nurse Practitioner

## 2020-02-03 DIAGNOSIS — F411 Generalized anxiety disorder: Secondary | ICD-10-CM

## 2020-02-03 MED ORDER — LORAZEPAM 1 MG PO TABS: 1.0000 mg | ORAL_TABLET | Freq: Three times a day (TID) | ORAL | 0 refills | Status: DC | PRN

## 2020-02-04 ENCOUNTER — Other Ambulatory Visit: Payer: Self-pay | Admitting: Nurse Practitioner

## 2020-02-21 ENCOUNTER — Other Ambulatory Visit: Payer: Self-pay | Admitting: Nurse Practitioner

## 2020-02-21 DIAGNOSIS — E782 Mixed hyperlipidemia: Secondary | ICD-10-CM

## 2020-03-02 ENCOUNTER — Ambulatory Visit (INDEPENDENT_AMBULATORY_CARE_PROVIDER_SITE_OTHER): Payer: 59 | Admitting: Nurse Practitioner

## 2020-03-02 ENCOUNTER — Ambulatory Visit (INDEPENDENT_AMBULATORY_CARE_PROVIDER_SITE_OTHER): Payer: 59

## 2020-03-02 ENCOUNTER — Encounter: Payer: Self-pay | Admitting: Nurse Practitioner

## 2020-03-02 ENCOUNTER — Other Ambulatory Visit: Payer: Self-pay

## 2020-03-02 VITALS — BP 130/71 | HR 75 | Temp 98.0°F | Resp 20 | Ht 65.0 in | Wt 196.0 lb

## 2020-03-02 DIAGNOSIS — F411 Generalized anxiety disorder: Secondary | ICD-10-CM

## 2020-03-02 DIAGNOSIS — Z0000 Encounter for general adult medical examination without abnormal findings: Secondary | ICD-10-CM

## 2020-03-02 DIAGNOSIS — Z0001 Encounter for general adult medical examination with abnormal findings: Secondary | ICD-10-CM

## 2020-03-02 DIAGNOSIS — E782 Mixed hyperlipidemia: Secondary | ICD-10-CM

## 2020-03-02 DIAGNOSIS — Z6833 Body mass index (BMI) 33.0-33.9, adult: Secondary | ICD-10-CM

## 2020-03-02 DIAGNOSIS — K2100 Gastro-esophageal reflux disease with esophagitis, without bleeding: Secondary | ICD-10-CM

## 2020-03-02 DIAGNOSIS — F3342 Major depressive disorder, recurrent, in full remission: Secondary | ICD-10-CM

## 2020-03-02 DIAGNOSIS — F5101 Primary insomnia: Secondary | ICD-10-CM

## 2020-03-02 DIAGNOSIS — J4541 Moderate persistent asthma with (acute) exacerbation: Secondary | ICD-10-CM

## 2020-03-02 MED ORDER — CITALOPRAM HYDROBROMIDE 40 MG PO TABS: ORAL_TABLET | ORAL | 1 refills | Status: DC

## 2020-03-02 MED ORDER — DEXLANSOPRAZOLE 60 MG PO CPDR: 60.0000 mg | DELAYED_RELEASE_CAPSULE | Freq: Every day | ORAL | 1 refills | Status: DC

## 2020-03-02 MED ORDER — ROSUVASTATIN CALCIUM 40 MG PO TABS: 40.0000 mg | ORAL_TABLET | Freq: Every day | ORAL | 1 refills | Status: DC

## 2020-03-02 MED ORDER — REPATHA SURECLICK 140 MG/ML ~~LOC~~ SOAJ: 140.0000 mg | SUBCUTANEOUS | 11 refills | Status: DC

## 2020-03-02 MED ORDER — FAMOTIDINE 20 MG PO TABS: 20.0000 mg | ORAL_TABLET | Freq: Two times a day (BID) | ORAL | 1 refills | Status: DC

## 2020-03-02 MED ORDER — LORAZEPAM 1 MG PO TABS: 1.0000 mg | ORAL_TABLET | Freq: Three times a day (TID) | ORAL | 0 refills | Status: DC | PRN

## 2020-03-02 NOTE — Patient Instructions (Signed)
Exercising to Stay Healthy To become healthy and stay healthy, it is recommended that you do moderate-intensity and vigorous-intensity exercise. You can tell that you are exercising at a moderate intensity if your heart starts beating faster and you start breathing faster but can still hold a conversation. You can tell that you are exercising at a vigorous intensity if you are breathing much harder and faster and cannot hold a conversation while exercising. Exercising regularly is important. It has many health benefits, such as:  Improving overall fitness, flexibility, and endurance.  Increasing bone density.  Helping with weight control.  Decreasing body fat.  Increasing muscle strength.  Reducing stress and tension.  Improving overall health. How often should I exercise? Choose an activity that you enjoy, and set realistic goals. Your health care provider can help you make an activity plan that works for you. Exercise regularly as told by your health care provider. This may include:  Doing strength training two times a week, such as: ? Lifting weights. ? Using resistance bands. ? Push-ups. ? Sit-ups. ? Yoga.  Doing a certain intensity of exercise for a given amount of time. Choose from these options: ? A total of 150 minutes of moderate-intensity exercise every week. ? A total of 75 minutes of vigorous-intensity exercise every week. ? A mix of moderate-intensity and vigorous-intensity exercise every week. Children, pregnant women, people who have not exercised regularly, people who are overweight, and older adults may need to talk with a health care provider about what activities are safe to do. If you have a medical condition, be sure to talk with your health care provider before you start a new exercise program. What are some exercise ideas? Moderate-intensity exercise ideas include:  Walking 1 mile (1.6 km) in about 15  minutes.  Biking.  Hiking.  Golfing.  Dancing.  Water aerobics. Vigorous-intensity exercise ideas include:  Walking 4.5 miles (7.2 km) or more in about 1 hour.  Jogging or running 5 miles (8 km) in about 1 hour.  Biking 10 miles (16.1 km) or more in about 1 hour.  Lap swimming.  Roller-skating or in-line skating.  Cross-country skiing.  Vigorous competitive sports, such as football, basketball, and soccer.  Jumping rope.  Aerobic dancing. What are some everyday activities that can help me to get exercise?  Yard work, such as: ? Pushing a lawn mower. ? Raking and bagging leaves.  Washing your car.  Pushing a stroller.  Shoveling snow.  Gardening.  Washing windows or floors. How can I be more active in my day-to-day activities?  Use stairs instead of an elevator.  Take a walk during your lunch break.  If you drive, park your car farther away from your work or school.  If you take public transportation, get off one stop early and walk the rest of the way.  Stand up or walk around during all of your indoor phone calls.  Get up, stretch, and walk around every 30 minutes throughout the day.  Enjoy exercise with a friend. Support to continue exercising will help you keep a regular routine of activity. What guidelines can I follow while exercising?  Before you start a new exercise program, talk with your health care provider.  Do not exercise so much that you hurt yourself, feel dizzy, or get very short of breath.  Wear comfortable clothes and wear shoes with good support.  Drink plenty of water while you exercise to prevent dehydration or heat stroke.  Work out until your breathing   and your heartbeat get faster. Where to find more information  [place] Department of Health and Human Services: www.hhs.gov  Centers for Disease Control and Prevention (CDC): www.cdc.gov Summary  Exercising regularly is important. It will improve your overall fitness,  flexibility, and endurance.  Regular exercise also will improve your overall health. It can help you control your weight, reduce stress, and improve your bone density.  Do not exercise so much that you hurt yourself, feel dizzy, or get very short of breath.  Before you start a new exercise program, talk with your health care provider. This information is not intended to replace advice given to you by your health care provider. Make sure you discuss any questions you have with your health care provider. Document Revised: 02/02/2017 Document Reviewed: 01/11/2017 Elsevier Patient Education  2020 [employer].  

## 2020-03-02 NOTE — Progress Notes (Signed)
Subjective:    Patient ID: Crystal Wong, female    DOB: 02-24-1956, 64 y.o.   MRN: [id number]   Chief Complaint: Annual Exam (No pap/)    HPI:  1. Annual physical exam No PAP today  2. GAD (generalized anxiety disorder) Is on ativan and takes  On as needed basis. She usually only takes 1/2tablet when needed. GAD 7 : Generalized Anxiety Score 03/02/2020 08/28/2019 02/20/2019  Nervous, Anxious, on Edge 0 0 0  Control/stop worrying 0 0 1  Worry too much - different things 0 0 0  Trouble relaxing 0 0 0  Restless 0 0 0  Easily annoyed or irritable 0 1 0  Afraid - awful might happen 0 0 0  Total GAD 7 Score 0 1 1  Anxiety Difficulty Not difficult at all Not difficult at all Not difficult at all       3. Gastroesophageal reflux disease with esophagitis without hemorrhage Is on dexilant and pepcid daily. combination works well for her.  4. Mixed hyperlipidemia Tries to watch diet. Is on repatha daily and is working well. Lab Results  Component Value Date   CHOL 143 12/24/2019   HDL 53 12/24/2019   LDLCALC 75 12/24/2019   TRIG 76 12/24/2019   CHOLHDL 2.7 12/24/2019     5. Recurrent major depressive disorder, in full remission [facility]) She is on celexa daily and is doing well. Lab Results  Component Value Date   CHOL 143 12/24/2019   HDL 53 12/24/2019   LDLCALC 75 12/24/2019   TRIG 76 12/24/2019   CHOLHDL 2.7 12/24/2019     6. Moderate persistent asthma with acute exacerbation She has had  No recent flareup  7. Primary insomnia She is sleeping well since she retired  77. BMI 33.0-33.9,adult Weight is up 6lbs Wt Readings from Last 3 Encounters:  03/02/20 196 lb (88.9 kg)  01/08/20 190 lb (86.2 kg)  12/29/19 194 lb (88 kg)   BMI Readings from Last 3 Encounters:  03/02/20 32.62 kg/m  01/08/20 31.62 kg/m  12/29/19 32.28 kg/m        Outpatient Encounter Medications as of 03/02/2020  Medication Sig  . Ascorbic Acid (VITAMIN C ADULT GUMMIES PO) Take 1  each by mouth daily.   . citalopram (CELEXA) 40 MG tablet TAKE 1 TABLET BY MOUTH EVERY DAY  . dexlansoprazole (DEXILANT) 60 MG capsule Take 1 capsule (60 mg total) by mouth daily.  . Evolocumab (REPATHA SURECLICK) 073 MG/ML SOAJ Inject 140 mg into the skin every 14 (fourteen) days.  . famotidine (PEPCID) 20 MG tablet Take 1 tablet (20 mg total) by mouth 2 (two) times daily.  . fluticasone (FLONASE) 50 MCG/ACT nasal spray SPRAY 2 SPRAYS INTO EACH NOSTRIL EVERY DAY (Patient taking differently: Place 1 spray into both nostrils as needed.)  . hydrocortisone (ANUSOL-HC) 25 MG suppository PLACE 1 SUPPOSITORY (25 MG TOTAL) RECTALLY 2 (TWO) TIMES DAILY. (Patient taking differently: Place 25 mg rectally as needed.)  . loratadine-pseudoephedrine (LORATADINE-D 24HR) 10-240 MG 24 hr tablet Take 1 tablet by mouth daily.  Marland Kitchen LORazepam (ATIVAN) 1 MG tablet Take 1 tablet (1 mg total) by mouth every 8 (eight) hours as needed.  . Pyridoxine HCl (VITAMIN B6 PO) Take 1 each by mouth daily.   . rosuvastatin (CRESTOR) 40 MG tablet TAKE 1 TABLET BY MOUTH EVERY DAY  . VENTOLIN HFA 108 (90 Base) MCG/ACT inhaler INHALE 2 PUFFS BY MOUTH EVERY 6 HOURS AS NEEDED FOR WHEEZING  . VITAMIN D, CHOLECALCIFEROL,  PO Take 1 tablet by mouth daily.      Past Surgical History:  Procedure Laterality Date  . ANKLE FRACTURE SURGERY Left    Pins and screws  . BREAST LUMPECTOMY Bilateral    left x 1, right x 2  . CARPAL TUNNEL RELEASE Right   . CARPAL TUNNEL RELEASE Left   . COLONOSCOPY    . KNEE ARTHROSCOPY Bilateral   . PARTIAL HYSTERECTOMY      Family History  Problem Relation Age of Onset  . Colon cancer Father 71  . Heart disease Mother   . Diabetes Mother   . Colon polyps Mother   . Gallstones Paternal Grandmother   . Gallbladder disease Daughter   . Esophageal cancer Neg Hx   . Stomach cancer Neg Hx   . Rectal cancer Neg Hx     New complaints: None today  Social history: Lives with husband. Has retired for  [employer] substance contract: n/a    Review of Systems  Constitutional: Negative for diaphoresis.  Eyes: Negative for pain.  Respiratory: Negative for shortness of breath.   Cardiovascular: Negative for chest pain, palpitations and leg swelling.  Gastrointestinal: Negative for abdominal pain.  Endocrine: Negative for polydipsia.  Skin: Negative for rash.  Neurological: Negative for dizziness, weakness and headaches.  Hematological: Does not bruise/bleed easily.  All other systems reviewed and are negative.      Objective:   Physical Exam Vitals and nursing note reviewed.  Constitutional:      General: She is not in acute distress.    Appearance: Normal appearance. She is well-developed and well-nourished.  HENT:     Head: Normocephalic.     Nose: Nose normal.     Mouth/Throat:     Mouth: Oropharynx is clear and moist.  Eyes:     Extraocular Movements: EOM normal.     Pupils: Pupils are equal, round, and reactive to light.  Neck:     Vascular: No carotid bruit or JVD.  Cardiovascular:     Rate and Rhythm: Normal rate and regular rhythm.     Pulses: Intact distal pulses.     Heart sounds: Normal heart sounds.  Pulmonary:     Effort: Pulmonary effort is normal. No respiratory distress.     Breath sounds: Normal breath sounds. No wheezing or rales.  Chest:     Chest wall: No tenderness.  Abdominal:     General: Bowel sounds are normal. There is no distension or abdominal bruit. Aorta is normal.     Palpations: Abdomen is soft. There is no hepatomegaly, splenomegaly, mass or pulsatile mass.     Tenderness: There is no abdominal tenderness.  Musculoskeletal:        General: No edema. Normal range of motion.     Cervical back: Normal range of motion and neck supple.  Lymphadenopathy:     Cervical: No cervical adenopathy.  Skin:    General: Skin is warm and dry.  Neurological:     Mental Status: She is alert and oriented to person, place, and time.      Deep Tendon Reflexes: Reflexes are normal and symmetric.  Psychiatric:        Mood and Affect: Mood and affect normal.        Behavior: Behavior normal.        Thought Content: Thought content normal.        Judgment: Judgment normal.    BP 130/71   Pulse 75  Temp 98 F (36.7 C) (Temporal)   Resp 20   Ht 5' 5"  (3.893 m)   Wt 196 lb (88.9 kg)   SpO2 96%   BMI 32.62 kg/m         Assessment & Plan:  Crystal Wong comes in today with chief complaint of Annual Exam (No pap/)   Diagnosis and orders addressed:  1. Annual physical exam - CBC with Differential/Platelet - Thyroid Panel With TSH - DG Chest 2 View  2. GAD (generalized anxiety disorder) Stress management - LORazepam (ATIVAN) 1 MG tablet; Take 1 tablet (1 mg total) by mouth every 8 (eight) hours as needed.  Dispense: 30 tablet; Refill: 0  3. Gastroesophageal reflux disease with esophagitis without hemorrhage Avoid spicy foods Do not eat 2 hours prior to bedtime - dexlansoprazole (DEXILANT) 60 MG capsule; Take 1 capsule (60 mg total) by mouth daily.  Dispense: 90 capsule; Refill: 1 - famotidine (PEPCID) 20 MG tablet; Take 1 tablet (20 mg total) by mouth 2 (two) times daily.  Dispense: 180 tablet; Refill: 1  4. Mixed hyperlipidemia Low fat diet - rosuvastatin (CRESTOR) 40 MG tablet; Take 1 tablet (40 mg total) by mouth daily.  Dispense: 90 tablet; Refill: 1 - Evolocumab (REPATHA SURECLICK) 734 MG/ML SOAJ; Inject 140 mg into the skin every 14 (fourteen) days.  Dispense: 2 mL; Refill: 11 - CMP14+EGFR - Lipid panel  5. Recurrent major depressive disorder, in full remission ([place]) Stress management - citalopram (CELEXA) 40 MG tablet; TAKE 1 TABLET BY MOUTH EVERY DAY  Dispense: 90 tablet; Refill: 1  6. Moderate persistent asthma with acute exacerbation  7. Primary insomnia Bedtime routine  8. BMI 33.0-33.9,adult Discussed diet and exercise for person with BMI >25 Will recheck weight in 3-6 months    Labs  pending Health Maintenance reviewed Diet and exercise encouraged  Follow up plan: 6 months   [name] Done, FNP

## 2020-03-03 LAB — LIPID PANEL
Chol/HDL Ratio: 2.4 ratio (ref 0.0–4.4)
Cholesterol, Total: 167 mg/dL (ref 100–199)
HDL: 71 mg/dL (ref >39)
LDL Chol Calc (NIH): 75 mg/dL (ref 0–99)
Triglycerides: 123 mg/dL (ref 0–149)
VLDL Cholesterol Cal: 21 mg/dL (ref 5–40)

## 2020-03-03 LAB — CBC WITH DIFFERENTIAL/PLATELET
Basophils Absolute: 0.1 10*3/uL (ref 0.0–0.2)
Basos: 1 %
EOS (ABSOLUTE): 0.2 10*3/uL (ref 0.0–0.4)
Eos: 3 %
Hematocrit: 40.7 % (ref 34.0–46.6)
Hemoglobin: 13.9 g/dL (ref 11.1–15.9)
Immature Grans (Abs): 0 10*3/uL (ref 0.0–0.1)
Immature Granulocytes: 1 %
Lymphocytes Absolute: 1.7 10*3/uL (ref 0.7–3.1)
Lymphs: 29 %
MCH: 29.2 pg (ref 26.6–33.0)
MCHC: 34.2 g/dL (ref 31.5–35.7)
MCV: 86 fL (ref 79–97)
Monocytes Absolute: 0.4 10*3/uL (ref 0.1–0.9)
Monocytes: 7 %
Neutrophils Absolute: 3.4 10*3/uL (ref 1.4–7.0)
Neutrophils: 59 %
Platelets: 274 10*3/uL (ref 150–450)
RBC: 4.76 x10E6/uL (ref 3.77–5.28)
RDW: 13.1 % (ref 11.7–15.4)
WBC: 5.8 10*3/uL (ref 3.4–10.8)

## 2020-03-03 LAB — CMP14+EGFR
ALT: 16 IU/L (ref 0–32)
AST: 19 IU/L (ref 0–40)
Albumin/Globulin Ratio: 2 (ref 1.2–2.2)
Albumin: 4.6 g/dL (ref 3.8–4.8)
Alkaline Phosphatase: 94 IU/L (ref 44–121)
BUN/Creatinine Ratio: 21 (ref 12–28)
BUN: 18 mg/dL (ref 8–27)
Bilirubin Total: 0.4 mg/dL (ref 0.0–1.2)
CO2: 25 mmol/L (ref 20–29)
Calcium: 10.1 mg/dL (ref 8.7–10.3)
Chloride: 101 mmol/L (ref 96–106)
Creatinine, Ser: 0.87 mg/dL (ref 0.57–1.00)
GFR calc Af Amer: 83 mL/min/{1.73_m2} (ref >59)
GFR calc non Af Amer: 72 mL/min/{1.73_m2} (ref >59)
Globulin, Total: 2.3 g/dL (ref 1.5–4.5)
Glucose: 120 mg/dL — ABNORMAL HIGH (ref 65–99)
Potassium: 4.7 mmol/L (ref 3.5–5.2)
Sodium: 139 mmol/L (ref 134–144)
Total Protein: 6.9 g/dL (ref 6.0–8.5)

## 2020-03-03 LAB — THYROID PANEL WITH TSH
Free Thyroxine Index: 1.7 (ref 1.2–4.9)
T3 Uptake Ratio: 27 % (ref 24–39)
T4, Total: 6.2 ug/dL (ref 4.5–12.0)
TSH: 1.25 u[IU]/mL (ref 0.450–4.500)

## 2020-03-07 ENCOUNTER — Telehealth: Payer: 59 | Admitting: Family

## 2020-03-07 DIAGNOSIS — J069 Acute upper respiratory infection, unspecified: Secondary | ICD-10-CM

## 2020-03-07 DIAGNOSIS — J45909 Unspecified asthma, uncomplicated: Secondary | ICD-10-CM

## 2020-03-07 MED ORDER — PREDNISONE 10 MG (21) PO TBPK
ORAL_TABLET | ORAL | 0 refills | Status: DC
Start: 2020-03-07 — End: 2020-04-23

## 2020-03-07 MED ORDER — FLUTICASONE PROPIONATE 50 MCG/ACT NA SUSP: 2.0000 | Freq: Every day | NASAL | 6 refills | Status: DC

## 2020-03-07 MED ORDER — BENZONATATE 100 MG PO CAPS: 100.0000 mg | ORAL_CAPSULE | Freq: Three times a day (TID) | ORAL | 0 refills | Status: DC | PRN

## 2020-03-07 NOTE — Progress Notes (Signed)
We are sorry you are not feeling well.  Here is how we plan to help!  Based on what you have shared with me, it looks like you may have a viral upper respiratory infection.  Upper respiratory infections are caused by a large number of viruses; however, rhinovirus is the most common cause.   Symptoms vary from person to person, with common symptoms including sore throat, cough, fatigue or lack of energy and feeling of general discomfort.  A low-grade fever of up to 100.4 may present, but is often uncommon.  Symptoms vary however, and are closely related to a person's age or underlying illnesses.  The most common symptoms associated with an upper respiratory infection are nasal discharge or congestion, cough, sneezing, headache and pressure in the ears and face.  These symptoms usually persist for about 3 to 10 days, but can last up to 2 weeks.  It is important to know that upper respiratory infections do not cause serious illness or complications in most cases.    Upper respiratory infections can be transmitted from person to person, with the most common method of transmission being a person's hands.  The virus is able to live on the skin and can infect other persons for up to 2 hours after direct contact.  Also, these can be transmitted when someone coughs or sneezes; thus, it is important to cover the mouth to reduce this risk.  To keep the spread of the illness at bay, good hand hygiene is very important.  This is an infection that is most likely caused by a virus. There are no specific treatments other than to help you with the symptoms until the infection runs its course.  We are sorry you are not feeling well.  Here is how we plan to help!   For nasal congestion, you may use an oral decongestants such as Mucinex D or if you have glaucoma or high blood pressure use plain Mucinex.  Saline nasal spray or nasal drops can help and can safely be used as often as needed for congestion.  For your congestion,  I have prescribed Fluticasone nasal spray one spray in each nostril twice a day  If you do not have a history of heart disease, hypertension, diabetes or thyroid disease, prostate/bladder issues or glaucoma, you may also use Sudafed to treat nasal congestion.  It is highly recommended that you consult with a pharmacist or your primary care physician to ensure this medication is safe for you to take.     If you have a cough, you may use cough suppressants such as Delsym and Robitussin.  If you have glaucoma or high blood pressure, you can also use Coricidin HBP.   For cough I have prescribed for you A prescription cough medication called Tessalon Perles 100 mg. You may take 1-2 capsules every 8 hours as needed for cough and prednisone dose pack.   If you have a sore or scratchy throat, use a saltwater gargle-  to  teaspoon of salt dissolved in a 4-ounce to 8-ounce glass of warm water.  Gargle the solution for approximately 15-30 seconds and then spit.  It is important not to swallow the solution.  You can also use throat lozenges/cough drops and Chloraseptic spray to help with throat pain or discomfort.  Warm or cold liquids can also be helpful in relieving throat pain.  For headache, pain or general discomfort, you can use Ibuprofen or Tylenol as directed.   Some authorities believe that zinc   sprays or the use of Echinacea may shorten the course of your symptoms.   HOME CARE . Only take medications as instructed by your medical team. . Be sure to drink plenty of fluids. Water is fine as well as fruit juices, sodas and electrolyte beverages. You may want to stay away from caffeine or alcohol. If you are nauseated, try taking small sips of liquids. How do you know if you are getting enough fluid? Your urine should be a pale yellow or almost colorless. . Get rest. . Taking a steamy shower or using a humidifier may help nasal congestion and ease sore throat pain. You can place a towel over your head  and breathe in the steam from hot water coming from a faucet. . Using a saline nasal spray works much the same way. . Cough drops, hard candies and sore throat lozenges may ease your cough. . Avoid close contacts especially the very young and the elderly . Cover your mouth if you cough or sneeze . Always remember to wash your hands.   GET HELP RIGHT AWAY IF: . You develop worsening fever. . If your symptoms do not improve within 10 days . You develop yellow or green discharge from your nose over 3 days. . You have coughing fits . You develop a severe head ache or visual changes. . You develop shortness of breath, difficulty breathing or start having chest pain . Your symptoms persist after you have completed your treatment plan  MAKE SURE YOU   Understand these instructions.  Will watch your condition.  Will get help right away if you are not doing well or get worse.  Your e-visit answers were reviewed by a board certified advanced clinical practitioner to complete your personal care plan. Depending upon the condition, your plan could have included both over the counter or prescription medications. Please review your pharmacy choice. If there is a problem, you may call our nursing hot line at and have the prescription routed to another pharmacy. Your safety is important to us. If you have drug allergies check your prescription carefully.   You can use MyChart to ask questions about today's visit, request a non-urgent call back, or ask for a work or school excuse for 24 hours related to this e-Visit. If it has been greater than 24 hours you will need to follow up with your provider, or enter a new e-Visit to address those concerns. You will get an e-mail in the next two days asking about your experience.  I hope that your e-visit has been valuable and will speed your recovery. Thank you for using e-visits.  Approximately 5 minutes was spent documenting and reviewing patient's chart.       

## 2020-03-24 ENCOUNTER — Other Ambulatory Visit: Payer: Self-pay | Admitting: Nurse Practitioner

## 2020-03-24 DIAGNOSIS — Z1231 Encounter for screening mammogram for malignant neoplasm of breast: Secondary | ICD-10-CM

## 2020-04-22 ENCOUNTER — Telehealth: Payer: Self-pay | Admitting: Cardiology

## 2020-04-22 NOTE — Progress Notes (Unsigned)
Cardiology Office Note:    Date:  04/23/2020   ID:  Crystal Wong, DOB Jan 27, 1956, MRN [id number]  PCP:  Chevis Pretty, FNP  Cardiologist:  Donato Heinz, MD  Electrophysiologist:  None   Referring MD: Chevis Pretty, *   Chief Complaint  Patient presents with  . Follow-up    Palpitations.    History of Present Illness:    Crystal Wong is a 65 y.o. female with a hx of anxiety, depression, asthma, GERD, hyperlipidemia who presents for follow-up.  She was referred by Dr. Tarri Glenn for pre-op evaluation, initially seen on 05/19/2019.  She was referred to gastroenterology for EGD, requested cardiac evaluation prior to procedure.  She reports that she has been having a fluttering feeling in her chest that started in January.  Was occurring every day, but has not had for the last week.  Can last for 20 minutes up to hours.  In addition to chest fluttering, she sometimes avoids episodes where she feels like her heart is racing, but states that this is related to caffeine use.  Has also been having burning in her chest that she has attributed to GERD.  Has improved with increasing her GERD medications.  Reports that she exercises regularly, walks 4 miles per day and does Pilates for 30 minutes.  Denies any exertional chest pain.  Does report some dyspnea with walking up hills.  Never smoked.  Mother had MI at 59.   Reports she was told years ago she had mitral valve prolapse after she was evaluated for heart murmur  At initial clinic visit on 05/19/2019, no further work-up was recommended prior to her EGD, which hse underwent on 06/09/2019.  TTE on 06/03/2019 showed LVEF 60 to 65%, normal RV function, mild aortic stenosis, mild to moderate aortic regurgitation.  Zio patch x14 days on 07/17/2019 showed no significant arrhythmias.  Calcium score on 06/03/2019 was 0.  LDL 205 on 08/19/2019 despite being on rosuvastatin 40 mg daily.  Referred to lipid clinic, started on [place].  Repeat lipid  panel on 12/24/2019 showed marked improvement in LDL to 75.  Since last clinic visit, she reports that she has been under a lot of stress as her husband was hospitalized with atrial fibrillation. She reports that she has been having palpitations frequently. Yesterday had 10 episodes, palpitations occurred off and on up to about a minute. Reports this feels different than the palpitations she was having last year for which he wore a monitor and there were no significant arrhythmias. She denies any chest pain, dyspnea, lightheadedness, syncope, or lower extremity edema.   Past Medical History:  Diagnosis Date  . Anxiety   . Asthma   . Depression   . GERD (gastroesophageal reflux disease)   . Heart murmur   . Hyperlipidemia   . Insomnia     Past Surgical History:  Procedure Laterality Date  . ANKLE FRACTURE SURGERY Left    Pins and screws  . BREAST LUMPECTOMY Bilateral    left x 1, right x 2  . CARPAL TUNNEL RELEASE Right   . CARPAL TUNNEL RELEASE Left   . COLONOSCOPY    . KNEE ARTHROSCOPY Bilateral   . PARTIAL HYSTERECTOMY      Current Medications: Current Meds  Medication Sig  . betamethasone dipropionate 0.05 % lotion Apply topically 2 (two) times daily.  . citalopram (CELEXA) 40 MG tablet TAKE 1 TABLET BY MOUTH EVERY DAY  . dexlansoprazole (DEXILANT) 60 MG capsule Take 1 capsule (60  mg total) by mouth daily.  Marland Kitchen doxycycline (VIBRAMYCIN) 50 MG capsule Take 50 mg by mouth daily.  . Evolocumab (REPATHA SURECLICK) 767 MG/ML SOAJ Inject 140 mg into the skin every 14 (fourteen) days.  . famotidine (PEPCID) 20 MG tablet Take 1 tablet (20 mg total) by mouth 2 (two) times daily.  . fluticasone (FLONASE) 50 MCG/ACT nasal spray Place 2 sprays into both nostrils daily.  . hydrocortisone (ANUSOL-HC) 25 MG suppository PLACE 1 SUPPOSITORY (25 MG TOTAL) RECTALLY 2 (TWO) TIMES DAILY. (Patient taking differently: Place 25 mg rectally as needed.)  . loratadine-pseudoephedrine (LORATADINE-D 24HR)  10-240 MG 24 hr tablet Take 1 tablet by mouth daily.  Marland Kitchen LORazepam (ATIVAN) 1 MG tablet Take 1 tablet (1 mg total) by mouth every 8 (eight) hours as needed.  . rosuvastatin (CRESTOR) 40 MG tablet Take 1 tablet (40 mg total) by mouth daily.  . VENTOLIN HFA 108 (90 Base) MCG/ACT inhaler INHALE 2 PUFFS BY MOUTH EVERY 6 HOURS AS NEEDED FOR WHEEZING  . VITAMIN D, CHOLECALCIFEROL, PO Take 1 tablet by mouth daily.   . [DISCONTINUED] Ascorbic Acid (VITAMIN C ADULT GUMMIES PO) Take 1 each by mouth daily.   . [DISCONTINUED] Pyridoxine HCl (VITAMIN B6 PO) Take 1 each by mouth daily.      Allergies:   Sulfa antibiotics, Sulfamethoxazole-trimethoprim, and Elemental sulfur   Social History   Socioeconomic History  . Marital status: Married    Spouse name: Not on file  . Number of children: 2  . Years of education: Not on file  . Highest education level: Not on file  Occupational History  . Occupation: retired  Tobacco Use  . Smoking status: Never Smoker  . Smokeless tobacco: Never Used  Vaping Use  . Vaping Use: Never used  Substance and Sexual Activity  . Alcohol use: Not Currently  . Drug use: No  . Sexual activity: Not on file  Other Topics Concern  . Not on file  Social History Narrative  . Not on file   Social Determinants of Health   Financial Resource Strain: Not on file  Food Insecurity: Not on file  Transportation Needs: Not on file  Physical Activity: Not on file  Stress: Not on file  Social Connections: Not on file     Family History: The patient's family history includes Colon cancer (age of onset: 36) in her father; Colon polyps in her mother; Diabetes in her mother; Gallbladder disease in her daughter; Gallstones in her paternal grandmother; Heart disease in her mother. There is no history of Esophageal cancer, Stomach cancer, or Rectal cancer.  ROS:   Please see the history of present illness.     All other systems reviewed and are negative.  EKGs/Labs/Other  Studies Reviewed:    The following studies were reviewed today:   EKG:  EKG is ordered today.  The ekg ordered  demonstrates normal sinus rhythm, rate 79, RBBB, no ST/T wave abnormalities  Recent Labs: 03/02/2020: ALT 16; BUN 18; Creatinine, Ser 0.87; Hemoglobin 13.9; Platelets 274; Potassium 4.7; Sodium 139; TSH 1.250  Recent Lipid Panel    Component Value Date/Time   CHOL 167 03/02/2020 1030   CHOL 225 (H) 08/23/2012 1155   TRIG 123 03/02/2020 1030   TRIG 125 12/23/2013 1004   TRIG 51 08/23/2012 1155   HDL 71 03/02/2020 1030   HDL 58 12/23/2013 1004   HDL 59 08/23/2012 1155   CHOLHDL 2.4 03/02/2020 1030   LDLCALC 75 03/02/2020 1030   LDLCALC 163 (  H) 12/23/2013 1004   LDLCALC 156 (H) 08/23/2012 1155    Physical Exam:    VS:  BP 124/68 (BP Location: Left Arm, Patient Position: Sitting, Cuff Size: Normal)   Pulse 79   Ht 5\' 5"  (1.651 m)   Wt 195 lb (88.5 kg)   BMI 32.45 kg/m     Wt Readings from Last 3 Encounters:  04/23/20 195 lb (88.5 kg)  03/02/20 196 lb (88.9 kg)  01/08/20 190 lb (86.2 kg)     GEN:  Well nourished, well developed in no acute distress HEENT: Normal NECK: No JVD; No carotid bruits LYMPHATICS: No lymphadenopathy CARDIAC: RRR, 2/6 systolic heart murmur RESPIRATORY:  Clear to auscultation without rales, wheezing or rhonchi  ABDOMEN: Soft, non-tender, non-distended MUSCULOSKELETAL:  No edema; No deformity  SKIN: Warm and dry NEUROLOGIC:  Alert and oriented x 3 PSYCHIATRIC:  Normal affect   ASSESSMENT:    1. Palpitations   2. Aortic valve insufficiency, etiology of cardiac valve disease unspecified   3. Mild aortic stenosis   4. Mixed hyperlipidemia    PLAN:    Palpitations: Zio patch x14 days on 07/17/2019 showed no significant arrhythmias. Reports now having frequent palpitations, different in character than palpitation she was having last year. Will check Zio patch x3 days  Aortic stenosis/regurgitation:  TTE on 06/03/2019 showed LVEF 60  to 65%, normal RV function, mild aortic stenosis, mild to moderate aortic regurgitation.  Will monitor with repeat echo in March 2022  Hyperlipidemia: LDL 205 on 08/19/2019 despite being on rosuvastatin 40 mg daily.  Referred to lipid clinic, started on [place].  Repeat lipid panel on 12/24/2019 showed marked improvement in LDL to 75.  RTC in 3 months    Medication Adjustments/Labs and Tests Ordered: Current medicines are reviewed at length with the patient today.  Concerns regarding medicines are outlined above.  Orders Placed This Encounter  Procedures  . LONG TERM MONITOR (3-14 DAYS)  . EKG 12-Lead   No orders of the defined types were placed in this encounter.   Patient Instructions  Medication Instructions:  Your physician recommends that you continue on your current medications as directed. Please refer to the Current Medication list given to you today.  *If you need a refill on your cardiac medications before your next appointment, please call your pharmacy*  Testing/Procedures: Echocardiogram as scheduled  ZIO XT- Long Term Monitor Instructions   Your physician has requested you wear your ZIO patch monitor 3 days.   This is a single patch monitor.  [employer] supplies one patch monitor per enrollment.  Additional stickers are not available.   Please do not apply patch if you will be having a Nuclear Stress Test, Echocardiogram, Cardiac CT, MRI, or Chest Xray during the time frame you would be wearing the monitor. The patch cannot be worn during these tests.  You cannot remove and re-apply the ZIO XT patch monitor.   Your ZIO patch monitor will be sent USPS Priority mail from [facility] directly to your home address. The monitor may also be mailed to a PO BOX if home delivery is not available.   It may take 3-5 days to receive your monitor after you have been enrolled.   Once you have received you monitor, please review enclosed instructions.  Your monitor has  already been registered assigning a specific monitor serial # to you.   Applying the monitor   Shave hair from upper left chest.   Hold abrader disc by orange tab.  Rub abrader in 40 strokes over left upper chest as indicated in your monitor instructions.   Clean area with 4 enclosed alcohol pads .  Use all pads to assure are is cleaned thoroughly.  Let dry.   Apply patch as indicated in monitor instructions.  Patch will be place under collarbone on left side of chest with arrow pointing upward.   Rub patch adhesive wings for 2 minutes.Remove white label marked "1".  Remove white label marked "2".  Rub patch adhesive wings for 2 additional minutes.   While looking in a mirror, press and release button in center of patch.  A small green light will flash 3-4 times .  This will be your only indicator the monitor has been turned on.     Do not shower for the first 24 hours.  You may shower after the first 24 hours.   Press button if you feel a symptom. You will hear a small click.  Record Date, Time and Symptom in the Patient Log Book.   When you are ready to remove patch, follow instructions on last 2 pages of Patient Log Book.  Stick patch monitor onto last page of Patient Log Book.   Place Patient Log Book in [place] box.  Use locking tab on box and tape box closed securely.  The [employer] has [employer] on it.  Please place in mailbox as soon as possible.  Your physician should have your test results approximately 7 days after the monitor has been mailed back to [facility].   Call [place] at [phone] if you have questions regarding your ZIO XT patch monitor.  Call them immediately if you see an orange light blinking on your monitor.   If your monitor falls off in less than 4 days contact our Monitor department at [phone].  If your monitor becomes loose or falls off after 4 days call [employer] at [phone] for suggestions on securing your  monitor.     Follow-Up: At [facility], you and your health needs are our priority.  As part of our continuing mission to provide you with exceptional heart care, we have created designated Provider Care Teams.  These Care Teams include your primary Cardiologist (physician) and Advanced Practice Providers (APPs -  Physician Assistants and Nurse Practitioners) who all work together to provide you with the care you need, when you need it.  We recommend signing up for the patient portal called "MyChart".  Sign up information is provided on this After Visit Summary.  MyChart is used to connect with patients for Virtual Visits (Telemedicine).  Patients are able to view lab/test results, encounter notes, upcoming appointments, etc.  Non-urgent messages can be sent to your provider as well.   To learn more about what you can do with MyChart, go to [email or web address].    Your next appointment:   3 month(s)  The format for your next appointment:   In Person  Provider:   Oswaldo Milian, MD         Signed, Donato Heinz, MD  04/23/2020 9:36 AM    [place]

## 2020-04-22 NOTE — Telephone Encounter (Signed)
Patient c/o Palpitations:  High priority if patient c/o lightheadedness, shortness of breath, or chest pain  1) How long have you had palpitations/irregular HR/ Afib? Are you having the symptoms now? Started yesterday, not having now  2) Are you currently experiencing lightheadedness, SOB or CP? No   3) Do you have a history of afib (atrial fibrillation) or irregular heart rhythm? Not sure   4) Have you checked your BP or HR? (document readings if available):   04/22/20 while on the phone 159/79   5) Are you experiencing any other symptoms? No

## 2020-04-22 NOTE — Telephone Encounter (Signed)
Spoke to patient she stated she started having palpitations yesterday off and on.No chest pain.No sob.B/P 157/79 pulse 79.Advised to keep appointment already scheduled with Dr.Schumann 2/18 at 9:00 am.

## 2020-04-23 ENCOUNTER — Encounter: Payer: Self-pay | Admitting: Cardiology

## 2020-04-23 ENCOUNTER — Ambulatory Visit: Payer: 59 | Admitting: Cardiology

## 2020-04-23 ENCOUNTER — Ambulatory Visit (INDEPENDENT_AMBULATORY_CARE_PROVIDER_SITE_OTHER): Payer: 59

## 2020-04-23 ENCOUNTER — Other Ambulatory Visit: Payer: Self-pay

## 2020-04-23 ENCOUNTER — Encounter: Payer: Self-pay | Admitting: Radiology

## 2020-04-23 VITALS — BP 124/68 | HR 79 | Ht 65.0 in | Wt 195.0 lb

## 2020-04-23 DIAGNOSIS — R002 Palpitations: Secondary | ICD-10-CM

## 2020-04-23 DIAGNOSIS — E782 Mixed hyperlipidemia: Secondary | ICD-10-CM | POA: Diagnosis not present

## 2020-04-23 DIAGNOSIS — I350 Nonrheumatic aortic (valve) stenosis: Secondary | ICD-10-CM | POA: Diagnosis not present

## 2020-04-23 DIAGNOSIS — I351 Nonrheumatic aortic (valve) insufficiency: Secondary | ICD-10-CM

## 2020-04-23 NOTE — Progress Notes (Signed)
Enrolled patient for a 3 day Zio XT to be mailed to patients home.

## 2020-04-23 NOTE — Patient Instructions (Signed)
Medication Instructions:  Your physician recommends that you continue on your current medications as directed. Please refer to the Current Medication list given to you today.  *If you need a refill on your cardiac medications before your next appointment, please call your pharmacy*  Testing/Procedures: Echocardiogram as scheduled  ZIO XT- Long Term Monitor Instructions   Your physician has requested you wear your ZIO patch monitor 3 days.   This is a single patch monitor.  [employer] supplies one patch monitor per enrollment.  Additional stickers are not available.   Please do not apply patch if you will be having a Nuclear Stress Test, Echocardiogram, Cardiac CT, MRI, or Chest Xray during the time frame you would be wearing the monitor. The patch cannot be worn during these tests.  You cannot remove and re-apply the ZIO XT patch monitor.   Your ZIO patch monitor will be sent USPS Priority mail from [facility] directly to your home address. The monitor may also be mailed to a PO BOX if home delivery is not available.   It may take 3-5 days to receive your monitor after you have been enrolled.   Once you have received you monitor, please review enclosed instructions.  Your monitor has already been registered assigning a specific monitor serial # to you.   Applying the monitor   Shave hair from upper left chest.   Hold abrader disc by orange tab.  Rub abrader in 40 strokes over left upper chest as indicated in your monitor instructions.   Clean area with 4 enclosed alcohol pads .  Use all pads to assure are is cleaned thoroughly.  Let dry.   Apply patch as indicated in monitor instructions.  Patch will be place under collarbone on left side of chest with arrow pointing upward.   Rub patch adhesive wings for 2 minutes.Remove white label marked "1".  Remove white label marked "2".  Rub patch adhesive wings for 2 additional minutes.   While looking in a mirror, press and release  button in center of patch.  A small green light will flash 3-4 times .  This will be your only indicator the monitor has been turned on.     Do not shower for the first 24 hours.  You may shower after the first 24 hours.   Press button if you feel a symptom. You will hear a small click.  Record Date, Time and Symptom in the Patient Log Book.   When you are ready to remove patch, follow instructions on last 2 pages of Patient Log Book.  Stick patch monitor onto last page of Patient Log Book.   Place Patient Log Book in [place] box.  Use locking tab on box and tape box closed securely.  The [employer] has [employer] on it.  Please place in mailbox as soon as possible.  Your physician should have your test results approximately 7 days after the monitor has been mailed back to [facility].   Call [place] at [phone] if you have questions regarding your ZIO XT patch monitor.  Call them immediately if you see an orange light blinking on your monitor.   If your monitor falls off in less than 4 days contact our Monitor department at [phone].  If your monitor becomes loose or falls off after 4 days call [employer] at [phone] for suggestions on securing your monitor.     Follow-Up: At [facility], you and your health needs are our  priority.  As part of our continuing mission to provide you with exceptional heart care, we have created designated Provider Care Teams.  These Care Teams include your primary Cardiologist (physician) and Advanced Practice Providers (APPs -  Physician Assistants and Nurse Practitioners) who all work together to provide you with the care you need, when you need it.  We recommend signing up for the patient portal called "MyChart".  Sign up information is provided on this After Visit Summary.  MyChart is used to connect with patients for Virtual Visits (Telemedicine).  Patients are able to view lab/test results, encounter notes,  upcoming appointments, etc.  Non-urgent messages can be sent to your provider as well.   To learn more about what you can do with MyChart, go to [email or web address].    Your next appointment:   3 month(s)  The format for your next appointment:   In Person  Provider:   Oswaldo Milian, MD

## 2020-04-24 ENCOUNTER — Other Ambulatory Visit: Payer: Self-pay | Admitting: Nurse Practitioner

## 2020-04-24 DIAGNOSIS — F411 Generalized anxiety disorder: Secondary | ICD-10-CM

## 2020-04-28 ENCOUNTER — Other Ambulatory Visit: Payer: Self-pay

## 2020-04-28 ENCOUNTER — Ambulatory Visit
Admission: RE | Admit: 2020-04-28 | Discharge: 2020-04-28 | Disposition: A | Discharge status: Home / Self Care | Payer: 59 | Source: Ambulatory Visit | Attending: Nurse Practitioner | Admitting: Nurse Practitioner

## 2020-04-28 DIAGNOSIS — R002 Palpitations: Secondary | ICD-10-CM

## 2020-04-28 DIAGNOSIS — Z1231 Encounter for screening mammogram for malignant neoplasm of breast: Secondary | ICD-10-CM

## 2020-05-03 ENCOUNTER — Other Ambulatory Visit: Payer: Self-pay | Admitting: Nurse Practitioner

## 2020-05-03 DIAGNOSIS — R928 Other abnormal and inconclusive findings on diagnostic imaging of breast: Secondary | ICD-10-CM

## 2020-05-18 ENCOUNTER — Ambulatory Visit (HOSPITAL_COMMUNITY): Payer: 59 | Attending: Cardiovascular Disease

## 2020-05-18 ENCOUNTER — Ambulatory Visit: Payer: 59

## 2020-05-18 ENCOUNTER — Ambulatory Visit
Admission: RE | Admit: 2020-05-18 | Discharge: 2020-05-18 | Disposition: A | Discharge status: Home / Self Care | Payer: 59 | Source: Ambulatory Visit | Attending: Nurse Practitioner | Admitting: Nurse Practitioner

## 2020-05-18 ENCOUNTER — Other Ambulatory Visit: Payer: Self-pay

## 2020-05-18 DIAGNOSIS — I350 Nonrheumatic aortic (valve) stenosis: Secondary | ICD-10-CM

## 2020-05-18 DIAGNOSIS — R928 Other abnormal and inconclusive findings on diagnostic imaging of breast: Secondary | ICD-10-CM

## 2020-05-18 LAB — ECHOCARDIOGRAM COMPLETE
AR max vel: 1.19 cm2
AV Area VTI: 1.17 cm2
AV Area mean vel: 1.42 cm2
AV Mean grad: 10 mmHg
AV Peak grad: 16.3 mmHg
Ao pk vel: 2.02 m/s
Area-P 1/2: 4.01 cm2
S' Lateral: 3 cm

## 2020-05-27 ENCOUNTER — Other Ambulatory Visit: Payer: Self-pay | Admitting: Nurse Practitioner

## 2020-05-27 DIAGNOSIS — F411 Generalized anxiety disorder: Secondary | ICD-10-CM

## 2020-05-28 ENCOUNTER — Ambulatory Visit: Payer: 59 | Admitting: Cardiology

## 2020-06-11 ENCOUNTER — Telehealth: Payer: Self-pay | Admitting: Cardiology

## 2020-06-11 NOTE — Telephone Encounter (Signed)
Patient called and states that her insurance does not want to cover the Echo she had done 05/18/20 without more information.  [employer] company needs physical exam results that shows that the Echo was necessary. [employer] company also needs a detailed medical history that shows that the study was needed.  The office can reach out to the [place] line at [phone] to discuss the case with a clinical reviewer.

## 2020-06-22 ENCOUNTER — Other Ambulatory Visit: Payer: Self-pay | Admitting: Nurse Practitioner

## 2020-06-22 DIAGNOSIS — F411 Generalized anxiety disorder: Secondary | ICD-10-CM

## 2020-06-23 ENCOUNTER — Other Ambulatory Visit: Payer: Self-pay

## 2020-06-23 ENCOUNTER — Telehealth: Payer: Self-pay | Admitting: Gastroenterology

## 2020-06-23 MED ORDER — PANTOPRAZOLE SODIUM 40 MG PO TBEC: 40.0000 mg | DELAYED_RELEASE_TABLET | Freq: Four times a day (QID) | ORAL | 3 refills | Status: DC

## 2020-06-23 NOTE — Telephone Encounter (Signed)
Inbound call from patient. States her current insurance will not cover Dexilant 60MG . She states she is switching her insurance in June that will cover but for May she will be out. Had questions if there were samples or to discuss what she can take for the month of May. Cost is around $223 for 30 tablets and generic is $166.  Best contact number [phone]

## 2020-06-23 NOTE — Telephone Encounter (Signed)
May substitute each dose of Dexilant with Protonix 40 mg QID (or other PPI as preferred by formulary). Thanks.

## 2020-06-23 NOTE — Telephone Encounter (Signed)
Left a message to the patient to inform her we have no samples. Dr Tarri Glenn please advise on a short term replacement.

## 2020-06-23 NOTE — Telephone Encounter (Signed)
New Rx has been sent to [facility] pharmacy.

## 2020-06-24 ENCOUNTER — Other Ambulatory Visit: Payer: Self-pay | Admitting: Nurse Practitioner

## 2020-06-24 DIAGNOSIS — F411 Generalized anxiety disorder: Secondary | ICD-10-CM

## 2020-06-24 MED ORDER — LORAZEPAM 1 MG PO TABS: 1.0000 mg | ORAL_TABLET | Freq: Three times a day (TID) | ORAL | 0 refills | Status: DC | PRN

## 2020-06-24 NOTE — Progress Notes (Signed)
Lorazepam refilled

## 2020-07-01 ENCOUNTER — Ambulatory Visit: Payer: 59 | Admitting: Family Medicine

## 2020-07-01 ENCOUNTER — Encounter: Payer: Self-pay | Admitting: Family Medicine

## 2020-07-01 ENCOUNTER — Other Ambulatory Visit: Payer: Self-pay

## 2020-07-01 VITALS — BP 133/81 | HR 77 | Temp 97.8°F | Ht 65.0 in | Wt 197.6 lb

## 2020-07-01 DIAGNOSIS — J302 Other seasonal allergic rhinitis: Secondary | ICD-10-CM

## 2020-07-01 DIAGNOSIS — K146 Glossodynia: Secondary | ICD-10-CM

## 2020-07-01 NOTE — Patient Instructions (Signed)
Add Flonase once daily to help with allergies.

## 2020-07-01 NOTE — Progress Notes (Signed)
Assessment & Plan:  1. Tongue sore Lab work ordered to assess for B12 or folate deficiency to cause patient's tongue complaints. - Anemia Profile B  2. Seasonal allergies Encouraged to use Flonase daily in addition to her Claritin.   Follow up plan: Return if symptoms worsen or fail to improve.  Hendricks Limes, MSN, APRN, FNP-C [facility]  Subjective:   Patient ID: Crystal Wong, female    DOB: Jul 27, 1955, 65 y.o.   MRN: [id number]  HPI: Crystal Wong is a 65 y.o. female presenting on 07/01/2020 for Thrush (X 1 day) and Ear Pain (Bilateral x few weeks on and off)  Patient reports she developed white spots, blisters, and soreness of the tongue yesterday.  States she has a history of thrush that has gotten so bad she has had to go to the emergency room.  She also reports both ears feel full.  She feels this is related to her allergies.  She is taking Claritin daily.  She does not use any kind of nasal spray.   ROS: Negative unless specifically indicated above in HPI.   Relevant past medical history reviewed and updated as indicated.   Allergies and medications reviewed and updated.   Current Outpatient Medications:  .  betamethasone dipropionate 0.05 % lotion, Apply topically 2 (two) times daily., Disp: , Rfl:  .  citalopram (CELEXA) 40 MG tablet, TAKE 1 TABLET BY MOUTH EVERY DAY, Disp: 90 tablet, Rfl: 1 .  doxycycline (VIBRAMYCIN) 50 MG capsule, Take 50 mg by mouth daily., Disp: , Rfl:  .  Evolocumab (REPATHA SURECLICK) 696 MG/ML SOAJ, Inject 140 mg into the skin every 14 (fourteen) days., Disp: 2 mL, Rfl: 11 .  famotidine (PEPCID) 20 MG tablet, Take 1 tablet (20 mg total) by mouth 2 (two) times daily., Disp: 180 tablet, Rfl: 1 .  fluticasone (FLONASE) 50 MCG/ACT nasal spray, Place 2 sprays into both nostrils daily., Disp: 16 g, Rfl: 6 .  hydrocortisone (ANUSOL-HC) 25 MG suppository, PLACE 1 SUPPOSITORY (25 MG TOTAL) RECTALLY 2 (TWO) TIMES DAILY.  (Patient taking differently: Place 25 mg rectally as needed.), Disp: 12 suppository, Rfl: 0 .  LORATADINE-D 24HR 10-240 MG 24 hr tablet, Take 1 tablet by mouth daily, Disp: 90 tablet, Rfl: 0 .  LORazepam (ATIVAN) 1 MG tablet, Take 1 tablet (1 mg total) by mouth every 8 (eight) hours as needed., Disp: 30 tablet, Rfl: 0 .  pantoprazole (PROTONIX) 40 MG tablet, Take 1 tablet (40 mg total) by mouth 4 (four) times daily., Disp: 120 tablet, Rfl: 3 .  rosuvastatin (CRESTOR) 40 MG tablet, Take 1 tablet (40 mg total) by mouth daily., Disp: 90 tablet, Rfl: 1 .  VENTOLIN HFA 108 (90 Base) MCG/ACT inhaler, INHALE 2 PUFFS BY MOUTH EVERY 6 HOURS AS NEEDED FOR WHEEZING, Disp: 18 g, Rfl: 0 .  VITAMIN D, CHOLECALCIFEROL, PO, Take 1 tablet by mouth daily. , Disp: , Rfl:   Allergies  Allergen Reactions  . Sulfa Antibiotics Anaphylaxis  . Sulfamethoxazole-Trimethoprim Swelling  . Elemental Sulfur Other (See Comments)    Other reaction(s): Angioedema (ALLERGY/intolerance) Other reaction(s): Angioedema (ALLERGY/intolerance)    Objective:   BP 133/81   Pulse 77   Temp 97.8 F (36.6 C) (Temporal)   Ht 5\' 5"  (1.651 m)   Wt 197 lb 9.6 oz (89.6 kg)   SpO2 97%   BMI 32.88 kg/m    Physical Exam Vitals reviewed.  Constitutional:      General: She is not in  acute distress.    Appearance: Normal appearance. She is not ill-appearing, toxic-appearing or diaphoretic.  HENT:     Head: Normocephalic and atraumatic.     Right Ear: Tympanic membrane, ear canal and external ear normal. There is no impacted cerumen.     Left Ear: Tympanic membrane, ear canal and external ear normal. There is no impacted cerumen.     Mouth/Throat:     Mouth: Mucous membranes are moist.     Pharynx: Oropharynx is clear. No oropharyngeal exudate or posterior oropharyngeal erythema.     Comments: No blisters or white patches in patients mouth. Her tongue does have some minor cracks in it.  Eyes:     General: No scleral icterus.        Right eye: No discharge.        Left eye: No discharge.     Conjunctiva/sclera: Conjunctivae normal.  Cardiovascular:     Rate and Rhythm: Normal rate.  Pulmonary:     Effort: Pulmonary effort is normal. No respiratory distress.  Musculoskeletal:        General: Normal range of motion.     Cervical back: Normal range of motion.  Skin:    General: Skin is warm and dry.     Capillary Refill: Capillary refill takes less than 2 seconds.  Neurological:     General: No focal deficit present.     Mental Status: She is alert and oriented to person, place, and time. Mental status is at baseline.  Psychiatric:        Mood and Affect: Mood normal.        Behavior: Behavior normal.        Thought Content: Thought content normal.        Judgment: Judgment normal.

## 2020-07-02 LAB — ANEMIA PROFILE B
Basophils Absolute: 0.1 10*3/uL (ref 0.0–0.2)
Basos: 1 %
EOS (ABSOLUTE): 0.2 10*3/uL (ref 0.0–0.4)
Eos: 3 %
Ferritin: 62 ng/mL (ref 15–150)
Folate: 8.3 ng/mL (ref >3.0)
Hematocrit: 41.5 % (ref 34.0–46.6)
Hemoglobin: 13.9 g/dL (ref 11.1–15.9)
Immature Grans (Abs): 0 10*3/uL (ref 0.0–0.1)
Immature Granulocytes: 0 %
Iron Saturation: 17 % (ref 15–55)
Iron: 64 ug/dL (ref 27–139)
Lymphocytes Absolute: 1.8 10*3/uL (ref 0.7–3.1)
Lymphs: 26 %
MCH: 29 pg (ref 26.6–33.0)
MCHC: 33.5 g/dL (ref 31.5–35.7)
MCV: 87 fL (ref 79–97)
Monocytes Absolute: 0.5 10*3/uL (ref 0.1–0.9)
Monocytes: 7 %
Neutrophils Absolute: 4.3 10*3/uL (ref 1.4–7.0)
Neutrophils: 63 %
Platelets: 236 10*3/uL (ref 150–450)
RBC: 4.8 x10E6/uL (ref 3.77–5.28)
RDW: 13.5 % (ref 11.7–15.4)
Retic Ct Pct: 1.5 % (ref 0.6–2.6)
Total Iron Binding Capacity: 373 ug/dL (ref 250–450)
UIBC: 309 ug/dL (ref 118–369)
Vitamin B-12: 345 pg/mL (ref 232–1245)
WBC: 6.8 10*3/uL (ref 3.4–10.8)

## 2020-07-03 ENCOUNTER — Other Ambulatory Visit: Payer: Self-pay | Admitting: Family Medicine

## 2020-07-03 MED ORDER — NYSTATIN 100000 UNIT/ML MT SUSP: 5.0000 mL | Freq: Four times a day (QID) | OROMUCOSAL | 0 refills | Status: DC

## 2020-07-15 ENCOUNTER — Encounter: Payer: Self-pay | Admitting: Physician Assistant

## 2020-07-15 ENCOUNTER — Telehealth: Payer: Self-pay | Admitting: Physician Assistant

## 2020-07-15 DIAGNOSIS — B379 Candidiasis, unspecified: Secondary | ICD-10-CM

## 2020-07-15 DIAGNOSIS — B9689 Other specified bacterial agents as the cause of diseases classified elsewhere: Secondary | ICD-10-CM

## 2020-07-15 DIAGNOSIS — J329 Chronic sinusitis, unspecified: Secondary | ICD-10-CM

## 2020-07-15 DIAGNOSIS — T3695XA Adverse effect of unspecified systemic antibiotic, initial encounter: Secondary | ICD-10-CM

## 2020-07-15 MED ORDER — FLUCONAZOLE 150 MG PO TABS: 150.0000 mg | ORAL_TABLET | Freq: Once | ORAL | 0 refills | Status: AC

## 2020-07-15 MED ORDER — FLUCONAZOLE 150 MG PO TABS: 150.0000 mg | ORAL_TABLET | Freq: Once | ORAL | 0 refills | Status: DC

## 2020-07-15 MED ORDER — AMOXICILLIN-POT CLAVULANATE 875-125 MG PO TABS: 1.0000 | ORAL_TABLET | Freq: Two times a day (BID) | ORAL | 0 refills | Status: DC

## 2020-07-15 NOTE — Patient Instructions (Signed)
Vaginal Yeast Infection, Adult  Vaginal yeast infection is a condition that causes vaginal discharge as well as soreness, swelling, and redness (inflammation) of the vagina. This is a common condition. Some women get this infection frequently. What are the causes? This condition is caused by a change in the normal balance of the yeast (candida) and bacteria that live in the vagina. This change causes an overgrowth of yeast, which causes the inflammation. What increases the risk? The condition is more likely to develop in women who:  Take antibiotic medicines.  Have diabetes.  Take birth control pills.  Are pregnant.  Douche often.  Have a weak body defense system (immune system).  Have been taking steroid medicines for a long time.  Frequently wear tight clothing. What are the signs or symptoms? Symptoms of this condition include:  White, thick, creamy vaginal discharge.  Swelling, itching, redness, and irritation of the vagina. The lips of the vagina (vulva) may be affected as well.  Pain or a burning feeling while urinating.  Pain during sex. How is this diagnosed? This condition is diagnosed based on:  Your medical history.  A physical exam.  A pelvic exam. Your health care provider will examine a sample of your vaginal discharge under a microscope. Your health care provider may send this sample for testing to confirm the diagnosis. How is this treated? This condition is treated with medicine. Medicines may be over-the-counter or prescription. You may be told to use one or more of the following:  Medicine that is taken by mouth (orally).  Medicine that is applied as a cream (topically).  Medicine that is inserted directly into the vagina (suppository). Follow these instructions at home: Lifestyle  Do not have sex until your health care provider approves. Tell your sex partner that you have a yeast infection. That person should go to his or her health care  provider and ask if they should also be treated.  Do not wear tight clothes, such as pantyhose or tight pants.  Wear breathable cotton underwear. General instructions  Take or apply over-the-counter and prescription medicines only as told by your health care provider.  Eat more yogurt. This may help to keep your yeast infection from returning.  Do not use tampons until your health care provider approves.  Try taking a sitz bath to help with discomfort. This is a warm water bath that is taken while you are sitting down. The water should only come up to your hips and should cover your buttocks. Do this 3-4 times per day or as told by your health care provider.  Do not douche.  If you have diabetes, keep your blood sugar levels under control.  Keep all follow-up visits as told by your health care provider. This is important.   Contact a health care provider if:  You have a fever.  Your symptoms go away and then return.  Your symptoms do not get better with treatment.  Your symptoms get worse.  You have new symptoms.  You develop blisters in or around your vagina.  You have blood coming from your vagina and it is not your menstrual period.  You develop pain in your abdomen. Summary  Vaginal yeast infection is a condition that causes discharge as well as soreness, swelling, and redness (inflammation) of the vagina.  This condition is treated with medicine. Medicines may be over-the-counter or prescription.  Take or apply over-the-counter and prescription medicines only as told by your health care provider.  Do not   douche. Do not have sex or use tampons until your health care provider approves.  Contact a health care provider if your symptoms do not get better with treatment or your symptoms go away and then return. This information is not intended to replace advice given to you by your health care provider. Make sure you discuss any questions you have with your health care  provider. Document Revised: 09/20/2018 Document Reviewed: 07/09/2017 Elsevier Patient Education  2021 [place]. Sinusitis, Adult Sinusitis is inflammation of your sinuses. Sinuses are hollow spaces in the bones around your face. Your sinuses are located:  Around your eyes.  In the middle of your forehead.  Behind your nose.  In your cheekbones. Mucus normally drains out of your sinuses. When your nasal tissues become inflamed or swollen, mucus can become trapped or blocked. This allows bacteria, viruses, and fungi to grow, which leads to infection. Most infections of the sinuses are caused by a virus. Sinusitis can develop quickly. It can last for up to 4 weeks (acute) or for more than 12 weeks (chronic). Sinusitis often develops after a cold. What are the causes? This condition is caused by anything that creates swelling in the sinuses or stops mucus from draining. This includes:  Allergies.  Asthma.  Infection from bacteria or viruses.  Deformities or blockages in your nose or sinuses.  Abnormal growths in the nose (nasal polyps).  Pollutants, such as chemicals or irritants in the air.  Infection from fungi (rare). What increases the risk? You are more likely to develop this condition if you:  Have a weak body defense system (immune system).  Do a lot of swimming or diving.  Overuse nasal sprays.  Smoke. What are the signs or symptoms? The main symptoms of this condition are pain and a feeling of pressure around the affected sinuses. Other symptoms include:  Stuffy nose or congestion.  Thick drainage from your nose.  Swelling and warmth over the affected sinuses.  Headache.  Upper toothache.  A cough that may get worse at night.  Extra mucus that collects in the throat or the back of the nose (postnasal drip).  Decreased sense of smell and taste.  Fatigue.  A fever.  Sore throat.  Bad breath. How is this diagnosed? This condition is diagnosed  based on:  Your symptoms.  Your medical history.  A physical exam.  Tests to find out if your condition is acute or chronic. This may include: ? Checking your nose for nasal polyps. ? Viewing your sinuses using a device that has a light (endoscope). ? Testing for allergies or bacteria. ? Imaging tests, such as an MRI or CT scan. In rare cases, a bone biopsy may be done to rule out more serious types of fungal sinus disease. How is this treated? Treatment for sinusitis depends on the cause and whether your condition is chronic or acute.  If caused by a virus, your symptoms should go away on their own within 10 days. You may be given medicines to relieve symptoms. They include: ? Medicines that shrink swollen nasal passages (topical intranasal decongestants). ? Medicines that treat allergies (antihistamines). ? A spray that eases inflammation of the nostrils (topical intranasal corticosteroids). ? Rinses that help get rid of thick mucus in your nose (nasal saline washes).  If caused by bacteria, your health care provider may recommend waiting to see if your symptoms improve. Most bacterial infections will get better without antibiotic medicine. You may be given antibiotics if you have: ?  A severe infection. ? A weak immune system.  If caused by narrow nasal passages or nasal polyps, you may need to have surgery. Follow these instructions at home: Medicines  Take, use, or apply over-the-counter and prescription medicines only as told by your health care provider. These may include nasal sprays.  If you were prescribed an antibiotic medicine, take it as told by your health care provider. Do not stop taking the antibiotic even if you start to feel better. Hydrate and humidify  Drink enough fluid to keep your urine pale yellow. Staying hydrated will help to thin your mucus.  Use a cool mist humidifier to keep the humidity level in your home above 50%.  Inhale steam for 10-15  minutes, 3-4 times a day, or as told by your health care provider. You can do this in the bathroom while a hot shower is running.  Limit your exposure to cool or dry air.   Rest  Rest as much as possible.  Sleep with your head raised (elevated).  Make sure you get enough sleep each night. General instructions  Apply a warm, moist washcloth to your face 3-4 times a day or as told by your health care provider. This will help with discomfort.  Wash your hands often with soap and water to reduce your exposure to germs. If soap and water are not available, use hand sanitizer.  Do not smoke. Avoid being around people who are smoking (secondhand smoke).  Keep all follow-up visits as told by your health care provider. This is important.   Contact a health care provider if:  You have a fever.  Your symptoms get worse.  Your symptoms do not improve within 10 days. Get help right away if:  You have a severe headache.  You have persistent vomiting.  You have severe pain or swelling around your face or eyes.  You have vision problems.  You develop confusion.  Your neck is stiff.  You have trouble breathing. Summary  Sinusitis is soreness and inflammation of your sinuses. Sinuses are hollow spaces in the bones around your face.  This condition is caused by nasal tissues that become inflamed or swollen. The swelling traps or blocks the flow of mucus. This allows bacteria, viruses, and fungi to grow, which leads to infection.  If you were prescribed an antibiotic medicine, take it as told by your health care provider. Do not stop taking the antibiotic even if you start to feel better.  Keep all follow-up visits as told by your health care provider. This is important. This information is not intended to replace advice given to you by your health care provider. Make sure you discuss any questions you have with your health care provider. Document Revised: 07/23/2017 Document Reviewed:  07/23/2017 Elsevier Patient Education  2021 [employer].

## 2020-07-15 NOTE — Progress Notes (Signed)
Ms. Crystal Wong, Crystal Wong are scheduled for a virtual visit with your provider today.    Just as we do with appointments in the office, we must obtain your consent to participate.  Your consent will be active for this visit and any virtual visit you may have with one of our providers in the next 365 days.    If you have a MyChart account, I can also send a copy of this consent to you electronically.  All virtual visits are billed to your insurance company just like a traditional visit in the office.  As this is a virtual visit, video technology does not allow for your provider to perform a traditional examination.  This may limit your provider's ability to fully assess your condition.  If your provider identifies any concerns that need to be evaluated in person or the need to arrange testing such as labs, EKG, etc, we will make arrangements to do so.    Although advances in technology are sophisticated, we cannot ensure that it will always work on either your end or our end.  If the connection with a video visit is poor, we may have to switch to a telephone visit.  With either a video or telephone visit, we are not always able to ensure that we have a secure connection.   I need to obtain your verbal consent now.   Are you willing to proceed with your visit today?   Crystal Wong has provided verbal consent on 07/15/2020 for a virtual visit (video or telephone).   Crystal Daring, PA-C 07/15/2020  12:41 PM    MyChart Video Visit    Virtual Visit via Video Note   This visit type was conducted due to national recommendations for restrictions regarding the COVID-19 Pandemic (e.g. social distancing) in an effort to limit this patient's exposure and mitigate transmission in our community. This patient is at least at moderate risk for complications without adequate follow up. This format is felt to be most appropriate for this patient at this time. Physical exam was limited by quality of the video and audio  technology used for the visit.   Patient location: Home Provider location: Home office in [place]  I discussed the limitations of evaluation and management by telemedicine and the availability of in person appointments. The patient expressed understanding and agreed to proceed.  Patient: Crystal Wong   DOB: March 22, 1955   65 y.o. Female  MRN: [id number] Visit Date: 07/15/2020  Today's healthcare provider: Mar Daring, PA-C   No chief complaint on file.  Subjective    Sinusitis This is a new problem. The current episode started in the past 7 days. The problem has been gradually worsening since onset. There has been no fever. The pain is mild. Associated symptoms include chills, congestion, coughing, ear pain, headaches, sinus pressure and a sore throat. Pertinent negatives include no shortness of breath or swollen glands. Past treatments include saline sprays, spray decongestants and lying down (Claritin, Flonase). The treatment provided mild relief.  Vaginal Itching The patient's primary symptoms include genital itching and vaginal discharge. This is a new problem. The current episode started in the past 7 days. The problem occurs constantly. The problem has been gradually worsening. The patient is experiencing no pain. She is not pregnant. Associated symptoms include chills, a fever (reports low grade 99.7 today), headaches and a sore throat. Pertinent negatives include no abdominal pain, dysuria, flank pain, frequency, hematuria, nausea, urgency or vomiting. The vaginal discharge was  thick and white. There has been no bleeding. Exacerbated by: on daily doxycycline for skin. She has tried nothing for the symptoms.    Home covid test was negative.   Patient Active Problem List   Diagnosis Date Noted  . Acute bilateral low back pain without sciatica 08/07/2019  . Asthma 04/24/2016  . BMI 33.0-33.9,adult 03/02/2016  . Mixed hyperlipidemia 08/23/2012  . Insomnia 08/23/2012  .  GERD (gastroesophageal reflux disease) 08/23/2012  . Depression 08/23/2012  . GAD (generalized anxiety disorder) 08/23/2012   Past Medical History:  Diagnosis Date  . Anxiety   . Asthma   . Depression   . GERD (gastroesophageal reflux disease)   . Heart murmur   . Hyperlipidemia   . Insomnia       Medications: Outpatient Medications Prior to Visit  Medication Sig  . betamethasone dipropionate 0.05 % lotion Apply topically 2 (two) times daily.  . citalopram (CELEXA) 40 MG tablet TAKE 1 TABLET BY MOUTH EVERY DAY  . doxycycline (VIBRAMYCIN) 50 MG capsule Take 50 mg by mouth daily.  . Evolocumab (REPATHA SURECLICK) 829 MG/ML SOAJ Inject 140 mg into the skin every 14 (fourteen) days.  . famotidine (PEPCID) 20 MG tablet Take 1 tablet (20 mg total) by mouth 2 (two) times daily.  . fluticasone (FLONASE) 50 MCG/ACT nasal spray Place 2 sprays into both nostrils daily.  . hydrocortisone (ANUSOL-HC) 25 MG suppository PLACE 1 SUPPOSITORY (25 MG TOTAL) RECTALLY 2 (TWO) TIMES DAILY. (Patient taking differently: Place 25 mg rectally as needed.)  . LORATADINE-D 24HR 10-240 MG 24 hr tablet Take 1 tablet by mouth daily  . LORazepam (ATIVAN) 1 MG tablet Take 1 tablet (1 mg total) by mouth every 8 (eight) hours as needed.  . nystatin (MYCOSTATIN) 100000 UNIT/ML suspension Take 5 mLs (500,000 Units total) by mouth 4 (four) times daily.  . pantoprazole (PROTONIX) 40 MG tablet Take 1 tablet (40 mg total) by mouth 4 (four) times daily.  . rosuvastatin (CRESTOR) 40 MG tablet Take 1 tablet (40 mg total) by mouth daily.  . VENTOLIN HFA 108 (90 Base) MCG/ACT inhaler INHALE 2 PUFFS BY MOUTH EVERY 6 HOURS AS NEEDED FOR WHEEZING  . VITAMIN D, CHOLECALCIFEROL, PO Take 1 tablet by mouth daily.    No facility-administered medications prior to visit.    Review of Systems  Constitutional: Positive for chills and fever (reports low grade 99.7 today).  HENT: Positive for congestion, ear pain, postnasal drip,  rhinorrhea, sinus pressure, sinus pain and sore throat.   Respiratory: Positive for cough. Negative for shortness of breath.   Cardiovascular: Negative.   Gastrointestinal: Negative for abdominal pain, nausea and vomiting.  Genitourinary: Positive for vaginal discharge. Negative for dysuria, flank pain, frequency, hematuria and urgency.  Neurological: Positive for headaches.    Last CBC Lab Results  Component Value Date   WBC 6.8 07/01/2020   HGB 13.9 07/01/2020   HCT 41.5 07/01/2020   MCV 87 07/01/2020   MCH 29.0 07/01/2020   RDW 13.5 07/01/2020   PLT 236 56/21/3086   Last metabolic panel Lab Results  Component Value Date   GLUCOSE 120 (H) 03/02/2020   NA 139 03/02/2020   K 4.7 03/02/2020   CL 101 03/02/2020   CO2 25 03/02/2020   BUN 18 03/02/2020   CREATININE 0.87 03/02/2020   GFRNONAA 72 03/02/2020   GFRAA 83 03/02/2020   CALCIUM 10.1 03/02/2020   PROT 6.9 03/02/2020   ALBUMIN 4.6 03/02/2020   LABGLOB 2.3 03/02/2020   AGRATIO  2.0 03/02/2020   BILITOT 0.4 03/02/2020   ALKPHOS 94 03/02/2020   AST 19 03/02/2020   ALT 16 03/02/2020      Objective    There were no vitals taken for this visit. BP Readings from Last 3 Encounters:  07/01/20 133/81  04/23/20 124/68  03/02/20 130/71   Wt Readings from Last 3 Encounters:  07/01/20 197 lb 9.6 oz (89.6 kg)  04/23/20 195 lb (88.5 kg)  03/02/20 196 lb (88.9 kg)      Physical Exam Vitals reviewed.  Constitutional:      General: She is not in acute distress.    Appearance: Normal appearance. She is well-developed. She is not ill-appearing.  HENT:     Head: Normocephalic and atraumatic.  Pulmonary:     Effort: Pulmonary effort is normal. No respiratory distress.  Musculoskeletal:     Cervical back: Normal range of motion and neck supple.  Neurological:     Mental Status: She is alert.  Psychiatric:        Mood and Affect: Mood normal.        Behavior: Behavior normal.        Thought Content: Thought content  normal.        Judgment: Judgment normal.        Assessment & Plan     1. Bacterial sinusitis - Worsening symptoms that have not responded to OTC medications.  - Will give augmentin as below.  - Continue allergy medications.  - Stay well hydrated and get plenty of rest.  - Seek in-person evaluation if no symptom improvement or if symptoms worsen. - amoxicillin-clavulanate (AUGMENTIN) 875-125 MG tablet; Take 1 tablet by mouth 2 (two) times daily.  Dispense: 20 tablet; Refill: 0  2. Antibiotic-induced yeast infection Gets yeast infections with antibiotics. Diflucan given as below.  - fluconazole (DIFLUCAN) 150 MG tablet; Take 1 tablet (150 mg total) by mouth once for 1 dose.  Repeat 1 tablet every 3 days. Dispense: 3 tablet; Refill: 0   No follow-ups on file.     I discussed the assessment and treatment plan with the patient. The patient was provided an opportunity to ask questions and all were answered. The patient agreed with the plan and demonstrated an understanding of the instructions.   The patient was advised to call back or seek an in-person evaluation if the symptoms worsen or if the condition fails to improve as anticipated.  I provided 12 minutes of face-to-face time during this encounter via MyChart Video enabled encounter.   [place] [phone] (phone) [phone] (fax)  [place]

## 2020-07-21 ENCOUNTER — Other Ambulatory Visit: Payer: Self-pay

## 2020-07-21 ENCOUNTER — Encounter: Payer: Self-pay | Admitting: Cardiology

## 2020-07-21 ENCOUNTER — Ambulatory Visit (INDEPENDENT_AMBULATORY_CARE_PROVIDER_SITE_OTHER): Payer: Self-pay | Admitting: Cardiology

## 2020-07-21 VITALS — BP 126/76 | HR 74 | Ht 66.0 in | Wt 198.0 lb

## 2020-07-21 DIAGNOSIS — E782 Mixed hyperlipidemia: Secondary | ICD-10-CM

## 2020-07-21 DIAGNOSIS — I350 Nonrheumatic aortic (valve) stenosis: Secondary | ICD-10-CM

## 2020-07-21 DIAGNOSIS — I351 Nonrheumatic aortic (valve) insufficiency: Secondary | ICD-10-CM

## 2020-07-21 DIAGNOSIS — R002 Palpitations: Secondary | ICD-10-CM

## 2020-07-21 NOTE — Progress Notes (Signed)
Cardiology Office Note:    Date:  07/21/2020   ID:  Crystal Wong, DOB Mar 01, 1956, MRN [id number]  PCP:  Chevis Pretty, FNP  Cardiologist:  Donato Heinz, MD  Electrophysiologist:  None   Referring MD: Chevis Pretty, *   Chief Complaint  Patient presents with  . Palpitations    History of Present Illness:    Crystal Wong is a 65 y.o. female with a hx of anxiety, depression, asthma, GERD, hyperlipidemia who presents for follow-up.  She was referred by Dr. Tarri Glenn for pre-op evaluation, initially seen on 05/19/2019.  She was referred to gastroenterology for EGD, requested cardiac evaluation prior to procedure.  She reports that she has been having a fluttering feeling in her chest that started in January.  Was occurring every day, but has not had for the last week.  Can last for 20 minutes up to hours.  In addition to chest fluttering, she sometimes avoids episodes where she feels like her heart is racing, but states that this is related to caffeine use.  Has also been having burning in her chest that she has attributed to GERD.  Has improved with increasing her GERD medications.  Reports that she exercises regularly, walks 4 miles per day and does Pilates for 30 minutes.  Denies any exertional chest pain.  Does report some dyspnea with walking up hills.  Never smoked.  Mother had MI at 30.   Reports she was told years ago she had mitral valve prolapse after she was evaluated for heart murmur  At initial clinic visit on 05/19/2019, no further work-up was recommended prior to her EGD, which hse underwent on 06/09/2019.  TTE on 06/03/2019 showed LVEF 60 to 65%, normal RV function, mild aortic stenosis, mild to moderate aortic regurgitation.  Zio patch x14 days on 07/17/2019 showed no significant arrhythmias.  Calcium score on 06/03/2019 was 0.  LDL 205 on 08/19/2019 despite being on rosuvastatin 40 mg daily.  Referred to lipid clinic, started on [place].  Repeat lipid panel on  12/24/2019 showed marked improvement in LDL to 75.  Reported worsening palpitations and in Zio patch x3 days on 05/06/2020 showed no significant arrhythmias.  Echocardiogram on 05/18/2020 showed normal biventricular function, mild AS, mild to moderate AI.  Today, she is doing well. She states that since her last visit she has only experienced palpitations once. She reports that she only experiences palpitations when she is experiencing stomach or GERD issues. She states that she has been exercising with her husband everyday. They walk in their neighborhood for forty minutes. She denies any chest pain, lightheadedness, syncope, or lower extremity edema.    Past Medical History:  Diagnosis Date  . Anxiety   . Asthma   . Depression   . GERD (gastroesophageal reflux disease)   . Heart murmur   . Hyperlipidemia   . Insomnia     Past Surgical History:  Procedure Laterality Date  . ANKLE FRACTURE SURGERY Left    Pins and screws  . BREAST LUMPECTOMY Bilateral    left x 1, right x 2  . CARPAL TUNNEL RELEASE Right   . CARPAL TUNNEL RELEASE Left   . COLONOSCOPY    . KNEE ARTHROSCOPY Bilateral   . PARTIAL HYSTERECTOMY      Current Medications: Current Meds  Medication Sig  . betamethasone dipropionate 0.05 % lotion Apply topically 2 (two) times daily.  . citalopram (CELEXA) 40 MG tablet TAKE 1 TABLET BY MOUTH EVERY DAY  . doxycycline (  VIBRAMYCIN) 50 MG capsule Take 50 mg by mouth daily.  . Evolocumab (REPATHA SURECLICK) XX123456 MG/ML SOAJ Inject 140 mg into the skin every 14 (fourteen) days.  . famotidine (PEPCID) 20 MG tablet Take 1 tablet (20 mg total) by mouth 2 (two) times daily.  . fluconazole (DIFLUCAN) 150 MG tablet Take 150 mg by mouth every 3 (three) days.  . fluticasone (FLONASE) 50 MCG/ACT nasal spray Place 2 sprays into both nostrils daily.  . hydrocortisone (ANUSOL-HC) 25 MG suppository PLACE 1 SUPPOSITORY (25 MG TOTAL) RECTALLY 2 (TWO) TIMES DAILY. (Patient taking differently: Place  25 mg rectally as needed.)  . LORATADINE-D 24HR 10-240 MG 24 hr tablet Take 1 tablet by mouth daily  . LORazepam (ATIVAN) 1 MG tablet Take 1 tablet (1 mg total) by mouth every 8 (eight) hours as needed.  . nystatin (MYCOSTATIN) 100000 UNIT/ML suspension Take 5 mLs (500,000 Units total) by mouth 4 (four) times daily.  . pantoprazole (PROTONIX) 40 MG tablet Take 1 tablet (40 mg total) by mouth 4 (four) times daily.  . rosuvastatin (CRESTOR) 40 MG tablet Take 1 tablet (40 mg total) by mouth daily.  . VENTOLIN HFA 108 (90 Base) MCG/ACT inhaler INHALE 2 PUFFS BY MOUTH EVERY 6 HOURS AS NEEDED FOR WHEEZING  . VITAMIN D, CHOLECALCIFEROL, PO Take 1 tablet by mouth daily.   . [DISCONTINUED] amoxicillin-clavulanate (AUGMENTIN) 875-125 MG tablet Take 1 tablet by mouth 2 (two) times daily.     Allergies:   Sulfa antibiotics, Sulfamethoxazole-trimethoprim, and Elemental sulfur   Social History   Socioeconomic History  . Marital status: Married    Spouse name: Not on file  . Number of children: 2  . Years of education: Not on file  . Highest education level: Not on file  Occupational History  . Occupation: retired  Tobacco Use  . Smoking status: Never Smoker  . Smokeless tobacco: Never Used  Vaping Use  . Vaping Use: Never used  Substance and Sexual Activity  . Alcohol use: Not Currently  . Drug use: No  . Sexual activity: Not on file  Other Topics Concern  . Not on file  Social History Narrative  . Not on file   Social Determinants of Health   Financial Resource Strain: Not on file  Food Insecurity: Not on file  Transportation Needs: Not on file  Physical Activity: Not on file  Stress: Not on file  Social Connections: Not on file     Family History: The patient's family history includes Colon cancer (age of onset: 66) in her father; Colon polyps in her mother; Diabetes in her mother; Gallbladder disease in her daughter; Gallstones in her paternal grandmother; Heart disease in her  mother. There is no history of Esophageal cancer, Stomach cancer, or Rectal cancer.  ROS:   Please see the history of present illness.     All other systems reviewed and are negative.  EKGs/Labs/Other Studies Reviewed:    The following studies were reviewed today:   EKG:   07/21/2020- No ekg was ordered today. 04/23/2020- The ekg ordered  demonstrates normal sinus rhythm, rate 79, RBBB, no ST/T wave abnormalities  Recent Labs: 03/02/2020: ALT 16; BUN 18; Creatinine, Ser 0.87; Potassium 4.7; Sodium 139; TSH 1.250 07/01/2020: Hemoglobin 13.9; Platelets 236  Recent Lipid Panel    Component Value Date/Time   CHOL 167 03/02/2020 1030   CHOL 225 (H) 08/23/2012 1155   TRIG 123 03/02/2020 1030   TRIG 125 12/23/2013 1004   TRIG 51 08/23/2012 1155  HDL 71 03/02/2020 1030   HDL 58 12/23/2013 1004   HDL 59 08/23/2012 1155   CHOLHDL 2.4 03/02/2020 1030   LDLCALC 75 03/02/2020 1030   LDLCALC 163 (H) 12/23/2013 1004   LDLCALC 156 (H) 08/23/2012 1155    Physical Exam:    VS:  BP 126/76 (BP Location: Left Arm, Patient Position: Sitting, Cuff Size: Normal)   Pulse 74   Ht 5\' 6"  (1.676 m)   Wt 198 lb (89.8 kg)   BMI 31.96 kg/m     Wt Readings from Last 3 Encounters:  07/21/20 198 lb (89.8 kg)  07/01/20 197 lb 9.6 oz (89.6 kg)  04/23/20 195 lb (88.5 kg)     GEN:  Well nourished, well developed in no acute distress HEENT: Normal NECK: No JVD; No carotid bruits LYMPHATICS: No lymphadenopathy CARDIAC: RRR, 2/6 systolic heart murmur RESPIRATORY:  Clear to auscultation without rales, wheezing or rhonchi  ABDOMEN: Soft, non-tender, non-distended MUSCULOSKELETAL:  No edema; No deformity  SKIN: Warm and dry NEUROLOGIC:  Alert and oriented x 3 PSYCHIATRIC:  Normal affect   ASSESSMENT:    1. Palpitations   2. Aortic valve insufficiency, etiology of cardiac valve disease unspecified   3. Mild aortic stenosis   4. Mixed hyperlipidemia    PLAN:    Palpitations: Zio patch x14 days  on 07/17/2019 showed no significant arrhythmias.  At clinic visit 04/23/2020, reports now having frequent palpitations, different in character than palpitation she was having before.  Zio patch x3 days on 05/06/2020 showed no significant arrhythmias.  Echocardiogram on 05/18/2020 showed normal biventricular function, mild AS, mild to moderate AI.  Aortic stenosis/regurgitation:  TTE on 06/03/2019 showed LVEF 60 to 65%, normal RV function, mild aortic stenosis, mild to moderate aortic regurgitation.  Echocardiogram on 05/18/2020 showed normal biventricular function, mild AS, mild to moderate AI. Will monitor with repeat echo in 2 years  Hyperlipidemia: LDL 205 on 08/19/2019 despite being on rosuvastatin 40 mg daily.  Referred to lipid clinic, started on [place].  Repeat lipid panel on 12/24/2019 showed marked improvement in LDL to 75.  Calcium score 0 on 06/03/2019.  RTC in 1 year    Medication Adjustments/Labs and Tests Ordered: Current medicines are reviewed at length with the patient today.  Concerns regarding medicines are outlined above.  No orders of the defined types were placed in this encounter.  No orders of the defined types were placed in this encounter.   Patient Instructions  Medication Instructions:  Your physician recommends that you continue on your current medications as directed. Please refer to the Current Medication list given to you today.  *If you need a refill on your cardiac medications before your next appointment, please call your pharmacy*  Follow-Up: At [facility], you and your health needs are our priority.  As part of our continuing mission to provide you with exceptional heart care, we have created designated Provider Care Teams.  These Care Teams include your primary Cardiologist (physician) and Advanced Practice Providers (APPs -  Physician Assistants and Nurse Practitioners) who all work together to provide you with the care you need, when you need it.  We  recommend signing up for the patient portal called "MyChart".  Sign up information is provided on this After Visit Summary.  MyChart is used to connect with patients for Virtual Visits (Telemedicine).  Patients are able to view lab/test results, encounter notes, upcoming appointments, etc.  Non-urgent messages can be sent to your provider as well.   To  learn more about what you can do with MyChart, go to [email or web address].    Your next appointment:   12 month(s)  The format for your next appointment:   In Person  Provider:   Oswaldo Milian, MD        Ella Bodo as a scribe for Donato Heinz, MD.,have documented all relevant documentation on the behalf of Donato Heinz, MD,as directed by  Donato Heinz, MD while in the presence of Donato Heinz, MD.  I, Donato Heinz, MD, have reviewed all documentation for this visit. The documentation on 07/21/20 for the exam, diagnosis, procedures, and orders are all accurate and complete.   Signed, Donato Heinz, MD  07/21/2020 10:04 AM    [place]

## 2020-07-21 NOTE — Patient Instructions (Signed)

## 2020-07-21 NOTE — Progress Notes (Deleted)
Cardiology Office Note:    Date:  07/21/2020   ID:  Crystal Wong, DOB 1955/03/17, MRN [id number]  PCP:  Crystal Pretty, FNP  Cardiologist:  Donato Heinz, MD  Electrophysiologist:  None   Referring MD: Crystal Wong, Mary-Margaret, *   No chief complaint on file.   History of Present Illness:    Crystal Wong is a 65 y.o. female with a hx of anxiety, depression, asthma, GERD, hyperlipidemia who presents for follow-up.  She was referred by Dr. Tarri Wong for pre-op evaluation, initially seen on 05/19/2019.  She was referred to gastroenterology for EGD, requested cardiac evaluation prior to procedure.  She reports that she has been having a fluttering feeling in her chest that started in January.  Was occurring every day, but has not had for the last week.  Can last for 20 minutes up to hours.  In addition to chest fluttering, she sometimes avoids episodes where she feels like her heart is racing, but states that this is related to caffeine use.  Has also been having burning in her chest that she has attributed to GERD.  Has improved with increasing her GERD medications.  Reports that she exercises regularly, walks 4 miles per day and does Pilates for 30 minutes.  Denies any exertional chest pain.  Does report some dyspnea with walking up hills.  Never smoked.  Mother had MI at 40.   Reports she was told years ago she had mitral valve prolapse after she was evaluated for heart murmur  At initial clinic visit on 05/19/2019, no further work-up was recommended prior to her EGD, which hse underwent on 06/09/2019.  TTE on 06/03/2019 showed LVEF 60 to 65%, normal RV function, mild aortic stenosis, mild to moderate aortic regurgitation.  Zio patch x14 days on 07/17/2019 showed no significant arrhythmias.  Calcium score on 06/03/2019 was 0.  LDL 205 on 08/19/2019 despite being on rosuvastatin 40 mg daily.  Referred to lipid clinic, started on [place].  Repeat lipid panel on 12/24/2019 showed marked  improvement in LDL to 75.  Reported worsening palpitations and in Zio patch x3 days on 05/06/2020 showed no significant arrhythmias.  Echocardiogram on 05/18/2020 showed normal biventricular function, mild AS, mild to moderate AI.  Since last clinic visit,  she reports that she has been under a lot of stress as her husband was hospitalized with atrial fibrillation. She reports that she has been having palpitations frequently. Yesterday had 10 episodes, palpitations occurred off and on up to about a minute. Reports this feels different than the palpitations she was having last year for which he wore a monitor and there were no significant arrhythmias. She denies any chest pain, dyspnea, lightheadedness, syncope, or lower extremity edema.   Past Medical History:  Diagnosis Date  . Anxiety   . Asthma   . Depression   . GERD (gastroesophageal reflux disease)   . Heart murmur   . Hyperlipidemia   . Insomnia     Past Surgical History:  Procedure Laterality Date  . ANKLE FRACTURE SURGERY Left    Pins and screws  . BREAST LUMPECTOMY Bilateral    left x 1, right x 2  . CARPAL TUNNEL RELEASE Right   . CARPAL TUNNEL RELEASE Left   . COLONOSCOPY    . KNEE ARTHROSCOPY Bilateral   . PARTIAL HYSTERECTOMY      Current Medications: No outpatient medications have been marked as taking for the 07/21/20 encounter (Appointment) with Donato Heinz, MD.  Allergies:   Sulfa antibiotics, Sulfamethoxazole-trimethoprim, and Elemental sulfur   Social History   Socioeconomic History  . Marital status: Married    Spouse name: Not on file  . Number of children: 2  . Years of education: Not on file  . Highest education level: Not on file  Occupational History  . Occupation: retired  Tobacco Use  . Smoking status: Never Smoker  . Smokeless tobacco: Never Used  Vaping Use  . Vaping Use: Never used  Substance and Sexual Activity  . Alcohol use: Not Currently  . Drug use: No  . Sexual  activity: Not on file  Other Topics Concern  . Not on file  Social History Narrative  . Not on file   Social Determinants of Health   Financial Resource Strain: Not on file  Food Insecurity: Not on file  Transportation Needs: Not on file  Physical Activity: Not on file  Stress: Not on file  Social Connections: Not on file     Family History: The patient's family history includes Colon cancer (age of onset: 5) in her father; Colon polyps in her mother; Diabetes in her mother; Gallbladder disease in her daughter; Gallstones in her paternal grandmother; Heart disease in her mother. There is no history of Esophageal cancer, Stomach cancer, or Rectal cancer.  ROS:   Please see the history of present illness.     All other systems reviewed and are negative.  EKGs/Labs/Other Studies Reviewed:    The following studies were reviewed today:   EKG:  EKG is ordered today.  The ekg ordered  demonstrates normal sinus rhythm, rate 79, RBBB, no ST/T wave abnormalities  Recent Labs: 03/02/2020: ALT 16; BUN 18; Creatinine, Ser 0.87; Potassium 4.7; Sodium 139; TSH 1.250 07/01/2020: Hemoglobin 13.9; Platelets 236  Recent Lipid Panel    Component Value Date/Time   CHOL 167 03/02/2020 1030   CHOL 225 (H) 08/23/2012 1155   TRIG 123 03/02/2020 1030   TRIG 125 12/23/2013 1004   TRIG 51 08/23/2012 1155   HDL 71 03/02/2020 1030   HDL 58 12/23/2013 1004   HDL 59 08/23/2012 1155   CHOLHDL 2.4 03/02/2020 1030   LDLCALC 75 03/02/2020 1030   LDLCALC 163 (H) 12/23/2013 1004   LDLCALC 156 (H) 08/23/2012 1155    Physical Exam:    VS:  There were no vitals taken for this visit.    Wt Readings from Last 3 Encounters:  07/01/20 197 lb 9.6 oz (89.6 kg)  04/23/20 195 lb (88.5 kg)  03/02/20 196 lb (88.9 kg)     GEN:  Well nourished, well developed in no acute distress HEENT: Normal NECK: No JVD; No carotid bruits LYMPHATICS: No lymphadenopathy CARDIAC: RRR, 2/6 systolic heart  murmur RESPIRATORY:  Clear to auscultation without rales, wheezing or rhonchi  ABDOMEN: Soft, non-tender, non-distended MUSCULOSKELETAL:  No edema; No deformity  SKIN: Warm and dry NEUROLOGIC:  Alert and oriented x 3 PSYCHIATRIC:  Normal affect   ASSESSMENT:    No diagnosis found. PLAN:    Palpitations: Zio patch x14 days on 07/17/2019 showed no significant arrhythmias.  At clinic visit 04/23/2020, reports now having frequent palpitations, different in character than palpitation she was having before.  Zio patch x3 days on 05/06/2020 showed no significant arrhythmias.  Echocardiogram on 05/18/2020 showed normal biventricular function, mild AS, mild to moderate AI.  Aortic stenosis/regurgitation:  TTE on 06/03/2019 showed LVEF 60 to 65%, normal RV function, mild aortic stenosis, mild to moderate aortic regurgitation.  Echocardiogram on  05/18/2020 showed normal biventricular function, mild AS, mild to moderate AI. Will monitor with repeat echo in 2 years  Hyperlipidemia: LDL 205 on 08/19/2019 despite being on rosuvastatin 40 mg daily.  Referred to lipid clinic, started on [place].  Repeat lipid panel on 12/24/2019 showed marked improvement in LDL to 75.  Calcium score 0 on 06/03/2019.  RTC in***    Medication Adjustments/Labs and Tests Ordered: Current medicines are reviewed at length with the patient today.  Concerns regarding medicines are outlined above.  No orders of the defined types were placed in this encounter.  No orders of the defined types were placed in this encounter.   There are no Patient Instructions on file for this visit.   Signed, Donato Heinz, MD  07/21/2020 6:38 AM    [place]

## 2020-08-04 ENCOUNTER — Other Ambulatory Visit: Payer: Self-pay | Admitting: Cardiology

## 2020-08-04 DIAGNOSIS — E782 Mixed hyperlipidemia: Secondary | ICD-10-CM

## 2020-08-05 ENCOUNTER — Other Ambulatory Visit: Payer: Self-pay | Admitting: Nurse Practitioner

## 2020-08-05 ENCOUNTER — Other Ambulatory Visit: Payer: Self-pay | Admitting: Gastroenterology

## 2020-08-05 DIAGNOSIS — F3342 Major depressive disorder, recurrent, in full remission: Secondary | ICD-10-CM

## 2020-08-06 ENCOUNTER — Other Ambulatory Visit: Payer: Self-pay | Admitting: Cardiology

## 2020-08-06 DIAGNOSIS — E782 Mixed hyperlipidemia: Secondary | ICD-10-CM

## 2020-08-09 ENCOUNTER — Telehealth: Payer: Self-pay | Admitting: Cardiology

## 2020-08-09 NOTE — Telephone Encounter (Signed)
Pt c/o medication issue:  1. Name of Medication: Evolocumab (REPATHA SURECLICK) 922 MG/ML SOAJ  2. How are you currently taking this medication (dosage and times per day)? Inject once every 14 days  3. Are you having a reaction (difficulty breathing--STAT)? no  4. What is your medication issue? Patient states her insurance changed and they will not cover repatha, but will cover a generic.

## 2020-08-09 NOTE — Telephone Encounter (Signed)
Routed to pharmacy team 

## 2020-08-09 NOTE — Telephone Encounter (Signed)
lmomed the pt that we placed a pa for praluent and we will see what the insurance determines

## 2020-08-11 ENCOUNTER — Telehealth: Payer: Self-pay | Admitting: Cardiology

## 2020-08-11 DIAGNOSIS — E782 Mixed hyperlipidemia: Secondary | ICD-10-CM

## 2020-08-11 NOTE — Telephone Encounter (Signed)
Sample of repatha was also placed during this call for the pt to tie her over until the praluent comes back approved and the pt voiced understanding

## 2020-08-11 NOTE — Telephone Encounter (Signed)
Called and spoke w/ pt and stated that Based on your change in insurance this year they will not cover Repatha you have to have tried and failed praluent by being allergic or being intolerant. I am sorry your insurance is making you do that but this is all we can do. I manage over 1400 patients on repatha and praluent for all of [facility] at all locations and this happens all the time but ultimately its based on the insurance the pt voiced understanding.

## 2020-08-11 NOTE — Telephone Encounter (Signed)
pt would like to speak w/ dr. Jacobo Forest nurse, she has prior authorization # for denied rx and would like to provide this and other info to the nurse.

## 2020-08-11 NOTE — Telephone Encounter (Signed)
Spoke to patient she stated she called her insurance company this morning about why they denied Repatha.Stated insurance requires Dr.Schumann to call prior authorization # [id number].Advised I will send message to Dr.Schumann's RN.

## 2020-08-12 MED ORDER — PRALUENT 150 MG/ML ~~LOC~~ SOAJ: 150.0000 mg | SUBCUTANEOUS | 11 refills | Status: DC

## 2020-08-12 NOTE — Addendum Note (Signed)
Addended by: Allean Found on: 08/12/2020 09:28 AM   Modules accepted: Orders

## 2020-08-12 NOTE — Telephone Encounter (Signed)
Called and spoke w/patient regarding an approval of praluent, rx sent, pt instructed to complete fasting labs post 4th dose, got her approved for copay card and emailed her a copy to take to the pharmacy

## 2020-08-23 ENCOUNTER — Telehealth: Payer: Self-pay | Admitting: Physician Assistant

## 2020-08-23 DIAGNOSIS — N760 Acute vaginitis: Secondary | ICD-10-CM

## 2020-08-23 MED ORDER — FLUCONAZOLE 150 MG PO TABS: ORAL_TABLET | ORAL | 0 refills | Status: DC

## 2020-08-23 MED ORDER — NYSTATIN 100000 UNIT/GM EX CREA: 1.0000 "application " | TOPICAL_CREAM | Freq: Two times a day (BID) | CUTANEOUS | 0 refills | Status: AC

## 2020-08-23 NOTE — Progress Notes (Signed)
We are sorry that you are not feeling well. Here is how we plan to help! Based on what you shared with me it looks like you: May have a yeast vaginosis  Vaginosis is an inflammation of the vagina that can result in discharge, itching and pain. The cause is usually a change in the normal balance of vaginal bacteria or an infection. Vaginosis can also result from reduced estrogen levels after menopause.  The most common causes of vaginosis are:   Bacterial vaginosis which results from an overgrowth of one on several organisms that are normally present in your vagina.   Yeast infections which are caused by a naturally occurring fungus called candida.   Vaginal atrophy (atrophic vaginosis) which results from the thinning of the vagina from reduced estrogen levels after menopause.   Trichomoniasis which is caused by a parasite and is commonly transmitted by sexual intercourse.  Factors that increase your risk of developing vaginosis include: Medications, such as antibiotics and steroids Uncontrolled diabetes Use of hygiene products such as bubble bath, vaginal spray or vaginal deodorant Douching Wearing damp or tight-fitting clothing Using an intrauterine device (IUD) for birth control Hormonal changes, such as those associated with pregnancy, birth control pills or menopause Sexual activity Having a sexually transmitted infection  Your treatment plan is A single Diflucan (fluconazole) 150mg  tablet once.  I have electronically sent this prescription into the pharmacy that you have chosen. If your symptoms do not improve in 72 hours I have sent a second tablet for you to take. I have also prescribed nystatin cream for you to use for the discomfort as it will take about 24 hours for the fluconazole to start helping with symptoms.   Be sure to take all of the medication as directed. Stop taking any medication if you develop a rash, tongue swelling or shortness of breath. Mothers who are breast  feeding should consider pumping and discarding their breast milk while on these antibiotics. However, there is no consensus that infant exposure at these doses would be harmful.  Remember that medication creams can weaken latex condoms. Marland Kitchen   HOME CARE:  Good hygiene may prevent some types of vaginosis from recurring and may relieve some symptoms:  Avoid baths, hot tubs and whirlpool spas. Rinse soap from your outer genital area after a shower, and dry the area well to prevent irritation. Don't use scented or harsh soaps, such as those with deodorant or antibacterial action. Avoid irritants. These include scented tampons and pads. Wipe from front to back after using the toilet. Doing so avoids spreading fecal bacteria to your vagina.  Other things that may help prevent vaginosis include:  Don't douche. Your vagina doesn't require cleansing other than normal bathing. Repetitive douching disrupts the normal organisms that reside in the vagina and can actually increase your risk of vaginal infection. Douching won't clear up a vaginal infection. Use a latex condom. Both female and female latex condoms may help you avoid infections spread by sexual contact. Wear cotton underwear. Also wear pantyhose with a cotton crotch. If you feel comfortable without it, skip wearing underwear to bed. Yeast thrives in [employer] Your symptoms should improve in the next day or two.  GET HELP RIGHT AWAY IF:  You have pain in your lower abdomen ( pelvic area or over your ovaries) You develop nausea or vomiting You develop a fever Your discharge changes or worsens You have persistent pain with intercourse You develop shortness of breath, a rapid pulse, or you  faint.  These symptoms could be signs of problems or infections that need to be evaluated by a medical provider now.  MAKE SURE YOU   Understand these instructions. Will watch your condition. Will get help right away if you are not doing well or  get worse.  Your e-visit answers were reviewed by a board certified advanced clinical practitioner to complete your personal care plan. Depending upon the condition, your plan could have included both over the counter or prescription medications. Please review your pharmacy choice to make sure that you have choses a pharmacy that is open for you to pick up any needed prescription, Your safety is important to [place]. If you have drug allergies check your prescription carefully.   You can use MyChart to ask questions about today's visit, request a non-urgent call back, or ask for a work or school excuse for 24 hours related to this e-Visit. If it has been greater than 24 hours you will need to follow up with your provider, or enter a new e-Visit to address those concerns. You will get a MyChart message within the next two days asking about your experience. I hope that your e-visit has been valuable and will speed your recovery.  Approximately 5 minutes was spent documenting and reviewing patient's chart.

## 2020-08-27 ENCOUNTER — Ambulatory Visit: Payer: 59 | Admitting: Nurse Practitioner

## 2020-08-27 ENCOUNTER — Encounter: Payer: Self-pay | Admitting: Nurse Practitioner

## 2020-08-27 ENCOUNTER — Other Ambulatory Visit: Payer: Self-pay

## 2020-08-27 VITALS — BP 115/65 | HR 76 | Temp 97.1°F | Ht 66.0 in | Wt 198.0 lb

## 2020-08-27 DIAGNOSIS — R3989 Other symptoms and signs involving the genitourinary system: Secondary | ICD-10-CM | POA: Diagnosis not present

## 2020-08-27 LAB — MICROSCOPIC EXAMINATION: Bacteria, UA: NONE SEEN

## 2020-08-27 LAB — URINALYSIS, ROUTINE W REFLEX MICROSCOPIC
Bilirubin, UA: NEGATIVE
Glucose, UA: NEGATIVE
Ketones, UA: NEGATIVE
Nitrite, UA: NEGATIVE
RBC, UA: NEGATIVE
Specific Gravity, UA: 1.025 (ref 1.005–1.030)
Urobilinogen, Ur: 0.2 mg/dL (ref 0.2–1.0)
pH, UA: 6 (ref 5.0–7.5)

## 2020-08-27 MED ORDER — NITROFURANTOIN MONOHYD MACRO 100 MG PO CAPS
100.0000 mg | ORAL_CAPSULE | Freq: Two times a day (BID) | ORAL | 0 refills | Status: DC
Start: 2020-08-27 — End: 2020-09-02

## 2020-08-27 NOTE — Progress Notes (Signed)
Acute Office Visit  Subjective:    Patient ID: Crystal Wong, female    DOB: 1956-01-27, 65 y.o.   MRN: [id number]  Chief Complaint  Patient presents with   urine dark    Dysuria  This is a new problem. Episode onset: in th epast 4-5 days. The problem occurs intermittently. The problem has been unchanged. The patient is experiencing no pain. There has been no fever. Pertinent negatives include no chills, discharge, flank pain or nausea. Associated symptoms comments: Dark urine. She has tried nothing for the symptoms.    Past Medical History:  Diagnosis Date   Anxiety    Asthma    Depression    GERD (gastroesophageal reflux disease)    Heart murmur    Hyperlipidemia    Insomnia     Past Surgical History:  Procedure Laterality Date   ANKLE FRACTURE SURGERY Left    Pins and screws   BREAST LUMPECTOMY Bilateral    left x 1, right x 2   CARPAL TUNNEL RELEASE Right    CARPAL TUNNEL RELEASE Left    COLONOSCOPY     KNEE ARTHROSCOPY Bilateral    PARTIAL HYSTERECTOMY      Family History  Problem Relation Age of Onset   Colon cancer Father 49   Heart disease Mother    Diabetes Mother    Colon polyps Mother    Gallstones Paternal Grandmother    Gallbladder disease Daughter    Esophageal cancer Neg Hx    Stomach cancer Neg Hx    Rectal cancer Neg Hx     Social History   Socioeconomic History   Marital status: Married    Spouse name: Not on file   Number of children: 2   Years of education: Not on file   Highest education level: Not on file  Occupational History   Occupation: retired  Tobacco Use   Smoking status: Never   Smokeless tobacco: Never  Vaping Use   Vaping Use: Never used  Substance and Sexual Activity   Alcohol use: Not Currently   Drug use: No   Sexual activity: Not on file  Other Topics Concern   Not on file  Social History Narrative   Not on file   Social Determinants of Health   Financial Resource Strain: Not on file  Food Insecurity:  Not on file  Transportation Needs: Not on file  Physical Activity: Not on file  Stress: Not on file  Social Connections: Not on file  Intimate Partner Violence: Not on file    Outpatient Medications Prior to Visit  Medication Sig Dispense Refill   Alirocumab (PRALUENT) 150 MG/ML SOAJ Inject 150 mg into the skin every 14 (fourteen) days. 2 mL 11   betamethasone dipropionate 0.05 % lotion Apply topically 2 (two) times daily.     citalopram (CELEXA) 40 MG tablet TAKE 1 TABLET BY MOUTH EVERY DAY 90 tablet 0   dexlansoprazole (DEXILANT) 60 MG capsule Take 1 capsule by mouth daily.     doxycycline (VIBRAMYCIN) 50 MG capsule Take 50 mg by mouth daily.     famotidine (PEPCID) 20 MG tablet Take 1 tablet (20 mg total) by mouth 2 (two) times daily. 180 tablet 1   fluconazole (DIFLUCAN) 150 MG tablet Take 150 mg by mouth every 3 (three) days.     fluconazole (DIFLUCAN) 150 MG tablet Take one tablet on the day you fill the prescription. If you continue to have symptoms then take the second tablet in  3 days. 2 tablet 0   fluticasone (FLONASE) 50 MCG/ACT nasal spray Place 2 sprays into both nostrils daily. 16 g 6   hydrocortisone (ANUSOL-HC) 25 MG suppository PLACE 1 SUPPOSITORY (25 MG TOTAL) RECTALLY 2 (TWO) TIMES DAILY. (Patient taking differently: Place 25 mg rectally as needed.) 12 suppository 0   LORATADINE-D 24HR 10-240 MG 24 hr tablet Take 1 tablet by mouth daily 90 tablet 0   LORazepam (ATIVAN) 1 MG tablet Take 1 tablet (1 mg total) by mouth every 8 (eight) hours as needed. 30 tablet 0   nystatin (MYCOSTATIN) 100000 UNIT/ML suspension Take 5 mLs (500,000 Units total) by mouth 4 (four) times daily. 60 mL 0   nystatin cream (MYCOSTATIN) Apply 1 application topically 2 (two) times daily for 7 days. 14 g 0   pantoprazole (PROTONIX) 40 MG tablet Take 1 tablet (40 mg total) by mouth 4 (four) times daily. 120 tablet 3   rosuvastatin (CRESTOR) 40 MG tablet Take 1 tablet (40 mg total) by mouth daily. 90  tablet 1   VENTOLIN HFA 108 (90 Base) MCG/ACT inhaler INHALE 2 PUFFS BY MOUTH EVERY 6 HOURS AS NEEDED FOR WHEEZING 18 g 0   VITAMIN D, CHOLECALCIFEROL, PO Take 1 tablet by mouth daily.      No facility-administered medications prior to visit.    Allergies  Allergen Reactions   Sulfa Antibiotics Anaphylaxis   Sulfamethoxazole-Trimethoprim Swelling   Elemental Sulfur Other (See Comments)    Other reaction(s): Angioedema (ALLERGY/intolerance) Other reaction(s): Angioedema (ALLERGY/intolerance)    Review of Systems  Constitutional:  Negative for chills.  HENT: Negative.    Gastrointestinal: Negative.  Negative for abdominal pain and nausea.  Genitourinary:  Positive for dysuria. Negative for flank pain.  All other systems reviewed and are negative.     Objective:    Physical Exam Vitals and nursing note reviewed.  Constitutional:      Appearance: Normal appearance.  HENT:     Head: Normocephalic.     Nose: Nose normal.  Eyes:     Conjunctiva/sclera: Conjunctivae normal.  Cardiovascular:     Pulses: Normal pulses.     Heart sounds: Normal heart sounds.  Pulmonary:     Effort: Pulmonary effort is normal.     Breath sounds: Normal breath sounds.  Abdominal:     General: Bowel sounds are normal.  Musculoskeletal:        General: Normal range of motion.  Skin:    Findings: No rash.  Neurological:     Mental Status: She is alert and oriented to person, place, and time.  Psychiatric:        Behavior: Behavior normal.    BP 115/65   Pulse 76   Temp (!) 97.1 F (36.2 C) (Temporal)   Ht 5\' 6"  (1.676 m)   Wt 198 lb (89.8 kg)   SpO2 98%   BMI 31.96 kg/m  Wt Readings from Last 3 Encounters:  08/27/20 198 lb (89.8 kg)  07/21/20 198 lb (89.8 kg)  07/01/20 197 lb 9.6 oz (89.6 kg)    Health Maintenance Due  Topic Date Due   Pneumococcal Vaccine 32-59 Years old (1 - PCV) Never done   Zoster Vaccines- Shingrix (1 of 2) Never done   COVID-19 Vaccine (4 - Booster for  Moderna series) 04/02/2020    There are no preventive care reminders to display for this patient.   Lab Results  Component Value Date   TSH 1.250 03/02/2020   Lab Results  Component Value  Date   WBC 6.8 07/01/2020   HGB 13.9 07/01/2020   HCT 41.5 07/01/2020   MCV 87 07/01/2020   PLT 236 07/01/2020   Lab Results  Component Value Date   NA 139 03/02/2020   K 4.7 03/02/2020   CO2 25 03/02/2020   GLUCOSE 120 (H) 03/02/2020   BUN 18 03/02/2020   CREATININE 0.87 03/02/2020   BILITOT 0.4 03/02/2020   ALKPHOS 94 03/02/2020   AST 19 03/02/2020   ALT 16 03/02/2020   PROT 6.9 03/02/2020   ALBUMIN 4.6 03/02/2020   CALCIUM 10.1 03/02/2020   Lab Results  Component Value Date   CHOL 167 03/02/2020   Lab Results  Component Value Date   HDL 71 03/02/2020   Lab Results  Component Value Date   LDLCALC 75 03/02/2020   Lab Results  Component Value Date   TRIG 123 03/02/2020   Lab Results  Component Value Date   CHOLHDL 2.4 03/02/2020   Lab Results  Component Value Date   HGBA1C 6.4 02/24/2019       Assessment & Plan:   Problem List Items Addressed This Visit       Other   Urine discoloration - Primary    New symptoms of dysuria with dark color urine, completed urinalysis, results pending will treat appropriately. Increase hydration,    Addendum:   Abnormal urinalysis  Nitrofurantoin 100 mg tablet by mouth daily pending cultures.        Relevant Medications   nitrofurantoin, macrocrystal-monohydrate, (MACROBID) 100 MG capsule   Other Relevant Orders   Urinalysis, Routine w reflex microscopic (Completed)   Urine Culture     Meds ordered this encounter  Medications   nitrofurantoin, macrocrystal-monohydrate, (MACROBID) 100 MG capsule    Sig: Take 1 capsule (100 mg total) by mouth 2 (two) times daily. 1 po BId    Dispense:  14 capsule    Refill:  0    Order Specific Question:   Supervising Provider    Answer:   Janora Norlander [[id number]]      Ivy Lynn, NP

## 2020-08-27 NOTE — Assessment & Plan Note (Signed)
New symptoms of dysuria with dark color urine, completed urinalysis, results pending will treat appropriately. Increase hydration,    Addendum:   Abnormal urinalysis  Nitrofurantoin 100 mg tablet by mouth daily pending cultures.

## 2020-08-27 NOTE — Patient Instructions (Signed)
Dysuria ?Dysuria is pain or discomfort during urination. The pain or discomfort may be felt in the part of the body that drains urine from the bladder (urethra) or in the surrounding tissue of the genitals. The pain may also be felt in the groin area, lower abdomen, or lower back. ?You may have to urinate frequently or have the sudden feeling that you have to urinate (urgency). Dysuria can affect anyone, but it is more common in females. Dysuria can be caused by many different things, including: ?Urinary tract infection. ?Kidney stones or bladder stones. ?Certain STIs (sexually transmitted infections), such as chlamydia. ?Dehydration. ?Inflammation of the tissues of the vagina. ?Use of certain medicines. ?Use of certain soaps or scented products that cause irritation. ?Follow these instructions at home: ?Medicines ?Take over-the-counter and prescription medicines only as told by your health care provider. ?If you were prescribed an antibiotic medicine, take it as told by your health care provider. Do not stop taking the antibiotic even if you start to feel better. ?Eating and drinking ? ?Drink enough fluid to keep your urine pale yellow. ?Avoid caffeinated beverages, tea, and alcohol. These beverages can irritate the bladder and make dysuria worse. In males, alcohol may irritate the prostate. ?General instructions ?Watch your condition for any changes. ?Urinate often. Avoid holding urine for long periods of time. ?If you are female, you should wipe from front to back after urinating or having a bowel movement. Use each piece of toilet paper only once. ?Empty your bladder after sex. ?Keep all follow-up visits. This is important. ?If you had any tests done to find the cause of dysuria, it is up to you to get your test results. Ask your health care provider, or the department that is doing the test, when your results will be ready. ?Contact a health care provider if: ?You have a fever. ?You develop pain in your back or  sides. ?You have nausea or vomiting. ?You have blood in your urine. ?You are not urinating as often as you usually do. ?Get help right away if: ?Your pain is severe and not relieved with medicines. ?You cannot eat or drink without vomiting. ?You are confused. ?You have a rapid heartbeat while resting. ?You have shaking or chills. ?You feel extremely weak. ?Summary ?Dysuria is pain or discomfort while urinating. Many different conditions can lead to dysuria. ?If you have dysuria, you may have to urinate frequently or have the sudden feeling that you have to urinate (urgency). ?Watch your condition for any changes. Keep all follow-up visits. ?Make sure that you urinate often and drink enough fluid to keep your urine pale yellow. ?This information is not intended to replace advice given to you by your health care provider. Make sure you discuss any questions you have with your health care provider. ?Document Revised: 10/03/2019 Document Reviewed: 10/03/2019 ?Elsevier Patient Education ? 2022 [employer] ? ?

## 2020-08-30 ENCOUNTER — Telehealth: Payer: Self-pay | Admitting: Nurse Practitioner

## 2020-08-30 LAB — URINE CULTURE

## 2020-08-30 NOTE — Telephone Encounter (Signed)
Patient aware of results of urinalysis and that we are waiting on urine culture results to come back.  Patient has started [facility] prescription sent in on Friday.  Is experiencing some mild nausea with medication so patient was instructed to take on a full stomach.

## 2020-08-31 ENCOUNTER — Other Ambulatory Visit: Payer: Self-pay | Admitting: Nurse Practitioner

## 2020-08-31 ENCOUNTER — Encounter: Payer: Self-pay | Admitting: Nurse Practitioner

## 2020-08-31 DIAGNOSIS — F411 Generalized anxiety disorder: Secondary | ICD-10-CM

## 2020-09-02 ENCOUNTER — Other Ambulatory Visit: Payer: Self-pay | Admitting: Nurse Practitioner

## 2020-09-02 ENCOUNTER — Ambulatory Visit: Payer: 59 | Admitting: Nurse Practitioner

## 2020-09-02 ENCOUNTER — Encounter: Payer: Self-pay | Admitting: Nurse Practitioner

## 2020-09-02 ENCOUNTER — Other Ambulatory Visit: Payer: Self-pay

## 2020-09-02 VITALS — BP 104/67 | HR 70 | Temp 97.9°F | Resp 20 | Ht 66.0 in | Wt 196.0 lb

## 2020-09-02 DIAGNOSIS — F411 Generalized anxiety disorder: Secondary | ICD-10-CM

## 2020-09-02 DIAGNOSIS — F3342 Major depressive disorder, recurrent, in full remission: Secondary | ICD-10-CM | POA: Diagnosis not present

## 2020-09-02 DIAGNOSIS — Z6831 Body mass index (BMI) 31.0-31.9, adult: Secondary | ICD-10-CM

## 2020-09-02 DIAGNOSIS — R739 Hyperglycemia, unspecified: Secondary | ICD-10-CM

## 2020-09-02 DIAGNOSIS — E782 Mixed hyperlipidemia: Secondary | ICD-10-CM

## 2020-09-02 DIAGNOSIS — K2100 Gastro-esophageal reflux disease with esophagitis, without bleeding: Secondary | ICD-10-CM | POA: Diagnosis not present

## 2020-09-02 DIAGNOSIS — F5101 Primary insomnia: Secondary | ICD-10-CM

## 2020-09-02 DIAGNOSIS — R69 Illness, unspecified: Secondary | ICD-10-CM | POA: Diagnosis not present

## 2020-09-02 MED ORDER — FAMOTIDINE 20 MG PO TABS: 20.0000 mg | ORAL_TABLET | Freq: Two times a day (BID) | ORAL | 1 refills | Status: DC

## 2020-09-02 MED ORDER — PRALUENT 150 MG/ML ~~LOC~~ SOAJ: 150.0000 mg | SUBCUTANEOUS | 11 refills | Status: DC

## 2020-09-02 MED ORDER — LORAZEPAM 1 MG PO TABS: 1.0000 mg | ORAL_TABLET | Freq: Three times a day (TID) | ORAL | 0 refills | Status: DC | PRN

## 2020-09-02 MED ORDER — CITALOPRAM HYDROBROMIDE 40 MG PO TABS: 40.0000 mg | ORAL_TABLET | Freq: Every day | ORAL | 0 refills | Status: DC

## 2020-09-02 MED ORDER — DEXLANSOPRAZOLE 60 MG PO CPDR: 1.0000 | DELAYED_RELEASE_CAPSULE | Freq: Every day | ORAL | 1 refills | Status: DC

## 2020-09-02 MED ORDER — ROSUVASTATIN CALCIUM 40 MG PO TABS: 40.0000 mg | ORAL_TABLET | Freq: Every day | ORAL | 1 refills | Status: DC

## 2020-09-02 NOTE — Patient Instructions (Signed)

## 2020-09-02 NOTE — Progress Notes (Signed)
Subjective:    Patient ID: Crystal Wong, female    DOB: Dec 01, 1955, 65 y.o.   MRN: [id number]   Chief Complaint: Medical Management of Chronic Issues    HPI:  1. Mixed hyperlipidemia Does try to wtahc diet and has been walking 3-4 x a week. Is on praulant and doing well. Lab Results  Component Value Date   CHOL 167 03/02/2020   HDL 71 03/02/2020   LDLCALC 75 03/02/2020   TRIG 123 03/02/2020   CHOLHDL 2.4 03/02/2020     2. Gastroesophageal reflux disease with esophagitis without hemorrhage Is on dexilant and ranitidine daily and is working well to keep symptoms under control.  3. Recurrent major depressive disorder, in full remission ([place]) Is doing well since she retired. Is on celexa daily without side effects. Depression screen [facility] 2/9 09/02/2020 08/27/2020 03/02/2020  Decreased Interest 0 0 0  Down, Depressed, Hopeless 0 0 0  PHQ - 2 Score 0 0 0  Altered sleeping 1 - 0  Tired, decreased energy 1 - 0  Change in appetite 0 - 0  Feeling bad or failure about yourself  0 - 0  Trouble concentrating 0 - 0  Moving slowly or fidgety/restless 0 - 0  Suicidal thoughts 0 - 0  PHQ-9 Score 2 - 0  Difficult doing work/chores Not difficult at all - Not difficult at all  Some recent data might be hidden     4. GAD (generalized anxiety disorder) Is on ativan on an as needed basis. Only takes 1/2 tablet 2-3 x a week. She has been out for 2 weeks. GAD 7 : Generalized Anxiety Score 09/02/2020 03/02/2020 08/28/2019 02/20/2019  Nervous, Anxious, on Edge 0 0 0 0  Control/stop worrying 0 0 0 1  Worry too much - different things 1 0 0 0  Trouble relaxing 0 0 0 0  Restless 0 0 0 0  Easily annoyed or irritable 0 0 1 0  Afraid - awful might happen 0 0 0 0  Total GAD 7 Score 1 0 1 1  Anxiety Difficulty Not difficult at all Not difficult at all Not difficult at all Not difficult at all      5. Primary insomnia Sleep ing well since retired.   6. BMI 31.1-31.9 No recent weight  changes Wt Readings from Last 3 Encounters:  09/02/20 196 lb (88.9 kg)  08/27/20 198 lb (89.8 kg)  07/21/20 198 lb (89.8 kg)   BMI Readings from Last 3 Encounters:  09/02/20 31.64 kg/m  08/27/20 31.96 kg/m  07/21/20 31.96 kg/m      Outpatient Encounter Medications as of 09/02/2020  Medication Sig   Alirocumab (PRALUENT) 150 MG/ML SOAJ Inject 150 mg into the skin every 14 (fourteen) days.   betamethasone dipropionate 0.05 % lotion Apply topically 2 (two) times daily.   citalopram (CELEXA) 40 MG tablet TAKE 1 TABLET BY MOUTH EVERY DAY   dexlansoprazole (DEXILANT) 60 MG capsule Take 1 capsule by mouth daily.   doxycycline (VIBRAMYCIN) 50 MG capsule Take 50 mg by mouth daily.   famotidine (PEPCID) 20 MG tablet Take 1 tablet (20 mg total) by mouth 2 (two) times daily.   fluconazole (DIFLUCAN) 150 MG tablet Take 150 mg by mouth every 3 (three) days.   fluticasone (FLONASE) 50 MCG/ACT nasal spray Place 2 sprays into both nostrils daily.   hydrocortisone (ANUSOL-HC) 25 MG suppository PLACE 1 SUPPOSITORY (25 MG TOTAL) RECTALLY 2 (TWO) TIMES DAILY. (Patient taking differently: Place 25 mg rectally as  needed.)   LORATADINE-D 24HR 10-240 MG 24 hr tablet Take 1 tablet by mouth daily   LORazepam (ATIVAN) 1 MG tablet Take 1 tablet (1 mg total) by mouth every 8 (eight) hours as needed.   pantoprazole (PROTONIX) 40 MG tablet Take 1 tablet (40 mg total) by mouth 4 (four) times daily.   rosuvastatin (CRESTOR) 40 MG tablet TAKE 1 TABLET BY MOUTH EVERY DAY   VENTOLIN HFA 108 (90 Base) MCG/ACT inhaler INHALE 2 PUFFS BY MOUTH EVERY 6 HOURS AS NEEDED FOR WHEEZING   VITAMIN D, CHOLECALCIFEROL, PO Take 1 tablet by mouth daily.    [DISCONTINUED] fluconazole (DIFLUCAN) 150 MG tablet Take one tablet on the day you fill the prescription. If you continue to have symptoms then take the second tablet in 3 days.   [DISCONTINUED] nitrofurantoin, macrocrystal-monohydrate, (MACROBID) 100 MG capsule Take 1 capsule (100  mg total) by mouth 2 (two) times daily. 1 po BId   [DISCONTINUED] nystatin (MYCOSTATIN) 100000 UNIT/ML suspension Take 5 mLs (500,000 Units total) by mouth 4 (four) times daily.   No facility-administered encounter medications on file as of 09/02/2020.    Past Surgical History:  Procedure Laterality Date   ANKLE FRACTURE SURGERY Left    Pins and screws   BREAST LUMPECTOMY Bilateral    left x 1, right x 2   CARPAL TUNNEL RELEASE Right    CARPAL TUNNEL RELEASE Left    COLONOSCOPY     KNEE ARTHROSCOPY Bilateral    PARTIAL HYSTERECTOMY      Family History  Problem Relation Age of Onset   Colon cancer Father 68   Heart disease Mother    Diabetes Mother    Colon polyps Mother    Gallstones Paternal Grandmother    Gallbladder disease Daughter    Esophageal cancer Neg Hx    Stomach cancer Neg Hx    Rectal cancer Neg Hx     New complaints: None today  Social history: Lives with her husband  Controlled substance contract: 03/02/20     Review of Systems  Constitutional:  Negative for diaphoresis.  Eyes:  Negative for pain.  Respiratory:  Negative for shortness of breath.   Cardiovascular:  Negative for chest pain, palpitations and leg swelling.  Gastrointestinal:  Negative for abdominal pain.  Endocrine: Negative for polydipsia.  Skin:  Negative for rash.  Neurological:  Negative for dizziness, weakness and headaches.  Hematological:  Does not bruise/bleed easily.  All other systems reviewed and are negative.     Objective:   Physical Exam Vitals and nursing note reviewed.  Constitutional:      General: She is not in acute distress.    Appearance: Normal appearance. She is well-developed.  HENT:     Head: Normocephalic.     Right Ear: Tympanic membrane normal.     Left Ear: Tympanic membrane normal.     Nose: Nose normal.     Mouth/Throat:     Mouth: Mucous membranes are moist.  Eyes:     Pupils: Pupils are equal, round, and reactive to light.  Neck:      Vascular: No carotid bruit or JVD.  Cardiovascular:     Rate and Rhythm: Normal rate and regular rhythm.     Heart sounds: Normal heart sounds.  Pulmonary:     Effort: Pulmonary effort is normal. No respiratory distress.     Breath sounds: Normal breath sounds. No wheezing or rales.  Chest:     Chest wall: No tenderness.  Abdominal:  General: Bowel sounds are normal. There is no distension or abdominal bruit.     Palpations: Abdomen is soft. There is no hepatomegaly, splenomegaly, mass or pulsatile mass.     Tenderness: There is no abdominal tenderness.  Musculoskeletal:        General: Normal range of motion.     Cervical back: Normal range of motion and neck supple.  Lymphadenopathy:     Cervical: No cervical adenopathy.  Skin:    General: Skin is warm and dry.  Neurological:     Mental Status: She is alert and oriented to person, place, and time.     Deep Tendon Reflexes: Reflexes are normal and symmetric.  Psychiatric:        Behavior: Behavior normal.        Thought Content: Thought content normal.        Judgment: Judgment normal.    BP 104/67   Pulse 70   Temp 97.9 F (36.6 C) (Temporal)   Resp 20   Ht 5' 6"  (1.676 m)   Wt 196 lb (88.9 kg)   SpO2 97%   BMI 31.64 kg/m        Assessment & Plan:  Crystal Wong comes in today with chief complaint of Medical Management of Chronic Issues   Diagnosis and orders addressed:  1. Mixed hyperlipidemia Low fat diet - rosuvastatin (CRESTOR) 40 MG tablet; Take 1 tablet (40 mg total) by mouth daily.  Dispense: 90 tablet; Refill: 1 - Alirocumab (PRALUENT) 150 MG/ML SOAJ; Inject 150 mg into the skin every 14 (fourteen) days.  Dispense: 2 mL; Refill: 11 - CBC with Differential/Platelet - CMP14+EGFR - Lipid panel  2. Gastroesophageal reflux disease with esophagitis without hemorrhage Avoid spicy foods Do not eat 2 hours prior to bedtime - famotidine (PEPCID) 20 MG tablet; Take 1 tablet (20 mg total) by mouth 2 (two)  times daily.  Dispense: 180 tablet; Refill: 1 - dexlansoprazole (DEXILANT) 60 MG capsule; Take 1 capsule (60 mg total) by mouth daily.  Dispense: 90 capsule; Refill: 1  3. Recurrent major depressive disorder, in full remission ([place]) Stress management - citalopram (CELEXA) 40 MG tablet; Take 1 tablet (40 mg total) by mouth daily.  Dispense: 90 tablet; Refill: 0  4. GAD (generalized anxiety disorder) - LORazepam (ATIVAN) 1 MG tablet; Take 1 tablet (1 mg total) by mouth every 8 (eight) hours as needed.  Dispense: 30 tablet; Refill: 0  5. Primary insomnia Bedtime routine  6. BMI 31.0-31.9 Discussed diet and exercise for person with BMI >25 Will recheck weight in 3-6 months   Labs pending Health Maintenance reviewed Diet and exercise encouraged  Follow up plan: 6 months   [name] Done, FNP

## 2020-09-03 LAB — LIPID PANEL
Chol/HDL Ratio: 2.9 ratio (ref 0.0–4.4)
Cholesterol, Total: 160 mg/dL (ref 100–199)
HDL: 55 mg/dL (ref >39)
LDL Chol Calc (NIH): 82 mg/dL (ref 0–99)
Triglycerides: 131 mg/dL (ref 0–149)
VLDL Cholesterol Cal: 23 mg/dL (ref 5–40)

## 2020-09-03 LAB — CBC WITH DIFFERENTIAL/PLATELET
Basophils Absolute: 0.1 10*3/uL (ref 0.0–0.2)
Basos: 1 %
EOS (ABSOLUTE): 0.1 10*3/uL (ref 0.0–0.4)
Eos: 3 %
Hematocrit: 41.7 % (ref 34.0–46.6)
Hemoglobin: 14.1 g/dL (ref 11.1–15.9)
Immature Grans (Abs): 0 10*3/uL (ref 0.0–0.1)
Immature Granulocytes: 0 %
Lymphocytes Absolute: 1.5 10*3/uL (ref 0.7–3.1)
Lymphs: 29 %
MCH: 29.5 pg (ref 26.6–33.0)
MCHC: 33.8 g/dL (ref 31.5–35.7)
MCV: 87 fL (ref 79–97)
Monocytes Absolute: 0.3 10*3/uL (ref 0.1–0.9)
Monocytes: 7 %
Neutrophils Absolute: 3 10*3/uL (ref 1.4–7.0)
Neutrophils: 60 %
Platelets: 244 10*3/uL (ref 150–450)
RBC: 4.78 x10E6/uL (ref 3.77–5.28)
RDW: 13.6 % (ref 11.7–15.4)
WBC: 5.1 10*3/uL (ref 3.4–10.8)

## 2020-09-03 LAB — CMP14+EGFR
ALT: 22 IU/L (ref 0–32)
AST: 25 IU/L (ref 0–40)
Albumin/Globulin Ratio: 2.3 — ABNORMAL HIGH (ref 1.2–2.2)
Albumin: 4.9 g/dL — ABNORMAL HIGH (ref 3.8–4.8)
Alkaline Phosphatase: 84 IU/L (ref 44–121)
BUN/Creatinine Ratio: 17 (ref 12–28)
BUN: 17 mg/dL (ref 8–27)
Bilirubin Total: 0.5 mg/dL (ref 0.0–1.2)
CO2: 27 mmol/L (ref 20–29)
Calcium: 10 mg/dL (ref 8.7–10.3)
Chloride: 102 mmol/L (ref 96–106)
Creatinine, Ser: 0.98 mg/dL (ref 0.57–1.00)
Globulin, Total: 2.1 g/dL (ref 1.5–4.5)
Glucose: 118 mg/dL — ABNORMAL HIGH (ref 65–99)
Potassium: 5.1 mmol/L (ref 3.5–5.2)
Sodium: 143 mmol/L (ref 134–144)
Total Protein: 7 g/dL (ref 6.0–8.5)
eGFR: 65 mL/min/{1.73_m2} (ref >59)

## 2020-09-03 NOTE — Addendum Note (Signed)
Addended by: Chevis Pretty on: 09/03/2020 04:22 PM   Modules accepted: Orders

## 2020-09-08 LAB — HGB A1C W/O EAG: Hgb A1c MFr Bld: 6.7 % — ABNORMAL HIGH (ref 4.8–5.6)

## 2020-09-08 LAB — SPECIMEN STATUS REPORT

## 2020-09-13 ENCOUNTER — Telehealth: Payer: Self-pay | Admitting: Nurse Practitioner

## 2020-09-13 NOTE — Telephone Encounter (Signed)
Patient aware of results and verbalizes understanding.  

## 2020-09-27 DIAGNOSIS — L659 Nonscarring hair loss, unspecified: Secondary | ICD-10-CM | POA: Diagnosis not present

## 2020-09-27 DIAGNOSIS — L720 Epidermal cyst: Secondary | ICD-10-CM | POA: Diagnosis not present

## 2020-09-27 DIAGNOSIS — L718 Other rosacea: Secondary | ICD-10-CM | POA: Diagnosis not present

## 2020-09-28 ENCOUNTER — Other Ambulatory Visit: Payer: Self-pay

## 2020-09-28 ENCOUNTER — Ambulatory Visit: Payer: 59 | Admitting: Nurse Practitioner

## 2020-09-28 ENCOUNTER — Encounter: Payer: Self-pay | Admitting: Nurse Practitioner

## 2020-09-28 VITALS — BP 116/74 | HR 82 | Temp 98.4°F | Resp 20 | Ht 66.0 in | Wt 193.0 lb

## 2020-09-28 DIAGNOSIS — E8881 Metabolic syndrome: Secondary | ICD-10-CM | POA: Diagnosis not present

## 2020-09-28 NOTE — Progress Notes (Signed)
   Subjective:    Patient ID: Crystal Wong, female    DOB: [birth year], 65 y.o.   MRN: [id number]  Chief Complaint: Fatigue   HPI Patient come sin today c/o fatigue. She is on a low carb diet. Sh eis eating less that 48 carbs a day. She said she was told by a nurse that her labs showed prediabates and she thought she needed to stop carbs. She has felt terrible since going on this strict of a carb diet.     Review of Systems  Constitutional:  Negative for diaphoresis.  Eyes:  Negative for pain.  Respiratory:  Negative for shortness of breath.   Cardiovascular:  Negative for chest pain, palpitations and leg swelling.  Gastrointestinal:  Negative for abdominal pain.  Endocrine: Negative for polydipsia.  Skin:  Negative for rash.  Neurological:  Negative for dizziness, weakness and headaches.  Hematological:  Does not bruise/bleed easily.  All other systems reviewed and are negative.     Objective:   Physical Exam Vitals and nursing note reviewed.  Constitutional:      Appearance: Normal appearance.  Cardiovascular:     Rate and Rhythm: Normal rate and regular rhythm.     Heart sounds: Normal heart sounds.  Pulmonary:     Effort: Pulmonary effort is normal.     Breath sounds: Normal breath sounds.  Skin:    General: Skin is warm.  Neurological:     General: No focal deficit present.     Mental Status: She is alert and oriented to person, place, and time.  Psychiatric:        Mood and Affect: Mood normal.        Behavior: Behavior normal.   BP 116/74   Pulse 82   Temp 98.4 F (36.9 C) (Temporal)   Resp 20   Ht '5\' 6"'$  (1.676 m)   Wt 193 lb (87.5 kg)   SpO2 96%   BMI 31.15 kg/m         Assessment & Plan:   Crystal Wong in today with chief complaint of Fatigue   1. Metabolic syndrome Discussed carbs- can have 40gm of cars 3x a day with meals. Exercise is improtant    The above assessment and management plan was discussed with the patient. The patient  verbalized understanding of and has agreed to the management plan. Patient is aware to call the clinic if symptoms persist or worsen. Patient is aware when to return to the clinic for a follow-up visit. Patient educated on when it is appropriate to go to the emergency department.   [name] Done, FNP

## 2020-09-28 NOTE — Patient Instructions (Signed)
Metabolic Syndrome Metabolic syndrome occurs when you have a combination of three or more factors that increase your chances of developing cardiovascular disease and diabetes. These factors include: High fasting blood sugar (glucose). High blood triglyceride level. High blood pressure. Low levels of high-density lipoprotein (HDL) blood cholesterol. Having a waist measurement that is: More than 40 inches in men. More than 35 inches in women. Metabolic syndrome is sometimes called insulin resistance syndrome or syndromeX. What are the causes? The exact cause of this condition is not known. It may be related to a combination of the factors that were passed down from your parents (genes) and the things that you do, eat, and drink (lifestyle choices). What increases the risk? You are more likely to develop this condition if you: Eat a diet that is high in calories and saturated fat. Do not exercise regularly. Are obese. Have a family history of type 2 diabetes mellitus. Have insulin resistance. Have a history of gestational diabetes during a previous pregnancy. Have conditions such as cardiovascular disease, nonalcoholic fatty liver disease, or polycystic ovary syndrome (PCOS). Are older. The risk increases with age. Use any tobacco products, including cigarettes, chewing tobacco, or e-cigarettes. What are the signs or symptoms? Metabolic syndrome has no specific symptoms. Having abnormal blood test resultsmay be the only sign of metabolic syndrome. How is this diagnosed? This condition may be diagnosed based on: Your blood pressure measurements. Your waist measurement. Blood tests. Your personal and family medical history. How is this treated? Treatment may include: Lifestyle changes to reduce your risk for heart disease, stroke, and diabetes. These include: Exercise. Weight loss. Eating a healthy diet. Stopping tobacco and nicotine use. Medicines that: Help your body maintain  normal blood glucose levels. Lower your blood pressure and your blood triglyceride levels. You may be referred to a behavioral counselor who will help you make lifestylechanges that will lower your risk for serious disease. Follow these instructions at home: Lifestyle     Exercise regularly, as told by your health care provider. Eat a healthy diet that includes fresh fruits and vegetables, whole grains, lean proteins, and low-fat or nonfat dairy products. Do not use any products that contain nicotine or tobacco, such as cigarettes, e-cigarettes, and chewing tobacco. If you need help quitting, ask your health care provider. Maintain a healthy weight. Work with your health care provider to lose weight safely, if needed. General instructions Take over-the-counter and prescription medicines only as told by your health care provider. If directed, measure your waist regularly and write down the measurements. To measure your waist: Stand up straight. Breathe out. Wrap a measuring tape around the part of your waist that is just above your hip bones. Read and write down the measurement. Keep all follow-up visits. This is important. Contact a health care provider if: You feel very tired. You are extremely thirsty. You urinate a lot more than usual. Your waist gets bigger. You have headaches that do not go away. Get help right away if: You suddenly develop any of the following: Dizziness. Blurry vision. Trouble speaking. Trouble swallowing. Weakness in an arm or leg. Chest pain. Trouble breathing. Your heartbeat feels abnormal. You faint. Summary Metabolic syndrome occurs when you have a combination of three or more factors that increase your chances of developing cardiovascular disease and diabetes. These factors include high fasting blood sugar (glucose), high blood triglyceride level, high blood pressure, low levels of high-density lipoprotein (HDL) blood cholesterol, and a waist  measurement that is more than 40  inches in men or more than 35 inches in women. Metabolic syndrome has no specific symptoms. Having abnormal blood test results may be the only signs of metabolic syndrome. Treatment may include lifestyle changes and medicines to reduce your risk for heart disease, stroke, and diabetes. This information is not intended to replace advice given to you by your health care provider. Make sure you discuss any questions you have with your healthcare provider. Document Revised: 07/14/2019 Document Reviewed: 07/14/2019 Elsevier Patient Education  [place].

## 2020-10-05 ENCOUNTER — Other Ambulatory Visit: Payer: Self-pay | Admitting: Nurse Practitioner

## 2020-10-20 ENCOUNTER — Other Ambulatory Visit: Payer: Self-pay | Admitting: Nurse Practitioner

## 2020-10-20 DIAGNOSIS — F411 Generalized anxiety disorder: Secondary | ICD-10-CM

## 2020-11-05 DIAGNOSIS — L650 Telogen effluvium: Secondary | ICD-10-CM | POA: Diagnosis not present

## 2020-11-05 DIAGNOSIS — L661 Lichen planopilaris: Secondary | ICD-10-CM | POA: Diagnosis not present

## 2020-11-09 ENCOUNTER — Telehealth: Payer: Self-pay | Admitting: *Deleted

## 2020-11-09 NOTE — Telephone Encounter (Signed)
Dexlansoprazole '60MG'$  dr capsules PA started Key: [id number]  PA not need for med - but will do anyway due to qty limits for this med. Plan only covers 90 per 365 days.  Will attempt: Sent to plan today - 9/6

## 2020-11-10 NOTE — Telephone Encounter (Signed)
Approved on September 6 Your PA request has been approved. Additional information will be provided in the approval communication. (Message 1145) [facility] aware

## 2020-11-21 ENCOUNTER — Other Ambulatory Visit: Payer: Self-pay | Admitting: Gastroenterology

## 2020-11-21 ENCOUNTER — Other Ambulatory Visit: Payer: Self-pay | Admitting: Nurse Practitioner

## 2020-11-21 ENCOUNTER — Other Ambulatory Visit: Payer: Self-pay

## 2020-11-21 DIAGNOSIS — F411 Generalized anxiety disorder: Secondary | ICD-10-CM

## 2020-11-21 DIAGNOSIS — J4521 Mild intermittent asthma with (acute) exacerbation: Secondary | ICD-10-CM

## 2020-11-22 MED ORDER — VENTOLIN HFA 108 (90 BASE) MCG/ACT IN AERS: INHALATION_SPRAY | RESPIRATORY_TRACT | 0 refills | Status: DC

## 2020-11-23 ENCOUNTER — Other Ambulatory Visit: Payer: Self-pay

## 2020-11-23 DIAGNOSIS — F411 Generalized anxiety disorder: Secondary | ICD-10-CM

## 2020-11-24 DIAGNOSIS — Z20828 Contact with and (suspected) exposure to other viral communicable diseases: Secondary | ICD-10-CM | POA: Diagnosis not present

## 2020-12-07 ENCOUNTER — Ambulatory Visit (INDEPENDENT_AMBULATORY_CARE_PROVIDER_SITE_OTHER): Payer: 59 | Admitting: Family

## 2020-12-07 ENCOUNTER — Encounter: Payer: Self-pay | Admitting: Family

## 2020-12-07 DIAGNOSIS — U071 COVID-19: Secondary | ICD-10-CM | POA: Diagnosis not present

## 2020-12-07 DIAGNOSIS — J4541 Moderate persistent asthma with (acute) exacerbation: Secondary | ICD-10-CM

## 2020-12-07 MED ORDER — MOLNUPIRAVIR EUA 200MG CAPSULE: 4.0000 | ORAL_CAPSULE | Freq: Two times a day (BID) | ORAL | 0 refills | Status: AC

## 2020-12-07 MED ORDER — BENZONATATE 200 MG PO CAPS: 200.0000 mg | ORAL_CAPSULE | Freq: Three times a day (TID) | ORAL | 1 refills | Status: DC | PRN

## 2020-12-07 NOTE — Progress Notes (Signed)
Virtual Visit  Note Due to COVID-19 pandemic this visit was conducted virtually. This visit type was conducted due to national recommendations for restrictions regarding the COVID-19 Pandemic (e.g. social distancing, sheltering in place) in an effort to limit this patient's exposure and mitigate transmission in our community. All issues noted in this document were discussed and addressed.  A physical exam was not performed with this format.  I connected with Crystal Wong on 12/07/20 at 11:41 AM  by telephone and verified that I am speaking with the correct person using two identifiers. Crystal Wong is currently located at home and her husband  is currently with her  during visit. The provider, Evelina Dun, FNP is located in their office at time of visit.  I discussed the limitations, risks, security and privacy concerns of performing an evaluation and management service by telephone and the availability of in person appointments. I also discussed with the patient that there may be a patient responsible charge related to this service. The patient expressed understanding and agreed to proceed.  Ms. ashtyn, meland are scheduled for a virtual visit with your provider today.    Just as we do with appointments in the office, we must obtain your consent to participate.  Your consent will be active for this visit and any virtual visit you may have with one of our providers in the next 365 days.    If you have a MyChart account, I can also send a copy of this consent to you electronically.  All virtual visits are billed to your insurance company just like a traditional visit in the office.  As this is a virtual visit, video technology does not allow for your provider to perform a traditional examination.  This may limit your provider's ability to fully assess your condition.  If your provider identifies any concerns that need to be evaluated in person or the need to arrange testing such as labs, EKG, etc, we  will make arrangements to do so.    Although advances in technology are sophisticated, we cannot ensure that it will always work on either your end or our end.  If the connection with a video visit is poor, we may have to switch to a telephone visit.  With either a video or telephone visit, we are not always able to ensure that we have a secure connection.   I need to obtain your verbal consent now.   Are you willing to proceed with your visit today?   Crystal Wong has provided verbal consent on 12/07/2020 for a virtual visit (video or telephone).   Evelina Dun, [place] 12/07/2020  11:43 AM    History and Present Illness:  Pt calls the office today with COVID. She reports her symptoms started two days and tested positive yesterday.  Cough This is a recurrent problem. The current episode started in the past 7 days. The problem has been gradually worsening. The problem occurs every few minutes. The cough is Productive of sputum. Associated symptoms include chills, ear congestion, ear pain, a fever (low grade), headaches, myalgias, nasal congestion, postnasal drip and a sore throat. Pertinent negatives include no shortness of breath or wheezing. She has tried rest, OTC inhaler and steroid inhaler for the symptoms. The treatment provided mild relief.     Review of Systems  Constitutional:  Positive for chills and fever (low grade).  HENT:  Positive for ear pain, postnasal drip and sore throat.   Respiratory:  Positive for cough.  Negative for shortness of breath and wheezing.   Musculoskeletal:  Positive for myalgias.  Neurological:  Positive for headaches.    Observations/Objective: No SOB or distress noted, intermittent cough  Assessment and Plan: 1. COVID-19 virus detected COVID positive, rest, force fluids, tylenol as needed, Quarantine for at least 5 days and you are fever free, then must wear a mask out in public from day 6-71, report any worsening symptoms such as increased shortness  of breath, swelling, or continued high fevers. Possible adverse effects discussed with antivirals.  - molnupiravir EUA (LAGEVRIO) 200 mg CAPS capsule; Take 4 capsules (800 mg total) by mouth 2 (two) times daily for 5 days.  Dispense: 40 capsule; Refill: 0 - benzonatate (TESSALON) 200 MG capsule; Take 1 capsule (200 mg total) by mouth 3 (three) times daily as needed.  Dispense: 30 capsule; Refill: 1  2. Moderate persistent asthma with acute exacerbation    I discussed the assessment and treatment plan with the patient. The patient was provided an opportunity to ask questions and all were answered. The patient agreed with the plan and demonstrated an understanding of the instructions.   The patient was advised to call back or seek an in-person evaluation if the symptoms worsen or if the condition fails to improve as anticipated.  The above assessment and management plan was discussed with the patient. The patient verbalized understanding of and has agreed to the management plan. Patient is aware to call the clinic if symptoms persist or worsen. Patient is aware when to return to the clinic for a follow-up visit. Patient educated on when it is appropriate to go to the emergency department.   Time call ended:  11:52 AM   I provided 11 minutes of  non face-to-face time during this encounter.    Evelina Dun, FNP

## 2020-12-12 ENCOUNTER — Other Ambulatory Visit: Payer: Self-pay | Admitting: Gastroenterology

## 2020-12-14 ENCOUNTER — Ambulatory Visit (INDEPENDENT_AMBULATORY_CARE_PROVIDER_SITE_OTHER): Payer: 59 | Admitting: Family Medicine

## 2020-12-14 ENCOUNTER — Encounter: Payer: Self-pay | Admitting: Family Medicine

## 2020-12-14 VITALS — BP 130/74 | HR 71 | Temp 98.7°F | Ht 66.0 in | Wt 193.0 lb

## 2020-12-14 DIAGNOSIS — R0981 Nasal congestion: Secondary | ICD-10-CM | POA: Diagnosis not present

## 2020-12-14 DIAGNOSIS — Z8616 Personal history of COVID-19: Secondary | ICD-10-CM

## 2020-12-14 DIAGNOSIS — R4189 Other symptoms and signs involving cognitive functions and awareness: Secondary | ICD-10-CM | POA: Diagnosis not present

## 2020-12-14 NOTE — Progress Notes (Signed)
Subjective:  Patient ID: Crystal Wong, female    DOB: [birth year], 65 y.o.   MRN: [id number]  Patient Care Team: Chevis Pretty, FNP as PCP - General (Family Medicine) Donato Heinz, MD as PCP - Cardiology (Cardiology) Lavonna Monarch, MD as Consulting Physician (Dermatology)   Chief Complaint:  Covid Exposure (Fatigues x3 days/In  fog, dizziness, fever subsided, coughing, congestion)   HPI: Crystal Wong is a 65 y.o. female presenting on 12/14/2020 for Covid Exposure (Fatigues x3 days/In  fog, dizziness, fever subsided, coughing, congestion)   Pt presents today for follow up after COVID-19 illness. She was diagnosed with COVID-19 on 12/07/2020 and treated with molnupiravir. She completed course of antivirals. States her husband has complete resolve of symptoms but she continues to have congestion with clear sputum production in the mornings, brain fog, and intermittent dizziness. States the dizziness is worse with certain movements and quick position changes. She denies confusion, weakness, headache, fever, chills, fatigue, or syncope. She has been taking Mucinex and trying to drink plenty of water. She has not been taking multivitamins. She does have some fatigue but feels this has improved greatly.    Relevant past medical, surgical, family, and social history reviewed and updated as indicated.  Allergies and medications reviewed and updated. Data reviewed: Chart in Epic.   Past Medical History:  Diagnosis Date   Anxiety    Asthma    Depression    GERD (gastroesophageal reflux disease)    Heart murmur    Hyperlipidemia    Insomnia     Past Surgical History:  Procedure Laterality Date   ANKLE FRACTURE SURGERY Left    Pins and screws   BREAST LUMPECTOMY Bilateral    left x 1, right x 2   CARPAL TUNNEL RELEASE Right    CARPAL TUNNEL RELEASE Left    COLONOSCOPY     KNEE ARTHROSCOPY Bilateral    PARTIAL HYSTERECTOMY      Social History    Socioeconomic History   Marital status: Married    Spouse name: Not on file   Number of children: 2   Years of education: Not on file   Highest education level: Not on file  Occupational History   Occupation: retired  Tobacco Use   Smoking status: Never   Smokeless tobacco: Never  Vaping Use   Vaping Use: Never used  Substance and Sexual Activity   Alcohol use: Not Currently   Drug use: No   Sexual activity: Not on file  Other Topics Concern   Not on file  Social History Narrative   Not on file   Social Determinants of Health   Financial Resource Strain: Not on file  Food Insecurity: Not on file  Transportation Needs: Not on file  Physical Activity: Not on file  Stress: Not on file  Social Connections: Not on file  Intimate Partner Violence: Not on file    Outpatient Encounter Medications as of 12/14/2020  Medication Sig   Alirocumab (PRALUENT) 150 MG/ML SOAJ Inject 150 mg into the skin every 14 (fourteen) days.   benzonatate (TESSALON) 200 MG capsule Take 1 capsule (200 mg total) by mouth 3 (three) times daily as needed.   betamethasone dipropionate 0.05 % lotion Apply topically 2 (two) times daily.   citalopram (CELEXA) 40 MG tablet Take 1 tablet (40 mg total) by mouth daily.   dexlansoprazole (DEXILANT) 60 MG capsule Take 1 capsule (60 mg total) by mouth daily.   famotidine (PEPCID) 20 MG  tablet Take 1 tablet (20 mg total) by mouth 2 (two) times daily.   fluticasone (FLONASE) 50 MCG/ACT nasal spray Place 2 sprays into both nostrils daily.   hydrocortisone (ANUSOL-HC) 25 MG suppository PLACE 1 SUPPOSITORY (25 MG TOTAL) RECTALLY 2 (TWO) TIMES DAILY. (Patient taking differently: Place 25 mg rectally as needed.)   LORATADINE-D 24HR 10-240 MG 24 hr tablet Take 1 tablet by mouth once daily   LORazepam (ATIVAN) 1 MG tablet TAKE 1 TABLET BY MOUTH EVERY 8 HOURS AS NEEDED   rosuvastatin (CRESTOR) 40 MG tablet Take 1 tablet (40 mg total) by mouth daily.   VENTOLIN HFA 108  (90 Base) MCG/ACT inhaler INHALE 2 PUFFS BY MOUTH EVERY 6 HOURS AS NEEDED FOR WHEEZING   VITAMIN D, CHOLECALCIFEROL, PO Take 1 tablet by mouth daily.    No facility-administered encounter medications on file as of 12/14/2020.    Allergies  Allergen Reactions   Sulfa Antibiotics Anaphylaxis   Sulfamethoxazole-Trimethoprim Swelling   Elemental Sulfur Other (See Comments)    Other reaction(s): Angioedema (ALLERGY/intolerance) Other reaction(s): Angioedema (ALLERGY/intolerance)    Review of Systems  Constitutional:  Negative for activity change, appetite change, chills, diaphoresis, fatigue, fever and unexpected weight change.  HENT:  Positive for congestion. Negative for dental problem, drooling, ear discharge, ear pain, facial swelling, hearing loss, mouth sores, nosebleeds, postnasal drip, rhinorrhea, sinus pressure, sinus pain, sneezing, sore throat, tinnitus, trouble swallowing and voice change.   Eyes: Negative.   Respiratory:  Negative for cough, chest tightness, shortness of breath and wheezing.   Cardiovascular:  Negative for chest pain, palpitations and leg swelling.  Gastrointestinal:  Negative for abdominal pain, blood in stool, constipation, diarrhea, nausea and vomiting.  Endocrine: Negative.   Genitourinary:  Negative for decreased urine volume, difficulty urinating, dysuria, frequency and urgency.  Musculoskeletal:  Negative for arthralgias and myalgias.  Skin: Negative.   Allergic/Immunologic: Negative.   Neurological:  Positive for dizziness (intermittent and short lasting). Negative for tremors, seizures, syncope, facial asymmetry, speech difficulty, weakness, light-headedness, numbness and headaches.       Brain fog  Hematological: Negative.   Psychiatric/Behavioral:  Negative for confusion, hallucinations, sleep disturbance and suicidal ideas.   All other systems reviewed and are negative.      Objective:  BP 130/74   Pulse 71   Temp 98.7 F (37.1 C)   Ht 5'  6" (1.676 m)   Wt 193 lb (87.5 kg)   SpO2 96%   BMI 31.15 kg/m    Wt Readings from Last 3 Encounters:  12/14/20 193 lb (87.5 kg)  09/28/20 193 lb (87.5 kg)  09/02/20 196 lb (88.9 kg)    Physical Exam Vitals and nursing note reviewed.  Constitutional:      General: She is not in acute distress.    Appearance: Normal appearance. She is well-developed and well-groomed. She is obese. She is not ill-appearing, toxic-appearing or diaphoretic.  HENT:     Head: Normocephalic and atraumatic.     Jaw: There is normal jaw occlusion.     Right Ear: Hearing, tympanic membrane, ear canal and external ear normal.     Left Ear: Hearing, tympanic membrane, ear canal and external ear normal.     Nose: Nose normal.     Mouth/Throat:     Lips: Pink.     Mouth: Mucous membranes are moist.     Pharynx: Oropharynx is clear. Uvula midline.  Eyes:     General: Lids are normal.     Extraocular Movements:  Extraocular movements intact.     Conjunctiva/sclera: Conjunctivae normal.     Pupils: Pupils are equal, round, and reactive to light.  Neck:     Thyroid: No thyroid mass, thyromegaly or thyroid tenderness.     Vascular: No carotid bruit or JVD.     Trachea: Trachea and phonation normal.  Cardiovascular:     Rate and Rhythm: Normal rate and regular rhythm.     Chest Wall: PMI is not displaced.     Pulses: Normal pulses.     Heart sounds: Normal heart sounds. No murmur heard.   No friction rub. No gallop.  Pulmonary:     Effort: Pulmonary effort is normal. No respiratory distress.     Breath sounds: Normal breath sounds. No wheezing, rhonchi or rales.  Abdominal:     General: Bowel sounds are normal. There is no distension or abdominal bruit.     Palpations: Abdomen is soft. There is no hepatomegaly or splenomegaly.     Tenderness: There is no abdominal tenderness. There is no right CVA tenderness or left CVA tenderness.     Hernia: No hernia is present.  Musculoskeletal:     Cervical back:  Normal range of motion and neck supple.     Right lower leg: No edema.     Left lower leg: No edema.  Lymphadenopathy:     Cervical: No cervical adenopathy.  Skin:    General: Skin is warm and dry.     Capillary Refill: Capillary refill takes less than 2 seconds.     Coloration: Skin is not cyanotic, jaundiced or pale.     Findings: No rash.  Neurological:     General: No focal deficit present.     Mental Status: She is alert and oriented to person, place, and time.     Cranial Nerves: Cranial nerves are intact. No cranial nerve deficit.     Sensory: Sensation is intact. No sensory deficit.     Motor: Motor function is intact. No weakness.     Coordination: Coordination is intact. Coordination normal.     Gait: Gait is intact. Gait normal.     Deep Tendon Reflexes: Reflexes are normal and symmetric. Reflexes normal.  Psychiatric:        Attention and Perception: Attention and perception normal.        Mood and Affect: Mood and affect normal.        Speech: Speech normal.        Behavior: Behavior normal. Behavior is cooperative.        Thought Content: Thought content normal.        Cognition and Memory: Cognition and memory normal.        Judgment: Judgment normal.    Results for orders placed or performed in visit on 09/02/20  CMP14+EGFR  Result Value Ref Range   Glucose 118 (H) 65 - 99 mg/dL   BUN 17 8 - 27 mg/dL   Creatinine, Ser 0.98 0.57 - 1.00 mg/dL   eGFR 65 >59 mL/min/1.73   BUN/Creatinine Ratio 17 12 - 28   Sodium 143 134 - 144 mmol/L   Potassium 5.1 3.5 - 5.2 mmol/L   Chloride 102 96 - 106 mmol/L   CO2 27 20 - 29 mmol/L   Calcium 10.0 8.7 - 10.3 mg/dL   Total Protein 7.0 6.0 - 8.5 g/dL   Albumin 4.9 (H) 3.8 - 4.8 g/dL   Globulin, Total 2.1 1.5 - 4.5 g/dL   Albumin/Globulin Ratio 2.3 (  H) 1.2 - 2.2   Bilirubin Total 0.5 0.0 - 1.2 mg/dL   Alkaline Phosphatase 84 44 - 121 IU/L   AST 25 0 - 40 IU/L   ALT 22 0 - 32 IU/L  CBC with Differential/Platelet  Result  Value Ref Range   WBC 5.1 3.4 - 10.8 x10E3/uL   RBC 4.78 3.77 - 5.28 x10E6/uL   Hemoglobin 14.1 11.1 - 15.9 g/dL   Hematocrit 41.7 34.0 - 46.6 %   MCV 87 79 - 97 fL   MCH 29.5 26.6 - 33.0 pg   MCHC 33.8 31.5 - 35.7 g/dL   RDW 13.6 11.7 - 15.4 %   Platelets 244 150 - 450 x10E3/uL   Neutrophils 60 Not Estab. %   Lymphs 29 Not Estab. %   Monocytes 7 Not Estab. %   Eos 3 Not Estab. %   Basos 1 Not Estab. %   Neutrophils Absolute 3.0 1.4 - 7.0 x10E3/uL   Lymphocytes Absolute 1.5 0.7 - 3.1 x10E3/uL   Monocytes Absolute 0.3 0.1 - 0.9 x10E3/uL   EOS (ABSOLUTE) 0.1 0.0 - 0.4 x10E3/uL   Basophils Absolute 0.1 0.0 - 0.2 x10E3/uL   Immature Granulocytes 0 Not Estab. %   Immature Grans (Abs) 0.0 0.0 - 0.1 x10E3/uL  Lipid panel  Result Value Ref Range   Cholesterol, Total 160 100 - 199 mg/dL   Triglycerides 131 0 - 149 mg/dL   HDL 55 >39 mg/dL   VLDL Cholesterol Cal 23 5 - 40 mg/dL   LDL Chol Calc (NIH) 82 0 - 99 mg/dL   Chol/HDL Ratio 2.9 0.0 - 4.4 ratio  Hgb A1c w/o eAG  Result Value Ref Range   Hgb A1c MFr Bld 6.7 (H) 4.8 - 5.6 %  Specimen status report  Result Value Ref Range   specimen status report Comment        Pertinent labs & imaging results that were available during my care of the patient were reviewed by me and considered in my medical decision making.  Assessment & Plan:  Srah was seen today for covid exposure.  Diagnoses and all orders for this visit:  History of COVID-19 Brain fog Head congestion COVID positive 12/07/2020, took molnupiravir as prescribed. Has improved greatly but continues to have slight nasal congestion, feeling "foggy", and intermittent dizziness. No cough, shortness of breath, wheezing, or fever. Symptomatic care discussed in detail including adequate hydration, Mucinex with plenty of water, and good multivitamin with zinc, B complex, and Vit D 3. Pt aware to report any new, worsening, or persistent symptoms. Educated on COVID-19 disease process  and guidelines for quarantine.     Continue all other maintenance medications.  Follow up plan: Return if symptoms worsen or fail to improve.   Continue healthy lifestyle choices, including diet (rich in fruits, vegetables, and lean proteins, and low in salt and simple carbohydrates) and exercise (at least 30 minutes of moderate physical activity daily).  Educational handout given for COVID management at home  The above assessment and management plan was discussed with the patient. The patient verbalized understanding of and has agreed to the management plan. Patient is aware to call the clinic if they develop any new symptoms or if symptoms persist or worsen. Patient is aware when to return to the clinic for a follow-up visit. Patient educated on when it is appropriate to go to the emergency department.   Monia Pouch, FNP-C [facility] [phone]

## 2021-01-03 ENCOUNTER — Other Ambulatory Visit: Payer: Self-pay | Admitting: Nurse Practitioner

## 2021-01-03 DIAGNOSIS — K2100 Gastro-esophageal reflux disease with esophagitis, without bleeding: Secondary | ICD-10-CM

## 2021-01-09 ENCOUNTER — Other Ambulatory Visit: Payer: Self-pay | Admitting: Nurse Practitioner

## 2021-01-09 DIAGNOSIS — F3342 Major depressive disorder, recurrent, in full remission: Secondary | ICD-10-CM

## 2021-01-17 ENCOUNTER — Other Ambulatory Visit: Payer: Self-pay | Admitting: Nurse Practitioner

## 2021-01-17 DIAGNOSIS — F411 Generalized anxiety disorder: Secondary | ICD-10-CM

## 2021-01-18 DIAGNOSIS — Z6832 Body mass index (BMI) 32.0-32.9, adult: Secondary | ICD-10-CM | POA: Diagnosis not present

## 2021-01-18 DIAGNOSIS — E785 Hyperlipidemia, unspecified: Secondary | ICD-10-CM | POA: Diagnosis not present

## 2021-01-18 DIAGNOSIS — E669 Obesity, unspecified: Secondary | ICD-10-CM | POA: Diagnosis not present

## 2021-01-18 DIAGNOSIS — R69 Illness, unspecified: Secondary | ICD-10-CM | POA: Diagnosis not present

## 2021-01-18 DIAGNOSIS — J45909 Unspecified asthma, uncomplicated: Secondary | ICD-10-CM | POA: Diagnosis not present

## 2021-01-18 DIAGNOSIS — Z79899 Other long term (current) drug therapy: Secondary | ICD-10-CM | POA: Diagnosis not present

## 2021-01-18 DIAGNOSIS — R32 Unspecified urinary incontinence: Secondary | ICD-10-CM | POA: Diagnosis not present

## 2021-01-18 DIAGNOSIS — L659 Nonscarring hair loss, unspecified: Secondary | ICD-10-CM | POA: Diagnosis not present

## 2021-01-18 DIAGNOSIS — K219 Gastro-esophageal reflux disease without esophagitis: Secondary | ICD-10-CM | POA: Diagnosis not present

## 2021-01-18 NOTE — Telephone Encounter (Signed)
NTBS.

## 2021-01-25 DIAGNOSIS — M179 Osteoarthritis of knee, unspecified: Secondary | ICD-10-CM | POA: Diagnosis not present

## 2021-01-25 DIAGNOSIS — M25562 Pain in left knee: Secondary | ICD-10-CM | POA: Diagnosis not present

## 2021-01-25 DIAGNOSIS — M1711 Unilateral primary osteoarthritis, right knee: Secondary | ICD-10-CM | POA: Diagnosis not present

## 2021-02-01 ENCOUNTER — Other Ambulatory Visit: Payer: Self-pay | Admitting: Nurse Practitioner

## 2021-02-01 DIAGNOSIS — F3342 Major depressive disorder, recurrent, in full remission: Secondary | ICD-10-CM

## 2021-02-04 ENCOUNTER — Other Ambulatory Visit: Payer: Self-pay | Admitting: Nurse Practitioner

## 2021-02-14 DIAGNOSIS — L661 Lichen planopilaris: Secondary | ICD-10-CM | POA: Diagnosis not present

## 2021-02-14 DIAGNOSIS — L723 Sebaceous cyst: Secondary | ICD-10-CM | POA: Diagnosis not present

## 2021-02-14 DIAGNOSIS — L720 Epidermal cyst: Secondary | ICD-10-CM | POA: Diagnosis not present

## 2021-02-14 DIAGNOSIS — L821 Other seborrheic keratosis: Secondary | ICD-10-CM | POA: Diagnosis not present

## 2021-02-25 ENCOUNTER — Telehealth: Payer: Self-pay | Admitting: Nurse Practitioner

## 2021-02-25 DIAGNOSIS — E782 Mixed hyperlipidemia: Secondary | ICD-10-CM

## 2021-02-25 MED ORDER — PRALUENT 150 MG/ML ~~LOC~~ SOAJ: 150.0000 mg | SUBCUTANEOUS | 0 refills | Status: DC

## 2021-02-25 NOTE — Telephone Encounter (Signed)
Refill sent to pharmacy, patient aware ?

## 2021-02-25 NOTE — Telephone Encounter (Signed)
°  Prescription Request  02/25/2021  Is this a "Controlled Substance" medicine? no  Have you seen your PCP in the last 2 weeks? NO  If YES, route message to pool  -  If NO, patient needs to be scheduled for appointment.  What is the name of the medication or equipment? Alirocumab 150 mg- Patient has appt 12-29 with MMM  Have you contacted your pharmacy to request a refill? YES   Which pharmacy would you like this sent to? [facility] in [place]   Patient notified that their request is being sent to the clinical staff for review and that they should receive a response within 2 business days.

## 2021-02-28 ENCOUNTER — Telehealth: Payer: Self-pay | Admitting: Nurse Practitioner

## 2021-02-28 NOTE — Telephone Encounter (Signed)
° °  Pt called to report that over the last three days, she has been having mild intermittent chest pain.  No assoc Ss.  She thinks it's b/c of stress.  I advised that if she is actively having chest pain, she should be seen today via urgent care or ED.  Caller verbalized understanding and was grateful for the call back.  Murray Hodgkins, NP 02/28/2021, 1:57 PM

## 2021-03-02 ENCOUNTER — Telehealth: Payer: Self-pay | Admitting: *Deleted

## 2021-03-02 NOTE — Telephone Encounter (Signed)
Crystal Wong ([facility] Callas) [phone] Praluent 150MG /ML auto-injectors     Status: Sent to Plan

## 2021-03-03 ENCOUNTER — Ambulatory Visit (INDEPENDENT_AMBULATORY_CARE_PROVIDER_SITE_OTHER): Payer: 59 | Admitting: Nurse Practitioner

## 2021-03-03 ENCOUNTER — Telehealth: Payer: Self-pay | Admitting: Nurse Practitioner

## 2021-03-03 ENCOUNTER — Telehealth: Payer: Self-pay | Admitting: Cardiology

## 2021-03-03 ENCOUNTER — Encounter: Payer: Self-pay | Admitting: Nurse Practitioner

## 2021-03-03 VITALS — BP 135/77 | HR 72 | Temp 97.0°F | Ht 66.0 in | Wt 196.6 lb

## 2021-03-03 DIAGNOSIS — F3342 Major depressive disorder, recurrent, in full remission: Secondary | ICD-10-CM | POA: Diagnosis not present

## 2021-03-03 DIAGNOSIS — E782 Mixed hyperlipidemia: Secondary | ICD-10-CM | POA: Diagnosis not present

## 2021-03-03 DIAGNOSIS — Z1211 Encounter for screening for malignant neoplasm of colon: Secondary | ICD-10-CM

## 2021-03-03 DIAGNOSIS — J4541 Moderate persistent asthma with (acute) exacerbation: Secondary | ICD-10-CM

## 2021-03-03 DIAGNOSIS — F5101 Primary insomnia: Secondary | ICD-10-CM

## 2021-03-03 DIAGNOSIS — K2100 Gastro-esophageal reflux disease with esophagitis, without bleeding: Secondary | ICD-10-CM | POA: Diagnosis not present

## 2021-03-03 DIAGNOSIS — R69 Illness, unspecified: Secondary | ICD-10-CM | POA: Diagnosis not present

## 2021-03-03 DIAGNOSIS — F411 Generalized anxiety disorder: Secondary | ICD-10-CM

## 2021-03-03 DIAGNOSIS — Z6833 Body mass index (BMI) 33.0-33.9, adult: Secondary | ICD-10-CM | POA: Diagnosis not present

## 2021-03-03 LAB — LIPID PANEL
Chol/HDL Ratio: 3.6 ratio (ref 0.0–4.4)
Cholesterol, Total: 237 mg/dL — ABNORMAL HIGH (ref 100–199)
HDL: 66 mg/dL (ref >39)
LDL Chol Calc (NIH): 157 mg/dL — ABNORMAL HIGH (ref 0–99)
Triglycerides: 81 mg/dL (ref 0–149)
VLDL Cholesterol Cal: 14 mg/dL (ref 5–40)

## 2021-03-03 LAB — CBC WITH DIFFERENTIAL/PLATELET
Basophils Absolute: 0.1 10*3/uL (ref 0.0–0.2)
Basos: 1 %
EOS (ABSOLUTE): 0.2 10*3/uL (ref 0.0–0.4)
Eos: 3 %
Hematocrit: 41.1 % (ref 34.0–46.6)
Hemoglobin: 13.7 g/dL (ref 11.1–15.9)
Immature Grans (Abs): 0 10*3/uL (ref 0.0–0.1)
Immature Granulocytes: 0 %
Lymphocytes Absolute: 1.8 10*3/uL (ref 0.7–3.1)
Lymphs: 31 %
MCH: 29 pg (ref 26.6–33.0)
MCHC: 33.3 g/dL (ref 31.5–35.7)
MCV: 87 fL (ref 79–97)
Monocytes Absolute: 0.4 10*3/uL (ref 0.1–0.9)
Monocytes: 7 %
Neutrophils Absolute: 3.2 10*3/uL (ref 1.4–7.0)
Neutrophils: 58 %
Platelets: 239 10*3/uL (ref 150–450)
RBC: 4.72 x10E6/uL (ref 3.77–5.28)
RDW: 13.2 % (ref 11.7–15.4)
WBC: 5.7 10*3/uL (ref 3.4–10.8)

## 2021-03-03 LAB — CMP14+EGFR
ALT: 22 IU/L (ref 0–32)
AST: 19 IU/L (ref 0–40)
Albumin/Globulin Ratio: 2.3 — ABNORMAL HIGH (ref 1.2–2.2)
Albumin: 4.5 g/dL (ref 3.8–4.8)
Alkaline Phosphatase: 77 IU/L (ref 44–121)
BUN/Creatinine Ratio: 25 (ref 12–28)
BUN: 20 mg/dL (ref 8–27)
Bilirubin Total: 0.3 mg/dL (ref 0.0–1.2)
CO2: 25 mmol/L (ref 20–29)
Calcium: 9.8 mg/dL (ref 8.7–10.3)
Chloride: 102 mmol/L (ref 96–106)
Creatinine, Ser: 0.79 mg/dL (ref 0.57–1.00)
Globulin, Total: 2 g/dL (ref 1.5–4.5)
Glucose: 126 mg/dL — ABNORMAL HIGH (ref 70–99)
Potassium: 4.5 mmol/L (ref 3.5–5.2)
Sodium: 140 mmol/L (ref 134–144)
Total Protein: 6.5 g/dL (ref 6.0–8.5)
eGFR: 84 mL/min/{1.73_m2} (ref >59)

## 2021-03-03 MED ORDER — DEXLANSOPRAZOLE 60 MG PO CPDR: 60.0000 mg | DELAYED_RELEASE_CAPSULE | Freq: Every day | ORAL | 1 refills | Status: DC

## 2021-03-03 MED ORDER — CITALOPRAM HYDROBROMIDE 40 MG PO TABS
40.0000 mg | ORAL_TABLET | Freq: Every day | ORAL | 1 refills | Status: DC
Start: 2021-03-03 — End: 2021-05-26

## 2021-03-03 MED ORDER — ROSUVASTATIN CALCIUM 40 MG PO TABS
40.0000 mg | ORAL_TABLET | Freq: Every day | ORAL | 1 refills | Status: DC
Start: 2021-03-03 — End: 2021-07-19

## 2021-03-03 MED ORDER — LORAZEPAM 1 MG PO TABS: 1.0000 mg | ORAL_TABLET | Freq: Three times a day (TID) | ORAL | 5 refills | Status: DC | PRN

## 2021-03-03 MED ORDER — FAMOTIDINE 20 MG PO TABS: 20.0000 mg | ORAL_TABLET | Freq: Two times a day (BID) | ORAL | 1 refills | Status: DC

## 2021-03-03 NOTE — Telephone Encounter (Signed)
Spoke with patient. She reported an "uncomfortable feeling, burning" in her chest 2 days ago. She related it to her anxiety and constipation. She did have 3 bowel movements and passed gas, which made her chest feel better. She is taking all of her medications. Her blood pressure last night was 135/76. We discussed her GERD symptoms and chest pain related to heart. She voiced understanding that if she has chest pain/pressure, jaw, arm, or back pain she will go to the emergency room.

## 2021-03-03 NOTE — Telephone Encounter (Signed)
Pt c/o of Chest Pain: STAT if CP now or developed within 24 hours  1. Are you having CP right now? no  2. Are you experiencing any other symptoms (ex. SOB, nausea, vomiting, sweating)? no  3. How long have you been experiencing CP? 2 days ago  4. Is your CP continuous or coming and going? Came and went  5. Have you taken Nitroglycerin? no   Patient states she had some chest discomfort 2 days ago and it concerned her. She says she thinks it may have been just constipation and gas and has not had symptoms since. ?

## 2021-03-03 NOTE — Telephone Encounter (Signed)
Left message for pt informing that she may receive her tdap at anytime. She can wait until her next visit or can schedule a nurse visit sooner if she needs it.

## 2021-03-03 NOTE — Progress Notes (Signed)
Subjective:    Patient ID: Crystal Wong, female    DOB: [birth year], 65 y.o.   MRN: [id number]  Chief Complaint: Medical Management of Chronic Issues and Constipation (Patient states it started beginning of the month )    HPI:  Crystal Wong is a 65 y.o. who identifies as a female who was assigned female at birth.   Social history: Lives with: lives with her husband  Work history: retired from [employer] in today for follow up of the following chronic medical issues:  1. Mixed hyperlipidemia Does try to watch diet. She has been exercising daily. Lab Results  Component Value Date   CHOL 160 09/02/2020   HDL 55 09/02/2020   LDLCALC 82 09/02/2020   TRIG 131 09/02/2020   CHOLHDL 2.9 09/02/2020     2. Gastroesophageal reflux disease with esophagitis without hemorrhage Is on dexilant and pepcid daily. Seems to be working well for her.  3. Moderate persistent asthma with acute exacerbation Has been doing well.  4. Recurrent major depressive disorder, in full remission ([place]) Is still on celexa and is doing well. Depression screen [facility] 2/9 03/03/2021 09/28/2020 09/02/2020  Decreased Interest 0 0 0  Down, Depressed, Hopeless 1 0 0  PHQ - 2 Score 1 0 0  Altered sleeping 0 - 1  Tired, decreased energy 1 - 1  Change in appetite 1 - 0  Feeling bad or failure about yourself  0 - 0  Trouble concentrating 0 - 0  Moving slowly or fidgety/restless 0 - 0  Suicidal thoughts 0 - 0  PHQ-9 Score 3 - 2  Difficult doing work/chores Not difficult at all - Not difficult at all  Some recent data might be hidden     5. GAD (generalized anxiety disorder) Is on ativan daily GAD 7 : Generalized Anxiety Score 03/03/2021 09/02/2020 03/02/2020 08/28/2019  Nervous, Anxious, on Edge 0 0 0 0  Control/stop worrying 1 0 0 0  Worry too much - different things 0 1 0 0  Trouble relaxing 0 0 0 0  Restless 0 0 0 0  Easily annoyed or irritable 0 0 0 1  Afraid - awful might happen 0 0 0 0   Total GAD 7 Score 1 1 0 1  Anxiety Difficulty Somewhat difficult Not difficult at all Not difficult at all Not difficult at all      6. Primary insomnia Is on no sleep aids. Has been sleeping well 7-8 hours a night  7. BMI 33.0-33.9,adult No recent weight changes Wt Readings from Last 3 Encounters:  03/03/21 196 lb 9.6 oz (89.2 kg)  12/14/20 193 lb (87.5 kg)  09/28/20 193 lb (87.5 kg)   BMI Readings from Last 3 Encounters:  03/03/21 31.73 kg/m  12/14/20 31.15 kg/m  09/28/20 31.15 kg/m      New complaints: None today  Allergies  Allergen Reactions   Sulfa Antibiotics Anaphylaxis   Sulfamethoxazole-Trimethoprim Swelling   Elemental Sulfur Other (See Comments)    Other reaction(s): Angioedema (ALLERGY/intolerance) Other reaction(s): Angioedema (ALLERGY/intolerance)   Outpatient Encounter Medications as of 03/03/2021  Medication Sig   Alirocumab (PRALUENT) 150 MG/ML SOAJ Inject 150 mg into the skin every 14 (fourteen) days.   betamethasone dipropionate 0.05 % lotion Apply topically 2 (two) times daily.   citalopram (CELEXA) 40 MG tablet TAKE 1 TABLET BY MOUTH EVERY DAY   dexlansoprazole (DEXILANT) 60 MG capsule TAKE 1 CAPSULE BY MOUTH EVERY DAY   famotidine (PEPCID) 20 MG  tablet Take 1 tablet (20 mg total) by mouth 2 (two) times daily.   fluticasone (FLONASE) 50 MCG/ACT nasal spray Place 2 sprays into both nostrils daily.   hydrocortisone (ANUSOL-HC) 25 MG suppository PLACE 1 SUPPOSITORY (25 MG TOTAL) RECTALLY 2 (TWO) TIMES DAILY. (Patient taking differently: Place 25 mg rectally as needed.)   LORATADINE-D 24HR 10-240 MG 24 hr tablet Take 1 tablet by mouth once daily   LORazepam (ATIVAN) 1 MG tablet TAKE 1 TABLET BY MOUTH EVERY 8 HOURS AS NEEDED   rosuvastatin (CRESTOR) 40 MG tablet Take 1 tablet (40 mg total) by mouth daily.   VENTOLIN HFA 108 (90 Base) MCG/ACT inhaler INHALE 2 PUFFS BY MOUTH EVERY 6 HOURS AS NEEDED FOR WHEEZING   VITAMIN D, CHOLECALCIFEROL, PO Take  1 tablet by mouth daily.    [DISCONTINUED] benzonatate (TESSALON) 200 MG capsule Take 1 capsule (200 mg total) by mouth 3 (three) times daily as needed.   No facility-administered encounter medications on file as of 03/03/2021.    Past Surgical History:  Procedure Laterality Date   ANKLE FRACTURE SURGERY Left    Pins and screws   BREAST LUMPECTOMY Bilateral    left x 1, right x 2   CARPAL TUNNEL RELEASE Right    CARPAL TUNNEL RELEASE Left    COLONOSCOPY     KNEE ARTHROSCOPY Bilateral    PARTIAL HYSTERECTOMY      Family History  Problem Relation Age of Onset   Colon cancer Father 46   Heart disease Mother    Diabetes Mother    Colon polyps Mother    Gallstones Paternal Grandmother    Gallbladder disease Daughter    Esophageal cancer Neg Hx    Stomach cancer Neg Hx    Rectal cancer Neg Hx      Controlled substance contract: 03/02/20     Review of Systems  Constitutional:  Negative for diaphoresis.  Eyes:  Negative for pain.  Respiratory:  Negative for shortness of breath.   Cardiovascular:  Negative for chest pain, palpitations and leg swelling.  Gastrointestinal:  Negative for abdominal pain.  Endocrine: Negative for polydipsia.  Skin:  Negative for rash.  Neurological:  Negative for dizziness, weakness and headaches.  Hematological:  Does not bruise/bleed easily.  All other systems reviewed and are negative.     Objective:   Physical Exam Vitals and nursing note reviewed.  Constitutional:      General: She is not in acute distress.    Appearance: Normal appearance. She is well-developed.  HENT:     Head: Normocephalic.     Right Ear: Tympanic membrane normal.     Left Ear: Tympanic membrane normal.     Nose: Nose normal.     Mouth/Throat:     Mouth: Mucous membranes are moist.  Eyes:     Pupils: Pupils are equal, round, and reactive to light.  Neck:     Vascular: No carotid bruit or JVD.  Cardiovascular:     Rate and Rhythm: Normal rate and regular  rhythm.     Heart sounds: Normal heart sounds.  Pulmonary:     Effort: Pulmonary effort is normal. No respiratory distress.     Breath sounds: Normal breath sounds. No wheezing or rales.  Chest:     Chest wall: No tenderness.  Abdominal:     General: Bowel sounds are normal. There is no distension or abdominal bruit.     Palpations: Abdomen is soft. There is no hepatomegaly, splenomegaly, mass or  pulsatile mass.     Tenderness: There is no abdominal tenderness.  Musculoskeletal:        General: Normal range of motion.     Cervical back: Normal range of motion and neck supple.  Lymphadenopathy:     Cervical: No cervical adenopathy.  Skin:    General: Skin is warm and dry.  Neurological:     Mental Status: She is alert and oriented to person, place, and time.     Deep Tendon Reflexes: Reflexes are normal and symmetric.  Psychiatric:        Behavior: Behavior normal.        Thought Content: Thought content normal.        Judgment: Judgment normal.    BP 135/77    Pulse 72    Temp (!) 97 F (36.1 C) (Temporal)    Ht _0  (1.676 m)    Wt 196 lb 9.6 oz (89.2 kg)    SpO2 98%    BMI 31.73 kg/m        Assessment & Plan:   Crystal Wong comes in today with chief complaint of Medical Management of Chronic Issues and Constipation (Patient states it started beginning of the month )   Diagnosis and orders addressed:  1. Mixed hyperlipidemia Low fat diet - rosuvastatin (CRESTOR) 40 MG tablet; Take 1 tablet (40 mg total) by mouth daily.  Dispense: 90 tablet; Refill: 1 - CBC with Differential/Platelet - CMP14+EGFR - Lipid panel  2. Gastroesophageal reflux disease with esophagitis without hemorrhage Avoid spicy foods Do not eat 2 hours prior to bedtime - dexlansoprazole (DEXILANT) 60 MG capsule; Take 1 capsule (60 mg total) by mouth daily.  Dispense: 90 capsule; Refill: 1 - famotidine (PEPCID) 20 MG tablet; Take 1 tablet (20 mg total) by mouth 2 (two) times daily.  Dispense: 180  tablet; Refill: 1  3. Moderate persistent asthma with acute exacerbation  4. Recurrent major depressive disorder, in full remission ([place]) Stress management - citalopram (CELEXA) 40 MG tablet; Take 1 tablet (40 mg total) by mouth daily.  Dispense: 90 tablet; Refill: 1  5. GAD (generalized anxiety disorder) - LORazepam (ATIVAN) 1 MG tablet; Take 1 tablet (1 mg total) by mouth every 8 (eight) hours as needed.  Dispense: 30 tablet; Refill: 5  6. Primary insomnia Bedtime routine  7. BMI 33.0-33.9,adult Discussed diet and exercise for person with BMI >25 Will recheck weight in 3-6 months   8. Colon cancer screening - Cologuard   Labs pending Health Maintenance reviewed Diet and exercise encouraged  Follow up plan: 6 months   [name] Done, FNP

## 2021-03-03 NOTE — Patient Instructions (Signed)
Exercising to Stay Healthy °To become healthy and stay healthy, it is recommended that you do moderate-intensity and vigorous-intensity exercise. You can tell that you are exercising at a moderate intensity if your heart starts beating faster and you start breathing faster but can still hold a conversation. You can tell that you are exercising at a vigorous intensity if you are breathing much harder and faster and cannot hold a conversation while exercising. °How can exercise benefit me? °Exercising regularly is important. It has many health benefits, such as: °Improving overall fitness, flexibility, and endurance. °Increasing bone density. °Helping with weight control. °Decreasing body fat. °Increasing muscle strength and endurance. °Reducing stress and tension, anxiety, depression, or anger. °Improving overall health. °What guidelines should I follow while exercising? °Before you start a new exercise program, talk with your health care provider. °Do not exercise so much that you hurt yourself, feel dizzy, or get very short of breath. °Wear comfortable clothes and wear shoes with good support. °Drink plenty of water while you exercise to prevent dehydration or heat stroke. °Work out until your breathing and your heartbeat get faster (moderate intensity). °How often should I exercise? °Choose an activity that you enjoy, and set realistic goals. Your health care provider can help you make an activity plan that is individually designed and works best for you. °Exercise regularly as told by your health care provider. This may include: °Doing strength training two times a week, such as: °Lifting weights. °Using resistance bands. °Push-ups. °Sit-ups. °Yoga. °Doing a certain intensity of exercise for a given amount of time. Choose from these options: °A total of 150 minutes of moderate-intensity exercise every week. °A total of 75 minutes of vigorous-intensity exercise every week. °A mix of moderate-intensity and  vigorous-intensity exercise every week. °Children, pregnant women, people who have not exercised regularly, people who are overweight, and older adults may need to talk with a health care provider about what activities are safe to perform. If you have a medical condition, be sure to talk with your health care provider before you start a new exercise program. °What are some exercise ideas? °Moderate-intensity exercise ideas include: °Walking 1 mile (1.6 km) in about 15 minutes. °Biking. °Hiking. °Golfing. °Dancing. °Water aerobics. °Vigorous-intensity exercise ideas include: °Walking 4.5 miles (7.2 km) or more in about 1 hour. °Jogging or running 5 miles (8 km) in about 1 hour. °Biking 10 miles (16.1 km) or more in about 1 hour. °Lap swimming. °Roller-skating or in-line skating. °Cross-country skiing. °Vigorous competitive sports, such as football, basketball, and soccer. °Jumping rope. °Aerobic dancing. °What are some everyday activities that can help me get exercise? °Yard work, such as: °Pushing a lawn mower. °Raking and bagging leaves. °Washing your car. °Pushing a stroller. °Shoveling snow. °Gardening. °Washing windows or floors. °How can I be more active in my day-to-day activities? °Use stairs instead of an elevator. °Take a walk during your lunch break. °If you drive, park your car farther away from your work or school. °If you take public transportation, get off one stop early and walk the rest of the way. °Stand up or walk around during all of your indoor phone calls. °Get up, stretch, and walk around every 30 minutes throughout the day. °Enjoy exercise with a friend. Support to continue exercising will help you keep a regular routine of activity. °Where to find more information °You can find more information about exercising to stay healthy from: °U.S. Department of Health and Human Services: www.hhs.gov °Centers for Disease Control and Prevention (  CDC): www.cdc.gov °Summary °Exercising regularly is  important. It will improve your overall fitness, flexibility, and endurance. °Regular exercise will also improve your overall health. It can help you control your weight, reduce stress, and improve your bone density. °Do not exercise so much that you hurt yourself, feel dizzy, or get very short of breath. °Before you start a new exercise program, talk with your health care provider. °This information is not intended to replace advice given to you by your health care provider. Make sure you discuss any questions you have with your health care provider. °Document Revised: 06/18/2020 Document Reviewed: 06/18/2020 °Elsevier Patient Education © 2022 [employer] Inc. ° °

## 2021-03-04 NOTE — Telephone Encounter (Signed)
Agree with plan 

## 2021-03-04 NOTE — Telephone Encounter (Addendum)
Received faxed questions-started to answer the questions and noticed that pt's LDL since being on the Praluent for 6 months has doubled almost from 82 to 157. When pt was on Repatha 1 year ago she had went from LDL of 205 down to 75 and maintained that LDL. Will wait on MMM to look at these numbers prior to proceeding with the PA.

## 2021-03-05 NOTE — Telephone Encounter (Signed)
We certainly can chang eyou back to repatha- did you have any problems.

## 2021-03-08 NOTE — Telephone Encounter (Signed)
Will discuss with patient and follow up

## 2021-03-09 ENCOUNTER — Telehealth: Payer: Self-pay

## 2021-03-09 LAB — HGB A1C W/O EAG: Hgb A1c MFr Bld: 6.2 % — ABNORMAL HIGH (ref 4.8–5.6)

## 2021-03-09 LAB — SPECIMEN STATUS REPORT

## 2021-03-09 NOTE — Telephone Encounter (Signed)
Per chart we do not prescribe her Repatha or Praluent. Looks like cardiology handles this eventhough it was filled by her PCP here last tine.  Will send Mychart message to patient to verify.Marland Kitchen

## 2021-03-16 ENCOUNTER — Other Ambulatory Visit: Payer: Self-pay | Admitting: Nurse Practitioner

## 2021-03-17 ENCOUNTER — Encounter: Payer: Self-pay | Admitting: Family Medicine

## 2021-03-17 ENCOUNTER — Ambulatory Visit: Payer: 59 | Admitting: Family Medicine

## 2021-03-17 VITALS — BP 124/74 | HR 74 | Temp 98.9°F | Ht 66.0 in | Wt 196.0 lb

## 2021-03-17 DIAGNOSIS — H6593 Unspecified nonsuppurative otitis media, bilateral: Secondary | ICD-10-CM | POA: Diagnosis not present

## 2021-03-17 DIAGNOSIS — J069 Acute upper respiratory infection, unspecified: Secondary | ICD-10-CM | POA: Diagnosis not present

## 2021-03-17 MED ORDER — FLUTICASONE PROPIONATE 50 MCG/ACT NA SUSP: 2.0000 | Freq: Every day | NASAL | 6 refills | Status: DC

## 2021-03-17 MED ORDER — AMOXICILLIN-POT CLAVULANATE 875-125 MG PO TABS: 1.0000 | ORAL_TABLET | Freq: Two times a day (BID) | ORAL | 0 refills | Status: AC

## 2021-03-17 NOTE — Progress Notes (Signed)
Subjective:  Patient ID: Crystal Wong, female    DOB: April 07, 1955, 66 y.o.   MRN: [id number]  Patient Care Team: Chevis Pretty, [place] as PCP - General (Family Medicine) Donato Heinz, MD as PCP - Cardiology (Cardiology) Lavonna Monarch, MD as Consulting Physician (Dermatology)   Chief Complaint:  Nasal Congestion (Earaches, bilateral/Post nasal drip/Dizziness/Sore throat/Started 1 week ago/Covid home test 03/16/2021, negative)   HPI: Crystal Wong is a 66 y.o. female presenting on 03/17/2021 for Nasal Congestion (Earaches, bilateral/Post nasal drip/Dizziness/Sore throat/Started 1 week ago/Covid home test 03/16/2021, negative)   Pt presents today with complaints of bilateral otalgia, congestion, postnasal drip, and sore throat. No fever, chills, weakness, confusion, fatigue, appetite changes, or abdominal pain. Home COVID test negative. She has been using Mucinex, humidifier, and increased fluids without relief of symptoms.     Relevant past medical, surgical, family, and social history reviewed and updated as indicated.  Allergies and medications reviewed and updated. Data reviewed: Chart in Epic.   Past Medical History:  Diagnosis Date   Anxiety    Asthma    Depression    GERD (gastroesophageal reflux disease)    Heart murmur    Hyperlipidemia    Insomnia     Past Surgical History:  Procedure Laterality Date   ANKLE FRACTURE SURGERY Left    Pins and screws   BREAST LUMPECTOMY Bilateral    left x 1, right x 2   CARPAL TUNNEL RELEASE Right    CARPAL TUNNEL RELEASE Left    COLONOSCOPY     KNEE ARTHROSCOPY Bilateral    PARTIAL HYSTERECTOMY      Social History   Socioeconomic History   Marital status: Married    Spouse name: Not on file   Number of children: 2   Years of education: Not on file   Highest education level: Not on file  Occupational History   Occupation: retired  Tobacco Use   Smoking status: Never   Smokeless tobacco: Never   Vaping Use   Vaping Use: Never used  Substance and Sexual Activity   Alcohol use: Not Currently   Drug use: No   Sexual activity: Not on file  Other Topics Concern   Not on file  Social History Narrative   Not on file   Social Determinants of Health   Financial Resource Strain: Not on file  Food Insecurity: Not on file  Transportation Needs: Not on file  Physical Activity: Not on file  Stress: Not on file  Social Connections: Not on file  Intimate Partner Violence: Not on file    Outpatient Encounter Medications as of 03/17/2021  Medication Sig   Alirocumab (PRALUENT) 150 MG/ML SOAJ Inject 150 mg into the skin every 14 (fourteen) days.   amoxicillin-clavulanate (AUGMENTIN) 875-125 MG tablet Take 1 tablet by mouth 2 (two) times daily for 10 days.   betamethasone dipropionate 0.05 % lotion Apply topically 2 (two) times daily.   citalopram (CELEXA) 40 MG tablet Take 1 tablet (40 mg total) by mouth daily.   dexlansoprazole (DEXILANT) 60 MG capsule Take 1 capsule (60 mg total) by mouth daily.   famotidine (PEPCID) 20 MG tablet Take 1 tablet (20 mg total) by mouth 2 (two) times daily.   fluticasone (FLONASE) 50 MCG/ACT nasal spray Place 2 sprays into both nostrils daily.   hydrocortisone (ANUSOL-HC) 25 MG suppository PLACE 1 SUPPOSITORY (25 MG TOTAL) RECTALLY 2 (TWO) TIMES DAILY. (Patient taking differently: Place 25 mg rectally as needed.)  LORATADINE-D 24HR 10-240 MG 24 hr tablet Take 1 tablet by mouth once daily   LORazepam (ATIVAN) 1 MG tablet Take 1 tablet (1 mg total) by mouth every 8 (eight) hours as needed.   rosuvastatin (CRESTOR) 40 MG tablet Take 1 tablet (40 mg total) by mouth daily.   VENTOLIN HFA 108 (90 Base) MCG/ACT inhaler INHALE 2 PUFFS BY MOUTH EVERY 6 HOURS AS NEEDED FOR WHEEZING   VITAMIN D, CHOLECALCIFEROL, PO Take 1 tablet by mouth daily.    [DISCONTINUED] doxycycline (VIBRAMYCIN) 50 MG capsule Take 50 mg by mouth daily.   [DISCONTINUED] fluticasone (FLONASE)  50 MCG/ACT nasal spray Place 2 sprays into both nostrils daily.   No facility-administered encounter medications on file as of 03/17/2021.    Allergies  Allergen Reactions   Sulfa Antibiotics Anaphylaxis   Sulfamethoxazole-Trimethoprim Swelling   Elemental Sulfur Other (See Comments)    Other reaction(s): Angioedema (ALLERGY/intolerance) Other reaction(s): Angioedema (ALLERGY/intolerance)    Review of Systems  Constitutional:  Negative for activity change, appetite change, chills, diaphoresis, fatigue, fever and unexpected weight change.  HENT:  Positive for congestion, ear pain, postnasal drip, sore throat and tinnitus. Negative for dental problem, drooling, ear discharge, facial swelling, hearing loss, mouth sores, nosebleeds, rhinorrhea, sinus pressure, sinus pain, sneezing, trouble swallowing and voice change.   Eyes: Negative.   Respiratory:  Negative for cough, chest tightness and shortness of breath.   Cardiovascular:  Negative for chest pain, palpitations and leg swelling.  Gastrointestinal:  Negative for abdominal pain, blood in stool, constipation, diarrhea, nausea and vomiting.  Endocrine: Negative.   Genitourinary:  Negative for dysuria, frequency and urgency.  Musculoskeletal:  Negative for arthralgias and myalgias.  Skin: Negative.   Allergic/Immunologic: Negative.   Neurological:  Negative for dizziness, tremors, seizures, syncope, facial asymmetry, speech difficulty, weakness, light-headedness, numbness and headaches.  Hematological: Negative.   Psychiatric/Behavioral:  Negative for confusion, hallucinations, sleep disturbance and suicidal ideas.   All other systems reviewed and are negative.      Objective:  BP 124/74    Pulse 74    Temp 98.9 F (37.2 C)    Ht _0  (1.676 m)    Wt 196 lb (88.9 kg)    SpO2 97%    BMI 31.64 kg/m    Wt Readings from Last 3 Encounters:  03/17/21 196 lb (88.9 kg)  03/03/21 196 lb 9.6 oz (89.2 kg)  12/14/20 193 lb (87.5 kg)     Physical Exam Vitals and nursing note reviewed.  Constitutional:      General: She is not in acute distress.    Appearance: Normal appearance. She is well-developed and well-groomed. She is obese. She is not ill-appearing, toxic-appearing or diaphoretic.  HENT:     Head: Normocephalic and atraumatic.     Jaw: There is normal jaw occlusion.     Right Ear: Hearing, ear canal and external ear normal. A middle ear effusion is present. There is no impacted cerumen. Tympanic membrane is not erythematous.     Left Ear: Hearing, ear canal and external ear normal. A middle ear effusion is present. There is no impacted cerumen. Tympanic membrane is not erythematous.     Nose: Congestion present. No rhinorrhea.     Mouth/Throat:     Lips: Pink.     Mouth: Mucous membranes are moist.     Pharynx: Oropharynx is clear. Uvula midline. No pharyngeal swelling, oropharyngeal exudate, posterior oropharyngeal erythema or uvula swelling.     Tonsils: No tonsillar exudate or  tonsillar abscesses.  Eyes:     General: Lids are normal.     Extraocular Movements: Extraocular movements intact.     Conjunctiva/sclera: Conjunctivae normal.     Pupils: Pupils are equal, round, and reactive to light.  Neck:     Thyroid: No thyroid mass, thyromegaly or thyroid tenderness.     Vascular: No carotid bruit or JVD.     Trachea: Trachea and phonation normal.  Cardiovascular:     Rate and Rhythm: Normal rate and regular rhythm.     Chest Wall: PMI is not displaced.     Pulses: Normal pulses.     Heart sounds: Murmur heard.  Systolic murmur is present with a grade of 2/6.    No friction rub. No gallop.  Pulmonary:     Effort: Pulmonary effort is normal. No respiratory distress.     Breath sounds: Normal breath sounds. No wheezing or rhonchi.  Abdominal:     General: Bowel sounds are normal. There is no distension or abdominal bruit.     Palpations: Abdomen is soft. There is no hepatomegaly or splenomegaly.      Tenderness: There is no abdominal tenderness. There is no right CVA tenderness or left CVA tenderness.     Hernia: No hernia is present.  Musculoskeletal:     Cervical back: Normal range of motion and neck supple.     Right lower leg: No edema.     Left lower leg: No edema.  Lymphadenopathy:     Cervical: No cervical adenopathy.  Skin:    General: Skin is warm and dry.     Capillary Refill: Capillary refill takes less than 2 seconds.     Coloration: Skin is not cyanotic, jaundiced or pale.     Findings: No rash.  Neurological:     General: No focal deficit present.     Mental Status: She is alert and oriented to person, place, and time.     Sensory: Sensation is intact.     Motor: Motor function is intact.     Coordination: Coordination is intact.     Gait: Gait is intact.     Deep Tendon Reflexes: Reflexes are normal and symmetric.  Psychiatric:        Attention and Perception: Attention and perception normal.        Mood and Affect: Mood and affect normal.        Speech: Speech normal.        Behavior: Behavior normal. Behavior is cooperative.        Thought Content: Thought content normal.        Cognition and Memory: Cognition and memory normal.        Judgment: Judgment normal.    Results for orders placed or performed in visit on 03/03/21  CBC with Differential/Platelet  Result Value Ref Range   WBC 5.7 3.4 - 10.8 x10E3/uL   RBC 4.72 3.77 - 5.28 x10E6/uL   Hemoglobin 13.7 11.1 - 15.9 g/dL   Hematocrit 41.1 34.0 - 46.6 %   MCV 87 79 - 97 fL   MCH 29.0 26.6 - 33.0 pg   MCHC 33.3 31.5 - 35.7 g/dL   RDW 13.2 11.7 - 15.4 %   Platelets 239 150 - 450 x10E3/uL   Neutrophils 58 Not Estab. %   Lymphs 31 Not Estab. %   Monocytes 7 Not Estab. %   Eos 3 Not Estab. %   Basos 1 Not Estab. %   Neutrophils Absolute  3.2 1.4 - 7.0 x10E3/uL   Lymphocytes Absolute 1.8 0.7 - 3.1 x10E3/uL   Monocytes Absolute 0.4 0.1 - 0.9 x10E3/uL   EOS (ABSOLUTE) 0.2 0.0 - 0.4 x10E3/uL    Basophils Absolute 0.1 0.0 - 0.2 x10E3/uL   Immature Granulocytes 0 Not Estab. %   Immature Grans (Abs) 0.0 0.0 - 0.1 x10E3/uL  CMP14+EGFR  Result Value Ref Range   Glucose 126 (H) 70 - 99 mg/dL   BUN 20 8 - 27 mg/dL   Creatinine, Ser 0.79 0.57 - 1.00 mg/dL   eGFR 84 >59 mL/min/1.73   BUN/Creatinine Ratio 25 12 - 28   Sodium 140 134 - 144 mmol/L   Potassium 4.5 3.5 - 5.2 mmol/L   Chloride 102 96 - 106 mmol/L   CO2 25 20 - 29 mmol/L   Calcium 9.8 8.7 - 10.3 mg/dL   Total Protein 6.5 6.0 - 8.5 g/dL   Albumin 4.5 3.8 - 4.8 g/dL   Globulin, Total 2.0 1.5 - 4.5 g/dL   Albumin/Globulin Ratio 2.3 (H) 1.2 - 2.2   Bilirubin Total 0.3 0.0 - 1.2 mg/dL   Alkaline Phosphatase 77 44 - 121 IU/L   AST 19 0 - 40 IU/L   ALT 22 0 - 32 IU/L  Lipid panel  Result Value Ref Range   Cholesterol, Total 237 (H) 100 - 199 mg/dL   Triglycerides 81 0 - 149 mg/dL   HDL 66 >39 mg/dL   VLDL Cholesterol Cal 14 5 - 40 mg/dL   LDL Chol Calc (NIH) 157 (H) 0 - 99 mg/dL   Chol/HDL Ratio 3.6 0.0 - 4.4 ratio  Hgb A1c w/o eAG  Result Value Ref Range   Hgb A1c MFr Bld 6.2 (H) 4.8 - 5.6 %  Specimen status report  Result Value Ref Range   specimen status report Comment        Pertinent labs & imaging results that were available during my care of the patient were reviewed by me and considered in my medical decision making.  Assessment & Plan:  Katty was seen today for nasal congestion.  Diagnoses and all orders for this visit:  Bilateral otitis media with effusion No indications of AOM. Symptomatic care for effusion discussed in detail. Flonase as prescribed. Pocket script for Augmentin provided to pt, pt to start if symptoms last greater than 10 days or if fever develops. Report any new, worsening, or persistent symptoms.  -     fluticasone (FLONASE) 50 MCG/ACT nasal spray; Place 2 sprays into both nostrils daily. -     amoxicillin-clavulanate (AUGMENTIN) 875-125 MG tablet; Take 1 tablet by mouth 2 (two) times  daily for 10 days.  Viral URI with cough No indications of acute bacterial infection. Continue symptomatic care at home. Flonase as prescribed. Report new, worsening, or persistent symptoms.  -     fluticasone (FLONASE) 50 MCG/ACT nasal spray; Place 2 sprays into both nostrils daily.     Continue all other maintenance medications.  Follow up plan: Return if symptoms worsen or fail to improve.   Continue healthy lifestyle choices, including diet (rich in fruits, vegetables, and lean proteins, and low in salt and simple carbohydrates) and exercise (at least 30 minutes of moderate physical activity daily).  Educational handout given for otitis with effusion   The above assessment and management plan was discussed with the patient. The patient verbalized understanding of and has agreed to the management plan. Patient is aware to call the clinic if they develop any  new symptoms or if symptoms persist or worsen. Patient is aware when to return to the clinic for a follow-up visit. Patient educated on when it is appropriate to go to the emergency department.   Monia Pouch, FNP-C [facility] [phone]

## 2021-03-20 ENCOUNTER — Other Ambulatory Visit: Payer: Self-pay | Admitting: Nurse Practitioner

## 2021-03-20 ENCOUNTER — Other Ambulatory Visit: Payer: Self-pay | Admitting: Gastroenterology

## 2021-03-20 DIAGNOSIS — J4521 Mild intermittent asthma with (acute) exacerbation: Secondary | ICD-10-CM

## 2021-03-21 ENCOUNTER — Other Ambulatory Visit: Payer: Self-pay | Admitting: Nurse Practitioner

## 2021-03-21 ENCOUNTER — Telehealth: Payer: Self-pay | Admitting: Cardiology

## 2021-03-21 DIAGNOSIS — J4521 Mild intermittent asthma with (acute) exacerbation: Secondary | ICD-10-CM

## 2021-03-21 DIAGNOSIS — Z1211 Encounter for screening for malignant neoplasm of colon: Secondary | ICD-10-CM | POA: Diagnosis not present

## 2021-03-21 NOTE — Telephone Encounter (Signed)
.  Pt c/o medication issue:  1. Name of Medication: Alirocumab (PRALUENT) 150 MG/ML SOAJ   2. How are you currently taking this medication (dosage and times per day)? Inject 150 mg into the skin every 14 (fourteen) days.  3. Are you having a reaction (difficulty breathing--STAT)? no  4. What is your medication issue? Pt states insurance is requiring a pa for this medication... she is also requesting for another $25 off coupon to assist with cost after pa.. please advise

## 2021-03-21 NOTE — Telephone Encounter (Signed)
Called and spoke w/pt and stated that we will do a prior authorization.pt voiced understanding and gratitude

## 2021-03-21 NOTE — Telephone Encounter (Signed)
Pharmacy comment: Alternative Requested:VENTOLIN NOT COVERED BY INSURANCE.

## 2021-03-23 ENCOUNTER — Other Ambulatory Visit: Payer: Self-pay | Admitting: Family Medicine

## 2021-03-23 DIAGNOSIS — Z8619 Personal history of other infectious and parasitic diseases: Secondary | ICD-10-CM

## 2021-03-23 MED ORDER — FLUCONAZOLE 150 MG PO TABS: 150.0000 mg | ORAL_TABLET | Freq: Once | ORAL | 0 refills | Status: AC

## 2021-03-23 NOTE — Progress Notes (Signed)
Vovag

## 2021-03-24 ENCOUNTER — Other Ambulatory Visit: Payer: Self-pay | Admitting: Nurse Practitioner

## 2021-03-24 MED ORDER — FLUCONAZOLE 150 MG PO TABS: ORAL_TABLET | ORAL | 0 refills | Status: DC

## 2021-03-25 LAB — COLOGUARD: COLOGUARD: NEGATIVE

## 2021-03-28 ENCOUNTER — Telehealth: Payer: Self-pay | Admitting: Gastroenterology

## 2021-03-28 DIAGNOSIS — K2970 Gastritis, unspecified, without bleeding: Secondary | ICD-10-CM

## 2021-03-28 DIAGNOSIS — K2100 Gastro-esophageal reflux disease with esophagitis, without bleeding: Secondary | ICD-10-CM

## 2021-03-28 NOTE — Telephone Encounter (Signed)
Please call patient and see if she is wanting to switch back to dexilant? If so will need to order and do prior auth

## 2021-03-28 NOTE — Telephone Encounter (Signed)
Patient called states her insurance has not changed for this year however they are not wanting to continue to cover her Dexilant. Patient is requesting we contact her insurance to help her with this process.

## 2021-03-28 NOTE — Telephone Encounter (Signed)
LOV 12/24/19:  PLAN: - Continue Dexilant to 60 mg daily, continue famotidine 20 mg twice daily (90 day refill with 3 months) - Reviewed lifestyle modifications including working to maintain a health weight - Continue daily Miralax - Follow-up in 6 months, earlier if needed  From 06/23/20 encounter:  May substitute each dose of Dexilant with Protonix 40 mg QID (or other PPI as preferred by formulary). Thanks.   Review of Medication list indicates Dexilant was refilled by PCP.   Confused about what she is to be taking. In addition, pt has not scheduled f/u as indicated by Dr. Tarri Glenn POT. Routing this message to Dr. Tarri Glenn to determine if appt is required in order for complete PA IF pt is to be taking Dexilant. OR if PA should be completed by PCP whom last filled Rx.

## 2021-03-29 NOTE — Telephone Encounter (Signed)
Dexilant was sent in and last year her insurance paid for it but this year they are requiring a PA-pt would like [place] to try and get the PA for Dexilant.

## 2021-03-30 NOTE — Telephone Encounter (Signed)
Key: [id number] - PA Case ID: [id number] Need help? Call us at [phone] Status Sent to [place] 60MG  dr capsules

## 2021-04-01 NOTE — Telephone Encounter (Signed)
Your PA request has been closed. [id number]

## 2021-04-11 ENCOUNTER — Ambulatory Visit (INDEPENDENT_AMBULATORY_CARE_PROVIDER_SITE_OTHER): Payer: 59 | Admitting: Nurse Practitioner

## 2021-04-11 ENCOUNTER — Encounter: Payer: Self-pay | Admitting: Nurse Practitioner

## 2021-04-11 VITALS — BP 143/79 | HR 75 | Temp 97.8°F | Resp 20 | Ht 66.0 in | Wt 195.0 lb

## 2021-04-11 DIAGNOSIS — L309 Dermatitis, unspecified: Secondary | ICD-10-CM | POA: Diagnosis not present

## 2021-04-11 MED ORDER — CLOTRIMAZOLE-BETAMETHASONE 1-0.05 % EX CREA: 1.0000 "application " | TOPICAL_CREAM | Freq: Every day | CUTANEOUS | 0 refills | Status: DC

## 2021-04-11 NOTE — Progress Notes (Signed)
° °  Subjective:    Patient ID: Crystal Wong, female    DOB: [birth year], 66 y.o.   MRN: [id number]  Chief Complaint: Rash on stomach   HPI Patient comes in today c/o rash that developed last Wednesday. Burns but does not itch. Gets red through the day and has gotten bigger.    Review of Systems  Constitutional:  Negative for diaphoresis.  Eyes:  Negative for pain.  Respiratory:  Negative for shortness of breath.   Cardiovascular:  Negative for chest pain, palpitations and leg swelling.  Gastrointestinal:  Negative for abdominal pain.  Endocrine: Negative for polydipsia.  Skin:  Negative for rash.  Neurological:  Negative for dizziness, weakness and headaches.  Hematological:  Does not bruise/bleed easily.  All other systems reviewed and are negative.     Objective:   Physical Exam Constitutional:      Appearance: Normal appearance.  Skin:    Comments: 4cm mostly annular erythematous lesion on mid abdominal wall.  Neurological:     General: No focal deficit present.     Mental Status: She is alert and oriented to person, place, and time.  Psychiatric:        Mood and Affect: Mood normal.        Behavior: Behavior normal.   BP (!) 143/79    Pulse 75    Temp 97.8 F (36.6 C) (Temporal)    Resp 20    Ht 5\' 6"  (1.676 m)    Wt 195 lb (88.5 kg)    SpO2 98%    BMI 31.47 kg/m          Assessment & Plan:   Crystal Wong in today with chief complaint of Rash on stomach   1. Dermatitis Avoid scratching Good handwashing RTO prn   The above assessment and management plan was discussed with the patient. The patient verbalized understanding of and has agreed to the management plan. Patient is aware to call the clinic if symptoms persist or worsen. Patient is aware when to return to the clinic for a follow-up visit. Patient educated on when it is appropriate to go to the emergency department.   [name] Done, FNP

## 2021-04-13 ENCOUNTER — Other Ambulatory Visit: Payer: Self-pay | Admitting: Nurse Practitioner

## 2021-04-13 MED ORDER — PREDNISONE 20 MG PO TABS: 40.0000 mg | ORAL_TABLET | Freq: Every day | ORAL | 0 refills | Status: AC

## 2021-04-20 NOTE — Telephone Encounter (Signed)
Pt calling about about this PA. Spouse went to pick up rx and it was not covered. It was $126 please call back

## 2021-04-25 ENCOUNTER — Telehealth: Payer: 59 | Admitting: Physician Assistant

## 2021-04-25 DIAGNOSIS — R0781 Pleurodynia: Secondary | ICD-10-CM

## 2021-04-25 DIAGNOSIS — J069 Acute upper respiratory infection, unspecified: Secondary | ICD-10-CM

## 2021-04-25 NOTE — Progress Notes (Signed)
Based on what you shared with me, I feel your condition warrants further evaluation and I recommend that you be seen in a face to face visit.   Giving pain in ribs and lungs with coughing, you need to be evaluated in person for full COVID and flu testing and for examination to rule out concern for pneumonia. This is so we can make sure you get the most appropriate treatment and nothing is missed.    NOTE: There will be NO CHARGE for this eVisit   If you are having a true medical emergency please call 911.      For an urgent face to face visit, [facility] has six urgent care centers for your convenience:     [facility] [place] [facility] [place] Get Driving Directions [phone] [place], [place] [address]    [facility] [place] [facility]) Get Driving Directions [phone] [place], [place] [address]  [facility] [place] [facility]) Get Driving Directions [phone] [address] [employer] [place],  [place]  [address]  [facility] Get Driving Directions [phone] [place], [place], [place] [address]   [facility] Get Driving Directions  [phone] [address] [place], [place] [address]   [facility] [place] Get Driving Directions [phone] [address] [place], [place] [address]  Your MyChart E-visit questionnaire answers were reviewed by a board certified advanced clinical practitioner to complete your personal care plan based on your specific symptoms.  Thank you for using e-Visits.

## 2021-04-26 ENCOUNTER — Ambulatory Visit (INDEPENDENT_AMBULATORY_CARE_PROVIDER_SITE_OTHER): Payer: 59 | Admitting: Family Medicine

## 2021-04-26 ENCOUNTER — Encounter: Payer: Self-pay | Admitting: Family Medicine

## 2021-04-26 VITALS — BP 134/72 | HR 74 | Temp 97.7°F | Ht 66.0 in | Wt 194.0 lb

## 2021-04-26 DIAGNOSIS — R509 Fever, unspecified: Secondary | ICD-10-CM | POA: Diagnosis not present

## 2021-04-26 DIAGNOSIS — Z8742 Personal history of other diseases of the female genital tract: Secondary | ICD-10-CM | POA: Diagnosis not present

## 2021-04-26 DIAGNOSIS — R059 Cough, unspecified: Secondary | ICD-10-CM

## 2021-04-26 DIAGNOSIS — J189 Pneumonia, unspecified organism: Secondary | ICD-10-CM | POA: Diagnosis not present

## 2021-04-26 MED ORDER — AZITHROMYCIN 250 MG PO TABS: ORAL_TABLET | ORAL | 0 refills | Status: DC

## 2021-04-26 MED ORDER — BENZONATATE 100 MG PO CAPS
100.0000 mg | ORAL_CAPSULE | Freq: Three times a day (TID) | ORAL | 0 refills | Status: DC | PRN
Start: 2021-04-26 — End: 2021-06-07

## 2021-04-26 MED ORDER — AMOXICILLIN-POT CLAVULANATE 875-125 MG PO TABS: 1.0000 | ORAL_TABLET | Freq: Two times a day (BID) | ORAL | 0 refills | Status: DC

## 2021-04-26 MED ORDER — FLUCONAZOLE 150 MG PO TABS
150.0000 mg | ORAL_TABLET | Freq: Once | ORAL | 1 refills | Status: AC
Start: 2021-04-26 — End: 2021-04-26

## 2021-04-26 NOTE — Progress Notes (Signed)
Subjective:  Patient ID: Crystal Wong, female    DOB: 08-25-1955, 66 y.o.   MRN: [id number]  Patient Care Team: Chevis Pretty, FNP as PCP - General (Family Medicine) Donato Heinz, MD as PCP - Cardiology (Cardiology) Lavonna Monarch, MD as Consulting Physician (Dermatology)   Chief Complaint:  Cough   HPI: Crystal Wong is a 66 y.o. female presenting on 04/26/2021 for Cough   Pt presents today with complaints of worsening cough, congestion, sputum production, fever, chills, and malaise. COVID testing negative times 2 at home. Onset of symptoms over 7 days ago.   Cough This is a recurrent problem. The current episode started 1 to 4 weeks ago. The problem has been gradually worsening. The problem occurs every few minutes. The cough is Productive of brown sputum and productive of sputum. Associated symptoms include chest pain (with cough), chills, a fever, shortness of breath and wheezing. Pertinent negatives include no ear congestion, ear pain, headaches, heartburn, hemoptysis, myalgias, nasal congestion, postnasal drip, rash, rhinorrhea, sore throat, sweats or weight loss. She has tried a beta-agonist inhaler and OTC cough suppressant for the symptoms. The treatment provided no relief.    Relevant past medical, surgical, family, and social history reviewed and updated as indicated.  Allergies and medications reviewed and updated. Data reviewed: Chart in Epic.   Past Medical History:  Diagnosis Date   Anxiety    Asthma    Depression    GERD (gastroesophageal reflux disease)    Heart murmur    Hyperlipidemia    Insomnia     Past Surgical History:  Procedure Laterality Date   ANKLE FRACTURE SURGERY Left    Pins and screws   BREAST LUMPECTOMY Bilateral    left x 1, right x 2   CARPAL TUNNEL RELEASE Right    CARPAL TUNNEL RELEASE Left    COLONOSCOPY     KNEE ARTHROSCOPY Bilateral    PARTIAL HYSTERECTOMY      Social History   Socioeconomic History    Marital status: Married    Spouse name: Not on file   Number of children: 2   Years of education: Not on file   Highest education level: Not on file  Occupational History   Occupation: retired  Tobacco Use   Smoking status: Never   Smokeless tobacco: Never  Vaping Use   Vaping Use: Never used  Substance and Sexual Activity   Alcohol use: Not Currently   Drug use: No   Sexual activity: Not on file  Other Topics Concern   Not on file  Social History Narrative   Not on file   Social Determinants of Health   Financial Resource Strain: Not on file  Food Insecurity: Not on file  Transportation Needs: Not on file  Physical Activity: Not on file  Stress: Not on file  Social Connections: Not on file  Intimate Partner Violence: Not on file    Outpatient Encounter Medications as of 04/26/2021  Medication Sig   albuterol (VENTOLIN HFA) 108 (90 Base) MCG/ACT inhaler 2 puff q6 prn   Alirocumab (PRALUENT) 150 MG/ML SOAJ Inject 150 mg into the skin every 14 (fourteen) days.   amoxicillin-clavulanate (AUGMENTIN) 875-125 MG tablet Take 1 tablet by mouth 2 (two) times daily for 7 days.   azithromycin (ZITHROMAX Z-PAK) 250 MG tablet As directed   benzonatate (TESSALON PERLES) 100 MG capsule Take 1 capsule (100 mg total) by mouth 3 (three) times daily as needed for cough.   betamethasone  dipropionate 0.05 % lotion Apply topically 2 (two) times daily.   citalopram (CELEXA) 40 MG tablet Take 1 tablet (40 mg total) by mouth daily.   clotrimazole-betamethasone (LOTRISONE) cream Apply 1 application topically daily.   dexlansoprazole (DEXILANT) 60 MG capsule Take 1 capsule (60 mg total) by mouth daily.   famotidine (PEPCID) 20 MG tablet Take 1 tablet (20 mg total) by mouth 2 (two) times daily.   fluconazole (DIFLUCAN) 150 MG tablet Take 1 tablet (150 mg total) by mouth once for 1 dose.   fluticasone (FLONASE) 50 MCG/ACT nasal spray Place 2 sprays into both nostrils daily.   hydrocortisone  (ANUSOL-HC) 25 MG suppository PLACE 1 SUPPOSITORY (25 MG TOTAL) RECTALLY 2 (TWO) TIMES DAILY. (Patient taking differently: Place 25 mg rectally as needed.)   LORATADINE-D 24HR 10-240 MG 24 hr tablet Take 1 tablet by mouth once daily   LORazepam (ATIVAN) 1 MG tablet Take 1 tablet (1 mg total) by mouth every 8 (eight) hours as needed.   rosuvastatin (CRESTOR) 40 MG tablet Take 1 tablet (40 mg total) by mouth daily.   VITAMIN D, CHOLECALCIFEROL, PO Take 1 tablet by mouth daily.    [DISCONTINUED] doxycycline (VIBRAMYCIN) 50 MG capsule Take 50 mg by mouth daily.   [DISCONTINUED] fluconazole (DIFLUCAN) 150 MG tablet 1 po now and repeat in 1 week   No facility-administered encounter medications on file as of 04/26/2021.    Allergies  Allergen Reactions   Sulfa Antibiotics Anaphylaxis   Sulfamethoxazole-Trimethoprim Swelling   Elemental Sulfur Other (See Comments)    Other reaction(s): Angioedema (ALLERGY/intolerance) Other reaction(s): Angioedema (ALLERGY/intolerance)    Review of Systems  Constitutional:  Positive for activity change, appetite change, chills, fatigue and fever. Negative for diaphoresis, unexpected weight change and weight loss.  HENT:  Positive for congestion. Negative for ear pain, postnasal drip, rhinorrhea and sore throat.   Eyes: Negative.   Respiratory:  Positive for cough, shortness of breath and wheezing. Negative for apnea, hemoptysis, choking, chest tightness and stridor.   Cardiovascular:  Positive for chest pain (with cough). Negative for palpitations and leg swelling.  Gastrointestinal:  Negative for abdominal pain, blood in stool, constipation, diarrhea, heartburn, nausea and vomiting.  Endocrine: Negative.   Genitourinary:  Negative for decreased urine volume, difficulty urinating, dysuria, frequency and urgency.  Musculoskeletal:  Negative for arthralgias and myalgias.  Skin: Negative.  Negative for rash.  Allergic/Immunologic: Negative.   Neurological:   Negative for dizziness, tremors, syncope, facial asymmetry, speech difficulty, weakness, light-headedness, numbness and headaches.  Hematological: Negative.   Psychiatric/Behavioral:  Negative for confusion, hallucinations, sleep disturbance and suicidal ideas.   All other systems reviewed and are negative.      Objective:  BP 134/72    Pulse 74    Temp 97.7 F (36.5 C) (Temporal)    Ht _0  (1.676 m)    Wt 194 lb (88 kg)    SpO2 94%    BMI 31.31 kg/m    Wt Readings from Last 3 Encounters:  04/26/21 194 lb (88 kg)  04/11/21 195 lb (88.5 kg)  03/17/21 196 lb (88.9 kg)    Physical Exam Vitals and nursing note reviewed.  Constitutional:      General: She is not in acute distress.    Appearance: She is obese. She is ill-appearing. She is not toxic-appearing or diaphoretic.  HENT:     Head: Normocephalic and atraumatic.     Right Ear: Tympanic membrane, ear canal and external ear normal.  Left Ear: Tympanic membrane, ear canal and external ear normal.     Nose: Nose normal.     Mouth/Throat:     Mouth: Mucous membranes are moist.     Pharynx: Oropharynx is clear.  Eyes:     Conjunctiva/sclera: Conjunctivae normal.     Pupils: Pupils are equal, round, and reactive to light.  Cardiovascular:     Rate and Rhythm: Normal rate and regular rhythm.     Heart sounds: Normal heart sounds.  Pulmonary:     Effort: Pulmonary effort is normal.     Breath sounds: Wheezing and rhonchi (RLL) present.  Musculoskeletal:     Right lower leg: No edema.     Left lower leg: No edema.  Skin:    General: Skin is warm and dry.     Capillary Refill: Capillary refill takes less than 2 seconds.  Neurological:     General: No focal deficit present.     Mental Status: She is alert and oriented to person, place, and time.  Psychiatric:        Mood and Affect: Mood normal.        Behavior: Behavior normal.        Thought Content: Thought content normal.        Judgment: Judgment normal.     Results for orders placed or performed in visit on 03/03/21  Cologuard  Result Value Ref Range   COLOGUARD Negative Negative  CBC with Differential/Platelet  Result Value Ref Range   WBC 5.7 3.4 - 10.8 x10E3/uL   RBC 4.72 3.77 - 5.28 x10E6/uL   Hemoglobin 13.7 11.1 - 15.9 g/dL   Hematocrit 41.1 34.0 - 46.6 %   MCV 87 79 - 97 fL   MCH 29.0 26.6 - 33.0 pg   MCHC 33.3 31.5 - 35.7 g/dL   RDW 13.2 11.7 - 15.4 %   Platelets 239 150 - 450 x10E3/uL   Neutrophils 58 Not Estab. %   Lymphs 31 Not Estab. %   Monocytes 7 Not Estab. %   Eos 3 Not Estab. %   Basos 1 Not Estab. %   Neutrophils Absolute 3.2 1.4 - 7.0 x10E3/uL   Lymphocytes Absolute 1.8 0.7 - 3.1 x10E3/uL   Monocytes Absolute 0.4 0.1 - 0.9 x10E3/uL   EOS (ABSOLUTE) 0.2 0.0 - 0.4 x10E3/uL   Basophils Absolute 0.1 0.0 - 0.2 x10E3/uL   Immature Granulocytes 0 Not Estab. %   Immature Grans (Abs) 0.0 0.0 - 0.1 x10E3/uL  CMP14+EGFR  Result Value Ref Range   Glucose 126 (H) 70 - 99 mg/dL   BUN 20 8 - 27 mg/dL   Creatinine, Ser 0.79 0.57 - 1.00 mg/dL   eGFR 84 >59 mL/min/1.73   BUN/Creatinine Ratio 25 12 - 28   Sodium 140 134 - 144 mmol/L   Potassium 4.5 3.5 - 5.2 mmol/L   Chloride 102 96 - 106 mmol/L   CO2 25 20 - 29 mmol/L   Calcium 9.8 8.7 - 10.3 mg/dL   Total Protein 6.5 6.0 - 8.5 g/dL   Albumin 4.5 3.8 - 4.8 g/dL   Globulin, Total 2.0 1.5 - 4.5 g/dL   Albumin/Globulin Ratio 2.3 (H) 1.2 - 2.2   Bilirubin Total 0.3 0.0 - 1.2 mg/dL   Alkaline Phosphatase 77 44 - 121 IU/L   AST 19 0 - 40 IU/L   ALT 22 0 - 32 IU/L  Lipid panel  Result Value Ref Range   Cholesterol, Total 237 (H) 100 -  199 mg/dL   Triglycerides 81 0 - 149 mg/dL   HDL 66 >39 mg/dL   VLDL Cholesterol Cal 14 5 - 40 mg/dL   LDL Chol Calc (NIH) 157 (H) 0 - 99 mg/dL   Chol/HDL Ratio 3.6 0.0 - 4.4 ratio  Hgb A1c w/o eAG  Result Value Ref Range   Hgb A1c MFr Bld 6.2 (H) 4.8 - 5.6 %  Specimen status report  Result Value Ref Range   specimen status  report Comment        Pertinent labs & imaging results that were available during my care of the patient were reviewed by me and considered in my medical decision making.  Assessment & Plan:  Crystal Wong was seen today for cough.  Diagnoses and all orders for this visit:  Cough in adult Fever and chills CAP RLL Classic presentation of RLL CAP. Fever, cough, chills, malaise, SHOB, rust colored sputum production, and negative COVID testing x 2. Worsening symptoms. Will initiate antibiotic therapy along with tessalon for cough. Continue Mucinex with plenty of water and Albuterol inhaler as needed for cough, wheezing, and shortness of breath. Red flags discussed in detail. Follow up in 2 weeks or sooner if warranted.  -     benzonatate (TESSALON PERLES) 100 MG capsule; Take 1 capsule (100 mg total) by mouth 3 (three) times daily as needed for cough. -     amoxicillin-clavulanate (AUGMENTIN) 875-125 MG tablet; Take 1 tablet by mouth 2 (two) times daily for 7 days. -     azithromycin (ZITHROMAX Z-PAK) 250 MG tablet; As directed  History of vaginitis Reports vaginal candidiasis with antibiotic use, will provide below. Pt educated on when to take.  -     fluconazole (DIFLUCAN) 150 MG tablet; Take 1 tablet (150 mg total) by mouth once for 1 dose.     Continue all other maintenance medications.  Follow up plan: Return in about 2 weeks (around 05/10/2021), or if symptoms worsen or fail to improve.   Continue healthy lifestyle choices, including diet (rich in fruits, vegetables, and lean proteins, and low in salt and simple carbohydrates) and exercise (at least 30 minutes of moderate physical activity daily).  Educational handout given for CAP  The above assessment and management plan was discussed with the patient. The patient verbalized understanding of and has agreed to the management plan. Patient is aware to call the clinic if they develop any new symptoms or if symptoms persist or worsen. Patient  is aware when to return to the clinic for a follow-up visit. Patient educated on when it is appropriate to go to the emergency department.   Monia Pouch, FNP-C [facility] [phone]

## 2021-04-28 ENCOUNTER — Other Ambulatory Visit: Payer: Self-pay | Admitting: Nurse Practitioner

## 2021-04-28 DIAGNOSIS — F3342 Major depressive disorder, recurrent, in full remission: Secondary | ICD-10-CM

## 2021-05-03 ENCOUNTER — Ambulatory Visit (INDEPENDENT_AMBULATORY_CARE_PROVIDER_SITE_OTHER): Payer: 59

## 2021-05-03 ENCOUNTER — Ambulatory Visit: Payer: 59 | Admitting: Family Medicine

## 2021-05-03 ENCOUNTER — Encounter: Payer: Self-pay | Admitting: Family Medicine

## 2021-05-03 VITALS — BP 123/75 | HR 71 | Temp 98.1°F | Resp 20 | Ht 66.0 in | Wt 195.0 lb

## 2021-05-03 DIAGNOSIS — J189 Pneumonia, unspecified organism: Secondary | ICD-10-CM

## 2021-05-03 DIAGNOSIS — R059 Cough, unspecified: Secondary | ICD-10-CM

## 2021-05-03 MED ORDER — PREDNISONE 20 MG PO TABS: ORAL_TABLET | ORAL | 0 refills | Status: DC

## 2021-05-03 NOTE — Progress Notes (Signed)
Subjective:  Patient ID: Crystal Wong, female    DOB: Sep 24, 1955, 66 y.o.   MRN: [id number]  Patient Care Team: Chevis Pretty, FNP as PCP - General (Family Medicine) Donato Heinz, MD as PCP - Cardiology (Cardiology) Lavonna Monarch, MD as Consulting Physician (Dermatology)   Chief Complaint:  Cough and Fatigue (Sleeping a lot)   HPI: Crystal Wong is a 66 y.o. female presenting on 05/03/2021 for Cough and Fatigue (Sleeping a lot)   Patient presents today for patient has completed azithromycin and Augmentin.  She reports she still feels fatigued.  Has a cough with clear sputum production.  Has some wheezing and shortness of breath at times.  No fever, chills, confusion, chest pain, dizziness, palpitations, decreased urine output, or syncope.  She has been taking Mucinex 600 mg twice daily.  Aware she can increase to 2 tablets twice daily with plenty of fluids.  Cough This is a recurrent problem. The current episode started 1 to 4 weeks ago. The problem has been gradually improving. The cough is Productive of sputum. Associated symptoms include wheezing. Pertinent negatives include no chest pain, chills, ear congestion, ear pain, fever, headaches, heartburn, hemoptysis, myalgias, nasal congestion, postnasal drip, rash, rhinorrhea, sore throat, shortness of breath, sweats or weight loss.    Relevant past medical, surgical, family, and social history reviewed and updated as indicated.  Allergies and medications reviewed and updated. Data reviewed: Chart in Epic.   Past Medical History:  Diagnosis Date   Anxiety    Asthma    Depression    GERD (gastroesophageal reflux disease)    Heart murmur    Hyperlipidemia    Insomnia     Past Surgical History:  Procedure Laterality Date   ANKLE FRACTURE SURGERY Left    Pins and screws   BREAST LUMPECTOMY Bilateral    left x 1, right x 2   CARPAL TUNNEL RELEASE Right    CARPAL TUNNEL RELEASE Left    COLONOSCOPY      KNEE ARTHROSCOPY Bilateral    PARTIAL HYSTERECTOMY      Social History   Socioeconomic History   Marital status: Married    Spouse name: Not on file   Number of children: 2   Years of education: Not on file   Highest education level: Not on file  Occupational History   Occupation: retired  Tobacco Use   Smoking status: Never   Smokeless tobacco: Never  Vaping Use   Vaping Use: Never used  Substance and Sexual Activity   Alcohol use: Not Currently   Drug use: No   Sexual activity: Not on file  Other Topics Concern   Not on file  Social History Narrative   Not on file   Social Determinants of Health   Financial Resource Strain: Not on file  Food Insecurity: Not on file  Transportation Needs: Not on file  Physical Activity: Not on file  Stress: Not on file  Social Connections: Not on file  Intimate Partner Violence: Not on file    Outpatient Encounter Medications as of 05/03/2021  Medication Sig   albuterol (VENTOLIN HFA) 108 (90 Base) MCG/ACT inhaler 2 puff q6 prn   Alirocumab (PRALUENT) 150 MG/ML SOAJ Inject 150 mg into the skin every 14 (fourteen) days.   benzonatate (TESSALON PERLES) 100 MG capsule Take 1 capsule (100 mg total) by mouth 3 (three) times daily as needed for cough.   betamethasone dipropionate 0.05 % lotion Apply topically 2 (two) times  daily.   citalopram (CELEXA) 40 MG tablet Take 1 tablet (40 mg total) by mouth daily.   clotrimazole-betamethasone (LOTRISONE) cream Apply 1 application topically daily.   dexlansoprazole (DEXILANT) 60 MG capsule Take 1 capsule (60 mg total) by mouth daily.   famotidine (PEPCID) 20 MG tablet Take 1 tablet (20 mg total) by mouth 2 (two) times daily.   fluticasone (FLONASE) 50 MCG/ACT nasal spray Place 2 sprays into both nostrils daily.   hydrocortisone (ANUSOL-HC) 25 MG suppository PLACE 1 SUPPOSITORY (25 MG TOTAL) RECTALLY 2 (TWO) TIMES DAILY. (Patient taking differently: Place 25 mg rectally as needed.)   LORATADINE-D  24HR 10-240 MG 24 hr tablet Take 1 tablet by mouth once daily   LORazepam (ATIVAN) 1 MG tablet Take 1 tablet (1 mg total) by mouth every 8 (eight) hours as needed.   predniSONE (DELTASONE) 20 MG tablet 2 po at sametime daily for 5 days- start tomorrow   rosuvastatin (CRESTOR) 40 MG tablet Take 1 tablet (40 mg total) by mouth daily.   VITAMIN D, CHOLECALCIFEROL, PO Take 1 tablet by mouth daily.    [DISCONTINUED] amoxicillin-clavulanate (AUGMENTIN) 875-125 MG tablet Take 1 tablet by mouth 2 (two) times daily for 7 days.   [DISCONTINUED] azithromycin (ZITHROMAX Z-PAK) 250 MG tablet As directed   No facility-administered encounter medications on file as of 05/03/2021.    Allergies  Allergen Reactions   Sulfa Antibiotics Anaphylaxis   Sulfamethoxazole-Trimethoprim Swelling   Elemental Sulfur Other (See Comments)    Other reaction(s): Angioedema (ALLERGY/intolerance) Other reaction(s): Angioedema (ALLERGY/intolerance)    Review of Systems  Constitutional:  Positive for activity change and fatigue. Negative for appetite change, chills, diaphoresis, fever, unexpected weight change and weight loss.  HENT:  Negative for ear pain, postnasal drip, rhinorrhea and sore throat.   Respiratory:  Positive for cough, chest tightness and wheezing. Negative for apnea, hemoptysis, choking, shortness of breath and stridor.   Cardiovascular:  Negative for chest pain, palpitations and leg swelling.  Gastrointestinal:  Negative for abdominal pain and heartburn.  Genitourinary:  Negative for decreased urine volume and difficulty urinating.  Musculoskeletal:  Negative for myalgias.  Skin:  Negative for rash.  Neurological:  Negative for dizziness, tremors, seizures, syncope, facial asymmetry, speech difficulty, weakness, light-headedness, numbness and headaches.  Psychiatric/Behavioral:  Negative for confusion.   All other systems reviewed and are negative.      Objective:  BP 123/75    Pulse 71    Temp 98.1  F (36.7 C) (Temporal)    Resp 20    Ht 5' 6"  (1.676 m)    Wt 195 lb (88.5 kg)    SpO2 98%    BMI 31.47 kg/m    Wt Readings from Last 3 Encounters:  05/03/21 195 lb (88.5 kg)  04/26/21 194 lb (88 kg)  04/11/21 195 lb (88.5 kg)    Physical Exam Vitals and nursing note reviewed.  Constitutional:      Appearance: Normal appearance. She is obese.  HENT:     Head: Normocephalic and atraumatic.     Mouth/Throat:     Mouth: Mucous membranes are moist.  Eyes:     Conjunctiva/sclera: Conjunctivae normal.     Pupils: Pupils are equal, round, and reactive to light.  Cardiovascular:     Rate and Rhythm: Normal rate and regular rhythm.     Heart sounds: Normal heart sounds.  Pulmonary:     Effort: Pulmonary effort is normal. No respiratory distress.     Breath sounds: No stridor.  Wheezing (minimal expiratory, RLL) present. No rhonchi or rales.  Chest:     Chest wall: No tenderness.  Musculoskeletal:     Right lower leg: No edema.     Left lower leg: No edema.  Skin:    General: Skin is warm and dry.     Capillary Refill: Capillary refill takes less than 2 seconds.  Neurological:     General: No focal deficit present.     Mental Status: She is alert and oriented to person, place, and time.  Psychiatric:        Mood and Affect: Mood normal.        Behavior: Behavior normal.        Thought Content: Thought content normal.        Judgment: Judgment normal.    Results for orders placed or performed in visit on 03/03/21  Cologuard  Result Value Ref Range   COLOGUARD Negative Negative  CBC with Differential/Platelet  Result Value Ref Range   WBC 5.7 3.4 - 10.8 x10E3/uL   RBC 4.72 3.77 - 5.28 x10E6/uL   Hemoglobin 13.7 11.1 - 15.9 g/dL   Hematocrit 41.1 34.0 - 46.6 %   MCV 87 79 - 97 fL   MCH 29.0 26.6 - 33.0 pg   MCHC 33.3 31.5 - 35.7 g/dL   RDW 13.2 11.7 - 15.4 %   Platelets 239 150 - 450 x10E3/uL   Neutrophils 58 Not Estab. %   Lymphs 31 Not Estab. %   Monocytes 7 Not  Estab. %   Eos 3 Not Estab. %   Basos 1 Not Estab. %   Neutrophils Absolute 3.2 1.4 - 7.0 x10E3/uL   Lymphocytes Absolute 1.8 0.7 - 3.1 x10E3/uL   Monocytes Absolute 0.4 0.1 - 0.9 x10E3/uL   EOS (ABSOLUTE) 0.2 0.0 - 0.4 x10E3/uL   Basophils Absolute 0.1 0.0 - 0.2 x10E3/uL   Immature Granulocytes 0 Not Estab. %   Immature Grans (Abs) 0.0 0.0 - 0.1 x10E3/uL  CMP14+EGFR  Result Value Ref Range   Glucose 126 (H) 70 - 99 mg/dL   BUN 20 8 - 27 mg/dL   Creatinine, Ser 0.79 0.57 - 1.00 mg/dL   eGFR 84 >59 mL/min/1.73   BUN/Creatinine Ratio 25 12 - 28   Sodium 140 134 - 144 mmol/L   Potassium 4.5 3.5 - 5.2 mmol/L   Chloride 102 96 - 106 mmol/L   CO2 25 20 - 29 mmol/L   Calcium 9.8 8.7 - 10.3 mg/dL   Total Protein 6.5 6.0 - 8.5 g/dL   Albumin 4.5 3.8 - 4.8 g/dL   Globulin, Total 2.0 1.5 - 4.5 g/dL   Albumin/Globulin Ratio 2.3 (H) 1.2 - 2.2   Bilirubin Total 0.3 0.0 - 1.2 mg/dL   Alkaline Phosphatase 77 44 - 121 IU/L   AST 19 0 - 40 IU/L   ALT 22 0 - 32 IU/L  Lipid panel  Result Value Ref Range   Cholesterol, Total 237 (H) 100 - 199 mg/dL   Triglycerides 81 0 - 149 mg/dL   HDL 66 >39 mg/dL   VLDL Cholesterol Cal 14 5 - 40 mg/dL   LDL Chol Calc (NIH) 157 (H) 0 - 99 mg/dL   Chol/HDL Ratio 3.6 0.0 - 4.4 ratio  Hgb A1c w/o eAG  Result Value Ref Range   Hgb A1c MFr Bld 6.2 (H) 4.8 - 5.6 %  Specimen status report  Result Value Ref Range   specimen status report Comment  X-Ray: CXR: No acute findings. Preliminary x-ray reading by Monia Pouch, FNP-C, [facility].   Pertinent labs & imaging results that were available during my care of the patient were reviewed by me and considered in my medical decision making.  Assessment & Plan:  Wilena was seen today for cough and fatigue.  Diagnoses and all orders for this visit:  Cough in adult Community acquired pneumonia of right lower lobe of lung CXR in office today without acute findings when compared to last CXR. Will notify pt if  radiology reading differs. Pt aware to stay hydrated and get rest. Will add prednisone to regimen. Will check below labs. Pt aware to report any new, worsening, or persistent symptoms.  -     DG Chest 2 View; Future -     predniSONE (DELTASONE) 20 MG tablet; 2 po at sametime daily for 5 days- start tomorrow -     CBC with Differential/Platelet -     BMP8+EGFR     Continue all other maintenance medications.  Follow up plan: Return if symptoms worsen or fail to improve.   Continue healthy lifestyle choices, including diet (rich in fruits, vegetables, and lean proteins, and low in salt and simple carbohydrates) and exercise (at least 30 minutes of moderate physical activity daily).   The above assessment and management plan was discussed with the patient. The patient verbalized understanding of and has agreed to the management plan. Patient is aware to call the clinic if they develop any new symptoms or if symptoms persist or worsen. Patient is aware when to return to the clinic for a follow-up visit. Patient educated on when it is appropriate to go to the emergency department.   Monia Pouch, FNP-C [facility] [phone]

## 2021-05-04 LAB — CBC WITH DIFFERENTIAL/PLATELET
Basophils Absolute: 0.1 10*3/uL (ref 0.0–0.2)
Basos: 1 %
EOS (ABSOLUTE): 0.1 10*3/uL (ref 0.0–0.4)
Eos: 2 %
Hematocrit: 41.3 % (ref 34.0–46.6)
Hemoglobin: 13.9 g/dL (ref 11.1–15.9)
Immature Grans (Abs): 0 10*3/uL (ref 0.0–0.1)
Immature Granulocytes: 0 %
Lymphocytes Absolute: 2 10*3/uL (ref 0.7–3.1)
Lymphs: 30 %
MCH: 28.7 pg (ref 26.6–33.0)
MCHC: 33.7 g/dL (ref 31.5–35.7)
MCV: 85 fL (ref 79–97)
Monocytes Absolute: 0.3 10*3/uL (ref 0.1–0.9)
Monocytes: 5 %
Neutrophils Absolute: 3.9 10*3/uL (ref 1.4–7.0)
Neutrophils: 62 %
Platelets: 273 10*3/uL (ref 150–450)
RBC: 4.84 x10E6/uL (ref 3.77–5.28)
RDW: 13.3 % (ref 11.7–15.4)
WBC: 6.4 10*3/uL (ref 3.4–10.8)

## 2021-05-04 LAB — BMP8+EGFR
BUN/Creatinine Ratio: 15 (ref 12–28)
BUN: 13 mg/dL (ref 8–27)
CO2: 22 mmol/L (ref 20–29)
Calcium: 9.9 mg/dL (ref 8.7–10.3)
Chloride: 103 mmol/L (ref 96–106)
Creatinine, Ser: 0.86 mg/dL (ref 0.57–1.00)
Glucose: 103 mg/dL — ABNORMAL HIGH (ref 70–99)
Potassium: 4.2 mmol/L (ref 3.5–5.2)
Sodium: 140 mmol/L (ref 134–144)
eGFR: 76 mL/min/{1.73_m2} (ref >59)

## 2021-05-05 ENCOUNTER — Other Ambulatory Visit: Payer: Self-pay | Admitting: Family Medicine

## 2021-05-05 ENCOUNTER — Ambulatory Visit: Payer: 59 | Admitting: Family Medicine

## 2021-05-05 DIAGNOSIS — R9389 Abnormal findings on diagnostic imaging of other specified body structures: Secondary | ICD-10-CM

## 2021-05-05 DIAGNOSIS — R059 Cough, unspecified: Secondary | ICD-10-CM

## 2021-05-06 ENCOUNTER — Encounter: Payer: Self-pay | Admitting: Family Medicine

## 2021-05-08 ENCOUNTER — Other Ambulatory Visit: Payer: Self-pay | Admitting: Nurse Practitioner

## 2021-05-09 MED ORDER — NYSTATIN 100000 UNIT/ML MT SUSP
5.0000 mL | Freq: Four times a day (QID) | OROMUCOSAL | 0 refills | Status: DC
Start: 2021-05-09 — End: 2021-06-07

## 2021-05-09 NOTE — Telephone Encounter (Signed)
Nystatin sent to pharmacy

## 2021-05-09 NOTE — Telephone Encounter (Signed)
Last office visit 04/11/21 ?Last refill 04/11/21, 45 grams, no refills ?

## 2021-05-10 DIAGNOSIS — M1712 Unilateral primary osteoarthritis, left knee: Secondary | ICD-10-CM | POA: Diagnosis not present

## 2021-05-13 DIAGNOSIS — L661 Lichen planopilaris: Secondary | ICD-10-CM | POA: Diagnosis not present

## 2021-05-19 ENCOUNTER — Other Ambulatory Visit: Payer: Self-pay | Admitting: Nurse Practitioner

## 2021-05-25 ENCOUNTER — Other Ambulatory Visit: Payer: Self-pay | Admitting: Nurse Practitioner

## 2021-05-25 DIAGNOSIS — F3342 Major depressive disorder, recurrent, in full remission: Secondary | ICD-10-CM

## 2021-05-30 ENCOUNTER — Other Ambulatory Visit: Payer: Self-pay

## 2021-05-30 ENCOUNTER — Ambulatory Visit
Admission: RE | Admit: 2021-05-30 | Discharge: 2021-05-30 | Disposition: A | Discharge status: Home / Self Care | Payer: 59 | Source: Ambulatory Visit | Attending: Nurse Practitioner | Admitting: Nurse Practitioner

## 2021-05-30 DIAGNOSIS — Z1231 Encounter for screening mammogram for malignant neoplasm of breast: Secondary | ICD-10-CM

## 2021-06-02 DIAGNOSIS — M25552 Pain in left hip: Secondary | ICD-10-CM | POA: Diagnosis not present

## 2021-06-06 NOTE — Progress Notes (Signed)
?Cardiology Office Note:   ? ?Date:  06/07/2021  ? ?ID:  Crystal Wong, DOB 11-25-1955, MRN [id number] ? ?PCP:  Chevis Pretty, FNP  ?Cardiologist:  Donato Heinz, MD  ?Electrophysiologist:  None  ? ?Referring MD: Hassell Done, Mary-Margaret, *  ? ?No chief complaint on file. ? ? ?History of Present Illness:   ? ?Crystal Wong is a 66 y.o. female with a hx of anxiety, depression, asthma, GERD, hyperlipidemia who presents for follow-up.  She was referred by Dr. Tarri Glenn for pre-op evaluation, initially seen on 05/19/2019.  She was referred to gastroenterology for EGD, requested cardiac evaluation prior to procedure.  She reports that she has been having a fluttering feeling in her chest that started in January.  Was occurring every day, but has not had for the last week.  Can last for 20 minutes up to hours.  In addition to chest fluttering, she sometimes avoids episodes where she feels like her heart is racing, but states that this is related to caffeine use.  Has also been having burning in her chest that she has attributed to GERD.  Has improved with increasing her GERD medications.  Reports that she exercises regularly, walks 4 miles per day and does Pilates for 30 minutes.  Denies any exertional chest pain.  Does report some dyspnea with walking up hills.  Never smoked.  Mother had MI at 51.   Reports she was told years ago she had mitral valve prolapse after she was evaluated for heart murmur ? ?At initial clinic visit on 05/19/2019, no further work-up was recommended prior to her EGD, which she underwent on 06/09/2019.  TTE on 06/03/2019 showed LVEF 60 to 65%, normal RV function, mild aortic stenosis, mild to moderate aortic regurgitation.  Zio patch x14 days on 07/17/2019 showed no significant arrhythmias.  Calcium score on 06/03/2019 was 0.  LDL 205 on 08/19/2019 despite being on rosuvastatin 40 mg daily.  Referred to lipid clinic, started on [place].  Repeat lipid panel on 12/24/2019 showed marked improvement  in LDL to 75.  Reported worsening palpitations and in Zio patch x3 days on 05/06/2020 showed no significant arrhythmias.  Echocardiogram on 05/18/2020 showed normal biventricular function, mild AS, mild to moderate AI. ? ?Since last clinic visit, she reports that she has been doing well.  Did have episodes of palpitations about 3 months ago.  Intermittent lasted total of 45 minutes.  None since that time.  She denies any chest pain, lightheadedness, syncope, or lower extremity edema.  Reports had episode of pneumonia in January, took about a month to recover.  Denies any dyspnea currently.  She has not been exercising due to knee pain. ? ?Past Medical History:  ?Diagnosis Date  ? Anxiety   ? Asthma   ? Depression   ? GERD (gastroesophageal reflux disease)   ? Heart murmur   ? Hyperlipidemia   ? Insomnia   ? ? ?Past Surgical History:  ?Procedure Laterality Date  ? ANKLE FRACTURE SURGERY Left   ? Pins and screws  ? BREAST LUMPECTOMY Bilateral   ? left x 1, right x 2  ? CARPAL TUNNEL RELEASE Right   ? CARPAL TUNNEL RELEASE Left   ? COLONOSCOPY    ? KNEE ARTHROSCOPY Bilateral   ? PARTIAL HYSTERECTOMY    ? ? ?Current Medications: ?Current Meds  ?Medication Sig  ? albuterol (VENTOLIN HFA) 108 (90 Base) MCG/ACT inhaler 2 puff q6 prn  ? Alirocumab (PRALUENT) 150 MG/ML SOAJ Inject 150 mg into  the skin every 14 (fourteen) days.  ? ASPIRIN 81 PO Take 81 mg by mouth daily.  ? citalopram (CELEXA) 40 MG tablet TAKE 1 TABLET BY MOUTH EVERY DAY  ? dexlansoprazole (DEXILANT) 60 MG capsule Take 1 capsule (60 mg total) by mouth daily.  ? famotidine (PEPCID) 20 MG tablet Take 1 tablet (20 mg total) by mouth 2 (two) times daily.  ? fluticasone (FLONASE) 50 MCG/ACT nasal spray Place 2 sprays into both nostrils daily.  ? hydrocortisone (ANUSOL-HC) 25 MG suppository PLACE 1 SUPPOSITORY (25 MG TOTAL) RECTALLY 2 (TWO) TIMES DAILY. (Patient taking differently: Place 25 mg rectally as needed.)  ? LORATADINE-D 24HR 10-240 MG 24 hr tablet Take 1  tablet by mouth once daily  ? LORazepam (ATIVAN) 1 MG tablet Take 1 tablet (1 mg total) by mouth every 8 (eight) hours as needed.  ? melatonin 5 MG TABS Take 5 mg by mouth at bedtime.  ? rosuvastatin (CRESTOR) 40 MG tablet Take 1 tablet (40 mg total) by mouth daily.  ? VITAMIN D, CHOLECALCIFEROL, PO Take 1 tablet by mouth daily.   ?  ? ?Allergies:   Sulfa antibiotics, Sulfamethoxazole-trimethoprim, and Elemental sulfur  ? ?Social History  ? ?Socioeconomic History  ? Marital status: Married  ?  Spouse name: Not on file  ? Number of children: 2  ? Years of education: Not on file  ? Highest education level: Not on file  ?Occupational History  ? Occupation: retired  ?Tobacco Use  ? Smoking status: Never  ? Smokeless tobacco: Never  ?Vaping Use  ? Vaping Use: Never used  ?Substance and Sexual Activity  ? Alcohol use: Not Currently  ? Drug use: No  ? Sexual activity: Not on file  ?Other Topics Concern  ? Not on file  ?Social History Narrative  ? Not on file  ? ?Social Determinants of Health  ? ?Financial Resource Strain: Not on file  ?Food Insecurity: Not on file  ?Transportation Needs: Not on file  ?Physical Activity: Not on file  ?Stress: Not on file  ?Social Connections: Not on file  ?  ? ?Family History: ?The patient's family history includes Colon cancer (age of onset: 37) in her father; Colon polyps in her mother; Diabetes in her mother; Gallbladder disease in her daughter; Gallstones in her paternal grandmother; Heart disease in her mother. There is no history of Esophageal cancer, Stomach cancer, Rectal cancer, or Breast cancer. ? ?ROS:   ?Please see the history of present illness.    ? All other systems reviewed and are negative. ? ?EKGs/Labs/Other Studies Reviewed:   ? ?The following studies were reviewed today: ? ? ?EKG:   ?06/07/21: Normal sinus rhythm, rate 77, right bundle branch block, left anterior fascicular block, no ST abnormalities ?04/23/2020- The ekg ordered  demonstrates normal sinus rhythm, rate 79,  RBBB, no ST/T wave abnormalities ? ?Recent Labs: ?03/03/2021: ALT 22 ?05/03/2021: BUN 13; Creatinine, Ser 0.86; Hemoglobin 13.9; Platelets 273; Potassium 4.2; Sodium 140  ?Recent Lipid Panel ?   ?Component Value Date/Time  ? CHOL 237 (H) 03/03/2021 1018  ? CHOL 225 (H) 08/23/2012 1155  ? TRIG 81 03/03/2021 1018  ? TRIG 125 12/23/2013 1004  ? TRIG 51 08/23/2012 1155  ? HDL 66 03/03/2021 1018  ? HDL 58 12/23/2013 1004  ? HDL 59 08/23/2012 1155  ? CHOLHDL 3.6 03/03/2021 1018  ? LDLCALC 157 (H) 03/03/2021 1018  ? LDLCALC 163 (H) 12/23/2013 1004  ? LDLCALC 156 (H) 08/23/2012 1155  ? ? ?Physical  Exam:   ? ?VS:  BP 138/80   Pulse 77   Ht '5\' 6"'$  (1.676 m)   Wt 199 lb 3.2 oz (90.4 kg)   SpO2 98%   BMI 32.15 kg/m?    ? ?Wt Readings from Last 3 Encounters:  ?06/07/21 199 lb 3.2 oz (90.4 kg)  ?05/03/21 195 lb (88.5 kg)  ?04/26/21 194 lb (88 kg)  ?  ? ?GEN:  Well nourished, well developed in no acute distress ?HEENT: Normal ?NECK: No JVD; No carotid bruits ?LYMPHATICS: No lymphadenopathy ?CARDIAC: RRR, 2/6 systolic heart murmur ?RESPIRATORY:  Clear to auscultation without rales, wheezing or rhonchi  ?ABDOMEN: Soft, non-tender, non-distended ?MUSCULOSKELETAL:  No edema; No deformity  ?SKIN: Warm and dry ?NEUROLOGIC:  Alert and oriented x 3 ?PSYCHIATRIC:  Normal affect  ? ?ASSESSMENT:   ? ?1. Palpitations   ?2. Aortic valve stenosis, etiology of cardiac valve disease unspecified   ?3. Hyperlipidemia, unspecified hyperlipidemia type   ? ? ?PLAN:   ? ?Palpitations: Zio patch x14 days on 07/17/2019 showed no significant arrhythmias.  At clinic visit 04/23/2020, reports now having frequent palpitations, different in character than palpitation she was having before.  Zio patch x3 days on 05/06/2020 showed no significant arrhythmias.  Echocardiogram on 05/18/2020 showed normal biventricular function, mild AS, mild to moderate AI. ?-She is having intermittent palpitations, none in the last 3 months.  She has an Visual merchandiser, encouraged her  to use that to monitor intermittent palpitations ? ?Aortic stenosis/regurgitation:  TTE on 06/03/2019 showed LVEF 60 to 65%, normal RV function, mild aortic stenosis, mild to moderate aortic regurgitation.  E

## 2021-06-07 ENCOUNTER — Encounter: Payer: Self-pay | Admitting: Cardiology

## 2021-06-07 ENCOUNTER — Ambulatory Visit: Payer: 59 | Admitting: Cardiology

## 2021-06-07 VITALS — BP 138/80 | HR 77 | Ht 66.0 in | Wt 199.2 lb

## 2021-06-07 DIAGNOSIS — R002 Palpitations: Secondary | ICD-10-CM

## 2021-06-07 DIAGNOSIS — I350 Nonrheumatic aortic (valve) stenosis: Secondary | ICD-10-CM

## 2021-06-07 DIAGNOSIS — E785 Hyperlipidemia, unspecified: Secondary | ICD-10-CM

## 2021-06-07 LAB — LIPID PANEL
Chol/HDL Ratio: 2.3 ratio (ref 0.0–4.4)
Cholesterol, Total: 165 mg/dL (ref 100–199)
HDL: 71 mg/dL (ref >39)
LDL Chol Calc (NIH): 76 mg/dL (ref 0–99)
Triglycerides: 103 mg/dL (ref 0–149)
VLDL Cholesterol Cal: 18 mg/dL (ref 5–40)

## 2021-06-07 NOTE — Patient Instructions (Signed)
Medication Instructions:  ?Your physician recommends that you continue on your current medications as directed. Please refer to the Current Medication list given to you today. ? ?*If you need a refill on your cardiac medications before your next appointment, please call your pharmacy* ? ? ?Lab Work: ?Lipid today ? ?If you have labs (blood work) drawn today and your tests are completely normal, you will receive your results only by: ?MyChart Message (if you have MyChart) OR ?A paper copy in the mail ?If you have any lab test that is abnormal or we need to change your treatment, we will call you to review the results. ? ? ?Testing/Procedures: ?Your physician has requested that you have an echocardiogram in 1 YEAR. Echocardiography is a painless test that uses sound waves to create images of your heart. It provides your doctor with information about the size and shape of your heart and how well your heart?s chambers and valves are working. This procedure takes approximately one hour. There are no restrictions for this procedure. ? ?Follow-Up: ?At [facility], you and your health needs are our priority.  As part of our continuing mission to provide you with exceptional heart care, we have created designated Provider Care Teams.  These Care Teams include your primary Cardiologist (physician) and Advanced Practice Providers (APPs -  Physician Assistants and Nurse Practitioners) who all work together to provide you with the care you need, when you need it. ? ?We recommend signing up for the patient portal called "MyChart".  Sign up information is provided on this After Visit Summary.  MyChart is used to connect with patients for Virtual Visits (Telemedicine).  Patients are able to view lab/test results, encounter notes, upcoming appointments, etc.  Non-urgent messages can be sent to your provider as well.   ?To learn more about what you can do with MyChart, go to [email or web address].   ? ?Your next appointment:    ?12 month(s) ? ?The format for your next appointment:   ?In Person ? ?Provider:   ?Donato Heinz, MD { ? ? ?

## 2021-06-09 ENCOUNTER — Encounter: Payer: Self-pay | Admitting: Internal Medicine

## 2021-06-09 ENCOUNTER — Ambulatory Visit: Payer: 59 | Admitting: Internal Medicine

## 2021-06-09 DIAGNOSIS — J45991 Cough variant asthma: Secondary | ICD-10-CM | POA: Diagnosis not present

## 2021-06-09 MED ORDER — BUDESONIDE-FORMOTEROL FUMARATE 80-4.5 MCG/ACT IN AERO
INHALATION_SPRAY | RESPIRATORY_TRACT | 12 refills | Status: DC
Start: 2021-06-09 — End: 2022-09-05

## 2021-06-09 NOTE — Progress Notes (Signed)
? ?Crystal Wong, female    DOB: 10-18-1955   MRN: [id number] ? ? ?Brief patient profile:  ?89  yowf  never smoker ? Pna as child could not track exp to husband's asbestos powdered clothes ? Last occ with tendency to chest colds requiring inhaler referred to pulmonary clinic in [place]  06/09/2021 by Darla Lesches FNP for onset early January 2023 refractory cough ? ?Rx mucinex/ abx/ abx x 2/ prednisone last rx toward end of March 2023 ? ?History of Present Illness  ?06/09/2021  Pulmonary/ 1st office eval/ Melvyn Novas / [facility]  ?Chief Complaint  ?Patient presents with  ? Consult  ?  Patient had pneumonia in Feb 2023 CXR end of Feb and was told she had thickening in her bronchial tubes. Exposure to asbestos years ago  ?Dyspnea:  limited by L knee pain / still doing some outdoors / uphills ?Cough: after supper then ok overnight and then in am does not wake but back w/in an hour, takes clariton and controls the drainage  ?Sleep: 20 degrees  ?SABA use: not using  ? ?No obvious day to day or daytime variability or assoc excess/ purulent sputum or mucus plugs or hemoptysis or cp or chest tightness, subjective wheeze or ongoing overt sinus  symptoms.  ? ?Sleeping now as above without nocturnal   exacerbation  of respiratory  c/o's or need for noct saba. Also denies any obvious fluctuation of symptoms with weather or environmental changes or other aggravating or alleviating factors except as outlined above  ? ?No unusual exposure hx or h/o  knowledge of premature birth. ? ?Current Allergies, Complete Past Medical History, Past Surgical History, Family History, and Social History were reviewed in [employer] record. ? ?ROS  The following are not active complaints unless bolded ?Hoarseness, sore throat, dysphagia/globus, dental problems, itching, sneezing,  nasal congestion or discharge of excess mucus or purulent secretions, ear ache,   fever, chills, sweats, unintended wt loss or wt gain, classically  pleuritic or exertional cp,  orthopnea pnd or arm/hand swelling  or leg swelling, presyncope, palpitations, abdominal pain, anorexia, nausea, vomiting, diarrhea  or change in bowel habits or change in bladder habits, change in stools or change in urine, dysuria, hematuria,  rash, arthralgias, visual complaints, headache, numbness, weakness or ataxia or problems with walking or coordination,  change in mood or  memory. ?      ?   ? ?Past Medical History:  ?Diagnosis Date  ? Anxiety   ? Asthma   ? Depression   ? GERD (gastroesophageal reflux disease)   ? Heart murmur   ? Hyperlipidemia   ? Insomnia   ? ? ?Outpatient Medications Prior to Visit  ?Medication Sig Dispense Refill  ? albuterol (VENTOLIN HFA) 108 (90 Base) MCG/ACT inhaler 2 puff q6 prn 18 g 1  ? Alirocumab (PRALUENT) 150 MG/ML SOAJ Inject 150 mg into the skin every 14 (fourteen) days. 2 mL 0  ? ASPIRIN 81 PO Take 81 mg by mouth daily.    ? citalopram (CELEXA) 40 MG tablet TAKE 1 TABLET BY MOUTH EVERY DAY 90 tablet 0  ? dexlansoprazole (DEXILANT) 60 MG capsule Take 1 capsule (60 mg total) by mouth daily. 90 capsule 1  ? famotidine (PEPCID) 20 MG tablet Take 1 tablet (20 mg total) by mouth 2 (two) times daily. 180 tablet 1  ? fluticasone (FLONASE) 50 MCG/ACT nasal spray Place 2 sprays into both nostrils daily. 16 g 6  ? hydrocortisone (ANUSOL-HC) 25 MG  suppository PLACE 1 SUPPOSITORY (25 MG TOTAL) RECTALLY 2 (TWO) TIMES DAILY. (Patient taking differently: Place 25 mg rectally as needed.) 12 suppository 0  ? LORATADINE-D 24HR 10-240 MG 24 hr tablet Take 1 tablet by mouth once daily 90 tablet 0  ? LORazepam (ATIVAN) 1 MG tablet Take 1 tablet (1 mg total) by mouth every 8 (eight) hours as needed. 30 tablet 5  ? melatonin 5 MG TABS Take 5 mg by mouth at bedtime.    ? rosuvastatin (CRESTOR) 40 MG tablet Take 1 tablet (40 mg total) by mouth daily. 90 tablet 1  ? VITAMIN D, CHOLECALCIFEROL, PO Take 1 tablet by mouth daily.     ? ?No facility-administered medications  prior to visit.  ? ? ? ?Objective:  ?  ? ?BP 140/88 (BP Location: Left Arm, Patient Position: Sitting, Cuff Size: Normal)   Pulse 77   Temp 98.1 ?F (36.7 ?C) (Oral)   Ht '5\' 6"'$  (1.676 m)   Wt 199 lb 9.6 oz (90.5 kg)   SpO2 98%   BMI 32.22 kg/m?  ? ?SpO2: 98 % RA  ? ?Amb wf nad  ? ? HEENT : nl exam, no cobblestoning or Turbuinate edema/secrtions   ? ? ?NECK :  without JVD/Nodes/TM/ nl carotid upstrokes bilaterally ? ? ?LUNGS: no acc muscle use,  Nl contour chest which is clear to A and P bilaterally without cough on insp or exp maneuvers ? ? ?CV:  RRR  no s3 or murmur or increase in P2, and no edema  ? ?ABD:  soft and nontender with nl inspiratory excursion in the supine position. No bruits or organomegaly appreciated, bowel sounds nl ? ?MS:  Nl gait/ ext warm without deformities, calf tenderness, cyanosis or clubbing ?No obvious joint restrictions  ? ?SKIN: warm and dry without lesions   ? ?NEURO:  alert, approp, nl sensorium with  no motor or cerebellar deficits apparent.  ? ? ? ?I personally reviewed images and agree with radiology impression as follows:  ?CXR:   pa and lateral 05/03/21  ?Mild chronic hyperinflation and central bronchial thickening, ?suggestive of asthma. No focal consolidation/pneumonia ? ? ?   ?Assessment  ? ?Cough variant asthma ?Onset ? In childhood  ?-  06/09/2021 rec symb 80 2bid prn and max gerd rx  ?- 06/09/2021  After extensive coaching inhaler device,  effectiveness =    80%  ? ? ? ?The most common causes of chronic cough in immunocompetent adults include the following: upper airway cough syndrome (UACS), previously referred to as postnasal drip syndrome (PNDS), which is caused by variety of rhinosinus conditions; (2) asthma; (3) GERD; (4) chronic bronchitis from cigarette smoking or other inhaled environmental irritants; (5) nonasthmatic eosinophilic bronchitis; and (6) bronchiectasis. ? ? These conditions, singly or in combination, have accounted for up to 94% of the causes of chronic  cough in prospective studies. ? ? Other conditions have constituted no >6% of the causes in prospective studies These have included bronchogenic carcinoma, chronic interstitial pneumonia, sarcoidosis, left ventricular failure, ACEI-induced cough, and aspiration from a condition associated with pharyngeal dysfunction.   ? ?Chronic cough is often simultaneously caused by more than one condition. A single cause has been found from 38 to 82% of the time, multiple causes from 18 to 62%. Multiply caused cough has been the result of three diseases up to 42% of the time.  ? ?Of the three most common causes of  Sub-acute / recurrent or chronic cough, only one (GERD)  can  actually contribute to/ trigger  the other two (asthma and post nasal drip syndrome)  and perpetuate the cylce of cough. ? ?While not intuitively obvious, many patients with chronic low grade reflux do not cough until there is a primary insult that disturbs the protective epithelial barrier and exposes sensitive nerve endings.   This is typically viral but can due to PNDS and  either may apply here.    ?The point is that once this occurs, it is difficult to eliminate the cycle  using anything but a maximally effective acid suppression regimen at least in the short run, accompanied by an appropriate diet to address non acid GERD and control the asthmatic component with prn symb 80 Based on two studies from NEJM  378; 20 p 1865 (2018) and 380 : p2020-30 (2019) in pts with mild asthma it is reasonable to use low dose symbicort eg 80 2bid "prn" flare in this setting but I emphasized this was only shown with symbicort and takes advantage of the rapid onset of action but is not the same as "rescue therapy" but can be stopped once the acute symptoms have resolved and the need for rescue has been minimized (< 2 x weekly)   ? ?    ?  ? ?Each maintenance medication was reviewed in detail including emphasizing most importantly the difference between maintenance and  prns and under what circumstances the prns are to be triggered using an action plan format where appropriate. ? ?Total time for H and P, chart review, counseling, reviewing hfa device(s) and generating customiz

## 2021-06-09 NOTE — Patient Instructions (Addendum)
Next flare: ? ?GERD (REFLUX)  is an extremely common cause of respiratory symptoms just like yours , many times with no obvious heartburn at all.  ? ? It can be treated with medication, but also with lifestyle changes including elevation of the head of your bed (ideally with 6 -8  inch blocks under the headboard of your bed),  Smoking cessation, avoidance of late meals, excessive alcohol, and avoid fatty foods, chocolate, peppermint, colas, red wine, and acidic juices such as orange juice.  ?NO MINT OR MENTHOL PRODUCTS SO NO COUGH DROPS  ?USE SUGARLESS CANDY INSTEAD (Jolley ranchers or Stover's or Life Savers) or even ice chips will also do - the key is to swallow to prevent all throat clearing. ?NO OIL BASED VITAMINS - use powdered substitutes.  Avoid fish oil when coughing.  ? ?For next flare Symbicort 80 Take 2 puffs first thing in am and then another 2 puffs about 12 hours later. Try x 7 days then taper off  ? ?For cough  > Mucinex dm '1200mg'$  every 12 hours as needed ? ?Pulmonary follow up is as needed  ? ? ?   ?

## 2021-06-09 NOTE — Assessment & Plan Note (Signed)
Onset ? In childhood  ?-  06/09/2021 rec symb 80 2bid prn and max gerd rx  ?- 06/09/2021  After extensive coaching inhaler device,  effectiveness =    80%  ? ? ? ?The most common causes of chronic cough in immunocompetent adults include the following: upper airway cough syndrome (UACS), previously referred to as postnasal drip syndrome (PNDS), which is caused by variety of rhinosinus conditions; (2) asthma; (3) GERD; (4) chronic bronchitis from cigarette smoking or other inhaled environmental irritants; (5) nonasthmatic eosinophilic bronchitis; and (6) bronchiectasis. ? ? These conditions, singly or in combination, have accounted for up to 94% of the causes of chronic cough in prospective studies. ? ? Other conditions have constituted no >6% of the causes in prospective studies These have included bronchogenic carcinoma, chronic interstitial pneumonia, sarcoidosis, left ventricular failure, ACEI-induced cough, and aspiration from a condition associated with pharyngeal dysfunction.   ? ?Chronic cough is often simultaneously caused by more than one condition. A single cause has been found from 38 to 82% of the time, multiple causes from 18 to 62%. Multiply caused cough has been the result of three diseases up to 42% of the time.  ? ?Of the three most common causes of  Sub-acute / recurrent or chronic cough, only one (GERD)  can actually contribute to/ trigger  the other two (asthma and post nasal drip syndrome)  and perpetuate the cylce of cough. ? ?While not intuitively obvious, many patients with chronic low grade reflux do not cough until there is a primary insult that disturbs the protective epithelial barrier and exposes sensitive nerve endings.   This is typically viral but can due to PNDS and  either may apply here.    ?The point is that once this occurs, it is difficult to eliminate the cycle  using anything but a maximally effective acid suppression regimen at least in the short run, accompanied by an appropriate  diet to address non acid GERD and control the asthmatic component with prn symb 80 Based on two studies from NEJM  378; 20 p 1865 (2018) and 380 : p2020-30 (2019) in pts with mild asthma it is reasonable to use low dose symbicort eg 80 2bid "prn" flare in this setting but I emphasized this was only shown with symbicort and takes advantage of the rapid onset of action but is not the same as "rescue therapy" but can be stopped once the acute symptoms have resolved and the need for rescue has been minimized (< 2 x weekly)   ? ?    ?  ? ?Each maintenance medication was reviewed in detail including emphasizing most importantly the difference between maintenance and prns and under what circumstances the prns are to be triggered using an action plan format where appropriate. ? ?Total time for H and P, chart review, counseling, reviewing hfa device(s) and generating customized AVS unique to this office visit / same day charting> 45 min  ?     ?  ?    ? ?  ?

## 2021-06-27 ENCOUNTER — Other Ambulatory Visit: Payer: Self-pay | Admitting: Nurse Practitioner

## 2021-07-04 DIAGNOSIS — L661 Lichen planopilaris: Secondary | ICD-10-CM | POA: Diagnosis not present

## 2021-07-05 ENCOUNTER — Ambulatory Visit (INDEPENDENT_AMBULATORY_CARE_PROVIDER_SITE_OTHER): Payer: 59 | Admitting: Nurse Practitioner

## 2021-07-05 ENCOUNTER — Ambulatory Visit (INDEPENDENT_AMBULATORY_CARE_PROVIDER_SITE_OTHER): Payer: 59

## 2021-07-05 ENCOUNTER — Encounter: Payer: Self-pay | Admitting: Nurse Practitioner

## 2021-07-05 ENCOUNTER — Telehealth: Payer: Self-pay | Admitting: Nurse Practitioner

## 2021-07-05 VITALS — BP 139/66 | HR 81 | Temp 97.7°F | Resp 20 | Ht 66.0 in | Wt 199.0 lb

## 2021-07-05 DIAGNOSIS — R059 Cough, unspecified: Secondary | ICD-10-CM | POA: Diagnosis not present

## 2021-07-05 DIAGNOSIS — J40 Bronchitis, not specified as acute or chronic: Secondary | ICD-10-CM

## 2021-07-05 DIAGNOSIS — R051 Acute cough: Secondary | ICD-10-CM | POA: Diagnosis not present

## 2021-07-05 MED ORDER — PREDNISONE 10 MG (21) PO TBPK: ORAL_TABLET | ORAL | 0 refills | Status: DC

## 2021-07-05 MED ORDER — AZITHROMYCIN 250 MG PO TABS: ORAL_TABLET | ORAL | 0 refills | Status: DC

## 2021-07-05 MED ORDER — BENZONATATE 100 MG PO CAPS: 100.0000 mg | ORAL_CAPSULE | Freq: Three times a day (TID) | ORAL | 0 refills | Status: DC | PRN

## 2021-07-05 MED ORDER — FLUCONAZOLE 150 MG PO TABS
150.0000 mg | ORAL_TABLET | Freq: Once | ORAL | 0 refills | Status: AC
Start: 2021-07-05 — End: 2021-07-05

## 2021-07-05 NOTE — Patient Instructions (Signed)
  Acute Bronchitis, Adult  Acute bronchitis is when air tubes in the lungs (bronchi) suddenly get swollen. The condition can make it hard for you to breathe. In adults, acute bronchitis usually goes away within 2 weeks. A cough caused by bronchitis may last up to 3 weeks. Smoking, allergies, and asthma can make the condition worse. What are the causes? Germs that cause cold and flu (viruses). The most common cause of this condition is the virus that causes the common cold. Bacteria. Substances that bother (irritate) the lungs, including: Smoke from cigarettes and other types of tobacco. Dust and pollen. Fumes from chemicals, gases, or burned fuel. Indoor or outdoor air pollution. What increases the risk? A weak body's defense system. This is also called the immune system. Any condition that affects your lungs and breathing, such as asthma. What are the signs or symptoms? A cough. Coughing up clear, yellow, or green mucus. Making high-pitched whistling sounds when you breathe, most often when you breathe out (wheezing). Runny or stuffy nose. Having too much mucus in your lungs (chest congestion). Shortness of breath. Body aches. A sore throat. How is this treated? Acute bronchitis may go away over time without treatment. Your doctor may tell you to: Drink more fluids. This will help thin your mucus so it is easier to cough up. Use a device that gets medicine into your lungs (inhaler). Use a vaporizer or a humidifier. These are machines that add water to the air. This helps with coughing and poor breathing. Take a medicine that thins mucus and helps clear it from your lungs. Take a medicine that prevents or stops coughing. It is not common to take an antibiotic medicine for this condition. Follow these instructions at home:  Take over-the-counter and prescription medicines only as told by your doctor. Use an inhaler, vaporizer, or humidifier as told by your doctor. Take two  teaspoons (10 mL) of honey at bedtime. This helps lessen your coughing at night. Drink enough fluid to keep your pee (urine) pale yellow. Do not smoke or use any products that contain nicotine or tobacco. If you need help quitting, ask your doctor. Get a lot of rest. Return to your normal activities when your doctor says that it is safe. Keep all follow-up visits. How is this prevented?  Wash your hands often with soap and water for at least 20 seconds. If you cannot use soap and water, use hand sanitizer. Avoid contact with people who have cold symptoms. Try not to touch your mouth, nose, or eyes with your hands. Avoid breathing in smoke or chemical fumes. Make sure to get the flu shot every year. Contact a doctor if: Your symptoms do not get better in 2 weeks. You have trouble coughing up the mucus. Your cough keeps you awake at night. You have a fever. Get help right away if: You cough up blood. You have chest pain. You have very bad shortness of breath. You faint or keep feeling like you are going to faint. You have a very bad headache. Your fever or chills get worse. These symptoms may be an emergency. Get help right away. Call your local emergency services (911 in the [place] Do not wait to see if the symptoms will go away. Do not drive yourself to the hospital. Summary Acute bronchitis is when air tubes in the lungs (bronchi) suddenly get swollen. In adults, acute bronchitis usually goes away within 2 weeks. Drink more fluids. This will help thin your mucus so it   is easier to cough up. Take over-the-counter and prescription medicines only as told by your doctor. Contact a doctor if your symptoms do not improve after 2 weeks of treatment. This information is not intended to replace advice given to you by your health care provider. Make sure you discuss any questions you have with your health care provider. Document Revised: 06/23/2020 Document Reviewed: 06/23/2020 Elsevier  Patient Education  2023 [employer] Inc.  

## 2021-07-05 NOTE — Telephone Encounter (Signed)
Appt made for today per patients request.  ?

## 2021-07-05 NOTE — Telephone Encounter (Signed)
Pt had a visit with MMM this morning and forgot to ask her if she could go ahead and call her in a Rx for yeast infection because she usually gets one anytime she takes an antibiotic. ?

## 2021-07-05 NOTE — Telephone Encounter (Signed)
Patient informed. 

## 2021-07-05 NOTE — Telephone Encounter (Signed)
Diflucan called into pharmacy ?

## 2021-07-05 NOTE — Progress Notes (Signed)
? ?  Subjective:  ? ? Patient ID: Crystal Wong, female    DOB: [birth year], 66 y.o.   MRN: [id number] ? ? ?Chief Complaint: Cough and Chest Pain ? ? ?HPI ?Patient comes in c/o cough. Started a week ago with sore throat. Now she has cough an congestion. The cough is waking her up at night. She has been using her symbicort daily. ? ? ?Review of Systems ? ?   ?Objective:  ? Physical Exam ?Vitals reviewed.  ?Constitutional:   ?   Appearance: She is well-developed.  ?HENT:  ?   Right Ear: Tympanic membrane normal.  ?   Left Ear: Tympanic membrane normal.  ?   Nose: No congestion or rhinorrhea.  ?Cardiovascular:  ?   Rate and Rhythm: Normal rate and regular rhythm.  ?Pulmonary:  ?   Effort: Pulmonary effort is normal.  ?   Breath sounds: Normal breath sounds. No wheezing.  ?   Comments: Diminished breath sound left lower lobe ?Skin: ?   General: Skin is warm.  ?Neurological:  ?   General: No focal deficit present.  ?   Mental Status: She is alert and oriented to person, place, and time.  ?Psychiatric:     ?   Mood and Affect: Mood normal.     ?   Behavior: Behavior normal.  ? ?BP 139/66   Pulse 81   Temp 97.7 ?F (36.5 ?C) (Temporal)   Resp 20   Ht '5\' 6"'$  (1.676 m)   Wt 199 lb (90.3 kg)   SpO2 99%   BMI 32.12 kg/m?  ? ? ? ? ?   ?Assessment & Plan:  ?[name] in today with chief complaint of Cough and Chest Pain ? ? ?1. Acute cough ?- DG Chest 2 View ? ?2. Bronchitis ?Continue inhaler ?Force fluids ?Runn humidifier ?Continue mucinex or tessalon perles  ?- azithromycin (ZITHROMAX Z-PAK) 250 MG tablet; As directed  Dispense: 6 tablet; Refill: 0 ?- predniSONE (STERAPRED UNI-PAK 21 TAB) 10 MG (21) TBPK tablet; As directed x 6 days  Dispense: 21 tablet; Refill: 0 ?- benzonatate (TESSALON PERLES) 100 MG capsule; Take 1 capsule (100 mg total) by mouth 3 (three) times daily as needed for cough.  Dispense: 20 capsule; Refill: 0 ? ? ? ?The above assessment and management plan was discussed with the patient. The patient  verbalized understanding of and has agreed to the management plan. Patient is aware to call the clinic if symptoms persist or worsen. Patient is aware when to return to the clinic for a follow-up visit. Patient educated on when it is appropriate to go to the emergency department.  ? ?[name] Done, FNP ? ? ? ?

## 2021-07-14 ENCOUNTER — Other Ambulatory Visit: Payer: Self-pay | Admitting: Nurse Practitioner

## 2021-07-14 DIAGNOSIS — K648 Other hemorrhoids: Secondary | ICD-10-CM

## 2021-07-17 ENCOUNTER — Other Ambulatory Visit: Payer: Self-pay | Admitting: Nurse Practitioner

## 2021-07-17 DIAGNOSIS — F3342 Major depressive disorder, recurrent, in full remission: Secondary | ICD-10-CM

## 2021-07-19 ENCOUNTER — Other Ambulatory Visit: Payer: Self-pay | Admitting: Nurse Practitioner

## 2021-07-19 DIAGNOSIS — E782 Mixed hyperlipidemia: Secondary | ICD-10-CM

## 2021-08-08 ENCOUNTER — Other Ambulatory Visit: Payer: Self-pay | Admitting: Nurse Practitioner

## 2021-08-11 ENCOUNTER — Ambulatory Visit: Payer: 59

## 2021-08-11 ENCOUNTER — Telehealth: Payer: Self-pay | Admitting: Nurse Practitioner

## 2021-08-11 NOTE — Telephone Encounter (Signed)
Pt called stating that she had an appt today but called to cancel it because she thought she could deal with her arm. Now says she cant and wants to be seen. Explained to pt that we are booked for this afternoon but offered her first thing tomorrow. Pt declined. Wants to know if she can put alcohol on her arm wound.

## 2021-08-11 NOTE — Telephone Encounter (Signed)
Patient has a wound on her arm. She states she is not sure what happened but wants to get checked out. I made her appt for tomorrow morning 08/12/21

## 2021-08-12 ENCOUNTER — Encounter: Payer: Self-pay | Admitting: Family Medicine

## 2021-08-12 ENCOUNTER — Ambulatory Visit (INDEPENDENT_AMBULATORY_CARE_PROVIDER_SITE_OTHER): Payer: 59 | Admitting: Family Medicine

## 2021-08-12 VITALS — BP 131/73 | HR 77 | Temp 97.1°F | Ht 66.0 in | Wt 198.2 lb

## 2021-08-12 DIAGNOSIS — S51812A Laceration without foreign body of left forearm, initial encounter: Secondary | ICD-10-CM | POA: Diagnosis not present

## 2021-08-12 NOTE — Progress Notes (Signed)
Assessment & Plan:  1. Skin tear of left forearm without complication, initial encounter No s/s on infection. Small wound that does not require closure. Wound cleansed with normal saline, patted dry, and covered with a Tegaderm. Discussed wound care.   Follow up plan: Return if symptoms worsen or fail to improve.  Hendricks Limes, MSN, APRN, FNP-C [facility]  Subjective:   Patient ID: Crystal Wong, female    DOB: Oct 13, 1955, 66 y.o.   MRN: [id number]  HPI: Crystal Wong is a 66 y.o. female presenting on 08/12/2021 for Wound Check (Left arm - noticed it yesterday )  Patient noticed a wound on her left arm yesterday. She was cleaning in the garage and thinks she hit it on a box. She has cleaned it and covered it with a bandage.    ROS: Negative unless specifically indicated above in HPI.   Relevant past medical history reviewed and updated as indicated.   Allergies and medications reviewed and updated.   Current Outpatient Medications:    albuterol (VENTOLIN HFA) 108 (90 Base) MCG/ACT inhaler, 2 puff q6 prn, Disp: 18 g, Rfl: 1   Alirocumab (PRALUENT) 150 MG/ML SOAJ, Inject 150 mg into the skin every 14 (fourteen) days., Disp: 2 mL, Rfl: 0   ASPIRIN 81 PO, Take 81 mg by mouth daily., Disp: , Rfl:    benzonatate (TESSALON PERLES) 100 MG capsule, Take 1 capsule (100 mg total) by mouth 3 (three) times daily as needed for cough., Disp: 20 capsule, Rfl: 0   budesonide-formoterol (SYMBICORT) 80-4.5 MCG/ACT inhaler, Take 2 puffs first thing in am and then another 2 puffs about 12 hours later., Disp: 1 each, Rfl: 12   citalopram (CELEXA) 40 MG tablet, TAKE 1 TABLET BY MOUTH EVERY DAY, Disp: 90 tablet, Rfl: 0   dexlansoprazole (DEXILANT) 60 MG capsule, Take 1 capsule (60 mg total) by mouth daily., Disp: 90 capsule, Rfl: 1   famotidine (PEPCID) 20 MG tablet, Take 1 tablet (20 mg total) by mouth 2 (two) times daily., Disp: 180 tablet, Rfl: 1   fluticasone (FLONASE) 50  MCG/ACT nasal spray, Place 2 sprays into both nostrils daily., Disp: 16 g, Rfl: 6   hydrocortisone (ANUSOL-HC) 25 MG suppository, PLACE 1 SUPPOSITORY (25 MG TOTAL) RECTALLY 2 (TWO) TIMES DAILY., Disp: 12 suppository, Rfl: 0   LORATADINE-D 24HR 10-240 MG 24 hr tablet, Take 1 tablet by mouth once daily, Disp: 90 tablet, Rfl: 1   LORazepam (ATIVAN) 1 MG tablet, Take 1 tablet (1 mg total) by mouth every 8 (eight) hours as needed., Disp: 30 tablet, Rfl: 5   melatonin 5 MG TABS, Take 5 mg by mouth at bedtime., Disp: , Rfl:    rosuvastatin (CRESTOR) 40 MG tablet, TAKE 1 TABLET BY MOUTH EVERY DAY, Disp: 30 tablet, Rfl: 5   VITAMIN D, CHOLECALCIFEROL, PO, Take 1 tablet by mouth daily. , Disp: , Rfl:   Allergies  Allergen Reactions   Sulfa Antibiotics Anaphylaxis   Sulfamethoxazole-Trimethoprim Swelling   Elemental Sulfur Other (See Comments)    Other reaction(s): Angioedema (ALLERGY/intolerance) Other reaction(s): Angioedema (ALLERGY/intolerance)    Objective:   BP 131/73   Pulse 77   Temp (!) 97.1 F (36.2 C) (Temporal)   Ht '5\' 6"'$  (1.676 m)   Wt 198 lb 3.2 oz (89.9 kg)   SpO2 95%   BMI 31.99 kg/m    Physical Exam Vitals reviewed.  Constitutional:      General: She is not in acute distress.  Appearance: Normal appearance. She is not ill-appearing, toxic-appearing or diaphoretic.  HENT:     Head: Normocephalic and atraumatic.  Eyes:     General: No scleral icterus.       Right eye: No discharge.        Left eye: No discharge.     Conjunctiva/sclera: Conjunctivae normal.  Cardiovascular:     Rate and Rhythm: Normal rate.  Pulmonary:     Effort: Pulmonary effort is normal. No respiratory distress.  Musculoskeletal:        General: Normal range of motion.     Cervical back: Normal range of motion.  Skin:    General: Skin is warm and dry.     Capillary Refill: Capillary refill takes less than 2 seconds.     Findings: Laceration (left forearm skin tear without without erythema,  warmth or swelling. A drop of dried blood is present.) present.  Neurological:     General: No focal deficit present.     Mental Status: She is alert and oriented to person, place, and time. Mental status is at baseline.  Psychiatric:        Mood and Affect: Mood normal.        Behavior: Behavior normal.        Thought Content: Thought content normal.        Judgment: Judgment normal.

## 2021-08-15 DIAGNOSIS — L661 Lichen planopilaris: Secondary | ICD-10-CM | POA: Diagnosis not present

## 2021-08-18 ENCOUNTER — Telehealth: Payer: 59 | Admitting: Physician Assistant

## 2021-08-18 DIAGNOSIS — B370 Candidal stomatitis: Secondary | ICD-10-CM | POA: Diagnosis not present

## 2021-08-18 DIAGNOSIS — B3731 Acute candidiasis of vulva and vagina: Secondary | ICD-10-CM | POA: Diagnosis not present

## 2021-08-18 MED ORDER — FLUCONAZOLE 150 MG PO TABS: 150.0000 mg | ORAL_TABLET | Freq: Once | ORAL | 0 refills | Status: AC

## 2021-08-18 MED ORDER — NYSTATIN 100000 UNIT/ML MT SUSP: 5.0000 mL | Freq: Four times a day (QID) | OROMUCOSAL | 0 refills | Status: DC

## 2021-08-18 NOTE — Progress Notes (Signed)
I have spent 5 minutes in review of e-visit questionnaire, review and updating patient chart, medical decision making and response to patient.   Akeema Broder Cody Carson Meche, PA-C    

## 2021-08-18 NOTE — Progress Notes (Signed)

## 2021-08-22 ENCOUNTER — Telehealth: Payer: 59 | Admitting: Physician Assistant

## 2021-08-22 DIAGNOSIS — R112 Nausea with vomiting, unspecified: Secondary | ICD-10-CM

## 2021-08-22 MED ORDER — ONDANSETRON 4 MG PO TBDP: 4.0000 mg | ORAL_TABLET | Freq: Three times a day (TID) | ORAL | 0 refills | Status: DC | PRN

## 2021-08-22 NOTE — Progress Notes (Signed)
[place] 

## 2021-08-22 NOTE — Addendum Note (Signed)
Addended by: Mar Daring on: 08/22/2021 12:07 PM   Modules accepted: Orders

## 2021-08-22 NOTE — Progress Notes (Signed)

## 2021-08-25 DIAGNOSIS — B370 Candidal stomatitis: Secondary | ICD-10-CM | POA: Diagnosis not present

## 2021-08-25 DIAGNOSIS — E7800 Pure hypercholesterolemia, unspecified: Secondary | ICD-10-CM | POA: Diagnosis not present

## 2021-08-29 ENCOUNTER — Ambulatory Visit (INDEPENDENT_AMBULATORY_CARE_PROVIDER_SITE_OTHER): Payer: 59 | Admitting: Family Medicine

## 2021-08-29 ENCOUNTER — Encounter: Payer: Self-pay | Admitting: Family Medicine

## 2021-08-29 VITALS — BP 114/71 | HR 73 | Temp 98.0°F | Ht 66.0 in | Wt 196.4 lb

## 2021-08-29 DIAGNOSIS — B3731 Acute candidiasis of vulva and vagina: Secondary | ICD-10-CM

## 2021-08-29 DIAGNOSIS — K143 Hypertrophy of tongue papillae: Secondary | ICD-10-CM | POA: Diagnosis not present

## 2021-08-29 DIAGNOSIS — N898 Other specified noninflammatory disorders of vagina: Secondary | ICD-10-CM | POA: Diagnosis not present

## 2021-08-29 LAB — WET PREP FOR TRICH, YEAST, CLUE
Clue Cell Exam: NEGATIVE
Trichomonas Exam: NEGATIVE
Yeast Exam: POSITIVE — AB

## 2021-08-29 MED ORDER — FLUCONAZOLE 150 MG PO TABS: ORAL_TABLET | ORAL | 0 refills | Status: DC

## 2021-08-30 ENCOUNTER — Encounter: Payer: Self-pay | Admitting: Nurse Practitioner

## 2021-08-30 ENCOUNTER — Ambulatory Visit (INDEPENDENT_AMBULATORY_CARE_PROVIDER_SITE_OTHER): Payer: 59 | Admitting: Nurse Practitioner

## 2021-08-30 VITALS — BP 117/72 | HR 70 | Temp 97.7°F | Resp 20 | Ht 66.0 in | Wt 196.0 lb

## 2021-08-30 DIAGNOSIS — E8881 Metabolic syndrome: Secondary | ICD-10-CM

## 2021-08-30 DIAGNOSIS — K2100 Gastro-esophageal reflux disease with esophagitis, without bleeding: Secondary | ICD-10-CM

## 2021-08-30 DIAGNOSIS — F411 Generalized anxiety disorder: Secondary | ICD-10-CM

## 2021-08-30 DIAGNOSIS — F3342 Major depressive disorder, recurrent, in full remission: Secondary | ICD-10-CM | POA: Diagnosis not present

## 2021-08-30 DIAGNOSIS — K1321 Leukoplakia of oral mucosa, including tongue: Secondary | ICD-10-CM | POA: Diagnosis not present

## 2021-08-30 DIAGNOSIS — R5383 Other fatigue: Secondary | ICD-10-CM

## 2021-08-30 DIAGNOSIS — Z6833 Body mass index (BMI) 33.0-33.9, adult: Secondary | ICD-10-CM

## 2021-08-30 DIAGNOSIS — Z23 Encounter for immunization: Secondary | ICD-10-CM

## 2021-08-30 DIAGNOSIS — F5101 Primary insomnia: Secondary | ICD-10-CM

## 2021-08-30 DIAGNOSIS — J45991 Cough variant asthma: Secondary | ICD-10-CM | POA: Diagnosis not present

## 2021-08-30 DIAGNOSIS — E782 Mixed hyperlipidemia: Secondary | ICD-10-CM | POA: Diagnosis not present

## 2021-08-30 DIAGNOSIS — R69 Illness, unspecified: Secondary | ICD-10-CM | POA: Diagnosis not present

## 2021-08-30 LAB — BAYER DCA HB A1C WAIVED: HB A1C (BAYER DCA - WAIVED): 6.9 % — ABNORMAL HIGH (ref 4.8–5.6)

## 2021-08-30 MED ORDER — CITALOPRAM HYDROBROMIDE 40 MG PO TABS: 40.0000 mg | ORAL_TABLET | Freq: Every day | ORAL | 1 refills | Status: DC

## 2021-08-30 MED ORDER — ROSUVASTATIN CALCIUM 40 MG PO TABS: 40.0000 mg | ORAL_TABLET | Freq: Every day | ORAL | 1 refills | Status: DC

## 2021-08-30 MED ORDER — DEXLANSOPRAZOLE 60 MG PO CPDR: 60.0000 mg | DELAYED_RELEASE_CAPSULE | Freq: Every day | ORAL | 1 refills | Status: DC

## 2021-08-30 MED ORDER — NYSTATIN 100000 UNIT/ML MT SUSP: 5.0000 mL | Freq: Four times a day (QID) | OROMUCOSAL | 0 refills | Status: DC

## 2021-08-30 MED ORDER — FAMOTIDINE 20 MG PO TABS: 20.0000 mg | ORAL_TABLET | Freq: Two times a day (BID) | ORAL | 1 refills | Status: DC

## 2021-08-30 MED ORDER — LORAZEPAM 1 MG PO TABS: 1.0000 mg | ORAL_TABLET | Freq: Three times a day (TID) | ORAL | 1 refills | Status: DC | PRN

## 2021-08-31 LAB — LIPID PANEL
Chol/HDL Ratio: 2.7 ratio (ref 0.0–4.4)
Cholesterol, Total: 167 mg/dL (ref 100–199)
HDL: 63 mg/dL (ref >39)
LDL Chol Calc (NIH): 86 mg/dL (ref 0–99)
Triglycerides: 100 mg/dL (ref 0–149)
VLDL Cholesterol Cal: 18 mg/dL (ref 5–40)

## 2021-08-31 LAB — CBC WITH DIFFERENTIAL/PLATELET
Basophils Absolute: 0.1 10*3/uL (ref 0.0–0.2)
Basos: 1 %
EOS (ABSOLUTE): 0.1 10*3/uL (ref 0.0–0.4)
Eos: 1 %
Hematocrit: 40.7 % (ref 34.0–46.6)
Hemoglobin: 13.6 g/dL (ref 11.1–15.9)
Immature Grans (Abs): 0 10*3/uL (ref 0.0–0.1)
Immature Granulocytes: 0 %
Lymphocytes Absolute: 1.4 10*3/uL (ref 0.7–3.1)
Lymphs: 26 %
MCH: 29.9 pg (ref 26.6–33.0)
MCHC: 33.4 g/dL (ref 31.5–35.7)
MCV: 90 fL (ref 79–97)
Monocytes Absolute: 0.3 10*3/uL (ref 0.1–0.9)
Monocytes: 6 %
Neutrophils Absolute: 3.7 10*3/uL (ref 1.4–7.0)
Neutrophils: 66 %
Platelets: 236 10*3/uL (ref 150–450)
RBC: 4.55 x10E6/uL (ref 3.77–5.28)
RDW: 13.1 % (ref 11.7–15.4)
WBC: 5.6 10*3/uL (ref 3.4–10.8)

## 2021-08-31 LAB — THYROID PANEL WITH TSH
Free Thyroxine Index: 1.7 (ref 1.2–4.9)
T3 Uptake Ratio: 25 % (ref 24–39)
T4, Total: 6.9 ug/dL (ref 4.5–12.0)
TSH: 0.954 u[IU]/mL (ref 0.450–4.500)

## 2021-08-31 LAB — CMP14+EGFR
ALT: 19 IU/L (ref 0–32)
AST: 23 IU/L (ref 0–40)
Albumin/Globulin Ratio: 1.8 (ref 1.2–2.2)
Albumin: 4.3 g/dL (ref 3.8–4.8)
Alkaline Phosphatase: 79 IU/L (ref 44–121)
BUN/Creatinine Ratio: 17 (ref 12–28)
BUN: 15 mg/dL (ref 8–27)
Bilirubin Total: 0.4 mg/dL (ref 0.0–1.2)
CO2: 24 mmol/L (ref 20–29)
Calcium: 9.5 mg/dL (ref 8.7–10.3)
Chloride: 105 mmol/L (ref 96–106)
Creatinine, Ser: 0.87 mg/dL (ref 0.57–1.00)
Globulin, Total: 2.4 g/dL (ref 1.5–4.5)
Glucose: 121 mg/dL — ABNORMAL HIGH (ref 70–99)
Potassium: 4.8 mmol/L (ref 3.5–5.2)
Sodium: 142 mmol/L (ref 134–144)
Total Protein: 6.7 g/dL (ref 6.0–8.5)
eGFR: 75 mL/min/{1.73_m2} (ref >59)

## 2021-08-31 NOTE — Addendum Note (Signed)
Addended by: Rolena Infante on: 08/31/2021 08:37 AM   Modules accepted: Orders

## 2021-09-12 ENCOUNTER — Other Ambulatory Visit: Payer: Self-pay | Admitting: Nurse Practitioner

## 2021-09-12 ENCOUNTER — Telehealth: Payer: Self-pay | Admitting: Nurse Practitioner

## 2021-09-12 DIAGNOSIS — B370 Candidal stomatitis: Secondary | ICD-10-CM

## 2021-09-12 NOTE — Progress Notes (Signed)
Referral ent

## 2021-09-12 NOTE — Telephone Encounter (Signed)
Pt called to let MMM know that she still has the thrush and has been taking her medicine as directed.   Wants to know if she should see a specialist about it?

## 2021-09-12 NOTE — Telephone Encounter (Signed)
Will do ENT referral

## 2021-09-12 NOTE — Telephone Encounter (Signed)
Patient aware.

## 2021-09-16 ENCOUNTER — Telehealth: Payer: Self-pay | Admitting: Nurse Practitioner

## 2021-09-16 NOTE — Telephone Encounter (Signed)
Yes have patient be seen

## 2021-09-16 NOTE — Telephone Encounter (Signed)
Crystal Wong seen pt on 6/27 for this. Nystatin prescribed. Does the pt need to come back or can a different med be called in?

## 2021-09-16 NOTE — Telephone Encounter (Signed)
Appt made with MMM on 7/18 at 9:15

## 2021-09-20 ENCOUNTER — Encounter: Payer: Self-pay | Admitting: Nurse Practitioner

## 2021-09-20 ENCOUNTER — Ambulatory Visit (INDEPENDENT_AMBULATORY_CARE_PROVIDER_SITE_OTHER): Payer: 59 | Admitting: Nurse Practitioner

## 2021-09-20 VITALS — BP 119/73 | HR 72 | Temp 97.7°F | Resp 20 | Ht 66.0 in | Wt 193.0 lb

## 2021-09-20 DIAGNOSIS — B370 Candidal stomatitis: Secondary | ICD-10-CM

## 2021-09-20 MED ORDER — CLOTRIMAZOLE 10 MG MT TROC: 10.0000 mg | Freq: Every day | OROMUCOSAL | 0 refills | Status: DC

## 2021-09-20 NOTE — Progress Notes (Signed)
   Subjective:    Patient ID: Crystal Wong, female    DOB: [birth year], 66 y.o.   MRN: [id number]   Chief Complaint: thrush  HPI Patient is in today for follow up of thrush. She has been on nystatin with no change. ENT referral was made but no appointment has been made.    Review of Systems  Constitutional:  Negative for diaphoresis.  Eyes:  Negative for pain.  Respiratory:  Negative for shortness of breath.   Cardiovascular:  Negative for chest pain, palpitations and leg swelling.  Gastrointestinal:  Negative for abdominal pain.  Endocrine: Negative for polydipsia.  Skin:  Negative for rash.  Neurological:  Negative for dizziness, weakness and headaches.  Hematological:  Does not bruise/bleed easily.  All other systems reviewed and are negative.      Objective:   Physical Exam Constitutional:      Appearance: Normal appearance.  HENT:     Mouth/Throat:     Comments: White film on tongue Cardiovascular:     Rate and Rhythm: Normal rate and regular rhythm.     Heart sounds: Normal heart sounds.  Pulmonary:     Effort: Pulmonary effort is normal.     Breath sounds: Normal breath sounds.  Skin:    General: Skin is warm.  Neurological:     General: No focal deficit present.     Mental Status: She is alert and oriented to person, place, and time.  Psychiatric:        Mood and Affect: Mood normal.        Behavior: Behavior normal.   BP 119/73   Pulse 72   Temp 97.7 F (36.5 C) (Temporal)   Resp 20   Ht '5\' 6"'$  (1.676 m)   Wt 193 lb (87.5 kg)   SpO2 97%   BMI 31.15 kg/m          Assessment & Plan:   Crystal Wong in today with chief complaint of No chief complaint on file.   1. Thrush Referral to ENT Continue baking soda tooth paste Meds ordered this encounter  Medications   clotrimazole (MYCELEX) 10 MG troche    Sig: Take 1 tablet (10 mg total) by mouth 5 (five) times daily.    Dispense:  70 Troche    Refill:  0    Order Specific Question:    Supervising Provider    Answer:   Caryl Pina A [[id number]]        The above assessment and management plan was discussed with the patient. The patient verbalized understanding of and has agreed to the management plan. Patient is aware to call the clinic if symptoms persist or worsen. Patient is aware when to return to the clinic for a follow-up visit. Patient educated on when it is appropriate to go to the emergency department.   [name] Done, FNP

## 2021-09-20 NOTE — Patient Instructions (Signed)
Oral Thrush, Adult Oral thrush is an infection in your mouth and throat and on your tongue. It causes white patches to form in your mouth and on your tongue. Many cases of thrush are mild. But, sometimes, thrush can be serious. People who have a weak body defense system (immune system) or other diseases can be affected more. What are the causes? This condition is caused by a type of fungus called yeast. The fungus is normally present in small amounts in the mouth and nose. If a person has a long-term illness or a weak body defense system, the fungus can grow and spread quickly. This causes thrush. What increases the risk? You are more likely to develop this condition if: You have a weak body defense system. You are an older adult. You have diabetes, cancer, or HIV. You have a dry mouth. You are pregnant or breastfeeding. You do not take good care of your teeth. This risk is greater for people who have false teeth (dentures). You use antibiotic or steroid medicines. What are the signs or symptoms? Symptoms of this condition include: A burning feeling in the mouth and throat. White patches that stick to the mouth and tongue. A bad taste in the mouth or trouble tasting foods. A feeling like you have cotton in your mouth. Pain when you eat and swallow. Not wanting to eat as much as usual. Cracking at the corners of the mouth. How is this treated? This condition is treated with medicines called antifungals. These medicines prevent a fungus from growing. The medicines are either put right on the area (topical) or swallowed (oral). Your doctor will also treat other problems that you may have, such as diabetes or HIV. Follow these instructions at home: Helping with pain and soreness To lessen your pain: Drink cold liquids, like water and iced tea. Eat frozen ice pops or frozen juices. Eat foods that are easy to swallow, like gelatin and ice cream. Drink from a straw if you have too much pain  in your mouth.  General instructions Take or use over-the-counter and prescription medicines only as told by your doctor. Eat plain yogurt that has live cultures in it. Read the label to make sure that there are live cultures in your yogurt. If you wear false teeth: Take them out before you go to bed. Brush them well. Soak them in a cleaner. Rinse your mouth with warm salt-water many times a day. To make the salt-water mixture, dissolve -1 teaspoon (3-6 g) of salt in 1 cup (237 mL) of warm water. Contact a doctor if: Your problems do not get better within 7 days of treatment. Your infection is spreading. This may show as white areas on the skin outside of your mouth. You are nursing your baby and you have redness and pain in the nipples. Summary Oral thrush is an infection in your mouth and throat. It is caused by a fungus. You are more likely to get this condition if you have a weak body defense system. Diseases like diabetes, cancer, or HIV also add to your risk. This condition is treated with medicines called antifungals. Contact a doctor if you do not get better within 7 days of starting treatment. This information is not intended to replace advice given to you by your health care provider. Make sure you discuss any questions you have with your health care provider. Document Revised: 10/20/2020 Document Reviewed: 12/27/2018 Elsevier Patient Education  2023 [employer].  

## 2021-10-03 ENCOUNTER — Encounter: Payer: Self-pay | Admitting: Family Medicine

## 2021-10-03 ENCOUNTER — Ambulatory Visit: Payer: 59 | Admitting: Family Medicine

## 2021-10-03 VITALS — BP 126/77 | HR 77 | Temp 97.6°F | Ht 66.0 in | Wt 193.2 lb

## 2021-10-03 DIAGNOSIS — R5383 Other fatigue: Secondary | ICD-10-CM

## 2021-10-03 DIAGNOSIS — T783XXA Angioneurotic edema, initial encounter: Secondary | ICD-10-CM | POA: Diagnosis not present

## 2021-10-03 MED ORDER — FLUCONAZOLE 100 MG PO TABS: ORAL_TABLET | ORAL | 0 refills | Status: DC

## 2021-10-03 NOTE — Progress Notes (Signed)
Subjective:  Patient ID: Crystal Wong, female    DOB: 01-19-1956  Age: 66 y.o. MRN: [id number]  CC: Thrush   HPI Crystal Wong presents for tongue swollen. Some sore throat. White mucoid exudate with brushing tongue. Also having pain in back of Crystal Wong throat.   Tired all the time. Onset June 14.  Has ENT appt. In 1 week. No energy for 6 weeks. Sleeping more. Has malaise.      10/03/2021    9:33 AM 09/20/2021    9:26 AM 08/30/2021   11:38 AM  Depression screen PHQ 2/9  Decreased Interest 0 0 0  Down, Depressed, Hopeless 1 0 0  PHQ - 2 Score 1 0 0  Altered sleeping 0 0 0  Tired, decreased energy 1 0 1  Change in appetite 0 0 0  Feeling bad or failure about yourself  0 0 0  Trouble concentrating 0 0 0  Moving slowly or fidgety/restless 0 0 0  Suicidal thoughts 0 0 0  PHQ-9 Score 2 0 1  Difficult doing work/chores Not difficult at all Not difficult at all Not difficult at all    History Crystal Wong has a past medical history of Anxiety, Asthma, Depression, GERD (gastroesophageal reflux disease), Heart murmur, Hyperlipidemia, and Insomnia.   Crystal Wong has a past surgical history that includes Partial hysterectomy; Ankle fracture surgery (Left); Knee arthroscopy (Bilateral); Breast lumpectomy (Bilateral); Carpal tunnel release (Right); Colonoscopy; and Carpal tunnel release (Left).   Crystal Wong family history includes Colon cancer (age of onset: 32) in Crystal Wong father; Colon polyps in Crystal Wong mother; Diabetes in Crystal Wong mother; Gallbladder disease in Crystal Wong daughter; Gallstones in Crystal Wong paternal grandmother; Heart disease in Crystal Wong mother.Crystal Wong reports that Crystal Wong has never smoked. Crystal Wong has never used smokeless tobacco. Crystal Wong reports that Crystal Wong does not currently use alcohol. Crystal Wong reports that Crystal Wong does not use drugs.    ROS Review of Systems  Constitutional:  Positive for fatigue.  HENT:  Positive for sore throat. Negative for congestion, drooling and ear pain.   Eyes:  Negative for visual disturbance.  Respiratory:  Negative for  shortness of breath.   Cardiovascular:  Negative for chest pain.  Gastrointestinal:  Negative for abdominal pain.  Musculoskeletal:  Negative for arthralgias.    Objective:  BP 126/77   Pulse 77   Temp 97.6 F (36.4 C)   Ht 5' 6"  (1.676 m)   Wt 193 lb 3.2 oz (87.6 kg)   SpO2 99%   BMI 31.18 kg/m   BP Readings from Last 3 Encounters:  10/03/21 126/77  09/20/21 119/73  08/30/21 117/72    Wt Readings from Last 3 Encounters:  10/03/21 193 lb 3.2 oz (87.6 kg)  09/20/21 193 lb (87.5 kg)  08/30/21 196 lb (88.9 kg)     Physical Exam Constitutional:      General: Crystal Wong is not in acute distress.    Appearance: Crystal Wong is well-developed.  HENT:     Right Ear: Tympanic membrane normal.     Left Ear: Tympanic membrane normal.     Nose: Nose normal.     Mouth/Throat:     Mouth: Mucous membranes are moist.     Pharynx: No oropharyngeal exudate.  Cardiovascular:     Rate and Rhythm: Normal rate and regular rhythm.  Pulmonary:     Breath sounds: Normal breath sounds.  Musculoskeletal:        General: Normal range of motion.  Skin:    General: Skin is warm and dry.  Neurological:  Mental Status: Crystal Wong is alert and oriented to person, place, and time.       Assessment & Plan:   Crystal Wong was seen today for thrush.  Diagnoses and all orders for this visit:  Angioedema, initial encounter -     CBC with Differential/Platelet -     CMP14+EGFR -     TSH  Fatigue, unspecified type -     CBC with Differential/Platelet -     CMP14+EGFR -     TSH  Other orders -     fluconazole (DIFLUCAN) 100 MG tablet; Take two with first dose. Then starting the next day take one daily until all are taken.       I am having Crystal Wong start on fluconazole. I am also having Crystal Wong maintain Crystal Wong (VITAMIN D, CHOLECALCIFEROL, PO), Praluent, fluticasone, albuterol, melatonin, budesonide-formoterol, hydrocortisone, Loratadine-D 24HR, betamethasone dipropionate, dexlansoprazole, famotidine,  rosuvastatin, citalopram, LORazepam, and clotrimazole.  Allergies as of 10/03/2021       Reactions   Sulfa Antibiotics Anaphylaxis   Sulfamethoxazole-trimethoprim Swelling   Elemental Sulfur Other (See Comments)   Other reaction(s): Angioedema (ALLERGY/intolerance) Other reaction(s): Angioedema (ALLERGY/intolerance)        Medication List        Accurate as of October 03, 2021  9:59 AM. If you have any questions, ask your nurse or doctor.          albuterol 108 (90 Base) MCG/ACT inhaler Commonly known as: VENTOLIN HFA 2 puff q6 prn   betamethasone dipropionate 0.05 % lotion APPLY 1 APPLICATION TO THE SCALP NIGHTLY   budesonide-formoterol 80-4.5 MCG/ACT inhaler Commonly known as: Symbicort Take 2 puffs first thing in am and then another 2 puffs about 12 hours later.   citalopram 40 MG tablet Commonly known as: CELEXA Take 1 tablet (40 mg total) by mouth daily.   clotrimazole 10 MG troche Commonly known as: MYCELEX Take 1 tablet (10 mg total) by mouth 5 (five) times daily.   dexlansoprazole 60 MG capsule Commonly known as: DEXILANT Take 1 capsule (60 mg total) by mouth daily.   famotidine 20 MG tablet Commonly known as: PEPCID Take 1 tablet (20 mg total) by mouth 2 (two) times daily.   fluconazole 100 MG tablet Commonly known as: Diflucan Take two with first dose. Then starting the next day take one daily until all are taken. Started by: Crystal Wong, Crystal Wong   fluticasone 50 MCG/ACT nasal spray Commonly known as: FLONASE Place 2 sprays into both nostrils daily.   hydrocortisone 25 MG suppository Commonly known as: ANUSOL-HC PLACE 1 SUPPOSITORY (25 MG TOTAL) RECTALLY 2 (TWO) TIMES DAILY.   Loratadine-D 24HR 10-240 MG 24 hr tablet Generic drug: loratadine-pseudoephedrine Take 1 tablet by mouth once daily   LORazepam 1 MG tablet Commonly known as: ATIVAN Take 1 tablet (1 mg total) by mouth every 8 (eight) hours as needed.   melatonin 5 MG Tabs Take 5 mg by  mouth at bedtime.   Praluent 150 MG/ML Soaj Generic drug: Alirocumab Inject 150 mg into the skin every 14 (fourteen) days.   rosuvastatin 40 MG tablet Commonly known as: CRESTOR Take 1 tablet (40 mg total) by mouth daily.   VITAMIN D (CHOLECALCIFEROL) PO Take 1 tablet by mouth daily.         Follow-up: Return if symptoms worsen or fail to improve.  Crystal Wong, M.D.

## 2021-10-04 ENCOUNTER — Encounter: Payer: Self-pay | Admitting: Family Medicine

## 2021-10-04 ENCOUNTER — Emergency Department (HOSPITAL_COMMUNITY): Payer: 59

## 2021-10-04 ENCOUNTER — Other Ambulatory Visit: Payer: Self-pay

## 2021-10-04 ENCOUNTER — Emergency Department (HOSPITAL_COMMUNITY)
Admission: EM | Admit: 2021-10-04 | Discharge: 2021-10-05 | Disposition: A | Discharge status: Home / Self Care | Payer: 59 | Attending: Emergency Medicine | Admitting: Emergency Medicine

## 2021-10-04 DIAGNOSIS — J45909 Unspecified asthma, uncomplicated: Secondary | ICD-10-CM | POA: Insufficient documentation

## 2021-10-04 DIAGNOSIS — Z7951 Long term (current) use of inhaled steroids: Secondary | ICD-10-CM | POA: Insufficient documentation

## 2021-10-04 DIAGNOSIS — K121 Other forms of stomatitis: Secondary | ICD-10-CM | POA: Diagnosis not present

## 2021-10-04 DIAGNOSIS — R0789 Other chest pain: Secondary | ICD-10-CM | POA: Diagnosis not present

## 2021-10-04 DIAGNOSIS — R079 Chest pain, unspecified: Secondary | ICD-10-CM | POA: Diagnosis not present

## 2021-10-04 LAB — CMP14+EGFR
ALT: 16 IU/L (ref 0–32)
AST: 19 IU/L (ref 0–40)
Albumin/Globulin Ratio: 2 (ref 1.2–2.2)
Albumin: 4.3 g/dL (ref 3.9–4.9)
Alkaline Phosphatase: 76 IU/L (ref 44–121)
BUN/Creatinine Ratio: 20 (ref 12–28)
BUN: 18 mg/dL (ref 8–27)
Bilirubin Total: 0.2 mg/dL (ref 0.0–1.2)
CO2: 25 mmol/L (ref 20–29)
Calcium: 10 mg/dL (ref 8.7–10.3)
Chloride: 103 mmol/L (ref 96–106)
Creatinine, Ser: 0.91 mg/dL (ref 0.57–1.00)
Globulin, Total: 2.2 g/dL (ref 1.5–4.5)
Glucose: 117 mg/dL — ABNORMAL HIGH (ref 70–99)
Potassium: 4.5 mmol/L (ref 3.5–5.2)
Sodium: 141 mmol/L (ref 134–144)
Total Protein: 6.5 g/dL (ref 6.0–8.5)
eGFR: 70 mL/min/{1.73_m2} (ref >59)

## 2021-10-04 LAB — CBC WITH DIFFERENTIAL/PLATELET
Abs Immature Granulocytes: 0.01 10*3/uL (ref 0.00–0.07)
Basophils Absolute: 0.1 10*3/uL (ref 0.0–0.2)
Basophils Absolute: 0.1 10*3/uL (ref 0.0–0.1)
Basophils Relative: 1 %
Basos: 1 %
EOS (ABSOLUTE): 0.1 10*3/uL (ref 0.0–0.4)
Eos: 3 %
Eosinophils Absolute: 0.1 10*3/uL (ref 0.0–0.5)
Eosinophils Relative: 1 %
HCT: 40.9 % (ref 36.0–46.0)
Hematocrit: 38.6 % (ref 34.0–46.6)
Hemoglobin: 13.2 g/dL (ref 11.1–15.9)
Hemoglobin: 14 g/dL (ref 12.0–15.0)
Immature Grans (Abs): 0 10*3/uL (ref 0.0–0.1)
Immature Granulocytes: 0 %
Immature Granulocytes: 0 %
Lymphocytes Absolute: 1.4 10*3/uL (ref 0.7–3.1)
Lymphocytes Relative: 29 %
Lymphs Abs: 2.1 10*3/uL (ref 0.7–4.0)
Lymphs: 26 %
MCH: 30.1 pg (ref 26.6–33.0)
MCH: 30.2 pg (ref 26.0–34.0)
MCHC: 34.2 g/dL (ref 31.5–35.7)
MCHC: 34.2 g/dL (ref 30.0–36.0)
MCV: 88 fL (ref 79–97)
MCV: 88.1 fL (ref 80.0–100.0)
Monocytes Absolute: 0.4 10*3/uL (ref 0.1–0.9)
Monocytes Absolute: 0.4 10*3/uL (ref 0.1–1.0)
Monocytes Relative: 5 %
Monocytes: 7 %
Neutro Abs: 4.6 10*3/uL (ref 1.7–7.7)
Neutrophils Absolute: 3.5 10*3/uL (ref 1.4–7.0)
Neutrophils Relative %: 64 %
Neutrophils: 63 %
Platelets: 223 10*3/uL (ref 150–450)
Platelets: 252 10*3/uL (ref 150–400)
RBC: 4.39 x10E6/uL (ref 3.77–5.28)
RBC: 4.64 MIL/uL (ref 3.87–5.11)
RDW: 12.6 % (ref 11.7–15.4)
RDW: 12.5 % (ref 11.5–15.5)
WBC: 5.6 10*3/uL (ref 3.4–10.8)
WBC: 7.2 10*3/uL (ref 4.0–10.5)
nRBC: 0 % (ref 0.0–0.2)

## 2021-10-04 LAB — BASIC METABOLIC PANEL
Anion gap: 6 (ref 5–15)
BUN: 10 mg/dL (ref 8–23)
CO2: 26 mmol/L (ref 22–32)
Calcium: 9.5 mg/dL (ref 8.9–10.3)
Chloride: 106 mmol/L (ref 98–111)
Creatinine, Ser: 0.96 mg/dL (ref 0.44–1.00)
GFR, Estimated: >60 mL/min (ref >60)
Glucose, Bld: 105 mg/dL — ABNORMAL HIGH (ref 70–99)
Potassium: 3.5 mmol/L (ref 3.5–5.1)
Sodium: 138 mmol/L (ref 135–145)

## 2021-10-04 LAB — TSH: TSH: 1.01 u[IU]/mL (ref 0.450–4.500)

## 2021-10-04 LAB — TROPONIN I (HIGH SENSITIVITY): Troponin I (High Sensitivity): 3 ng/L (ref <18)

## 2021-10-04 NOTE — Progress Notes (Signed)
Hello Crystal Wong,  Your lab result is normal and/or stable.Some minor variations that are not significant are commonly marked abnormal, but do not represent any medical problem for you.  Best regards, Claretta Fraise, M.D.

## 2021-10-04 NOTE — ED Triage Notes (Signed)
Pt here for thrush and chest pain tightness and "Extreme fatigue". Pt reports her doctor prescribed fluconazole for her yesterday, but pt believes thrush is worse today. Pt reports low appetite and a tightness n her chest.

## 2021-10-04 NOTE — ED Provider Notes (Signed)
[facility] EMERGENCY DEPARTMENT Provider Note   CSN: [id number] Arrival date & time: 10/04/21  2015     History {Add pertinent medical, surgical, social history, OB history to HPI:1} Chief Complaint  Patient presents with   Thrush   Chest Pain    DONNE ROBILLARD is a 66 y.o. female.  HPI     66 year old female with a history of hyperlipidemia, depression, asthma, who was seen by her primary care physician yesterday with concern for tongue swelling was found to have exudate consistent with thrush who presents with fatigue, thrush, chest pain.    She was given a prescription for fluconazole  Past Medical History:  Diagnosis Date   Anxiety    Asthma    Depression    GERD (gastroesophageal reflux disease)    Heart murmur    Hyperlipidemia    Insomnia      Home Medications Prior to Admission medications   Medication Sig Start Date End Date Taking? Authorizing Provider  albuterol (VENTOLIN HFA) 108 (90 Base) MCG/ACT inhaler 2 puff q6 prn 03/21/21   Hassell Done, Mary-Margaret, FNP  Alirocumab (PRALUENT) 150 MG/ML SOAJ Inject 150 mg into the skin every 14 (fourteen) days. 02/25/21   Hassell Done, Mary-Margaret, FNP  betamethasone dipropionate 0.05 % lotion APPLY 1 APPLICATION TO THE SCALP NIGHTLY    [provider]  budesonide-formoterol (SYMBICORT) 80-4.5 MCG/ACT inhaler Take 2 puffs first thing in am and then another 2 puffs about 12 hours later. 06/09/21   Tanda Rockers, MD  citalopram (CELEXA) 40 MG tablet Take 1 tablet (40 mg total) by mouth daily. 08/30/21   Hassell Done, Mary-Margaret, FNP  clotrimazole (MYCELEX) 10 MG troche Take 1 tablet (10 mg total) by mouth 5 (five) times daily. 09/20/21   Hassell Done Mary-Margaret, FNP  dexlansoprazole (DEXILANT) 60 MG capsule Take 1 capsule (60 mg total) by mouth daily. 08/30/21   Hassell Done Mary-Margaret, FNP  famotidine (PEPCID) 20 MG tablet Take 1 tablet (20 mg total) by mouth 2 (two) times daily. 08/30/21   Hassell Done, Mary-Margaret,  FNP  fluconazole (DIFLUCAN) 100 MG tablet Take two with first dose. Then starting the next day take one daily until all are taken. 10/03/21   Claretta Fraise, MD  fluticasone (FLONASE) 50 MCG/ACT nasal spray Place 2 sprays into both nostrils daily. 03/17/21   Baruch Gouty, FNP  hydrocortisone (ANUSOL-HC) 25 MG suppository PLACE 1 SUPPOSITORY (25 MG TOTAL) RECTALLY 2 (TWO) TIMES DAILY. 07/14/21   Claretta Fraise, MD  LORATADINE-D 24HR 10-240 MG 24 hr tablet Take 1 tablet by mouth once daily 08/08/21   Hassell Done, Mary-Margaret, FNP  LORazepam (ATIVAN) 1 MG tablet Take 1 tablet (1 mg total) by mouth every 8 (eight) hours as needed. 08/30/21   Hassell Done, Mary-Margaret, FNP  melatonin 5 MG TABS Take 5 mg by mouth at bedtime.    [provider]  rosuvastatin (CRESTOR) 40 MG tablet Take 1 tablet (40 mg total) by mouth daily. 08/30/21   Hassell Done, Mary-Margaret, FNP  VITAMIN D, CHOLECALCIFEROL, PO Take 1 tablet by mouth daily.     [provider]      Allergies    Sulfa antibiotics, Sulfamethoxazole-trimethoprim, and Elemental sulfur    Review of Systems   Review of Systems  Physical Exam Updated Vital Signs BP (!) 159/82 (BP Location: Right Arm)   Pulse 77   Temp 98.3 F (36.8 C) (Oral)   Resp 18   SpO2 100%  Physical Exam  ED Results / Procedures / Treatments  Labs (all labs ordered are listed, but only abnormal results are displayed) Labs Reviewed  BASIC METABOLIC PANEL - Abnormal; Notable for the following components:      Result Value   Glucose, Bld 105 (*)    All other components within normal limits  CBC WITH DIFFERENTIAL/PLATELET  TROPONIN I (HIGH SENSITIVITY)  TROPONIN I (HIGH SENSITIVITY)    EKG EKG Interpretation  Date/Time:  Tuesday October 04 2021 21:49:32 EDT Ventricular Rate:  72 PR Interval:  188 QRS Duration: 142 QT Interval:  448 QTC Calculation: 490 R Axis:   -75 Text Interpretation: Normal sinus rhythm Right bundle branch block Left anterior fascicular  block *** Bifascicular block *** Abnormal ECG No previous ECGs available Confirmed by Gareth Morgan [phone]) on 10/04/2021 11:27:13 PM  Radiology DG Chest 1 View  Result Date: 10/04/2021 CLINICAL DATA:  Chest pain. EXAM: CHEST  1 VIEW COMPARISON:  Chest radiograph dated Jul 05, 2021 FINDINGS: The heart size and mediastinal contours are within normal limits. Both lungs are clear. The visualized skeletal structures are unremarkable. IMPRESSION: No active disease. Electronically Signed   By: Keane Police D.O.   On: 10/04/2021 22:02    Procedures Procedures  {Document cardiac monitor, telemetry assessment procedure when appropriate:1}  Medications Ordered in ED Medications - No data to display  ED Course/ Medical Decision Making/ A&P                           Medical Decision Making   66 year old female with a history of hyperlipidemia, depression, asthma, who was seen by her primary care physician yesterday with concern for tongue swelling was found to have exudate consistent with thrush who presents with fatigue, thrush, chest pain.    Labs are completed and personally abided by me and showed no evidence of anemia, leukocytosis, no significant electrolyte abnormalities, normal kidney function.  EKG shows a bifascicular block without any previous EKGs for comparison.  Troponin is negative.  Chest x-ray was completed and personally abided by me and showed no evidence of pneumonia, pulmonary edema, pneumothorax, or widened mediastinum.    {Document critical care time when appropriate:1} {Document review of labs and clinical decision tools ie heart score, Chads2Vasc2 etc:1}  {Document your independent review of radiology images, and any outside records:1} {Document your discussion with family members, caretakers, and with consultants:1} {Document social determinants of health affecting pt's care:1} {Document your decision making why or why not admission, treatments were needed:1} Final  Clinical Impression(s) / ED Diagnoses Final diagnoses:  None    Rx / DC Orders ED Discharge Orders     None

## 2021-10-04 NOTE — ED Provider Triage Note (Signed)
Emergency Medicine Provider Triage Evaluation Note  Crystal Wong , a 66 y.o. female  was evaluated in triage.  Pt complains of chest pain, thrush, and fatigue.  Patient evaluated by PCP yesterday where CBC, CMP, TSH were drawn which were all within normal limits except mild hyperglycemia.  Patient states her thrush has worsened.  Patient was prescribed fluconazole yesterday.  Review of Systems  Positive: CP, fatigue Negative: fever  Physical Exam  BP (!) 159/82 (BP Location: Right Arm)   Pulse 77   Temp 98.3 F (36.8 C) (Oral)   Resp 18   SpO2 100%  Gen:   Awake, no distress   Resp:  Normal effort  MSK:   Moves extremities without difficulty  Other:    Medical Decision Making  Medically screening exam initiated at 9:29 PM.  Appropriate orders placed.  Crystal Wong was informed that the remainder of the evaluation will be completed by another provider, this initial triage assessment does not replace that evaluation, and the importance of remaining in the ED until their evaluation is complete.  labs   Crystal Wong 10/04/21 2135

## 2021-10-05 LAB — TROPONIN I (HIGH SENSITIVITY): Troponin I (High Sensitivity): 3 ng/L (ref <18)

## 2021-10-05 MED ORDER — NYSTATIN 100000 UNIT/ML MT SUSP: 5.0000 mL | Freq: Four times a day (QID) | OROMUCOSAL | 0 refills | Status: DC | PRN

## 2021-10-05 MED ORDER — LIDOCAINE VISCOUS HCL 2 % MT SOLN
15.0000 mL | Freq: Once | OROMUCOSAL | Status: AC
  Administered 2021-10-05: 15 mL via OROMUCOSAL
  Filled 2021-10-05: qty 15

## 2021-10-05 MED ORDER — MAGIC MOUTHWASH
5.0000 mL | Freq: Once | ORAL | Status: AC
  Administered 2021-10-05: 5 mL via ORAL
  Filled 2021-10-05: qty 5

## 2021-10-05 NOTE — ED Notes (Signed)
Patient verbalizes understanding of discharge instructions. Opportunity for questioning and answers were provided. Armband removed by staff, pt discharged from ED ambulatory.   

## 2021-10-06 ENCOUNTER — Other Ambulatory Visit: Payer: Self-pay | Admitting: Nurse Practitioner

## 2021-10-06 DIAGNOSIS — K121 Other forms of stomatitis: Secondary | ICD-10-CM | POA: Diagnosis not present

## 2021-10-06 DIAGNOSIS — J309 Allergic rhinitis, unspecified: Secondary | ICD-10-CM | POA: Diagnosis not present

## 2021-10-06 DIAGNOSIS — E782 Mixed hyperlipidemia: Secondary | ICD-10-CM

## 2021-10-06 DIAGNOSIS — K143 Hypertrophy of tongue papillae: Secondary | ICD-10-CM | POA: Diagnosis not present

## 2021-10-10 DIAGNOSIS — M79671 Pain in right foot: Secondary | ICD-10-CM | POA: Diagnosis not present

## 2021-10-20 DIAGNOSIS — S8011XA Contusion of right lower leg, initial encounter: Secondary | ICD-10-CM | POA: Diagnosis not present

## 2021-10-20 DIAGNOSIS — M25572 Pain in left ankle and joints of left foot: Secondary | ICD-10-CM | POA: Diagnosis not present

## 2021-11-02 LAB — HM DIABETES EYE EXAM

## 2021-11-22 DIAGNOSIS — L661 Lichen planopilaris: Secondary | ICD-10-CM | POA: Diagnosis not present

## 2021-11-27 ENCOUNTER — Telehealth: Payer: Self-pay | Admitting: Nurse Practitioner

## 2021-11-27 DIAGNOSIS — K2100 Gastro-esophageal reflux disease with esophagitis, without bleeding: Secondary | ICD-10-CM

## 2021-11-28 ENCOUNTER — Other Ambulatory Visit: Payer: Self-pay | Admitting: Nurse Practitioner

## 2021-11-28 DIAGNOSIS — K2100 Gastro-esophageal reflux disease with esophagitis, without bleeding: Secondary | ICD-10-CM

## 2021-11-28 NOTE — Telephone Encounter (Signed)
Pt called stating that she doesn't want her medicine switched to Pantoprazole because shes tried that in the past and it doesn't work for her.   Pt wants PA completed for Dexlansoprazole because that medicine does work for her.

## 2021-11-28 NOTE — Telephone Encounter (Signed)
pantoprazole (PROTONIX) 40 MG tablet        Changed from: dexlansoprazole (DEXILANT) 60 MG capsule   Pharmacy comment: Alternative Requested:DRUG NOT COVERED; PLEASE CALL FOR PRIOR AUTH OR CONSIDER ALTERNATIVE.   All Pharmacy Suggested Alternatives:   pantoprazole (PROTONIX) 40 MG tablet lansoprazole (PREVACID) 30 MG capsule esomeprazole (NEXIUM) 20 MG capsule RABEprazole (ACIPHEX) 20 MG tablet

## 2021-11-29 NOTE — Telephone Encounter (Signed)
lansoprazole (PREVACID) 30 MG capsule        Changed from: pantoprazole (PROTONIX) 40 MG tablet   Pharmacy comment: Alternative Requested:INSURANCE WILL ONLY PAY FOR 90 TABS EVERY 365 DAYS.   All Pharmacy Suggested Alternatives:   lansoprazole (PREVACID) 30 MG capsule esomeprazole (NEXIUM) 20 MG capsule RABEprazole (ACIPHEX) 20 MG tablet

## 2021-12-01 NOTE — Telephone Encounter (Signed)
Please do PZ for dexilant

## 2021-12-08 NOTE — Telephone Encounter (Signed)
Attempted a PA on Dexilant per patients request. Per CoverMyMeds PA is not needed and Dexilant is covered by patients insurance. Contacted [facility] to confirm and they too said she did not need a PA for the dexilant.

## 2021-12-16 ENCOUNTER — Telehealth: Payer: Self-pay | Admitting: Nurse Practitioner

## 2021-12-19 DIAGNOSIS — H9313 Tinnitus, bilateral: Secondary | ICD-10-CM | POA: Diagnosis not present

## 2021-12-19 DIAGNOSIS — J309 Allergic rhinitis, unspecified: Secondary | ICD-10-CM | POA: Diagnosis not present

## 2021-12-19 DIAGNOSIS — H9193 Unspecified hearing loss, bilateral: Secondary | ICD-10-CM | POA: Diagnosis not present

## 2021-12-20 ENCOUNTER — Telehealth: Payer: Self-pay

## 2021-12-20 NOTE — Telephone Encounter (Signed)
Sent to plan. See other phone note

## 2021-12-20 NOTE — Telephone Encounter (Signed)
Key: [id number] -   PA Case ID: [id number]   - Rx #: [id number] Need help? Call us at [phone]  Status Sent to Plan today Drug Dexlansoprazole '60MG'$  dr capsules Form [employer] Electronic PA Form [phone] NCPDP) [place] 67

## 2021-12-22 NOTE — Telephone Encounter (Signed)
DENIED  Based on the policy and the information we have, your request is denied. We did not receive any documentation that you meet any of the criteria outlined above. Formulary alternative(s) are esomeprazole capsule; lansoprazole capsule; omeprazole capsule; pantoprazole tablet; rabeprazole tablet. Requirement: 3 in a class with 3 or more alternatives, 2 in a class with 2 alternatives, or 1 in a class with only 1 alternative.

## 2021-12-22 NOTE — Telephone Encounter (Signed)
Please let patient know insurance will not pay for dexilant

## 2021-12-28 NOTE — Telephone Encounter (Signed)
Left detailed message that insurance will not cover and to contact the office with any questions

## 2022-01-09 ENCOUNTER — Other Ambulatory Visit: Payer: Self-pay | Admitting: Nurse Practitioner

## 2022-01-09 DIAGNOSIS — F411 Generalized anxiety disorder: Secondary | ICD-10-CM

## 2022-01-13 ENCOUNTER — Encounter: Payer: Self-pay | Admitting: Family

## 2022-01-13 ENCOUNTER — Ambulatory Visit: Payer: 59 | Admitting: Family

## 2022-01-13 VITALS — BP 131/71 | HR 73 | Temp 97.5°F | Ht 66.0 in | Wt 191.0 lb

## 2022-01-13 DIAGNOSIS — T7840XA Allergy, unspecified, initial encounter: Secondary | ICD-10-CM

## 2022-01-13 DIAGNOSIS — B351 Tinea unguium: Secondary | ICD-10-CM

## 2022-01-13 MED ORDER — TERBINAFINE HCL 250 MG PO TABS: 250.0000 mg | ORAL_TABLET | Freq: Every day | ORAL | 0 refills | Status: DC

## 2022-01-13 NOTE — Progress Notes (Signed)
   Subjective:    Patient ID: Crystal Wong, female    DOB: Nov 30, 1955, 66 y.o.   MRN: [id number]  Chief Complaint  Patient presents with   Allergic Reaction    To manicure from gel polish     Allergic Reaction   Pt presents to the office today for an allergic reaction. She reports she went and got a gel manicure on 01/06/22. Then on 01/09/22 she started having redness, tenderness, burning, and itching. She took the gel polish off on 01/10/22 and since her symptoms have improved.    Review of Systems  All other systems reviewed and are negative.      Objective:   Physical Exam Vitals reviewed.  Constitutional:      General: She is not in acute distress.    Appearance: She is well-developed.  HENT:     Head: Normocephalic and atraumatic.     Right Ear: Tympanic membrane normal.     Left Ear: Tympanic membrane normal.  Eyes:     Pupils: Pupils are equal, round, and reactive to light.  Neck:     Thyroid: No thyromegaly.  Cardiovascular:     Rate and Rhythm: Normal rate and regular rhythm.     Heart sounds: Normal heart sounds. No murmur heard. Pulmonary:     Effort: Pulmonary effort is normal. No respiratory distress.     Breath sounds: Normal breath sounds. No wheezing.  Abdominal:     General: Bowel sounds are normal. There is no distension.     Palpations: Abdomen is soft.     Tenderness: There is no abdominal tenderness.  Musculoskeletal:        General: No tenderness. Normal range of motion.     Cervical back: Normal range of motion and neck supple.  Skin:    General: Skin is warm and dry.     Comments: Pinky toenail thick bilateral and second toe thick  Neurological:     Mental Status: She is alert and oriented to person, place, and time.     Cranial Nerves: No cranial nerve deficit.     Deep Tendon Reflexes: Reflexes are normal and symmetric.  Psychiatric:        Behavior: Behavior normal.        Thought Content: Thought content normal.        Judgment:  Judgment normal.       BP 131/71   Pulse 73   Temp (!) 97.5 F (36.4 C) (Temporal)   Ht '5\' 6"'$  (1.676 m)   Wt 191 lb (86.6 kg)   BMI 30.83 kg/m      Assessment & Plan:  Crystal Wong comes in today with chief complaint of Allergic Reaction (To manicure from gel polish )   Diagnosis and orders addressed:  1. Allergic reaction, initial encounter Improving Continue benadryl as needed Zyrtec as needed  2. Onychomycosis Avoid walking barefoot Start Lamisil Keep follow up with PCP to monitor BP - terbinafine (LAMISIL) 250 MG tablet; Take 1 tablet (250 mg total) by mouth daily.  Dispense: 90 tablet; Refill: 0      Crystal Dun, FNP

## 2022-01-13 NOTE — Patient Instructions (Signed)
Contact Dermatitis Dermatitis is redness, soreness, and swelling (inflammation) of the skin. Contact dermatitis is a reaction to certain substances that touch the skin. There are two types of this condition: Irritant contact dermatitis. This is the most common type. It happens when something irritates your skin, such as when your hands get dry from washing them too often with soap. You can get this type of reaction even if you have not been exposed to the irritant before. Allergic contact dermatitis. This type is caused by a substance that you are allergic to, such as poison ivy. It occurs when you have been exposed to the substance (allergen) and form a sensitivity to it. In some cases, the reaction may start soon after your first exposure to the allergen. In other cases, it may not start until you are exposed to the allergen again. It may then occur every time you are exposed to the allergen in the future. What are the causes? Irritant contact dermatitis is often caused by exposure to: Makeup. Soaps, detergents, and bleaches. Acids. Metal salts, such as nickel. Allergic contact dermatitis is often caused by exposure to: Poisonous plants. Chemicals. Jewelry. Latex. Medicines. Preservatives in products, such as clothes. What increases the risk? You are more likely to get this condition if you have: A job that exposes you to irritants or allergens. Certain medical conditions. These include asthma and eczema. What are the signs or symptoms? Symptoms of this condition may occur in any place on your body that has been touched by the irritant. Symptoms include: Dryness, flaking, or cracking. Redness. Itching. Pain or a burning feeling. Blisters. Drainage of small amounts of blood or clear fluid from skin cracks. With allergic contact dermatitis, there may also be swelling in areas such as the eyelids, mouth, or genitals. How is this diagnosed? This condition is diagnosed with a medical  history and physical exam. A patch skin test may be done to help figure out the cause. If the condition is related to your job, you may need to see an expert in health problems in the workplace (occupational medicine specialist). How is this treated? This condition is treated by staying away from the cause of the reaction and protecting your skin from further contact. Treatment may also include: Steroid creams or ointments. Steroid medicines may need be taken by mouth (orally) in more severe cases. Antibiotics or medicines applied to the skin to kill bacteria (antibacterial ointments). These may be needed if a skin infection is present. Antihistamines. These may be taken orally or put on as a lotion to ease itching. A bandage (dressing). Follow these instructions at home: Skin care Moisturize your skin as needed. Put cool, wet cloths (cool compresses) on the affected areas. Try applying baking soda paste to your skin. Stir water into baking soda until it has the consistency of a paste. Do not scratch your skin. Avoid friction to the affected area. Avoid the use of soaps, perfumes, and dyes. Check the affected areas every day for signs of infection. Check for: More redness, swelling, or pain. More fluid or blood. Warmth. Pus or a bad smell. Medicines Take or apply over-the-counter and prescription medicines only as told by your health care provider. If you were prescribed antibiotics, take or apply them as told by your health care provider. Do not stop using the antibiotic even if you start to feel better. Bathing Try taking a bath with: Epsom salts. Follow the instructions on the packaging. You can get these at your local pharmacy   or grocery store. Baking soda. Pour a small amount into the bath as told by your health care provider. Colloidal oatmeal. Follow the instructions on the packaging. You can get this at your local pharmacy or grocery store. Bathe less often. This may mean bathing  every other day. Bathe in lukewarm water. Avoid using hot water. Bandage care If you were given a dressing, change it as told by your health care provider. Wash your hands with soap and water for at least 20 seconds before and after you change your dressing. If soap and water are not available, use hand sanitizer. General instructions Avoid the substance that caused your reaction. If you do not know what caused it, keep a journal to try to track what caused it. Write down: What you eat and drink. What cosmetics you use. What you wear in the affected area. This includes jewelry. Contact a health care provider if: Your condition does not get better with treatment. Your condition gets worse. You have any signs of infection. You have a fever. You have new symptoms. Your bone or joint under the affected area becomes painful after the skin has healed. Get help right away if: You notice red streaks coming from the affected area. The affected area turns darker. You have trouble breathing. This information is not intended to replace advice given to you by your health care provider. Make sure you discuss any questions you have with your health care provider. Document Revised: 08/26/2021 Document Reviewed: 08/26/2021 Elsevier Patient Education  2023 [employer]  

## 2022-01-16 IMAGING — MG MM DIGITAL SCREENING BILAT W/ TOMO AND CAD
8 series · 8 of 24 positions shown · non-contrast
Comparison: Previous exam(s).

CLINICAL DATA: Screening.

EXAM:
DIGITAL SCREENING BILATERAL MAMMOGRAM WITH TOMOSYNTHESIS AND CAD
TECHNIQUE: Bilateral screening digital craniocaudal and mediolateral oblique
mammograms were obtained. Bilateral screening digital breast
tomosynthesis was performed. The images were evaluated with
computer-aided detection.

[L CC synth-2D]
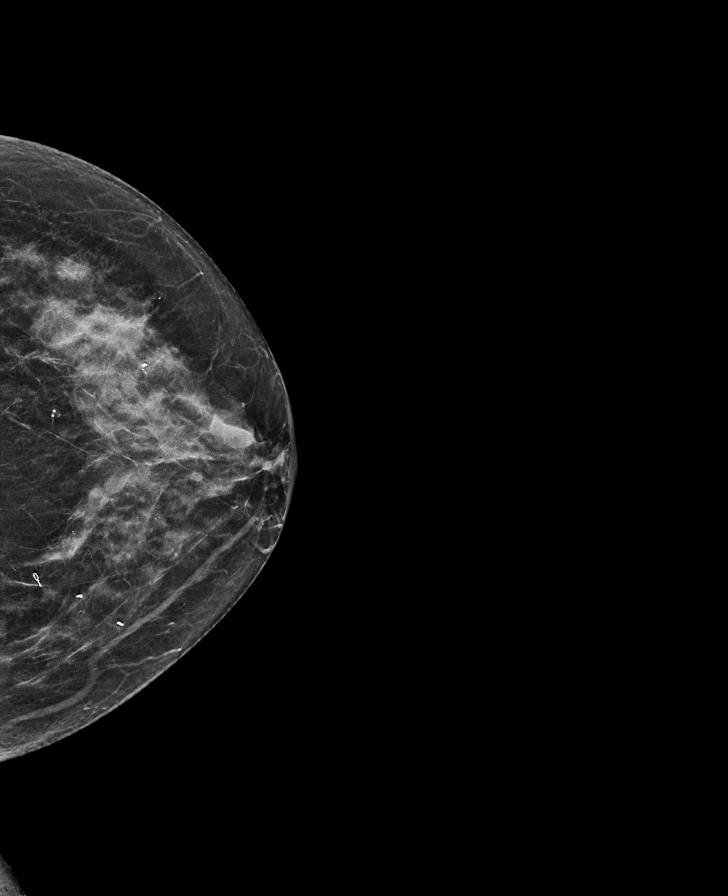

[R MLO synth-2D]
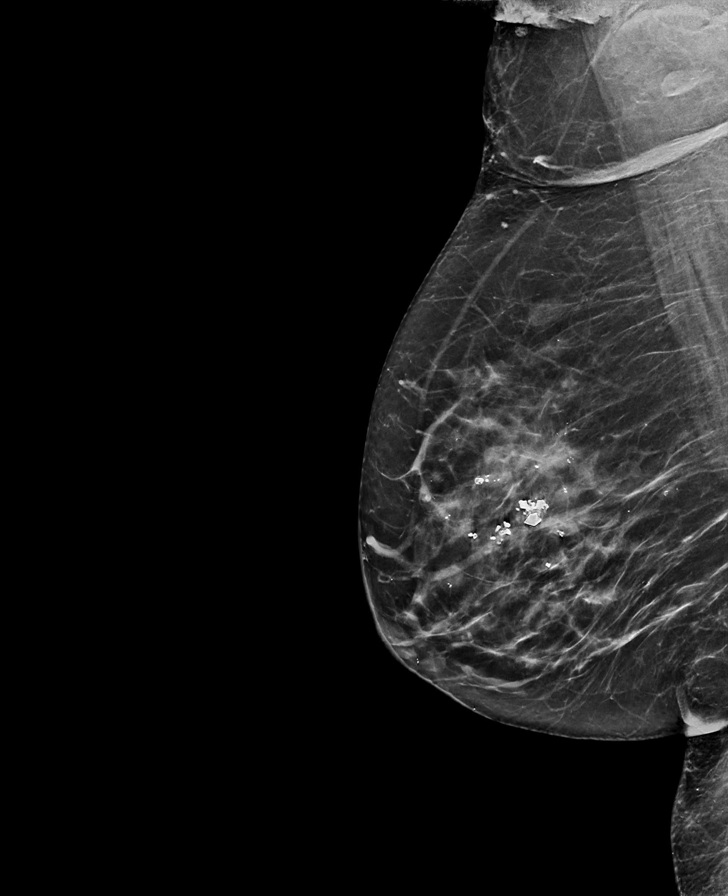

[L MLO synth-2D]
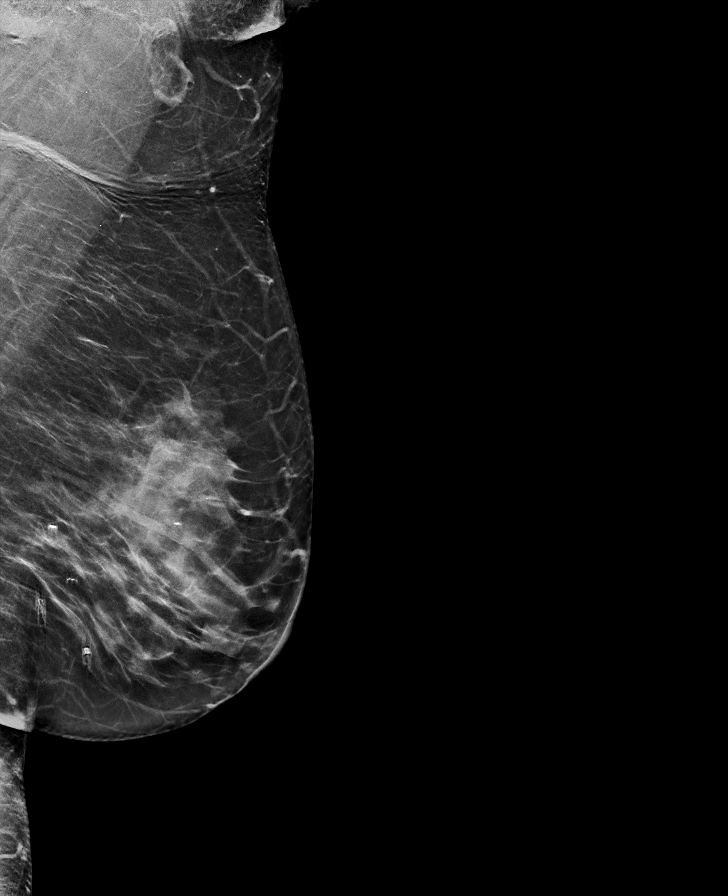

[R CC synth-2D]
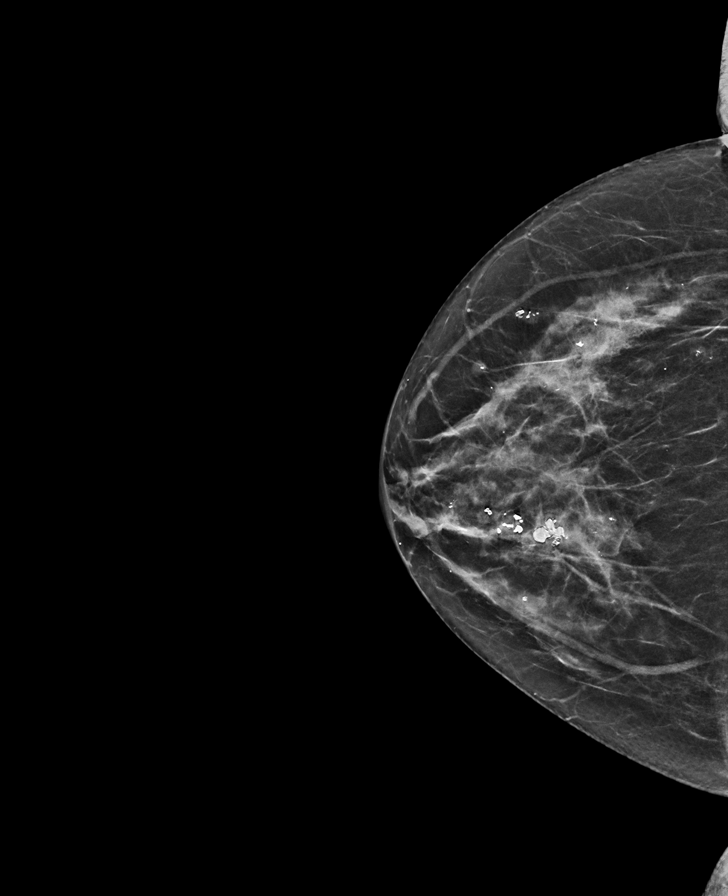

[L MLO tomo · tomo slice 35/69.0]
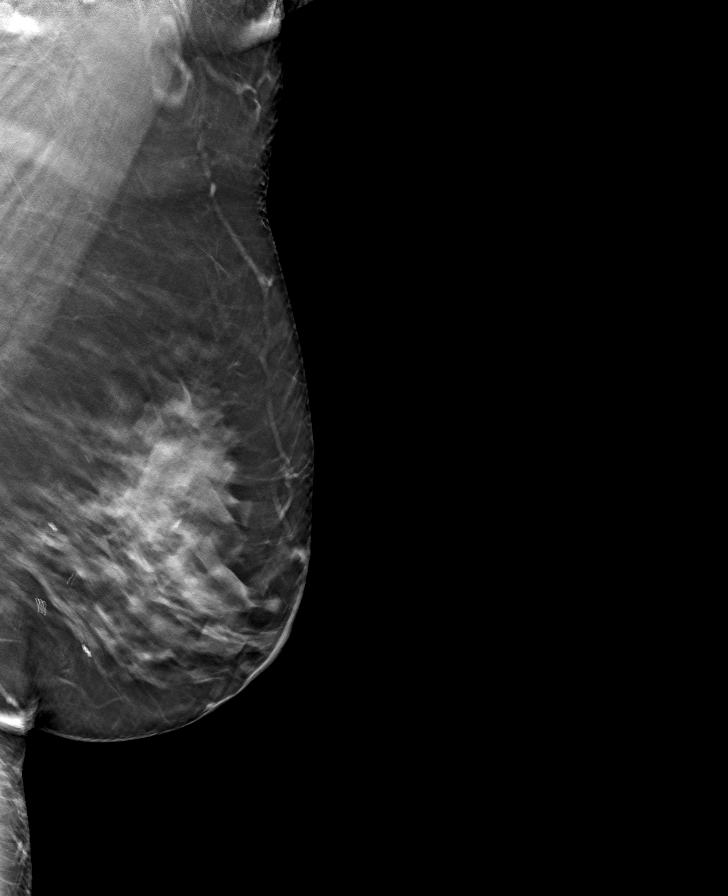

[R CC tomo · tomo slice 27/53.0]
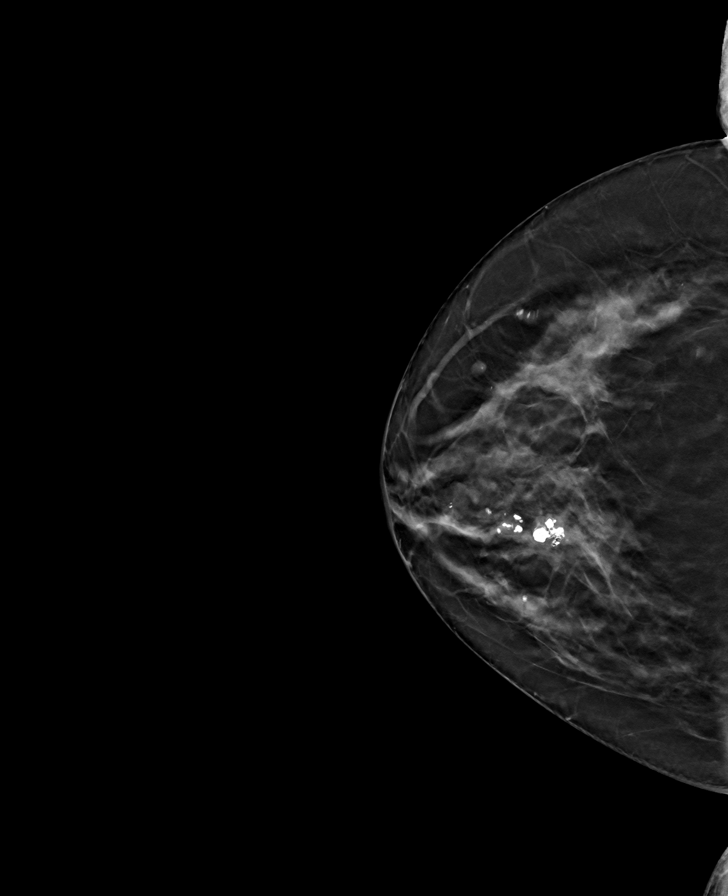

[R MLO tomo · tomo slice 32/63.0]
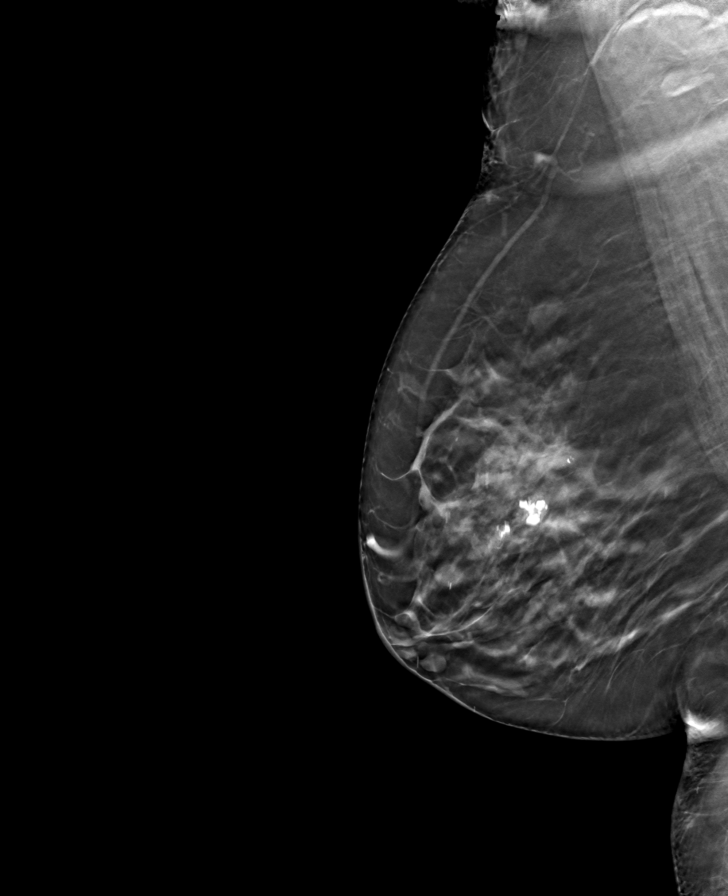

[L CC tomo · tomo slice 27/54.0]
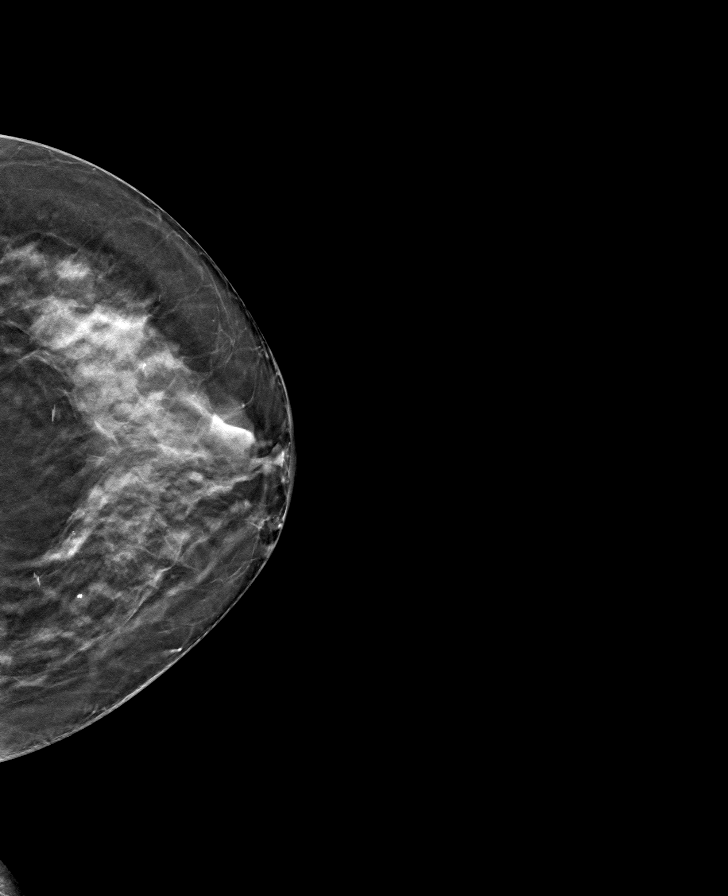

[8 of 24 positions shown; findings below may reference images not displayed]

ACR Breast Density Category c: The breast tissue is heterogeneously
dense, which may obscure small masses.
FINDINGS: In the left breast, a possible asymmetry warrants further
evaluation. In the right breast, no findings suspicious for
malignancy.
IMPRESSION: Further evaluation is suggested for possible asymmetry in the left
breast.

RECOMMENDATION:
Diagnostic mammogram and possibly ultrasound of the left breast.
(Code:[id number])

The patient will be contacted regarding the findings, and additional
imaging will be scheduled.

BI-RADS CATEGORY  0: Incomplete. Need additional imaging evaluation
and/or prior mammograms for comparison.

## 2022-02-03 ENCOUNTER — Other Ambulatory Visit: Payer: Self-pay | Admitting: Nurse Practitioner

## 2022-02-03 DIAGNOSIS — F3342 Major depressive disorder, recurrent, in full remission: Secondary | ICD-10-CM

## 2022-02-16 ENCOUNTER — Encounter: Payer: Self-pay | Admitting: Family Medicine

## 2022-02-16 ENCOUNTER — Ambulatory Visit: Payer: 59 | Admitting: Family Medicine

## 2022-02-16 VITALS — BP 131/72 | HR 73 | Temp 98.4°F | Ht 66.0 in | Wt 194.4 lb

## 2022-02-16 DIAGNOSIS — H9201 Otalgia, right ear: Secondary | ICD-10-CM

## 2022-02-16 MED ORDER — NEOMYCIN-POLYMYXIN-HC 3.5-10000-1 OT SUSP: 3.0000 [drp] | Freq: Three times a day (TID) | OTIC | 0 refills | Status: AC

## 2022-02-16 NOTE — Patient Instructions (Signed)
Earache, Adult An earache, or ear pain, can be caused by many things, including: An infection. Ear wax buildup. Ear pressure. Something in the ear that should not be there (foreign body). A sore throat. Tooth problems. Jaw problems. Treatment of the earache will depend on the cause. If the cause is not clear or cannot be known, you may need to watch your symptoms until your earache goes away or until a cause is found. Follow these instructions at home: Medicines Take or apply over-the-counter and prescription medicines only as told by your health care provider. If you were prescribed antibiotics, use them as told by your health care provider. Do not stop using the antibiotic even if you start to feel better. Do not put anything in your ear other than medicine that is prescribed by your health care provider. Managing pain     If directed, apply heat to the affected area as often as told by your health care provider. Use the heat source that your health care provider recommends, such as a moist heat pack or a heating pad. Place a towel between your skin and the heat source. Leave the heat on for 20-30 minutes. If your skin turns bright red, remove the heat right away to prevent burns. The risk of burns is higher if you cannot feel pain, heat, or cold. If directed, put ice on the affected area. To do this: Put ice in a plastic bag. Place a towel between your skin and the bag. Leave the ice on for 20 minutes, 2-3 times a day. If your skin turns bright red, remove the ice right away to prevent skin damage. The risk of skin damage is higher if you cannot feel pain, heat, or cold.  General instructions Pay attention to any changes in your symptoms. Try resting in an upright position instead of lying down. This may help to reduce pressure in your ear and relieve pain. Chew gum if it helps to relieve your ear pain. Treat any allergies as told by your health care provider. Drink enough fluid  to keep your urine pale yellow. It is up to you to get the results of any tests that were done. Ask your health care provider, or the department that is doing the tests, when your results will be ready. Contact a health care provider if: Your pain does not improve within 2 days. Your earache gets worse. You have new symptoms. You have a fever. Get help right away if: You have a severe headache. You have a stiff neck. You have trouble swallowing. You have redness or swelling behind your ear. You have fluid or blood coming from your ear. You have hearing loss. You feel dizzy. This information is not intended to replace advice given to you by your health care provider. Make sure you discuss any questions you have with your health care provider. Document Revised: 07/04/2021 Document Reviewed: 07/04/2021 Elsevier Patient Education  [place].

## 2022-02-16 NOTE — Progress Notes (Signed)
   Acute Office Visit  Subjective:     Patient ID: Crystal Wong, female    DOB: [birth year], 66 y.o.   MRN: [id number]  Chief Complaint  Patient presents with   Otalgia    Otalgia  There is pain in the right ear. This is a new problem. Episode onset: 2 weeks. The problem occurs every few hours. The problem has been waxing and waning. There has been no fever. Pertinent negatives include no coughing, ear discharge, hearing loss, rhinorrhea or sore throat. She has tried acetaminophen (antihistamines) for the symptoms. The treatment provided mild relief.   She has had a lot of popping in her ear recently, this worsened, along with the pain after being on a flight recently. She has been using tissues to clean her ears.   Review of Systems  HENT:  Positive for ear pain. Negative for ear discharge, hearing loss, rhinorrhea and sore throat.   Respiratory:  Negative for cough.         Objective:    BP 131/72   Pulse 73   Temp 98.4 F (36.9 C) (Temporal)   Ht '5\' 6"'$  (1.676 m)   Wt 194 lb 6 oz (88.2 kg)   SpO2 98%   BMI 31.37 kg/m    Physical Exam Vitals and nursing note reviewed.  Constitutional:      General: She is not in acute distress.    Appearance: She is not ill-appearing, toxic-appearing or diaphoretic.  HENT:     Right Ear: External ear normal. Tenderness (mild tenderness of canal) present. No drainage or swelling. There is no impacted cerumen. No foreign body. No mastoid tenderness. Tympanic membrane is not injected, scarred, perforated, erythematous, retracted or bulging.     Left Ear: Tympanic membrane, ear canal and external ear normal. There is no impacted cerumen. No foreign body. No mastoid tenderness.  Pulmonary:     Effort: Pulmonary effort is normal. No respiratory distress.  Skin:    General: Skin is warm and dry.  Neurological:     General: No focal deficit present.     Mental Status: She is alert and oriented to person, place, and time.  Psychiatric:         Mood and Affect: Mood normal.        Behavior: Behavior normal.     No results found for any visits on 02/16/22.      Assessment & Plan:   Crystal Wong was seen today for otalgia.  Diagnoses and all orders for this visit:  Otalgia of right ear With some tenderness of canal. Normal TM. Discuss flonase OTC. If no improvement, start cortisporin as below.  -     neomycin-polymyxin-hydrocortisone (CORTISPORIN) 3.5-10000-1 OTIC suspension; Place 3 drops into the right ear 3 (three) times daily for 7 days.  Return if symptoms worsen or fail to improve.  The patient indicates understanding of these issues and agrees with the plan.  Gwenlyn Perking, FNP

## 2022-03-02 ENCOUNTER — Ambulatory Visit: Payer: 59 | Admitting: Nurse Practitioner

## 2022-03-03 ENCOUNTER — Other Ambulatory Visit: Payer: Self-pay | Admitting: Nurse Practitioner

## 2022-03-03 ENCOUNTER — Telehealth: Payer: Self-pay | Admitting: Pharmacist Clinician (PhC)/ Clinical Pharmacy Specialist

## 2022-03-03 DIAGNOSIS — F411 Generalized anxiety disorder: Secondary | ICD-10-CM

## 2022-03-03 NOTE — Telephone Encounter (Signed)
Patient will need labs w/in 6 months for January renewal of PCSK9 inhibitor.  LM with husband to have patient call back

## 2022-03-06 ENCOUNTER — Other Ambulatory Visit: Payer: Self-pay | Admitting: Nurse Practitioner

## 2022-03-06 DIAGNOSIS — F3342 Major depressive disorder, recurrent, in full remission: Secondary | ICD-10-CM

## 2022-03-07 ENCOUNTER — Other Ambulatory Visit (HOSPITAL_COMMUNITY)
Admission: RE | Admit: 2022-03-07 | Discharge: 2022-03-07 | Disposition: A | Discharge status: Home / Self Care | Payer: 59 | Source: Ambulatory Visit | Attending: Nurse Practitioner | Admitting: Nurse Practitioner

## 2022-03-07 ENCOUNTER — Encounter: Payer: Self-pay | Admitting: Nurse Practitioner

## 2022-03-07 ENCOUNTER — Ambulatory Visit (INDEPENDENT_AMBULATORY_CARE_PROVIDER_SITE_OTHER): Payer: 59 | Admitting: Nurse Practitioner

## 2022-03-07 VITALS — BP 127/73 | HR 75 | Temp 97.9°F | Resp 20 | Ht 66.0 in | Wt 192.0 lb

## 2022-03-07 DIAGNOSIS — Z0000 Encounter for general adult medical examination without abnormal findings: Secondary | ICD-10-CM | POA: Diagnosis not present

## 2022-03-07 DIAGNOSIS — K2100 Gastro-esophageal reflux disease with esophagitis, without bleeding: Secondary | ICD-10-CM | POA: Diagnosis not present

## 2022-03-07 DIAGNOSIS — Z0001 Encounter for general adult medical examination with abnormal findings: Secondary | ICD-10-CM | POA: Diagnosis not present

## 2022-03-07 DIAGNOSIS — F5101 Primary insomnia: Secondary | ICD-10-CM

## 2022-03-07 DIAGNOSIS — R739 Hyperglycemia, unspecified: Secondary | ICD-10-CM

## 2022-03-07 DIAGNOSIS — E782 Mixed hyperlipidemia: Secondary | ICD-10-CM | POA: Diagnosis not present

## 2022-03-07 DIAGNOSIS — Z6833 Body mass index (BMI) 33.0-33.9, adult: Secondary | ICD-10-CM | POA: Diagnosis not present

## 2022-03-07 DIAGNOSIS — R69 Illness, unspecified: Secondary | ICD-10-CM | POA: Diagnosis not present

## 2022-03-07 DIAGNOSIS — F3342 Major depressive disorder, recurrent, in full remission: Secondary | ICD-10-CM

## 2022-03-07 DIAGNOSIS — F411 Generalized anxiety disorder: Secondary | ICD-10-CM

## 2022-03-07 LAB — LIPID PANEL

## 2022-03-07 MED ORDER — FAMOTIDINE 20 MG PO TABS: 20.0000 mg | ORAL_TABLET | Freq: Two times a day (BID) | ORAL | 1 refills | Status: DC

## 2022-03-07 MED ORDER — LORAZEPAM 1 MG PO TABS: 1.0000 mg | ORAL_TABLET | Freq: Three times a day (TID) | ORAL | 5 refills | Status: DC | PRN

## 2022-03-07 MED ORDER — LANSOPRAZOLE 30 MG PO CPDR: 30.0000 mg | DELAYED_RELEASE_CAPSULE | Freq: Two times a day (BID) | ORAL | 2 refills | Status: DC

## 2022-03-07 MED ORDER — ROSUVASTATIN CALCIUM 40 MG PO TABS: 40.0000 mg | ORAL_TABLET | Freq: Every day | ORAL | 1 refills | Status: DC

## 2022-03-07 MED ORDER — CITALOPRAM HYDROBROMIDE 40 MG PO TABS: 40.0000 mg | ORAL_TABLET | Freq: Every day | ORAL | 1 refills | Status: DC

## 2022-03-07 NOTE — Patient Instructions (Signed)
Insomnia Insomnia is a sleep disorder that makes it difficult to fall asleep or stay asleep. Insomnia can cause fatigue, low energy, difficulty concentrating, mood swings, and poor performance at work or school. There are three different ways to classify insomnia: Difficulty falling asleep. Difficulty staying asleep. Waking up too early in the morning. Any type of insomnia can be long-term (chronic) or short-term (acute). Both are common. Short-term insomnia usually lasts for 3 months or less. Chronic insomnia occurs at least three times a week for longer than 3 months. What are the causes? Insomnia may be caused by another condition, situation, or substance, such as: Having certain mental health conditions, such as anxiety and depression. Using caffeine, alcohol, tobacco, or drugs. Having gastrointestinal conditions, such as gastroesophageal reflux disease (GERD). Having certain medical conditions. These include: Asthma. Alzheimer's disease. Stroke. Chronic pain. An overactive thyroid gland (hyperthyroidism). Other sleep disorders, such as restless legs syndrome and sleep apnea. Menopause. Sometimes, the cause of insomnia may not be known. What increases the risk? Risk factors for insomnia include: Gender. Females are affected more often than males. Age. Insomnia is more common as people get older. Stress and certain medical and mental health conditions. Lack of exercise. Having an irregular work schedule. This may include working night shifts and traveling between different time zones. What are the signs or symptoms? If you have insomnia, the main symptom is having trouble falling asleep or having trouble staying asleep. This may lead to other symptoms, such as: Feeling tired or having low energy. Feeling nervous about going to sleep. Not feeling rested in the morning. Having trouble concentrating. Feeling irritable, anxious, or depressed. How is this diagnosed? This condition  may be diagnosed based on: Your symptoms and medical history. Your health care provider may ask about: Your sleep habits. Any medical conditions you have. Your mental health. A physical exam. How is this treated? Treatment for insomnia depends on the cause. Treatment may focus on treating an underlying condition that is causing the insomnia. Treatment may also include: Medicines to help you sleep. Counseling or therapy. Lifestyle adjustments to help you sleep better. Follow these instructions at home: Eating and drinking  Limit or avoid alcohol, caffeinated beverages, and products that contain nicotine and tobacco, especially close to bedtime. These can disrupt your sleep. Do not eat a large meal or eat spicy foods right before bedtime. This can lead to digestive discomfort that can make it hard for you to sleep. Sleep habits  Keep a sleep diary to help you and your health care provider figure out what could be causing your insomnia. Write down: When you sleep. When you wake up during the night. How well you sleep and how rested you feel the next day. Any side effects of medicines you are taking. What you eat and drink. Make your bedroom a dark, comfortable place where it is easy to fall asleep. Put up shades or blackout curtains to block light from outside. Use a white noise machine to block noise. Keep the temperature cool. Limit screen use before bedtime. This includes: Not watching TV. Not using your smartphone, tablet, or computer. Stick to a routine that includes going to bed and waking up at the same times every day and night. This can help you fall asleep faster. Consider making a quiet activity, such as reading, part of your nighttime routine. Try to avoid taking naps during the day so that you sleep better at night. Get out of bed if you are still awake after   15 minutes of trying to sleep. Keep the lights down, but try reading or doing a quiet activity. When you feel  sleepy, go back to bed. General instructions Take over-the-counter and prescription medicines only as told by your health care provider. Exercise regularly as told by your health care provider. However, avoid exercising in the hours right before bedtime. Use relaxation techniques to manage stress. Ask your health care provider to suggest some techniques that may work well for you. These may include: Breathing exercises. Routines to release muscle tension. Visualizing peaceful scenes. Make sure that you drive carefully. Do not drive if you feel very sleepy. Keep all follow-up visits. This is important. Contact a health care provider if: You are tired throughout the day. You have trouble in your daily routine due to sleepiness. You continue to have sleep problems, or your sleep problems get worse. Get help right away if: You have thoughts about hurting yourself or someone else. Get help right away if you feel like you may hurt yourself or others, or have thoughts about taking your own life. Go to your nearest emergency room or: Call 911. Call the National Suicide Prevention Lifeline at 1-800-273-8255 or 988. This is open 24 hours a day. Text the Crisis Text Line at 741741. Summary Insomnia is a sleep disorder that makes it difficult to fall asleep or stay asleep. Insomnia can be long-term (chronic) or short-term (acute). Treatment for insomnia depends on the cause. Treatment may focus on treating an underlying condition that is causing the insomnia. Keep a sleep diary to help you and your health care provider figure out what could be causing your insomnia. This information is not intended to replace advice given to you by your health care provider. Make sure you discuss any questions you have with your health care provider. Document Revised: 01/31/2021 Document Reviewed: 01/31/2021 Elsevier Patient Education  2023 [employer]  

## 2022-03-07 NOTE — Progress Notes (Signed)
Subjective:    Patient ID: Crystal Wong, female    DOB: 1956/02/26, 67 y.o.   MRN: [id number]   Chief Complaint: medical management of chronic issues     HPI:  Crystal Wong is a 67 y.o. who identifies as a female who was assigned female at birth.   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 Annual physical exam PAP today. Menopausal for greater then 10 years- had hysterectomy  2. Mixed hyperlipidemia Does try to watch diet but does little to no dedicated exercise. Is on praulent and crestor. Lab Results  Component Value Date   CHOL 167 08/30/2021   HDL 63 08/30/2021   LDLCALC 86 08/30/2021   TRIG 100 08/30/2021   CHOLHDL 2.7 08/30/2021     3. Gastroesophageal reflux disease with esophagitis without hemorrhage Is on prevacid and pepcid- combination works well for her.  4. Recurrent major depressive disorder, in full remission ([place]) Is on celexa daily and is doing well.  5. GAD (generalized anxiety disorder) Is on ativan for prn    02/16/2022    9:39 AM 09/20/2021    9:27 AM 08/30/2021   11:38 AM 08/29/2021    9:04 AM  GAD 7 : Generalized Anxiety Score  Nervous, Anxious, on Edge 0 0 0 0  Control/stop worrying 0 0 0 1  Worry too much - different things 0 0 0 0  Trouble relaxing 0 0 0 0  Restless 0 0 0 0  Easily annoyed or irritable 0 0 0 0  Afraid - awful might happen 0 0 0 0  Total GAD 7 Score 0 0 0 1  Anxiety Difficulty Not difficult at all Not difficult at all Not difficult at all Somewhat difficult      6. Primary insomnia Uses melatonin OTC. Sleeps about **8 hours*  7. BMI 33.0-33.9,adult Weight is down 2lbs Wt Readings from Last 3 Encounters:  03/07/22 192 lb (87.1 kg)  02/16/22 194 lb 6 oz (88.2 kg)  01/13/22 191 lb (86.6 kg)   BMI Readings from Last 3 Encounters:  03/07/22 30.99 kg/m  02/16/22 31.37 kg/m  01/13/22 30.83 kg/m       New complaints: None today  Allergies  Allergen Reactions   Sulfa Antibiotics Anaphylaxis   Sulfamethoxazole-Trimethoprim Swelling   Elemental Sulfur Other (See Comments)    Other reaction(s): Angioedema (ALLERGY/intolerance) Other reaction(s): Angioedema (ALLERGY/intolerance)   Outpatient Encounter Medications as of 03/07/2022  Medication Sig   albuterol (VENTOLIN HFA) 108 (90 Base) MCG/ACT inhaler 2 puff q6 prn   betamethasone dipropionate 0.05 % lotion APPLY 1 APPLICATION TO THE SCALP NIGHTLY   budesonide-formoterol (SYMBICORT) 80-4.5 MCG/ACT inhaler Take 2 puffs first thing in am and then another 2 puffs about 12 hours later. (Patient not taking: Reported on 02/16/2022)   citalopram (CELEXA) 40 MG tablet Take 1 tablet (40 mg total) by mouth daily. (NEEDS TO BE SEEN BEFORE NEXT REFILL)   doxycycline (VIBRAMYCIN) 100 MG capsule    dutasteride (AVODART) 0.5 MG capsule    famotidine (PEPCID) 20 MG tablet Take 1 tablet (20 mg total) by mouth 2 (two) times daily. (Patient not taking: Reported on 02/16/2022)   fluticasone (FLONASE) 50 MCG/ACT nasal spray Place 2 sprays into both nostrils daily.   hydrocortisone (ANUSOL-HC) 25 MG suppository PLACE 1 SUPPOSITORY (25 MG TOTAL) RECTALLY 2 (TWO) TIMES DAILY.   lansoprazole (PREVACID) 30 MG capsule Take  1 capsule (30 mg total) by mouth 2 (two) times daily before a meal. (Patient not taking: Reported on 02/16/2022)   LORATADINE-D 24HR 10-240 MG 24 hr tablet Take 1 tablet by mouth once daily   LORazepam (ATIVAN) 1 MG tablet TAKE 1 TABLET BY MOUTH EVERY 8 HOURS AS NEEDED.   melatonin 5 MG TABS Take 5 mg by mouth at bedtime.   PRALUENT 150 MG/ML SOAJ INJECT 150 MG INTO THE SKIN EVERY 14 (FOURTEEN) DAYS.   rosuvastatin (CRESTOR) 40 MG tablet Take 1 tablet (40 mg total) by mouth daily.   terbinafine (LAMISIL) 250 MG tablet Take 1 tablet (250 mg total) by mouth daily.   VITAMIN D, CHOLECALCIFEROL, PO Take 1 tablet by mouth daily.    No  facility-administered encounter medications on file as of 03/07/2022.    Past Surgical History:  Procedure Laterality Date   ANKLE FRACTURE SURGERY Left    Pins and screws   BREAST LUMPECTOMY Bilateral    left x 1, right x 2   CARPAL TUNNEL RELEASE Right    CARPAL TUNNEL RELEASE Left    COLONOSCOPY     KNEE ARTHROSCOPY Bilateral    PARTIAL HYSTERECTOMY      Family History  Problem Relation Age of Onset   Heart disease Mother    Diabetes Mother    Colon polyps Mother    Colon cancer Father 82   Gallbladder disease Daughter    Gallstones Paternal Grandmother    Esophageal cancer Neg Hx    Stomach cancer Neg Hx    Rectal cancer Neg Hx    Breast cancer Neg Hx       Controlled substance contract: n/a     Review of Systems  Constitutional:  Negative for diaphoresis.  Eyes:  Negative for pain.  Respiratory:  Negative for shortness of breath.   Cardiovascular:  Negative for chest pain, palpitations and leg swelling.  Gastrointestinal:  Negative for abdominal pain.  Endocrine: Negative for polydipsia.  Skin:  Negative for rash.  Neurological:  Negative for dizziness, weakness and headaches.  Hematological:  Does not bruise/bleed easily.  All other systems reviewed and are negative.      Objective:   Physical Exam Vitals and nursing note reviewed.  Constitutional:      General: She is not in acute distress.    Appearance: Normal appearance. She is well-developed.  HENT:     Head: Normocephalic.     Right Ear: Tympanic membrane normal.     Left Ear: Tympanic membrane normal.     Nose: Nose normal.     Mouth/Throat:     Mouth: Mucous membranes are moist.  Eyes:     Pupils: Pupils are equal, round, and reactive to light.  Neck:     Vascular: No carotid bruit or JVD.  Cardiovascular:     Rate and Rhythm: Normal rate and regular rhythm.     Heart sounds: Normal heart sounds.  Pulmonary:     Effort: Pulmonary effort is normal. No respiratory distress.     Breath  sounds: Normal breath sounds. No wheezing or rales.  Chest:     Chest wall: No tenderness.  Abdominal:     General: Bowel sounds are normal. There is no distension or abdominal bruit.     Palpations: Abdomen is soft. There is no hepatomegaly, splenomegaly, mass or pulsatile mass.     Tenderness: There is no abdominal tenderness.  Genitourinary:    General: Normal vulva.       Vagina: No vaginal discharge.     Comments: Vaginal cuff intact No adnexal masses or tenderness External hemorrhoids Musculoskeletal:        General: Normal range of motion.     Cervical back: Normal range of motion and neck supple.  Lymphadenopathy:     Cervical: No cervical adenopathy.  Skin:    General: Skin is warm and dry.  Neurological:     Mental Status: She is alert and oriented to person, place, and time.     Deep Tendon Reflexes: Reflexes are normal and symmetric.  Psychiatric:        Behavior: Behavior normal.        Thought Content: Thought content normal.        Judgment: Judgment normal.     BP 127/73   Pulse 75   Temp 97.9 F (36.6 C) (Temporal)   Resp 20   Ht 5' 6" (1.676 m)   Wt 192 lb (87.1 kg)   SpO2 96%   BMI 30.99 kg/m        Assessment & Plan:  Crystal Wong comes in today with chief complaint of Annual Exam   Diagnosis and orders addressed:  1. Annual physical exam  - CBC with Differential/Platelet - CMP14+EGFR - Thyroid Panel With TSH - VITAMIN D 25 Hydroxy (Vit-D Deficiency, Fractures) - Cytology - PAP  2. Mixed hyperlipidemia Low fat diet - Lipid panel - rosuvastatin (CRESTOR) 40 MG tablet; Take 1 tablet (40 mg total) by mouth daily.  Dispense: 90 tablet; Refill: 1  3. Gastroesophageal reflux disease with esophagitis without hemorrhage Avoid spicy foods Do not eat 2 hours prior to bedtime - lansoprazole (PREVACID) 30 MG capsule; Take 1 capsule (30 mg total) by mouth 2 (two) times daily before a meal.  Dispense: 60 capsule; Refill: 2 - famotidine (PEPCID)  20 MG tablet; Take 1 tablet (20 mg total) by mouth 2 (two) times daily.  Dispense: 180 tablet; Refill: 1  4. Recurrent major depressive disorder, in full remission ([place]) Stress management - citalopram (CELEXA) 40 MG tablet; Take 1 tablet (40 mg total) by mouth daily.  Dispense: 90 tablet; Refill: 1  5. GAD (generalized anxiety disorder) Bedtime routine - ToxASSURE Select 13 (MW), Urine - LORazepam (ATIVAN) 1 MG tablet; Take 1 tablet (1 mg total) by mouth every 8 (eight) hours as needed.  Dispense: 30 tablet; Refill: 5  6. Primary insomnia Bedtime routine  7. BMI 33.0-33.9,adult Discussed diet and exercise for person with BMI >25 Will recheck weight in 3-6 months    Labs pending Health Maintenance reviewed Diet and exercise encouraged  Follow up plan: 6 months   [name] Done, FNP

## 2022-03-08 LAB — CMP14+EGFR
ALT: 19 IU/L (ref 0–32)
AST: 21 IU/L (ref 0–40)
Albumin/Globulin Ratio: 1.8 (ref 1.2–2.2)
Albumin: 4.4 g/dL (ref 3.9–4.9)
Alkaline Phosphatase: 80 IU/L (ref 44–121)
BUN/Creatinine Ratio: 16 (ref 12–28)
BUN: 15 mg/dL (ref 8–27)
Bilirubin Total: 0.3 mg/dL (ref 0.0–1.2)
CO2: 22 mmol/L (ref 20–29)
Calcium: 10 mg/dL (ref 8.7–10.3)
Chloride: 103 mmol/L (ref 96–106)
Creatinine, Ser: 0.94 mg/dL (ref 0.57–1.00)
Globulin, Total: 2.5 g/dL (ref 1.5–4.5)
Glucose: 119 mg/dL — ABNORMAL HIGH (ref 70–99)
Potassium: 4.4 mmol/L (ref 3.5–5.2)
Sodium: 140 mmol/L (ref 134–144)
Total Protein: 6.9 g/dL (ref 6.0–8.5)
eGFR: 68 mL/min/{1.73_m2} (ref >59)

## 2022-03-08 LAB — CBC WITH DIFFERENTIAL/PLATELET
Basophils Absolute: 0.1 10*3/uL (ref 0.0–0.2)
Basos: 1 %
EOS (ABSOLUTE): 0.1 10*3/uL (ref 0.0–0.4)
Eos: 2 %
Hematocrit: 41.3 % (ref 34.0–46.6)
Hemoglobin: 13.8 g/dL (ref 11.1–15.9)
Immature Grans (Abs): 0 10*3/uL (ref 0.0–0.1)
Immature Granulocytes: 0 %
Lymphocytes Absolute: 1.5 10*3/uL (ref 0.7–3.1)
Lymphs: 26 %
MCH: 29.2 pg (ref 26.6–33.0)
MCHC: 33.4 g/dL (ref 31.5–35.7)
MCV: 87 fL (ref 79–97)
Monocytes Absolute: 0.3 10*3/uL (ref 0.1–0.9)
Monocytes: 6 %
Neutrophils Absolute: 3.7 10*3/uL (ref 1.4–7.0)
Neutrophils: 65 %
Platelets: 247 10*3/uL (ref 150–450)
RBC: 4.73 x10E6/uL (ref 3.77–5.28)
RDW: 13.3 % (ref 11.7–15.4)
WBC: 5.8 10*3/uL (ref 3.4–10.8)

## 2022-03-08 LAB — LIPID PANEL
Chol/HDL Ratio: 2.4 ratio (ref 0.0–4.4)
Cholesterol, Total: 179 mg/dL (ref 100–199)
HDL: 75 mg/dL (ref >39)
LDL Chol Calc (NIH): 84 mg/dL (ref 0–99)
Triglycerides: 114 mg/dL (ref 0–149)
VLDL Cholesterol Cal: 20 mg/dL (ref 5–40)

## 2022-03-08 LAB — TOXASSURE SELECT 13 (MW), URINE

## 2022-03-08 LAB — THYROID PANEL WITH TSH
Free Thyroxine Index: 2.2 (ref 1.2–4.9)
T3 Uptake Ratio: 27 % (ref 24–39)
T4, Total: 8.1 ug/dL (ref 4.5–12.0)
TSH: 1.68 u[IU]/mL (ref 0.450–4.500)

## 2022-03-08 LAB — VITAMIN D 25 HYDROXY (VIT D DEFICIENCY, FRACTURES): Vit D, 25-Hydroxy: 45.1 ng/mL (ref 30.0–100.0)

## 2022-03-08 LAB — CYTOLOGY - PAP: Diagnosis: NEGATIVE

## 2022-03-08 NOTE — Telephone Encounter (Signed)
Assured patient lipid labs were unchanged and we should be able to renew [id number] without problem.

## 2022-03-08 NOTE — Addendum Note (Signed)
Addended by: Chevis Pretty on: 03/08/2022 07:52 AM   Modules accepted: Orders

## 2022-03-08 NOTE — Telephone Encounter (Signed)
Pt called back stating she had labs done at her PT appt on 01/02. Requesting call back if she needed more than this.

## 2022-03-09 DIAGNOSIS — Z6830 Body mass index (BMI) 30.0-30.9, adult: Secondary | ICD-10-CM

## 2022-03-10 LAB — HGB A1C W/O EAG: Hgb A1c MFr Bld: 6.5 % — ABNORMAL HIGH (ref 4.8–5.6)

## 2022-03-10 LAB — SPECIMEN STATUS REPORT

## 2022-03-20 ENCOUNTER — Other Ambulatory Visit: Payer: Self-pay | Admitting: Nurse Practitioner

## 2022-03-20 MED ORDER — WEGOVY 0.25 MG/0.5ML ~~LOC~~ SOAJ: 0.2500 mg | SUBCUTANEOUS | 2 refills | Status: DC

## 2022-03-23 ENCOUNTER — Other Ambulatory Visit (HOSPITAL_COMMUNITY): Payer: Self-pay

## 2022-03-23 NOTE — Telephone Encounter (Signed)
Patient Advocate Encounter   Received notification from [facility] that prior authorization for [facility] is required.   PA submitted on 03/23/2022 Key [id number] Status is pending

## 2022-03-23 NOTE — Telephone Encounter (Signed)
Pharmacy Patient Advocate Encounter  Received notification from [facility] that the request for prior authorization for [facility] has been denied due to .

## 2022-03-28 ENCOUNTER — Ambulatory Visit: Payer: 59 | Admitting: Nurse Practitioner

## 2022-03-28 ENCOUNTER — Telehealth: Payer: Self-pay | Admitting: Pharmacist

## 2022-03-28 ENCOUNTER — Telehealth: Payer: Self-pay | Admitting: Nurse Practitioner

## 2022-03-28 ENCOUNTER — Encounter: Payer: Self-pay | Admitting: Pharmacist

## 2022-03-28 ENCOUNTER — Encounter: Payer: Self-pay | Admitting: Nurse Practitioner

## 2022-03-28 VITALS — BP 121/73 | HR 71 | Temp 97.5°F | Ht 60.0 in | Wt 196.4 lb

## 2022-03-28 DIAGNOSIS — Z6833 Body mass index (BMI) 33.0-33.9, adult: Secondary | ICD-10-CM

## 2022-03-28 DIAGNOSIS — L299 Pruritus, unspecified: Secondary | ICD-10-CM

## 2022-03-28 DIAGNOSIS — B351 Tinea unguium: Secondary | ICD-10-CM | POA: Diagnosis not present

## 2022-03-28 NOTE — Telephone Encounter (Signed)
Please review

## 2022-03-28 NOTE — Telephone Encounter (Signed)
Almost positive this will be a coverage exclusion  Submitting PA

## 2022-03-28 NOTE — Telephone Encounter (Signed)
Patient would like to speak to Crystal Wong about what her insurance told her about [facility]. Please call back

## 2022-03-28 NOTE — Progress Notes (Signed)
Subjective:    Patient ID: Crystal Wong, female    DOB: June 02, 1955, 67 y.o.   MRN: [id number]  Chief Complaint: Pruritis (Hands and feet comes and goes started less than a month ago )   HPI Patient come sin with 3 complaints: - fungus on right big toe nail. She has been taking lamasil for a couple of weeks now.just wants it looked at again. Seems  to e getting better. - Palms of hands and bottoms of feet had itching episode that last over 30 minutes a few nights ago. Wondered what could be causing it.  - prior auth for [facility] was denied  Patient's BMI is >30 mg/m2.  Patient's current BMI is Body mass index is 38.36 kg/m.Marland Kitchen  Patient is currently enrolled in a healthy eating plan along with encouraged exercise.  Patient has contraindications to phentermine, Contrave & Qsymia (contains phentermine).  Patient does not have a personal or family history of medullary thyroid carcinoma (MTC) or Multiple Endocrine Neoplasia syndrome type 2 (MEN 2).     Review of Systems  Constitutional:  Negative for diaphoresis.  Eyes:  Negative for pain.  Respiratory:  Negative for shortness of breath.   Cardiovascular:  Negative for chest pain, palpitations and leg swelling.  Gastrointestinal:  Negative for abdominal pain.  Endocrine: Negative for polydipsia.  Skin:  Negative for rash.  Neurological:  Negative for dizziness, weakness and headaches.  Hematological:  Does not bruise/bleed easily.  All other systems reviewed and are negative.      Objective:   Physical Exam Vitals and nursing note reviewed.  Constitutional:      General: She is not in acute distress.    Appearance: Normal appearance. She is well-developed.  Neck:     Vascular: No carotid bruit or JVD.  Cardiovascular:     Rate and Rhythm: Normal rate and regular rhythm.     Heart sounds: Normal heart sounds.  Pulmonary:     Effort: Pulmonary effort is normal. No respiratory distress.     Breath sounds: Normal breath sounds. No  wheezing or rales.  Chest:     Chest wall: No tenderness.  Abdominal:     General: Bowel sounds are normal. There is no distension or abdominal bruit.     Palpations: Abdomen is soft. There is no hepatomegaly, splenomegaly, mass or pulsatile mass.     Tenderness: There is no abdominal tenderness.  Musculoskeletal:        General: Normal range of motion.     Cervical back: Normal range of motion and neck supple.  Lymphadenopathy:     Cervical: No cervical adenopathy.  Skin:    General: Skin is warm and dry.  Neurological:     Mental Status: She is alert and oriented to person, place, and time.     Deep Tendon Reflexes: Reflexes are normal and symmetric.  Psychiatric:        Behavior: Behavior normal.        Thought Content: Thought content normal.        Judgment: Judgment normal.    BP 121/73   Pulse 71   Temp (!) 97.5 F (36.4 C) (Temporal)   Ht 5' (1.524 m)   Wt 196 lb 6.4 oz (89.1 kg)   HC 6" (15.2 cm)   SpO2 96%   BMI 38.36 kg/m         Assessment & Plan:   Crystal Wong in today with chief complaint of Pruritis (Hands and feet  comes and goes started less than a month ago )   1. Pruritus Lotion when occurs Benadryk or claritin OTC- suggest claritin daily if strats happening frequently Avoid scratching areas if possib;e   2. Onychomycosis Continue lamisil as prescribed  3. BMI 33.0-33.9,adult Will discuss prior auth with cliicalpharmacist    The above assessment and management plan was discussed with the patient. The patient verbalized understanding of and has agreed to the management plan. Patient is aware to call the clinic if symptoms persist or worsen. Patient is aware when to return to the clinic for a follow-up visit. Patient educated on when it is appropriate to go to the emergency department.   [name] Done, FNP

## 2022-03-29 NOTE — Telephone Encounter (Signed)
Hey!  Looks like her A1c was 6.9% last summer.  We can do trulicity since she meets the T2DM guidelines.  Are you okay with starting Trulicity? Patient called insurance and said they covered.  Thanks!

## 2022-03-30 ENCOUNTER — Telehealth: Payer: Self-pay | Admitting: Nurse Practitioner

## 2022-03-30 NOTE — Telephone Encounter (Signed)
Please review

## 2022-03-31 ENCOUNTER — Telehealth: Payer: Self-pay | Admitting: Nurse Practitioner

## 2022-03-31 NOTE — Telephone Encounter (Signed)
Patient states she is waiting on MMM to reply to her mychart message.  Advised patient that you have been seeing patients all day and you would reply as soon as you could.   Patient also wanted me to let you know that she tried to take the miralx with apple juice due to her constipation.  States that is is not helping.  She has to take 2 dulcolax once a week and miralax to go to the bathroom.  Has appointment with GI on 05/02/22.

## 2022-04-01 ENCOUNTER — Other Ambulatory Visit: Payer: Self-pay | Admitting: Nurse Practitioner

## 2022-04-01 DIAGNOSIS — E782 Mixed hyperlipidemia: Secondary | ICD-10-CM

## 2022-04-03 ENCOUNTER — Other Ambulatory Visit: Payer: Self-pay | Admitting: Nurse Practitioner

## 2022-04-03 DIAGNOSIS — E782 Mixed hyperlipidemia: Secondary | ICD-10-CM

## 2022-04-04 MED ORDER — TRULICITY 0.75 MG/0.5ML ~~LOC~~ SOAJ: 0.7500 mg | SUBCUTANEOUS | 3 refills | Status: DC

## 2022-04-04 NOTE — Telephone Encounter (Signed)
  Export 420 MG/3.5ML SOCT        Changed from: Alirocumab (PRALUENT) 150 MG/ML SOAJ   Pharmacy comment: Alternative Requested:or PRIOR AUTH REQUIRED.

## 2022-04-04 NOTE — Telephone Encounter (Signed)
Do you want me to send in? Trulicity? Just let me know!

## 2022-04-05 ENCOUNTER — Telehealth: Payer: Self-pay | Admitting: Cardiology

## 2022-04-05 NOTE — Telephone Encounter (Signed)
Forwarded to pharmD to assist.

## 2022-04-05 NOTE — Telephone Encounter (Signed)
Pt c/o medication issue:  1. Name of Medication: Alirocumab (PRALUENT) 150 MG/ML SOAJ   2. How are you currently taking this medication (dosage and times per day)? N/A  3. Are you having a reaction (difficulty breathing--STAT)? No   4. What is your medication issue? Patient states yearly PA is due.

## 2022-04-06 ENCOUNTER — Other Ambulatory Visit (HOSPITAL_COMMUNITY): Payer: Self-pay

## 2022-04-07 ENCOUNTER — Other Ambulatory Visit: Payer: Self-pay | Admitting: Nurse Practitioner

## 2022-04-09 ENCOUNTER — Other Ambulatory Visit: Payer: Self-pay | Admitting: Nurse Practitioner

## 2022-04-10 ENCOUNTER — Other Ambulatory Visit: Payer: Self-pay | Admitting: Nurse Practitioner

## 2022-04-10 MED ORDER — PRALUENT 150 MG/ML ~~LOC~~ SOAJ: 150.0000 mg | SUBCUTANEOUS | 2 refills | Status: DC

## 2022-04-10 NOTE — Telephone Encounter (Signed)
Help.

## 2022-04-11 ENCOUNTER — Telehealth: Payer: Self-pay | Admitting: Pharmacist

## 2022-04-11 ENCOUNTER — Encounter: Payer: Self-pay | Admitting: Pharmacist

## 2022-04-11 ENCOUNTER — Other Ambulatory Visit (HOSPITAL_COMMUNITY): Payer: Self-pay

## 2022-04-11 ENCOUNTER — Encounter: Payer: Self-pay | Admitting: Cardiology

## 2022-04-11 NOTE — Telephone Encounter (Signed)
PA for trulicity sent Please follow and let patient know outcomes Sending to PCP as just an [place]

## 2022-04-11 NOTE — Telephone Encounter (Signed)
Pt insurance prefers [place]. She has been on in the past. Called pt, she is ok with the switch. Will complete the PA.

## 2022-04-11 NOTE — Telephone Encounter (Signed)
Trulicity approved  Please let patient and pharmacy know Approved until 04/12/23

## 2022-04-11 NOTE — Telephone Encounter (Signed)
Patient and pharmacy notified

## 2022-04-12 ENCOUNTER — Other Ambulatory Visit (HOSPITAL_COMMUNITY): Payer: Self-pay

## 2022-04-12 NOTE — Telephone Encounter (Signed)
PA submitted Key: [id number]

## 2022-04-13 ENCOUNTER — Other Ambulatory Visit: Payer: Self-pay | Admitting: Nurse Practitioner

## 2022-04-13 ENCOUNTER — Telehealth: Payer: Self-pay | Admitting: Nurse Practitioner

## 2022-04-13 MED ORDER — DEXLANSOPRAZOLE 60 MG PO CPDR: 60.0000 mg | DELAYED_RELEASE_CAPSULE | Freq: Every day | ORAL | 1 refills | Status: DC

## 2022-04-13 MED ORDER — DEXLANSOPRAZOLE 30 MG PO CPDR: 30.0000 mg | DELAYED_RELEASE_CAPSULE | Freq: Every day | ORAL | 1 refills | Status: DC

## 2022-04-13 NOTE — Telephone Encounter (Signed)
  dexlansoprazole (DEXILANT) 60 MG capsule        Changed from: Dexlansoprazole (DEXILANT) 30 MG capsule DR  Pharmacy comment: Alternative Requested:PATIENT HAS BEEN TAKING DESLANSOPRAZOLE DR '60MG'$  CAPSULES AND PREFERS THAT DOSE; PLEASE RESEND FOR '60MG'$  CAPSULE.

## 2022-04-17 MED ORDER — DEXLANSOPRAZOLE 60 MG PO CPDR: 60.0000 mg | DELAYED_RELEASE_CAPSULE | Freq: Every day | ORAL | 0 refills | Status: DC

## 2022-04-17 NOTE — Telephone Encounter (Signed)
Script failed. resent

## 2022-04-18 ENCOUNTER — Other Ambulatory Visit (HOSPITAL_COMMUNITY): Payer: Self-pay

## 2022-05-02 ENCOUNTER — Encounter: Payer: Self-pay | Admitting: Gastroenterology

## 2022-05-02 ENCOUNTER — Other Ambulatory Visit: Payer: Self-pay | Admitting: Gastroenterology

## 2022-05-02 ENCOUNTER — Ambulatory Visit: Payer: 59 | Admitting: Gastroenterology

## 2022-05-02 VITALS — BP 130/76 | HR 81 | Ht 66.0 in | Wt 194.0 lb

## 2022-05-02 DIAGNOSIS — K5900 Constipation, unspecified: Secondary | ICD-10-CM | POA: Diagnosis not present

## 2022-05-02 DIAGNOSIS — K2100 Gastro-esophageal reflux disease with esophagitis, without bleeding: Secondary | ICD-10-CM | POA: Diagnosis not present

## 2022-05-02 DIAGNOSIS — R109 Unspecified abdominal pain: Secondary | ICD-10-CM

## 2022-05-02 DIAGNOSIS — K224 Dyskinesia of esophagus: Secondary | ICD-10-CM

## 2022-05-02 DIAGNOSIS — K219 Gastro-esophageal reflux disease without esophagitis: Secondary | ICD-10-CM | POA: Diagnosis not present

## 2022-05-02 MED ORDER — DEXLANSOPRAZOLE 60 MG PO CPDR: 60.0000 mg | DELAYED_RELEASE_CAPSULE | Freq: Every day | ORAL | 3 refills | Status: DC

## 2022-05-02 MED ORDER — MOTEGRITY 2 MG PO TABS: 2.0000 mg | ORAL_TABLET | Freq: Every day | ORAL | 3 refills | Status: DC

## 2022-05-02 MED ORDER — FAMOTIDINE 20 MG PO TABS: 20.0000 mg | ORAL_TABLET | Freq: Two times a day (BID) | ORAL | 3 refills | Status: DC

## 2022-05-02 NOTE — Progress Notes (Signed)
Referring Provider: Chevis Pretty, * Primary Care Physician:  Chevis Pretty, FNP  Chief complaint: Reflux medications and constipation   IMPRESSION:  Reflux esophagitis presenting with chest/esophageal spasm    - prior EGD with Dr. Roney Mans 2017, no biopsies obtained at that time    - EGD 06/09/19: reflux esophagitis, no eosinophilic esophagitis or Barrett's Chronic constipation - previously controlled on Miralax    - TSH and calcium normal 2020 Mild H pylori negative gastritis on EGD 06/09/19 Asymptomatic cholelithiasis Colon cancer screening    - Colonoscopy with Dr. Roney Mans 2017    - Cologuard negative 2023 Family history of colon cancer (father in his 14s)  GERD: Coninue Dexilant 60 mg daily and famotidine 20 mg BID. Recommend 24 pH probe, impedence testing, and esophageal manometry if not improving. Discussed long term risks of PPI use.   H pylori negative gastritis: Continue PPI and H2Blocker. Avoid all NSAIDs.   Chronic constipation: Discussed dietary recommendations. Miralax no longer controlling symptoms. Trial of Motegrity recommended with the hope that this may also provider some improvement in upper GI tract dysmotility.  PLAN: - Continue Dexilant to 60 mg daily, continue famotidine 20 mg twice daily (90 day refill with 3 months) - Reviewed lifestyle modifications including working to maintain a health weight - Continue daily Miralax - Trial of Motegrity 3 mg QD - Follow-up in 3-4 months, earlier if needed  Please see the "Patient Instructions" section for addition details about the plan.  HPI: Crystal Wong is a 67 y.o. returns in scheduled follow-up for reflux. She was last seen 12/24/19. The interval history is obtained through the patient and review of her electronic health record. She has anxiety, asthma, depression, reflux, hypercholesterolemia, obesity, and prior hysterectomy. Retired Academic librarian. She was previously treated by Dr. Roney Mans.  Please see prior notes for complete details.  On prior visits, she has reported years of GERD with intermittent postprandial pyrosis with associated dysphonia and throat discomfort, occasional dysphagia to solids and intermittent epigastric pain as well as chronic constipation.  She was having acute, intermittent "attacks" described as chest and/or stomach spasms. Frequent brash and nocturnal symptoms.   No dysphagia or odynophagia. No dysphonia. No sore throat or neck pain.   Increased famotidine QHS dose, continued her Dexilant 30 mg QAM, and added carafate 1 g QID, but she found the dosing schedule cumbersome.  Was having significant gas at that time. Changed her diet to use protein shakes with fruit in the morning, cut back on caffeine, eating less processed foods with smaller portions. Working out on an Teacher, adult education.   She had an EGD 06/09/19 that showed: - Normal esophagus. Biopsies showed reflux and were negative for Barrett's and EOE. - Mild chronic gastritis. No H pylori.  - Normal examined duodenum.  Seen in follow-up 07/22/19 taking Dexilant 60 mg once daily and famotidine 20 mg twice daily, until her insurannce wouldn't approve a refill on the 60 mg daily Dexilant. They would only allow 30 mg daily.  She was avoiding all NSAIDs. Purchased the OTC famotidine instead. Having some intermittent symptoms.   On office visit 12/24/19 she noted improved symptoms on Dexilant and famotidine. No additional palpitations or spasms. She has removed fried foods and sugar from her diet and reduced her snacking. She is feeling better although she is frustrated that she's not losing fat. She is lifting weights and doing HIIT. She is motivated to be healthy to be around for her grand children.   Returns in follow-up  today. Despite using Miralax daily, her constipation returned. She has been using Smooth Move every day with a resulting in a bowel movement twice a week. She will use Dulcolax as needed.    She is  on Dexilant. Unfortunately, despite it controlling her symptoms well, her insurance does not pay for it and she has been paying for it out of pocket.   Cologuard negative 2023  No recent abdominal imaging  Labs 03/07/22 showed normal CMP except for glucose of 119 and normal CBC  Past Medical History:  Diagnosis Date   Anxiety    Asthma    Depression    GERD (gastroesophageal reflux disease)    Heart murmur    Hyperlipidemia    Insomnia     Past Surgical History:  Procedure Laterality Date   ANKLE FRACTURE SURGERY Left    Pins and screws   BREAST LUMPECTOMY Bilateral    left x 1, right x 2   CARPAL TUNNEL RELEASE Right    CARPAL TUNNEL RELEASE Left    COLONOSCOPY     KNEE ARTHROSCOPY Bilateral    PARTIAL HYSTERECTOMY      Current Outpatient Medications  Medication Sig Dispense Refill   Dulaglutide (TRULICITY) A999333 0000000 SOPN Inject 0.75 mg into the skin once a week. 2 mL 3   albuterol (VENTOLIN HFA) 108 (90 Base) MCG/ACT inhaler 2 puff q6 prn (Patient not taking: Reported on 03/07/2022) 18 g 1   Alirocumab (PRALUENT) 150 MG/ML SOAJ Inject 150 mg into the skin every 14 (fourteen) days. 2 mL 2   betamethasone dipropionate 0.05 % lotion APPLY 1 APPLICATION TO THE SCALP NIGHTLY     budesonide-formoterol (SYMBICORT) 80-4.5 MCG/ACT inhaler Take 2 puffs first thing in am and then another 2 puffs about 12 hours later. (Patient not taking: Reported on 02/16/2022) 1 each 12   citalopram (CELEXA) 40 MG tablet Take 1 tablet (40 mg total) by mouth daily. 90 tablet 1   dexlansoprazole (DEXILANT) 60 MG capsule Take 1 capsule (60 mg total) by mouth daily. 30 capsule 0   doxycycline (VIBRAMYCIN) 100 MG capsule Take 100 mg by mouth daily.     dutasteride (AVODART) 0.5 MG capsule      Evolocumab with Infusor (REPATHA PUSHTRONEX SYSTEM) 420 MG/3.5ML SOCT Inject 420 mg into the skin every 30 (thirty) days. 3.5 mL 5   famotidine (PEPCID) 20 MG tablet Take 1 tablet (20 mg total) by mouth 2 (two)  times daily. 180 tablet 1   fluticasone (FLONASE) 50 MCG/ACT nasal spray Place 2 sprays into both nostrils daily. 16 g 6   hydrocortisone (ANUSOL-HC) 25 MG suppository PLACE 1 SUPPOSITORY (25 MG TOTAL) RECTALLY 2 (TWO) TIMES DAILY. 12 suppository 0   lansoprazole (PREVACID) 30 MG capsule Take 1 capsule (30 mg total) by mouth 2 (two) times daily before a meal. (Patient not taking: Reported on 03/28/2022) 60 capsule 2   LORATADINE-D 24HR 10-240 MG 24 hr tablet Take 1 tablet by mouth once daily (Patient not taking: Reported on 03/28/2022) 90 tablet 1   LORazepam (ATIVAN) 1 MG tablet Take 1 tablet (1 mg total) by mouth every 8 (eight) hours as needed. 30 tablet 5   melatonin 5 MG TABS Take 5 mg by mouth at bedtime.     rosuvastatin (CRESTOR) 40 MG tablet Take 1 tablet (40 mg total) by mouth daily. 90 tablet 1   terbinafine (LAMISIL) 250 MG tablet Take 1 tablet (250 mg total) by mouth daily. 90 tablet 0   VITAMIN  D, CHOLECALCIFEROL, PO Take 1 tablet by mouth daily.      No current facility-administered medications for this visit.    Allergies as of 05/02/2022 - Review Complete 04/11/2022  Allergen Reaction Noted   Sulfa antibiotics Anaphylaxis 12/19/2012   Sulfamethoxazole-trimethoprim Swelling 03/24/2014   Elemental sulfur Other (See Comments) 05/12/2015    Family History  Problem Relation Age of Onset   Heart disease Mother    Diabetes Mother    Colon polyps Mother    Colon cancer Father 47   Gallbladder disease Daughter    Gallstones Paternal Grandmother    Esophageal cancer Neg Hx    Stomach cancer Neg Hx    Rectal cancer Neg Hx    Breast cancer Neg Hx        Physical Exam: General:   Alert,  well-nourished, pleasant and cooperative in NAD Head:  Normocephalic and atraumatic. Eyes:  Sclera clear, no icterus.   Conjunctiva pink. Abdomen:  Soft, nontender, nondistended, normal bowel sounds, no rebound or guarding. No hepatosplenomegaly.   Neurologic:  Alert and  oriented x4;   grossly nonfocal Skin:  Intact without significant lesions or rashes. Psych:  Alert and cooperative. Normal mood and affect.      Javae Braaten L. Tarri Glenn, MD, MPH 05/02/2022, 10:44 AM

## 2022-05-02 NOTE — Patient Instructions (Addendum)
I recommend that you eat at least 25-30 grams of fiber daily and drink at least 64 ounces of water daily. You will want to gradually increase the fiber in your diet to avoid bloating. You may increase the fiber through diet and through fiber supplements including psyllium and methycellulose.   Natural laxatives include prunes, apples, apricots, cherries, peaches, pears, aloe, rhubarb, kiwi, bananas, mango, papaya, and watermelon. In particular, two kiwi a day has been show to cause less likely to cause bloating than prunes or psyllium.  As long as you have healthy kidneys, another options is using magnesium oxide supplements. I recommend starting with 500 mg daily. You could increase the dose to 1000 mg daily after one week if that doesn't seem to be helping.   I recommend a trial of Motegrity 3 mg daily for the constipation. This may also help with the reflux.   Continue to avoid foods that trigger your reflux.  Work to maintain a healthy weight.   We will try to get your Dexilant approved.   Recommend follow-up in 3-4 months.   _______________________________________________________  If your blood pressure at your visit was 140/90 or greater, please contact your primary care physician to follow up on this.  _______________________________________________________  If you are age 27 or older, your body mass index should be between 23-30. Your Body mass index is 31.31 kg/m. If this is out of the aforementioned range listed, please consider follow up with your Primary Care Provider.  If you are age 17 or younger, your body mass index should be between 19-25. Your Body mass index is 31.31 kg/m. If this is out of the aformentioned range listed, please consider follow up with your Primary Care Provider.   ________________________________________________________  The [facility] providers would like to encourage you to use [facility] to communicate with providers for non-urgent requests or  questions.  Due to long hold times on the telephone, sending your provider a message by [facility] may be a faster and more efficient way to get a response.  Please allow 48 business hours for a response.  Please remember that this is for non-urgent requests.  _______________________________________________________

## 2022-05-02 NOTE — Telephone Encounter (Signed)
Do you want to submit for the PA or switch to Linzess or Amitiza? Please advise.

## 2022-05-03 NOTE — Telephone Encounter (Signed)
Please submit for PA

## 2022-05-08 ENCOUNTER — Telehealth: Payer: Self-pay | Admitting: Cardiology

## 2022-05-08 ENCOUNTER — Telehealth: Payer: Self-pay | Admitting: Gastroenterology

## 2022-05-08 DIAGNOSIS — M1712 Unilateral primary osteoarthritis, left knee: Secondary | ICD-10-CM | POA: Diagnosis not present

## 2022-05-08 NOTE — Telephone Encounter (Signed)
Returned call to patient who states that she has been having some fluttering in her chest. Patient denies any chest pain with exertion, or shortness of breath. Patient has significant history of GERD and constipation and states that she is having issues with this currently. Patient states that she is following with her GI. Patient states she's just concerned that it could be her heart and wants to rule it out. Scheduled patient to see Dr. Gardiner Rhyme on 3/7 at [place]. Patient aware of appointment time and date.   Made patient aware of ED precautions should new or worsening symptoms develop. Patient verbalized understanding.

## 2022-05-08 NOTE — Telephone Encounter (Signed)
Patient called states she having a lot of spasms in her esophagus and is also wanting to discuss her Motegrity medication because it is too expensive for her.

## 2022-05-08 NOTE — Telephone Encounter (Signed)
Pt c/o of Chest Pain: STAT if CP now or developed within 24 hours  1. Are you having CP right now? No  2. Are you experiencing any other symptoms (ex. SOB, nausea, vomiting, sweating)? No  3. How long have you been experiencing CP? More than a week  4. Is your CP continuous or coming and going? Coming and going  5. Have you taken Nitroglycerin? No   Pt calling in regards to having some chest spasms/ discomfort, she is unsure whether this is related to her GERD. Please advise.  ?

## 2022-05-08 NOTE — Telephone Encounter (Signed)
Left message on machine to call back  

## 2022-05-09 NOTE — Telephone Encounter (Signed)
[place]le the pt called to inquire on the PA for Motegrity and Dexilant.  She says she spoke with the insurance and they are waiting on a response from our office. Thank you

## 2022-05-09 NOTE — Telephone Encounter (Signed)
Patient is requesting a call from you.Please advise , Thanks

## 2022-05-09 NOTE — Telephone Encounter (Signed)
Motegrity is too expensive and would like an alternative. She also continues to have complaints of reflux despite Dexilant.  She is following anti reflux precautions.  She states the reflux is worse at night.  Please advise

## 2022-05-10 NOTE — Progress Notes (Unsigned)
Cardiology Office Note:    Date:  05/11/2022   ID:  Crystal Wong, DOB 1955-04-22, MRN [id number]  PCP:  Chevis Pretty, FNP  Cardiologist:  Donato Heinz, MD  Electrophysiologist:  None   Referring MD: Chevis Pretty, *   Chief Complaint  Patient presents with   Palpitations    History of Present Illness:    Crystal Wong is a 67 y.o. female with a hx of anxiety, depression, asthma, GERD, hyperlipidemia who presents for follow-up.  She was referred by Dr. Tarri Glenn for pre-op evaluation, initially seen on 05/19/2019.  She was referred to gastroenterology for EGD, requested cardiac evaluation prior to procedure.  She reports that she has been having a fluttering feeling in her chest that started in January.  Was occurring every day, but has not had for the last week.  Can last for 20 minutes up to hours.  In addition to chest fluttering, she sometimes avoids episodes where she feels like her heart is racing, but states that this is related to caffeine use.  Has also been having burning in her chest that she has attributed to GERD.  Has improved with increasing her GERD medications.  Reports that she exercises regularly, walks 4 miles per day and does Pilates for 30 minutes.  Denies any exertional chest pain.  Does report some dyspnea with walking up hills.  Never smoked.  Mother had MI at 42.   Reports she was told years ago she had mitral valve prolapse after she was evaluated for heart murmur  At initial clinic visit on 05/19/2019, no further work-up was recommended prior to her EGD, which she underwent on 06/09/2019.  TTE on 06/03/2019 showed LVEF 60 to 65%, normal RV function, mild aortic stenosis, mild to moderate aortic regurgitation.  Zio patch x14 days on 07/17/2019 showed no significant arrhythmias.  Calcium score on 06/03/2019 was 0.  LDL 205 on 08/19/2019 despite being on rosuvastatin 40 mg daily.  Referred to lipid clinic, started on [place].  Repeat lipid panel on  12/24/2019 showed marked improvement in LDL to 75.  Reported worsening palpitations and in Zio patch x3 days on 05/06/2020 showed no significant arrhythmias.  Echocardiogram on 05/18/2020 showed normal biventricular function, mild AS, mild to moderate AI.  Since last clinic visit, she reports she is doing okay.  States that she has not had palpitations for years but over the last 3 days has had multiple episodes of palpitations.  Feels like it is related to her GI issues, she has been having issues with constipation and started taking MiraLAX.  Palpitations last up to 1 minute. Denies any chest pain, dyspnea, lightheadedness, syncope, lower extremity edema.  Has been doing pilates for exercise.   Past Medical History:  Diagnosis Date   Anxiety    Asthma    Depression    GERD (gastroesophageal reflux disease)    Heart murmur    Hyperlipidemia    Insomnia     Past Surgical History:  Procedure Laterality Date   ANKLE FRACTURE SURGERY Left    Pins and screws   BREAST LUMPECTOMY Bilateral    left x 1, right x 2   CARPAL TUNNEL RELEASE Right    CARPAL TUNNEL RELEASE Left    COLONOSCOPY     KNEE ARTHROSCOPY Bilateral    PARTIAL HYSTERECTOMY      Current Medications: Current Meds  Medication Sig   albuterol (VENTOLIN HFA) 108 (90 Base) MCG/ACT inhaler 2 puff q6 prn   betamethasone  dipropionate 0.05 % lotion APPLY 1 APPLICATION TO THE SCALP NIGHTLY   budesonide-formoterol (SYMBICORT) 80-4.5 MCG/ACT inhaler Take 2 puffs first thing in am and then another 2 puffs about 12 hours later.   citalopram (CELEXA) 40 MG tablet Take 1 tablet (40 mg total) by mouth daily.   dexlansoprazole (DEXILANT) 60 MG capsule Take 1 capsule (60 mg total) by mouth daily.   doxycycline (VIBRAMYCIN) 100 MG capsule Take 100 mg by mouth daily.   Dulaglutide (TRULICITY) A999333 0000000 SOPN Inject 0.75 mg into the skin once a week.   dutasteride (AVODART) 0.5 MG capsule    Evolocumab with Infusor (REPATHA PUSHTRONEX  SYSTEM) 420 MG/3.5ML SOCT Inject 420 mg into the skin every 30 (thirty) days.   famotidine (PEPCID) 20 MG tablet Take 1 tablet (20 mg total) by mouth 2 (two) times daily.   fluticasone (FLONASE) 50 MCG/ACT nasal spray Place 2 sprays into both nostrils daily.   hydrocortisone (ANUSOL-HC) 25 MG suppository PLACE 1 SUPPOSITORY (25 MG TOTAL) RECTALLY 2 (TWO) TIMES DAILY.   lansoprazole (PREVACID) 30 MG capsule Take 1 capsule (30 mg total) by mouth 2 (two) times daily before a meal.   LORATADINE-D 24HR 10-240 MG 24 hr tablet Take 1 tablet by mouth once daily   LORazepam (ATIVAN) 1 MG tablet Take 1 tablet (1 mg total) by mouth every 8 (eight) hours as needed.   melatonin 5 MG TABS Take 5 mg by mouth at bedtime.   Prucalopride Succinate (MOTEGRITY) 2 MG TABS Take 1 tablet (2 mg total) by mouth daily.   rosuvastatin (CRESTOR) 40 MG tablet Take 1 tablet (40 mg total) by mouth daily.   VITAMIN D, CHOLECALCIFEROL, PO Take 1 tablet by mouth daily.      Allergies:   Sulfa antibiotics, Sulfamethoxazole-trimethoprim, and Elemental sulfur   Social History   Socioeconomic History   Marital status: Married    Spouse name: Not on file   Number of children: 2   Years of education: Not on file   Highest education level: Not on file  Occupational History   Occupation: retired  Tobacco Use   Smoking status: Never   Smokeless tobacco: Never  Vaping Use   Vaping Use: Never used  Substance and Sexual Activity   Alcohol use: Not Currently   Drug use: No   Sexual activity: Not on file  Other Topics Concern   Not on file  Social History Narrative   Not on file   Social Determinants of Health   Financial Resource Strain: Not on file  Food Insecurity: Not on file  Transportation Needs: Not on file  Physical Activity: Not on file  Stress: Not on file  Social Connections: Not on file     Family History: The patient's family history includes Colon cancer (age of onset: 9) in her father; Colon polyps  in her mother; Diabetes in her mother; Gallbladder disease in her daughter; Gallstones in her paternal grandmother; Heart disease in her mother. There is no history of Esophageal cancer, Stomach cancer, Rectal cancer, or Breast cancer.  ROS:   Please see the history of present illness.     All other systems reviewed and are negative.  EKGs/Labs/Other Studies Reviewed:    The following studies were reviewed today:   EKG:   05/11/22: NSR, RBBB, LAFB, rate 72 06/07/21: Normal sinus rhythm, rate 77, right bundle branch block, left anterior fascicular block, no ST abnormalities 04/23/2020- The ekg ordered  demonstrates normal sinus rhythm, rate 79, RBBB, no ST/T  wave abnormalities  Recent Labs: 03/07/2022: ALT 19; BUN 15; Creatinine, Ser 0.94; Hemoglobin 13.8; Platelets 247; Potassium 4.4; Sodium 140; TSH 1.680  Recent Lipid Panel    Component Value Date/Time   CHOL 179 03/07/2022 1229   CHOL 225 (H) 08/23/2012 1155   TRIG 114 03/07/2022 1229   TRIG 125 12/23/2013 1004   TRIG 51 08/23/2012 1155   HDL 75 03/07/2022 1229   HDL 58 12/23/2013 1004   HDL 59 08/23/2012 1155   CHOLHDL 2.4 03/07/2022 1229   LDLCALC 84 03/07/2022 1229   LDLCALC 163 (H) 12/23/2013 1004   LDLCALC 156 (H) 08/23/2012 1155    Physical Exam:    VS:  BP 125/76   Pulse 72   Ht '5\' 6"'$  (1.676 m)   Wt 193 lb 9.6 oz (87.8 kg)   SpO2 97%   BMI 31.25 kg/m     Wt Readings from Last 3 Encounters:  05/11/22 193 lb 9.6 oz (87.8 kg)  05/02/22 194 lb (88 kg)  03/28/22 196 lb 6.4 oz (89.1 kg)     GEN:  Well nourished, well developed in no acute distress HEENT: Normal NECK: No JVD; No carotid bruits CARDIAC: RRR, 2/6 systolic heart murmur RESPIRATORY:  Clear to auscultation without rales, wheezing or rhonchi  ABDOMEN: Soft, non-tender, non-distended MUSCULOSKELETAL:  No edema; No deformity  SKIN: Warm and dry NEUROLOGIC:  Alert and oriented x 3 PSYCHIATRIC:  Normal affect   ASSESSMENT:    1. Palpitations   2.  Aortic valve stenosis, etiology of cardiac valve disease unspecified   3. Aortic valve insufficiency, etiology of cardiac valve disease unspecified   4. Hyperlipidemia, unspecified hyperlipidemia type     PLAN:    Palpitations: Zio patch x14 days on 07/17/2019 showed no significant arrhythmias.  At clinic visit 04/23/2020, reports now having frequent palpitations, different in character than palpitation she was having before.  Zio patch x3 days on 05/06/2020 showed no significant arrhythmias.  Echocardiogram on 05/18/2020 showed normal biventricular function, mild AS, mild to moderate AI. -Reports worsening palpitations recently, check Zio patch x 7 days.  Check BMET/magnesium  Aortic stenosis/regurgitation:  TTE on 06/03/2019 showed LVEF 60 to 65%, normal RV function, mild aortic stenosis, mild to moderate aortic regurgitation.  Echocardiogram on 05/18/2020 showed normal biventricular function, mild AS, mild to moderate AI. Will monitor with repeat echo, scheduled for 06/06/2022  Hyperlipidemia: LDL 205 on 08/19/2019 despite being on rosuvastatin 40 mg daily.  Referred to lipid clinic, started on [place].  Repeat lipid panel on 12/24/2019 showed marked improvement in LDL to 75.  Calcium score 0 on 06/03/2019. -LDL 84 on 03/07/2022, continue Repatha  RTC in 1 month    Medication Adjustments/Labs and Tests Ordered: Current medicines are reviewed at length with the patient today.  Concerns regarding medicines are outlined above.  Orders Placed This Encounter  Procedures   Basic metabolic panel   Magnesium   LONG TERM MONITOR (3-14 DAYS)   No orders of the defined types were placed in this encounter.   Patient Instructions  Medication Instructions:  Your physician recommends that you continue on your current medications as directed. Please refer to the Current Medication list given to you today.  *If you need a refill on your cardiac medications before your next appointment, please call your  pharmacy*   Lab Work: BMET, [place] today  If you have labs (blood work) drawn today and your tests are completely normal, you will receive your results only by: [employer] (if  you have MyChart) OR A paper copy in the mail If you have any lab test that is abnormal or we need to change your treatment, we will call you to review the results.   Testing/Procedures: [place]- Long Term Monitor Instructions   Your physician has requested you wear a ZIO patch monitor for _7_ days.  This is a single patch monitor.   [employer] supplies one patch monitor per enrollment. Additional stickers are not available. Please do not apply patch if you will be having a Nuclear Stress Test, Echocardiogram, Cardiac CT, MRI, or Chest Xray during the period you would be wearing the monitor. The patch cannot be worn during these tests. You cannot remove and re-apply the ZIO XT patch monitor.  Your ZIO patch monitor will be sent Fed Ex from [employer] directly to your home address. It may take 3-5 days to receive your monitor after you have been enrolled.  Once you have received your monitor, please review the enclosed instructions. Your monitor has already been registered assigning a specific monitor serial # to you.  Billing and Patient Assistance Program Information   We have supplied [employer] with any of your insurance information on file for billing purposes. [employer] offers a sliding scale Patient Assistance Program for patients that do not have insurance, or whose insurance does not completely cover the cost of the ZIO monitor.   You must apply for the Patient Assistance Program to qualify for this discounted rate.     To apply, please call [employer] at [phone], select option 4, then select option 2, and ask to apply for Patient Assistance Program.  Theodore Demark will ask your household income, and how many people are in your household.  They will quote your out-of-pocket cost based on that information.  [employer]  will also be able to set up a 26-month interest-free payment plan if needed.  Applying the monitor   Shave hair from upper left chest.  Hold abrader disc by orange tab. Rub abrader in 40 strokes over the upper left chest as indicated in your monitor instructions.  Clean area with 4 enclosed alcohol pads. Let dry.  Apply patch as indicated in monitor instructions. Patch will be placed under collarbone on left side of chest with arrow pointing upward.  Rub patch adhesive wings for 2 minutes. Remove white label marked "1". Remove the white label marked "2". Rub patch adhesive wings for 2 additional minutes.  While looking in a mirror, press and release button in center of patch. A small green light will flash 3-4 times. This will be your only indicator that the monitor has been turned on. ?  Do not shower for the first 24 hours. You may shower after the first 24 hours.  Press the button if you feel a symptom. You will hear a small click. Record Date, Time and Symptom in the Patient Logbook.  When you are ready to remove the patch, follow instructions on the last 2 pages of the Patient Logbook. Stick patch monitor onto the last page of Patient Logbook.  Place Patient Logbook in the blue and white box.  Use locking tab on box and tape box closed securely.  The blue and white box has prepaid postage on it. Please place it in the mailbox as soon as possible. Your physician should have your test results approximately 7 days after the monitor has been [facility]  Call [employer] [phone] you have questions regarding your ZIO  XT patch monitor. Call them immediately if you see an orange light blinking on your monitor.  If your monitor falls off in less than 4 days, contact our Monitor department at [phone]. ?If your monitor becomes loose or falls off after 4 days call [employer] at [phone] for suggestions on securing your monitor.?  Follow-Up: At [facility], you and your health needs are our priority.  As part of our continuing mission to provide you with exceptional heart care, we have created designated Provider Care Teams.  These Care Teams include your primary Cardiologist (physician) and Advanced Practice Providers (APPs -  Physician Assistants and Nurse Practitioners) who all work together to provide you with the care you need, when you need it.  We recommend signing up for the patient portal called "MyChart".  Sign up information is provided on this After Visit Summary.  MyChart is used to connect with patients for Virtual Visits (Telemedicine).  Patients are able to view lab/test results, encounter notes, upcoming appointments, etc.  Non-urgent messages can be sent to your provider as well.   To learn more about what you can do with MyChart, go to [email or web address].    Your next appointment:   As scheduled with Dr. Gardiner Rhyme        Signed, Donato Heinz, MD  05/11/2022 8:30 AM    [place]

## 2022-05-10 NOTE — Telephone Encounter (Signed)
The pt has been advised to begin famotidine at bedtime.  She prefers to purchase OTC.  She will be called when we get a response regarding Motegrity  [place] or team any updates?

## 2022-05-11 ENCOUNTER — Ambulatory Visit: Payer: 59 | Attending: Cardiology | Admitting: Cardiology

## 2022-05-11 ENCOUNTER — Encounter: Payer: Self-pay | Admitting: Cardiology

## 2022-05-11 ENCOUNTER — Ambulatory Visit (INDEPENDENT_AMBULATORY_CARE_PROVIDER_SITE_OTHER): Payer: 59

## 2022-05-11 VITALS — BP 125/76 | HR 72 | Ht 66.0 in | Wt 193.6 lb

## 2022-05-11 DIAGNOSIS — I350 Nonrheumatic aortic (valve) stenosis: Secondary | ICD-10-CM

## 2022-05-11 DIAGNOSIS — I351 Nonrheumatic aortic (valve) insufficiency: Secondary | ICD-10-CM | POA: Diagnosis not present

## 2022-05-11 DIAGNOSIS — R002 Palpitations: Secondary | ICD-10-CM | POA: Diagnosis not present

## 2022-05-11 DIAGNOSIS — E785 Hyperlipidemia, unspecified: Secondary | ICD-10-CM | POA: Diagnosis not present

## 2022-05-11 NOTE — Progress Notes (Unsigned)
Enrolled for [employer] to mail a ZIO XT long term holter monitor to the patients address on file.  

## 2022-05-11 NOTE — Patient Instructions (Signed)
Medication Instructions:  Your physician recommends that you continue on your current medications as directed. Please refer to the Current Medication list given to you today.  *If you need a refill on your cardiac medications before your next appointment, please call your pharmacy*   Lab Work: BMET, [place] today  If you have labs (blood work) drawn today and your tests are completely normal, you will receive your results only by: [place] (if you have MyChart) OR A paper copy in the mail If you have any lab test that is abnormal or we need to change your treatment, we will call you to review the results.   Testing/Procedures: [place]n [place]- Long Term Monitor Instructions   Your physician has requested you wear a ZIO patch monitor for _7_ days.  This is a single patch monitor.   [employer]hythm supplies one patch monitor per enrollment. Additional stickers are not available. Please do not apply patch if you will be having a Nuclear Stress Test, Echocardiogram, Cardiac CT, MRI, or Chest Xray during the period you would be wearing the monitor. The patch cannot be worn during these tests. You cannot remove and re-apply the ZIO XT patch monitor.  Your ZIO patch monitor will be sent Fed Ex from [employer] directly to your home address. It may take 3-5 days to receive your monitor after you have been enrolled.  Once you have received your monitor, please review the enclosed instructions. Your monitor has already been registered assigning a specific monitor serial # to you.  Billing and Patient Assistance Program Information   We have supplied [employer] with any of your insurance information on file for billing purposes. [employer] offers a sliding scale Patient Assistance Program for patients that do not have insurance, or whose insurance does not completely cover the cost of the ZIO monitor.   You must apply for the Patient Assistance Program to qualify for this discounted rate.     To apply, please  call IR[employer] at [phone], select option 4, then select option 2, and ask to apply for Patient Assistance Program.  Crystal Wong will ask your household income, and how many people are in your household.  They will quote your out-of-pocket cost based on that information.  [employer]hythm will also be able to set up a 56-month interest-free payment plan if needed.  Applying the monitor   Shave hair from upper left chest.  Hold abrader disc by orange tab. Rub abrader in 40 strokes over the upper left chest as indicated in your monitor instructions.  Clean area with 4 enclosed alcohol pads. Let dry.  Apply patch as indicated in monitor instructions. Patch will be placed under collarbone on left side of chest with arrow pointing upward.  Rub patch adhesive wings for 2 minutes. Remove white label marked "1". Remove the white label marked "2". Rub patch adhesive wings for 2 additional minutes.  While looking in a mirror, press and release button in center of patch. A small green light will flash 3-4 times. This will be your only indicator that the monitor has been turned on. ?  Do not shower for the first 24 hours. You may shower after the first 24 hours.  Press the button if you feel a symptom. You will hear a small click. Record Date, Time and Symptom in the Patient Logbook.  When you are ready to remove the patch, follow instructions on the last 2 pages of the Patient Logbook. Stick patch monitor onto the last page of Patient Logbook.  Place Patient Logbook in the blue and white box.  Use locking tab on box and tape box closed securely.  The blue and white box has prepaid postage on it. Please place it in the mailbox as soon as possible. Your physician should have your test results approximately 7 days after the monitor has been mailed back to [facility].  Call [place] at [phone] if you have questions regarding your ZIO XT patch monitor. Call them immediately if you see an orange  light blinking on your monitor.  If your monitor falls off in less than 4 days, contact our Monitor department at [phone]. ?If your monitor becomes loose or falls off after 4 days call [employer] at [phone] for suggestions on securing your monitor.?  Follow-Up: At [facility], you and your health needs are our priority.  As part of our continuing mission to provide you with exceptional heart care, we have created designated Provider Care Teams.  These Care Teams include your primary Cardiologist (physician) and Advanced Practice Providers (APPs -  Physician Assistants and Nurse Practitioners) who all work together to provide you with the care you need, when you need it.  We recommend signing up for the patient portal called "MyChart".  Sign up information is provided on this After Visit Summary.  MyChart is used to connect with patients for Virtual Visits (Telemedicine).  Patients are able to view lab/test results, encounter notes, upcoming appointments, etc.  Non-urgent messages can be sent to your provider as well.   To learn more about what you can do with MyChart, go to [email or web address].    Your next appointment:   As scheduled with Dr. Gardiner Rhyme

## 2022-05-11 NOTE — Addendum Note (Signed)
Addended by: Zebedee Iba on: 05/11/2022 08:32 AM   Modules accepted: Orders

## 2022-05-12 ENCOUNTER — Telehealth: Payer: Self-pay | Admitting: Pharmacy Technician

## 2022-05-12 ENCOUNTER — Other Ambulatory Visit (HOSPITAL_COMMUNITY): Payer: Self-pay

## 2022-05-12 LAB — BASIC METABOLIC PANEL
BUN/Creatinine Ratio: 17 (ref 12–28)
BUN: 16 mg/dL (ref 8–27)
CO2: 27 mmol/L (ref 20–29)
Calcium: 9.8 mg/dL (ref 8.7–10.3)
Chloride: 99 mmol/L (ref 96–106)
Creatinine, Ser: 0.96 mg/dL (ref 0.57–1.00)
Glucose: 81 mg/dL (ref 70–99)
Potassium: 4.6 mmol/L (ref 3.5–5.2)
Sodium: 139 mmol/L (ref 134–144)
eGFR: 66 mL/min/{1.73_m2} (ref >59)

## 2022-05-12 LAB — MAGNESIUM: Magnesium: 2.5 mg/dL — ABNORMAL HIGH (ref 1.6–2.3)

## 2022-05-12 NOTE — Telephone Encounter (Signed)
Patient Advocate Encounter  Received notification from [place] that prior authorization for MOTEGRITY '2MG'$  is required.   PA submitted on 3.8.24 Key [id number] Status is pending

## 2022-05-12 NOTE — Telephone Encounter (Signed)
PA has been submitted and telephone encounter has been created 

## 2022-05-12 NOTE — Telephone Encounter (Signed)
PA has been submitted, will update in additional encounter created.

## 2022-05-15 DIAGNOSIS — R002 Palpitations: Secondary | ICD-10-CM | POA: Diagnosis not present

## 2022-05-15 NOTE — Telephone Encounter (Signed)
PA Denied: Full letter in Media

## 2022-05-18 ENCOUNTER — Encounter: Payer: Self-pay | Admitting: *Deleted

## 2022-05-18 NOTE — Telephone Encounter (Signed)
PA was denied for Motegrity. Patient must try Linzess or Amitiza first. Please advise.

## 2022-05-19 ENCOUNTER — Telehealth: Payer: Self-pay | Admitting: Pharmacy Technician

## 2022-05-19 ENCOUNTER — Other Ambulatory Visit (HOSPITAL_COMMUNITY): Payer: Self-pay

## 2022-05-19 MED ORDER — LINACLOTIDE 145 MCG PO CAPS: 145.0000 ug | ORAL_CAPSULE | Freq: Every day | ORAL | 3 refills | Status: DC

## 2022-05-19 NOTE — Addendum Note (Signed)
Addended by: Elias Else on: 05/19/2022 09:26 AM   Modules accepted: Orders

## 2022-05-19 NOTE — Telephone Encounter (Unsigned)
Patient Advocate Encounter  Received notification from [facility] that prior authorization for Crystal Wong 145MCG is required.   PA submitted on 3.15.24 Key [id number]  Status is pending

## 2022-05-19 NOTE — Telephone Encounter (Signed)
Rx sent for Linzess 177mcg as recommended by Dr Tarri Glenn

## 2022-05-22 ENCOUNTER — Encounter: Payer: Self-pay | Admitting: Nurse Practitioner

## 2022-05-22 ENCOUNTER — Other Ambulatory Visit: Payer: Self-pay | Admitting: Nurse Practitioner

## 2022-05-22 DIAGNOSIS — Z1231 Encounter for screening mammogram for malignant neoplasm of breast: Secondary | ICD-10-CM

## 2022-05-23 DIAGNOSIS — D1801 Hemangioma of skin and subcutaneous tissue: Secondary | ICD-10-CM | POA: Diagnosis not present

## 2022-05-23 DIAGNOSIS — L661 Lichen planopilaris: Secondary | ICD-10-CM | POA: Diagnosis not present

## 2022-05-23 DIAGNOSIS — L570 Actinic keratosis: Secondary | ICD-10-CM | POA: Diagnosis not present

## 2022-05-25 NOTE — Telephone Encounter (Signed)
Patient Advocate Encounter  Prior Authorization for Rolan Lipa has been approved.    PA# [id number]  Effective dates: 3.21.24 through 3.21.25

## 2022-05-26 DIAGNOSIS — R002 Palpitations: Secondary | ICD-10-CM | POA: Diagnosis not present

## 2022-05-30 ENCOUNTER — Other Ambulatory Visit: Payer: Self-pay | Admitting: Nurse Practitioner

## 2022-05-30 MED ORDER — SEMAGLUTIDE (1 MG/DOSE) 4 MG/3ML ~~LOC~~ SOPN: 1.0000 mg | PEN_INJECTOR | SUBCUTANEOUS | 2 refills | Status: DC

## 2022-05-31 NOTE — Telephone Encounter (Signed)
  TRULICITY [id number] SOPN        Changed from: Semaglutide, 1 MG/DOSE, 4 MG/3ML SOPN    Pharmacy comment: Alternative Requested:NON FORMULARY DRUG; PLEASE CONTACT INSURANCE OR CONSIDER ALTERNATIVE.

## 2022-06-06 ENCOUNTER — Other Ambulatory Visit: Payer: Self-pay | Admitting: Nurse Practitioner

## 2022-06-06 ENCOUNTER — Ambulatory Visit (HOSPITAL_COMMUNITY): Payer: 59 | Attending: Cardiology

## 2022-06-06 ENCOUNTER — Telehealth: Payer: Self-pay | Admitting: Nurse Practitioner

## 2022-06-06 DIAGNOSIS — I517 Cardiomegaly: Secondary | ICD-10-CM | POA: Diagnosis not present

## 2022-06-06 DIAGNOSIS — I081 Rheumatic disorders of both mitral and tricuspid valves: Secondary | ICD-10-CM | POA: Diagnosis not present

## 2022-06-06 DIAGNOSIS — E8881 Metabolic syndrome: Secondary | ICD-10-CM

## 2022-06-06 DIAGNOSIS — I5030 Unspecified diastolic (congestive) heart failure: Secondary | ICD-10-CM | POA: Diagnosis not present

## 2022-06-06 DIAGNOSIS — I350 Nonrheumatic aortic (valve) stenosis: Secondary | ICD-10-CM | POA: Diagnosis not present

## 2022-06-06 LAB — ECHOCARDIOGRAM COMPLETE
AR max vel: 1.75 cm2
AV Area VTI: 1.66 cm2
AV Area mean vel: 1.75 cm2
AV Mean grad: 9 mmHg
AV Peak grad: 15.5 mmHg
Ao pk vel: 1.97 m/s
Area-P 1/2: 2.58 cm2
S' Lateral: 2.7 cm

## 2022-06-06 MED ORDER — SEMAGLUTIDE(0.25 OR 0.5MG/DOS) 2 MG/3ML ~~LOC~~ SOPN: 0.5000 mg | PEN_INJECTOR | SUBCUTANEOUS | 1 refills | Status: DC

## 2022-06-06 NOTE — Telephone Encounter (Signed)
Patient notified and verbalized understanding. States that her insurance is sending over a PA. Per insurance needs to say that she is not tolerating the trulicity. Causing stomach pain and severe constipation and aggravates her GERD

## 2022-06-06 NOTE — Progress Notes (Signed)
Changed trulicity to ozempic

## 2022-06-06 NOTE — Telephone Encounter (Signed)
Will change to ozempic

## 2022-06-07 NOTE — Telephone Encounter (Signed)
  TRULICITY [id number] SOPN        Changed from: Semaglutide,0.25 or 0.5MG /DOS, 2 MG/3ML SOPN    Pharmacy comment: Alternative Requested:NON FORMULARY.

## 2022-06-11 NOTE — Progress Notes (Unsigned)
Cardiology Office Note:    Date:  05/11/2022   ID:  [name], DOB 08-29-1955, MRN [id number]  PCP:  Bennie Pierini, FNP  Cardiologist:  Little Ishikawa, MD  Electrophysiologist:  None   Referring MD: Bennie Pierini, *   Chief Complaint  Patient presents with   Palpitations    History of Present Illness:    RELDA Wong is a 67 y.o. female with a hx of anxiety, depression, asthma, GERD, hyperlipidemia who presents for follow-up.  She was referred by Dr. Orvan Falconer for pre-op evaluation, initially seen on 05/19/2019.  She was referred to gastroenterology for EGD, requested cardiac evaluation prior to procedure.  She reports that she has been having a fluttering feeling in her chest that started in January.  Was occurring every day, but has not had for the last week.  Can last for 20 minutes up to hours.  In addition to chest fluttering, she sometimes avoids episodes where she feels like her heart is racing, but states that this is related to caffeine use.  Has also been having burning in her chest that she has attributed to GERD.  Has improved with increasing her GERD medications.  Reports that she exercises regularly, walks 4 miles per day and does Pilates for 30 minutes.  Denies any exertional chest pain.  Does report some dyspnea with walking up hills.  Never smoked.  Mother had MI at 43.   Reports she was told years ago she had mitral valve prolapse after she was evaluated for heart murmur  At initial clinic visit on 05/19/2019, no further work-up was recommended prior to her EGD, which she underwent on 06/09/2019.  TTE on 06/03/2019 showed LVEF 60 to 65%, normal RV function, mild aortic stenosis, mild to moderate aortic regurgitation.  Zio patch x14 days on 07/17/2019 showed no significant arrhythmias.  Calcium score on 06/03/2019 was 0.  LDL 205 on 08/19/2019 despite being on rosuvastatin 40 mg daily.  Referred to lipid clinic, started on Repatha.  Repeat lipid panel on  12/24/2019 showed marked improvement in LDL to 75.  Reported worsening palpitations and in Zio patch x3 days on 05/06/2020 showed no significant arrhythmias.  Echocardiogram on 05/18/2020 showed normal biventricular function, mild AS, mild to moderate AI.  Echocardiogram on 06/06/2022 showed EF 60 to 65%, grade 1 diastolic dysfunction, normal RV function, mild aortic regurgitation, mild aortic stenosis.  Zio patch x 7 days on 05/11/2022 showed no significant arrhythmias.  Since last clinic visit,  she reports she is doing okay.  States that she has not had palpitations for years but over the last 3 days has had multiple episodes of palpitations.  Feels like it is related to her GI issues, she has been having issues with constipation and started taking MiraLAX.  Palpitations last up to 1 minute. Denies any chest pain, dyspnea, lightheadedness, syncope, lower extremity edema.  Has been doing pilates for exercise.   Past Medical History:  Diagnosis Date   Anxiety    Asthma    Depression    GERD (gastroesophageal reflux disease)    Heart murmur    Hyperlipidemia    Insomnia     Past Surgical History:  Procedure Laterality Date   ANKLE FRACTURE SURGERY Left    Pins and screws   BREAST LUMPECTOMY Bilateral    left x 1, right x 2   CARPAL TUNNEL RELEASE Right    CARPAL TUNNEL RELEASE Left    COLONOSCOPY     KNEE ARTHROSCOPY  Bilateral    PARTIAL HYSTERECTOMY      Current Medications: Current Meds  Medication Sig   albuterol (VENTOLIN HFA) 108 (90 Base) MCG/ACT inhaler 2 puff q6 prn   betamethasone dipropionate 0.05 % lotion APPLY 1 APPLICATION TO THE SCALP NIGHTLY   budesonide-formoterol (SYMBICORT) 80-4.5 MCG/ACT inhaler Take 2 puffs first thing in am and then another 2 puffs about 12 hours later.   citalopram (CELEXA) 40 MG tablet Take 1 tablet (40 mg total) by mouth daily.   dexlansoprazole (DEXILANT) 60 MG capsule Take 1 capsule (60 mg total) by mouth daily.   doxycycline (VIBRAMYCIN) 100  MG capsule Take 100 mg by mouth daily.   Dulaglutide (TRULICITY) 0.75 MG/0.5ML SOPN Inject 0.75 mg into the skin once a week.   dutasteride (AVODART) 0.5 MG capsule    Evolocumab with Infusor (REPATHA PUSHTRONEX SYSTEM) 420 MG/3.5ML SOCT Inject 420 mg into the skin every 30 (thirty) days.   famotidine (PEPCID) 20 MG tablet Take 1 tablet (20 mg total) by mouth 2 (two) times daily.   fluticasone (FLONASE) 50 MCG/ACT nasal spray Place 2 sprays into both nostrils daily.   hydrocortisone (ANUSOL-HC) 25 MG suppository PLACE 1 SUPPOSITORY (25 MG TOTAL) RECTALLY 2 (TWO) TIMES DAILY.   lansoprazole (PREVACID) 30 MG capsule Take 1 capsule (30 mg total) by mouth 2 (two) times daily before a meal.   LORATADINE-D 24HR 10-240 MG 24 hr tablet Take 1 tablet by mouth once daily   LORazepam (ATIVAN) 1 MG tablet Take 1 tablet (1 mg total) by mouth every 8 (eight) hours as needed.   melatonin 5 MG TABS Take 5 mg by mouth at bedtime.   Prucalopride Succinate (MOTEGRITY) 2 MG TABS Take 1 tablet (2 mg total) by mouth daily.   rosuvastatin (CRESTOR) 40 MG tablet Take 1 tablet (40 mg total) by mouth daily.   VITAMIN D, CHOLECALCIFEROL, PO Take 1 tablet by mouth daily.      Allergies:   Sulfa antibiotics, Sulfamethoxazole-trimethoprim, and Elemental sulfur   Social History   Socioeconomic History   Marital status: Married    Spouse name: Not on file   Number of children: 2   Years of education: Not on file   Highest education level: Not on file  Occupational History   Occupation: retired  Tobacco Use   Smoking status: Never   Smokeless tobacco: Never  Vaping Use   Vaping Use: Never used  Substance and Sexual Activity   Alcohol use: Not Currently   Drug use: No   Sexual activity: Not on file  Other Topics Concern   Not on file  Social History Narrative   Not on file   Social Determinants of Health   Financial Resource Strain: Not on file  Food Insecurity: Not on file  Transportation Needs: Not on  file  Physical Activity: Not on file  Stress: Not on file  Social Connections: Not on file     Family History: The patient's family history includes Colon cancer (age of onset: 80) in her father; Colon polyps in her mother; Diabetes in her mother; Gallbladder disease in her daughter; Gallstones in her paternal grandmother; Heart disease in her mother. There is no history of Esophageal cancer, Stomach cancer, Rectal cancer, or Breast cancer.  ROS:   Please see the history of present illness.     All other systems reviewed and are negative.  EKGs/Labs/Other Studies Reviewed:    The following studies were reviewed today:   EKG:   05/11/22: NSR,  RBBB, LAFB, rate 72 06/07/21: Normal sinus rhythm, rate 77, right bundle branch block, left anterior fascicular block, no ST abnormalities 04/23/2020- The ekg ordered  demonstrates normal sinus rhythm, rate 79, RBBB, no ST/T wave abnormalities  Recent Labs: 03/07/2022: ALT 19; BUN 15; Creatinine, Ser 0.94; Hemoglobin 13.8; Platelets 247; Potassium 4.4; Sodium 140; TSH 1.680  Recent Lipid Panel    Component Value Date/Time   CHOL 179 03/07/2022 1229   CHOL 225 (H) 08/23/2012 1155   TRIG 114 03/07/2022 1229   TRIG 125 12/23/2013 1004   TRIG 51 08/23/2012 1155   HDL 75 03/07/2022 1229   HDL 58 12/23/2013 1004   HDL 59 08/23/2012 1155   CHOLHDL 2.4 03/07/2022 1229   LDLCALC 84 03/07/2022 1229   LDLCALC 163 (H) 12/23/2013 1004   LDLCALC 156 (H) 08/23/2012 1155    Physical Exam:    VS:  BP 125/76   Pulse 72   Ht 5\' 6"  (1.676 m)   Wt 193 lb 9.6 oz (87.8 kg)   SpO2 97%   BMI 31.25 kg/m     Wt Readings from Last 3 Encounters:  05/11/22 193 lb 9.6 oz (87.8 kg)  05/02/22 194 lb (88 kg)  03/28/22 196 lb 6.4 oz (89.1 kg)     GEN:  Well nourished, well developed in no acute distress HEENT: Normal NECK: No JVD; No carotid bruits CARDIAC: RRR, 2/6 systolic heart murmur RESPIRATORY:  Clear to auscultation without rales, wheezing or rhonchi   ABDOMEN: Soft, non-tender, non-distended MUSCULOSKELETAL:  No edema; No deformity  SKIN: Warm and dry NEUROLOGIC:  Alert and oriented x 3 PSYCHIATRIC:  Normal affect   ASSESSMENT:    1. Palpitations   2. Aortic valve stenosis, etiology of cardiac valve disease unspecified   3. Aortic valve insufficiency, etiology of cardiac valve disease unspecified   4. Hyperlipidemia, unspecified hyperlipidemia type     PLAN:    Palpitations: Zio patch x14 days on 07/17/2019 showed no significant arrhythmias.  At clinic visit 04/23/2020, reports now having frequent palpitations, different in character than palpitation she was having before.  Zio patch x3 days on 05/06/2020 showed no significant arrhythmias.  Echocardiogram on 05/18/2020 showed normal biventricular function, mild AS, mild to moderate AI.  Reported worsening palpitations and underwent repeat Zio patch x 7 days on 05/11/2022 which showed no significant arrhythmias.  Aortic stenosis/regurgitation:  TTE on 06/03/2019 showed LVEF 60 to 65%, normal RV function, mild aortic stenosis, mild to moderate aortic regurgitation.  Echocardiogram on 05/18/2020 showed normal biventricular function, mild AS, mild to moderate AI. Echocardiogram on 06/06/2022 showed EF 60 to 65%, grade 1 diastolic dysfunction, normal RV function, mild aortic regurgitation, mild aortic stenosis.  Zio patch x 7 days on 05/11/2022 showed no significant arrhythmias.  Hyperlipidemia: LDL 205 on 08/19/2019 despite being on rosuvastatin 40 mg daily.  Referred to lipid clinic, started on Repatha.  Repeat lipid panel on 12/24/2019 showed marked improvement in LDL to 75.  Calcium score 0 on 06/03/2019. -LDL 84 on 03/07/2022, continue Repatha  RTC in ***    Medication Adjustments/Labs and Tests Ordered: Current medicines are reviewed at length with the patient today.  Concerns regarding medicines are outlined above.  Orders Placed This Encounter  Procedures   Basic metabolic panel   Magnesium    LONG TERM MONITOR (3-14 DAYS)   No orders of the defined types were placed in this encounter.   Patient Instructions  Medication Instructions:  Your physician recommends that you continue on your  current medications as directed. Please refer to the Current Medication list given to you today.  *If you need a refill on your cardiac medications before your next appointment, please call your pharmacy*   Lab Work: BMET, [place] today  If you have labs (blood work) drawn today and your tests are completely normal, you will receive your results only by: MyChart Message (if you have MyChart) OR A paper copy in the mail If you have any lab test that is abnormal or we need to change your treatment, we will call you to review the results.   Testing/Procedures: Christena DeemZIO XT- Long Term Monitor Instructions   Your physician has requested you wear a ZIO patch monitor for _7_ days.  This is a single patch monitor.   [employer] supplies one patch monitor per enrollment. Additional stickers are not available. Please do not apply patch if you will be having a Nuclear Stress Test, Echocardiogram, Cardiac CT, MRI, or Chest Xray during the period you would be wearing the monitor. The patch cannot be worn during these tests. You cannot remove and re-apply the ZIO XT patch monitor.  Your ZIO patch monitor will be sent Fed Ex from [employer] directly to your home address. It may take 3-5 days to receive your monitor after you have been enrolled.  Once you have received your monitor, please review the enclosed instructions. Your monitor has already been registered assigning a specific monitor serial # to you.  Billing and Patient Assistance Program Information   We have supplied [employer] with any of your insurance information on file for billing purposes. [employer] offers a sliding scale Patient Assistance Program for patients that do not have insurance, or whose insurance does not completely cover the cost of the  ZIO monitor.   You must apply for the Patient Assistance Program to qualify for this discounted rate.     To apply, please call [employer] at [phone], select option 4, then select option 2, and ask to apply for Patient Assistance Program.  [employer] will ask your household income, and how many people are in your household.  They will quote your out-of-pocket cost based on that information.  [employer] will also be able to set up a 522-month, interest-free payment plan if needed.  Applying the monitor   Shave hair from upper left chest.  Hold abrader disc by orange tab. Rub abrader in 40 strokes over the upper left chest as indicated in your monitor instructions.  Clean area with 4 enclosed alcohol pads. Let dry.  Apply patch as indicated in monitor instructions. Patch will be placed under collarbone on left side of chest with arrow pointing upward.  Rub patch adhesive wings for 2 minutes. Remove white label marked "1". Remove the white label marked "2". Rub patch adhesive wings for 2 additional minutes.  While looking in a mirror, press and release button in center of patch. A small green light will flash 3-4 times. This will be your only indicator that the monitor has been turned on. ?  Do not shower for the first 24 hours. You may shower after the first 24 hours.  Press the button if you feel a symptom. You will hear a small click. Record Date, Time and Symptom in the Patient Logbook.  When you are ready to remove the patch, follow instructions on the last 2 pages of the Patient Logbook. Stick patch monitor onto the last page of Patient Logbook.  Place Patient Logbook in the blue and white box.  Use locking tab on box and tape box closed securely.  The blue and white box has prepaid postage on it. Please place it in the mailbox as soon as possible. Your physician should have your test results approximately 7 days after the monitor has been mailed back to [facility].  Call [facility] Customer Care  at [phone] if you have questions regarding your ZIO XT patch monitor. Call them immediately if you see an orange light blinking on your monitor.  If your monitor falls off in less than 4 days, contact our Monitor department at [phone]. ?If your monitor becomes loose or falls off after 4 days call [employer] at [phone] for suggestions on securing your monitor.?  Follow-Up: At [facility], you and your health needs are our priority.  As part of our continuing mission to provide you with exceptional heart care, we have created designated Provider Care Teams.  These Care Teams include your primary Cardiologist (physician) and Advanced Practice Providers (APPs -  Physician Assistants and Nurse Practitioners) who all work together to provide you with the care you need, when you need it.  We recommend signing up for the patient portal called "MyChart".  Sign up information is provided on this After Visit Summary.  MyChart is used to connect with patients for Virtual Visits (Telemedicine).  Patients are able to view lab/test results, encounter notes, upcoming appointments, etc.  Non-urgent messages can be sent to your provider as well.   To learn more about what you can do with MyChart, go to [email or web address].    Your next appointment:   As scheduled with Dr. Bjorn Pippin        Signed, Little Ishikawa, MD  05/11/2022 8:30 AM    [facility]

## 2022-06-12 ENCOUNTER — Ambulatory Visit
Admission: RE | Admit: 2022-06-12 | Discharge: 2022-06-12 | Disposition: A | Discharge status: Home / Self Care | Payer: 59 | Source: Ambulatory Visit | Attending: Nurse Practitioner | Admitting: Nurse Practitioner

## 2022-06-12 DIAGNOSIS — Z1231 Encounter for screening mammogram for malignant neoplasm of breast: Secondary | ICD-10-CM | POA: Diagnosis not present

## 2022-06-13 ENCOUNTER — Other Ambulatory Visit (HOSPITAL_COMMUNITY): Payer: Self-pay

## 2022-06-13 NOTE — Telephone Encounter (Signed)
Metabolic syndrome, BMI

## 2022-06-15 ENCOUNTER — Other Ambulatory Visit: Payer: Self-pay | Admitting: Nurse Practitioner

## 2022-06-15 ENCOUNTER — Other Ambulatory Visit (HOSPITAL_COMMUNITY): Payer: Self-pay

## 2022-06-15 ENCOUNTER — Encounter: Payer: Self-pay | Admitting: Cardiology

## 2022-06-15 ENCOUNTER — Ambulatory Visit: Payer: 59 | Attending: Cardiology | Admitting: Cardiology

## 2022-06-15 VITALS — BP 131/79 | HR 82 | Ht 66.0 in | Wt 195.0 lb

## 2022-06-15 DIAGNOSIS — R002 Palpitations: Secondary | ICD-10-CM

## 2022-06-15 DIAGNOSIS — I350 Nonrheumatic aortic (valve) stenosis: Secondary | ICD-10-CM

## 2022-06-15 DIAGNOSIS — E785 Hyperlipidemia, unspecified: Secondary | ICD-10-CM

## 2022-06-15 DIAGNOSIS — I351 Nonrheumatic aortic (valve) insufficiency: Secondary | ICD-10-CM | POA: Diagnosis not present

## 2022-06-15 DIAGNOSIS — E8881 Metabolic syndrome: Secondary | ICD-10-CM

## 2022-06-15 NOTE — Telephone Encounter (Signed)
Pharmacy Patient Advocate Encounter   Received notification that prior authorization for Ozempic (0.25 or 0.5 MG/DOSE) 2MG /3ML pen-injectors is required/requested.  Per Test Claim: Plan/benefit exclusion   PA submitted on 06/15/22 to (ins) [employer] via [employer] or (Medicaid) confirmation # [id number] Status is pending

## 2022-06-15 NOTE — Telephone Encounter (Signed)
  OZEMPIC, 0.25 OR 0.5 MG/DOSE, 2 MG/3ML SOPN   Pharmacy comment: Alternative Requested:MED NOT COVERED NEEDS PRIOR AUTH

## 2022-06-15 NOTE — Patient Instructions (Signed)
Medication Instructions:  Your physician recommends that you continue on your current medications as directed. Please refer to the Current Medication list given to you today.  *If you need a refill on your cardiac medications before your next appointment, please call your pharmacy*  Follow-Up: At [facility], you and your health needs are our priority.  As part of our continuing mission to provide you with exceptional heart care, we have created designated Provider Care Teams.  These Care Teams include your primary Cardiologist (physician) and Advanced Practice Providers (APPs -  Physician Assistants and Nurse Practitioners) who all work together to provide you with the care you need, when you need it.  We recommend signing up for the patient portal called "MyChart".  Sign up information is provided on this After Visit Summary.  MyChart is used to connect with patients for Virtual Visits (Telemedicine).  Patients are able to view lab/test results, encounter notes, upcoming appointments, etc.  Non-urgent messages can be sent to your provider as well.   To learn more about what you can do with MyChart, go to [email or web address].    Your next appointment:   12 month(s)  Provider:   Christopher L Schumann, MD      

## 2022-06-16 ENCOUNTER — Telehealth: Payer: Self-pay | Admitting: Nurse Practitioner

## 2022-06-16 NOTE — Telephone Encounter (Signed)
Patient states that she is still waiting to hear back on the approval of her ozempic. Wanted to go ahead and schedule an appt just in case she did need to be seen

## 2022-06-16 NOTE — Telephone Encounter (Signed)
Pt called requesting to make an appt to see MMM on 4/18. Says she has been trying to get Rx for Ozempic and was told by the pharmacy that she needed to make an appt to see PCP.  MMM is full on 4/18 and pt is aware. Wants to know if MMM will work her in?

## 2022-06-19 DIAGNOSIS — M1990 Unspecified osteoarthritis, unspecified site: Secondary | ICD-10-CM | POA: Diagnosis not present

## 2022-06-19 DIAGNOSIS — Z791 Long term (current) use of non-steroidal anti-inflammatories (NSAID): Secondary | ICD-10-CM | POA: Diagnosis not present

## 2022-06-19 DIAGNOSIS — J45909 Unspecified asthma, uncomplicated: Secondary | ICD-10-CM | POA: Diagnosis not present

## 2022-06-19 DIAGNOSIS — R32 Unspecified urinary incontinence: Secondary | ICD-10-CM | POA: Diagnosis not present

## 2022-06-19 DIAGNOSIS — E1122 Type 2 diabetes mellitus with diabetic chronic kidney disease: Secondary | ICD-10-CM | POA: Diagnosis not present

## 2022-06-19 DIAGNOSIS — K5900 Constipation, unspecified: Secondary | ICD-10-CM | POA: Diagnosis not present

## 2022-06-19 DIAGNOSIS — Z8249 Family history of ischemic heart disease and other diseases of the circulatory system: Secondary | ICD-10-CM | POA: Diagnosis not present

## 2022-06-19 DIAGNOSIS — K219 Gastro-esophageal reflux disease without esophagitis: Secondary | ICD-10-CM | POA: Diagnosis not present

## 2022-06-19 DIAGNOSIS — E785 Hyperlipidemia, unspecified: Secondary | ICD-10-CM | POA: Diagnosis not present

## 2022-06-19 DIAGNOSIS — Z6830 Body mass index (BMI) 30.0-30.9, adult: Secondary | ICD-10-CM | POA: Diagnosis not present

## 2022-06-19 DIAGNOSIS — F419 Anxiety disorder, unspecified: Secondary | ICD-10-CM | POA: Diagnosis not present

## 2022-06-19 DIAGNOSIS — Z809 Family history of malignant neoplasm, unspecified: Secondary | ICD-10-CM | POA: Diagnosis not present

## 2022-06-20 ENCOUNTER — Telehealth: Payer: Self-pay

## 2022-06-20 NOTE — Telephone Encounter (Signed)
Insurance is still denying medication

## 2022-06-20 NOTE — Telephone Encounter (Signed)
Patient Advocate Encounter  Received a fax from [employer] regarding Prior Authorization for Ozempic.   Authorization has been DENIED due to   Determination letter attached to patient chart

## 2022-06-21 NOTE — Telephone Encounter (Signed)
Patient aware and verbalizes understanding. 

## 2022-06-22 ENCOUNTER — Ambulatory Visit: Payer: 59 | Admitting: Nurse Practitioner

## 2022-06-22 NOTE — Telephone Encounter (Signed)
Trulicity and Ozempic are not FDA approved for patients without diabetes. PA was denied for Ozempic and Trulicity, documented in separate encounter.

## 2022-07-05 ENCOUNTER — Telehealth: Payer: 59 | Admitting: Physician Assistant

## 2022-07-05 DIAGNOSIS — W57XXXA Bitten or stung by nonvenomous insect and other nonvenomous arthropods, initial encounter: Secondary | ICD-10-CM | POA: Diagnosis not present

## 2022-07-05 DIAGNOSIS — S80869A Insect bite (nonvenomous), unspecified lower leg, initial encounter: Secondary | ICD-10-CM

## 2022-07-05 MED ORDER — DOXYCYCLINE HYCLATE 100 MG PO TABS: 200.0000 mg | ORAL_TABLET | Freq: Once | ORAL | 0 refills | Status: AC

## 2022-07-05 NOTE — Progress Notes (Signed)
E-Visit for Tick Bite  Thank you for describing your tick bite, Here is how we plan to help! Based on the information that you shared with me it looks like you have A tick that bite that we will treat with a short course of doxycycline.  In most cases a tick bite is painless and does not itch.  Most tick bites in which the tick is quickly removed do not require prescriptions. Ticks can transmit several diseases if they are infected and remain attacked to your skin. Therefore the length that the tick was attached and any symptoms you have experienced after the bite are import to accurately develop your custom treatment plan. In most cases a single dose of doxycycline may prevent the development of a more serious condition.  Based on your information I have Provided a home care guide for tick bites and  instructions on when to call for help. and I have sent a single dose of doxycycline to the pharmacy you selected. Please make sure that you selected a pharmacy that is open now.  Which ticks  are associated with illness?  The Wood Tick (dog tick) is the size of a watermelon seed and can sometimes transmit Rocky Mountain spotted fever and [place] tick fever.   The Deer Tick (black-legged tick) is between the size of a poppy seed (pin head) and an apple seed, and can sometimes transmit Lyme disease.  A brown to black tick with a white splotch on its back is likely a female Amblyomma americanum ([place] Star tick). This tick has been associated with Southern Tick Associated illness ( STARI)  Lyme disease has become the most common tick-borne illness in the [place]. The risk of Lyme disease following a recognized deer tick bite is estimated to be 1%.  The majority of cases of Lyme disease start with a bull's eye rash at the site of the tick bite. The rash can occur days to weeks (typically 7-10 days) after a tick bite. Treatment with antibiotics is indicated if this rash appears. Flu-like symptoms  may accompany the rash, including: fever, chills, headaches, muscle aches, and fatigue. Removing ticks promptly may prevent tick borne disease.  What can be used to prevent Tick Bites?  Insect repellant with at leas 20% DEET. Wearing long pants with sock and shoes. Avoiding tall grass and heavily wooded areas. Checking your skin after being outdoors. Shower with a washcloth after outdoor exposures.  HOME CARE ADVICE FOR TICK BITE  Wood Tick Removal:  Use a pair of tweezers and grasp the wood tick close to the skin (on its head). Pull the wood tick straight upward without twisting or crushing it. Maintain a steady pressure until it releases its grip.   If tweezers aren't available, use fingers, a loop of thread around the jaws, or a needle between the jaws for traction.  Note: covering the tick with petroleum jelly, nail polish or rubbing alcohol doesn't work. Neither does touching the tick with a hot or cold object. Tiny Deer Tick Removal:   Needs to be scraped off with a knife blade or credit card edge. Place tick in a sealed container (e.g. glass jar, zip lock plastic bag), in case your doctor wants to see it. Tick's Head Removal:  If the wood tick's head breaks off in the skin, it must be removed. Clean the skin. Then use a sterile needle to uncover the head and lift it out or scrape it off.  If a very small piece   of the head remains, the skin will eventually slough it off. Antibiotic Ointment:  Wash the wound and your hands with soap and water after removal to prevent catching any tick disease.  Apply an over the counter antibiotic ointment (e.g. bacitracin) to the bite once. Expected Course: Tick bites normally don't itch or hurt. That's why they often go unnoticed. Call Your Doctor If:  You can't remove the tick or the tick's head Fever, a severe head ache, or rash occur in the next 2 weeks Bite begins to look infected Lyme's disease is common in your area You have not had a  tetanus in the last 10 years Your current symptoms become worse    MAKE SURE YOU  Understand these instructions. Will watch your condition. Will get help right away if you are not doing well or get worse.    Thank you for choosing an e-visit.  Your e-visit answers were reviewed by a board certified advanced clinical practitioner to complete your personal care plan. Depending upon the condition, your plan could have included both over the counter or prescription medications.  Please review your pharmacy choice. Make sure the pharmacy is open so you can pick up prescription now. If there is a problem, you may contact your provider through MyChart messaging and have the prescription routed to another pharmacy.  Your safety is important to us. If you have drug allergies check your prescription carefully.   For the next 24 hours you can use MyChart to ask questions about today's visit, request a non-urgent call back, or ask for a work or school excuse. You will get an email in the next two days asking about your experience. I hope that your e-visit has been valuable and will speed your recovery.  

## 2022-07-05 NOTE — Telephone Encounter (Signed)
Pharmacy Patient Advocate Encounter  Received notification from [facility] [employer] that the request for prior authorization for Ozempic has been denied due to the policy states that this medication may be approved when: -The member has a clinical condition or needs a specific dosage form for which there is no alternative on the formulary OR -The listed formulary alternatives are not recommended based on published guidelines or clinical literature OR -The formulary alternatives will likely be ineffective or less effective for the member OR -The formulary alternatives will likely cause an adverse effect OR -The member is unable to take the required number of formulary alternatives for the given diagnosis due to a trial and inadequate treatment response or contraindication OR -The member has tried and failed the required number of formulary alternatives. Based on the policy and the information we have, your request is denied. We did not receive any documentation that you meet any of the criteria outlined above. Formulary alternative(s) are Victoza, Trulicity. Requirement: 3 in a class with 3 or more alternatives, 2 in a class with 2 alternatives, or 1 in a class with only 1 alternative. Please refer to your plan documents for a complete list of alternatives..    Please be advised we currently do not have a Pharmacist to review denials, therefore you will need to process appeals accordingly as needed. Thanks for your support at this time.   You may call [phone] or fax [phone], to appeal.

## 2022-07-05 NOTE — Progress Notes (Signed)
Message sent to patient requesting further input regarding current symptoms. Awaiting patient response.  

## 2022-07-05 NOTE — Progress Notes (Signed)
I have spent 5 minutes in review of e-visit questionnaire, review and updating patient chart, medical decision making and response to patient.   Mabrey Howland Cody Kaylamarie Swickard, PA-C    

## 2022-07-06 MED ORDER — SEMAGLUTIDE(0.25 OR 0.5MG/DOS) 2 MG/3ML ~~LOC~~ SOPN: 0.5000 mg | PEN_INJECTOR | SUBCUTANEOUS | 3 refills | Status: DC

## 2022-07-06 NOTE — Telephone Encounter (Signed)
Patient is on trulicity with side effects. Nausea, vomiting and GERD. Changing to ozempic. Prescription sent in. Will have to do prior auth for approval.  Meds ordered this encounter  Medications   Semaglutide,0.25 or 0.5MG /DOS, 2 MG/3ML SOPN    Sig: Inject 0.5 mg into the skin once a week.    Dispense:  3 mL    Refill:  3    Failed trulicity    Order Specific Question:   Supervising Provider    Answer:   Nils Pyle [[id number]]   [name], FNP

## 2022-07-06 NOTE — Addendum Note (Signed)
Addended by: Bennie Pierini on: 07/06/2022 01:45 PM   Modules accepted: Orders

## 2022-07-07 ENCOUNTER — Telehealth: Payer: Self-pay

## 2022-07-07 ENCOUNTER — Telehealth: Payer: Self-pay | Admitting: Nurse Practitioner

## 2022-07-07 NOTE — Telephone Encounter (Signed)
Resubmitted with proof that pt has tried Trulicity and is unable to tolerate side effects. Medication is usually not approved for people without type 2 diabetes, I do see that she had an A1c done that was 6.9 (in diabetic range) so I did submit PA under ICD code for type 2 diabetes to help with approval chances. Will document and route in separate encounter.

## 2022-07-07 NOTE — Telephone Encounter (Signed)
Give it a few more days. Doxycycline is prescripiton of choice for tick bite

## 2022-07-07 NOTE — Telephone Encounter (Signed)
Patient Advocate Encounter   Received notification from [employer] that prior authorization for Ozempic is required.   PA submitted on 07/07/2022 Key [id number] Status is pending

## 2022-07-07 NOTE — Telephone Encounter (Signed)
Patient aware, if no better by Monday or Tuesday she will call for an appointment.

## 2022-07-07 NOTE — Telephone Encounter (Signed)
Had an Evisit on 5/1 and was prescribed doxy. Do you want to see her in office?

## 2022-07-11 ENCOUNTER — Ambulatory Visit: Payer: 59 | Admitting: Nurse Practitioner

## 2022-07-11 VITALS — BP 118/73 | HR 82 | Temp 97.5°F | Resp 20 | Ht 66.0 in | Wt 189.0 lb

## 2022-07-11 DIAGNOSIS — Z7985 Long-term (current) use of injectable non-insulin antidiabetic drugs: Secondary | ICD-10-CM | POA: Insufficient documentation

## 2022-07-11 DIAGNOSIS — W57XXXA Bitten or stung by nonvenomous insect and other nonvenomous arthropods, initial encounter: Secondary | ICD-10-CM

## 2022-07-11 DIAGNOSIS — S70362A Insect bite (nonvenomous), left thigh, initial encounter: Secondary | ICD-10-CM

## 2022-07-11 DIAGNOSIS — E119 Type 2 diabetes mellitus without complications: Secondary | ICD-10-CM

## 2022-07-11 MED ORDER — SEMAGLUTIDE(0.25 OR 0.5MG/DOS) 2 MG/3ML ~~LOC~~ SOPN: 0.5000 mg | PEN_INJECTOR | SUBCUTANEOUS | 3 refills | Status: DC

## 2022-07-11 NOTE — Patient Instructions (Signed)
Tick Bite Information, Adult  Ticks are insects that draw blood for food. They climb onto people and animals that brush against the leaves and grasses that they live in. They then bite and attach to the skin. Most ticks are harmless, but some ticks may carry germs that can cause disease. These germs are spread to a person through a bite. To lower your risk of getting a disease from a tick bite, make sure you: Take steps to prevent tick bites. Check for ticks after being outdoors where ticks live. Watch for symptoms of disease if a tick attached to you or if you think a tick bit you. How can I prevent tick bites? Take these steps to help prevent tick bites when you go outdoors in an area where ticks live: Before you go outdoors: Wear long sleeves and long pants to protect your skin from ticks. Wear light-colored clothing so you can see ticks easier. Tuck your pant legs into your socks. Apply insect repellent that has DEET (20% or higher), picaridin, or IR3535 in it to the following areas: Any bare skin. Avoid areas around the eyes and mouth. Edges of clothing, like the top of your boots, the bottom of your pant legs, and your sleeve cuffs. Consider applying an insect repellant that contains permethrin. Follow the instructions on the label. Do not apply permethrin directly to the skin. Instead, apply to the following areas: Clothing and shoes. Outdoor gear and tents. When you are outdoors: Avoid walking through areas with long grass. If you are walking on a trail, stay in the middle of the trail so your skin, hair, and clothing do not touch the bushes. Check for ticks on your clothing, hair, and skin often while you are outdoors. Check again before you go inside. When you go indoors: Check your clothing for ticks. Tumble dry clothes in a dryer on high heat for at least 10 minutes. If clothes are damp, additional time may be needed. If clothes require washing, use hot water. Check your gear and  pets. Shower soon after being outdoors. Check your body for ticks. Do a full body check using a mirror. Be sure to check your scalp, neck, armpits, waist, groin, and joint areas. These are the spots where ticks attach themselves most often. What is the best way to remove a tick?  Remove the tick as soon as possible. Removing it can prevent germs from passing to your body. Do not remove the tick with your bare fingers. Do not try to remove a tick with heat, alcohol, petroleum jelly, or fingernail polish. These things can cause the tick to salivate and regurgitate into your bloodstream, increasing your risk of getting a disease. To remove a tick that is crawling on your skin: Go outside and brush the tick off. Use tape or a lint roller. To remove a tick that is attached to your skin: Wash your hands. If you have gloves, put them on. Use a fine-tipped tweezer, curved forceps, or a tick-removal tool to gently grasp the tick as close to your skin and the tick's head as possible. Gently pull with a steady, upward, and even pressure until the tick lets go. While removing the tick: Take care to keep the tick's head attached to its body. Do not twist or jerk the tick. This can make the tick's head or mouth parts break off and stay in your skin. If this happens, try to remove the mouth parts with tweezers. If you cannot remove them, leave   the area alone and let the skin heal. Do not squeeze or crush the tick's body. This could force disease-carrying fluids from the tick into your body. What should I do after removing a tick? Clean the bite area and your hands with soap and water, rubbing alcohol, or an iodine scrub. If an antiseptic cream or ointment is available, put a small amount on the bite area. Wash and disinfect any tools that you used to remove the tick. How should I dispose of a tick? To dispose of a live tick, use one of these methods: Place it in rubbing alcohol. Place it in a sealed bag  or container, and throw it away. Wrap it tightly in tape, and throw it away. Flush it down the toilet. Where to find more information Centers for Disease Control and Prevention: cdc.gov/ticks [place] Environmental Protection Agency: epa.gov/insect-repellents Contact a health care provider if: You have symptoms of a disease after a tick bite. Symptoms of a tick-borne disease can occur from moments after the tick bites to 30 days after a tick is removed. Symptoms include: Fever or chills. A red rash that makes a circle (bull's-eye rash) in the bite area. Redness and swelling in the bite area. Headache or stiff neck. Muscle, joint, or bone pain. Abnormal tiredness. Numbness in your legs or trouble walking or moving your legs. Tender or swollen lymph glands. Abdominal pain, vomiting, diarrhea, or weight loss. Get help right away if: You are not able to remove a tick. You have muscle weakness or paralysis. Your symptoms get worse or you experience new symptoms. You find an engorged tick on your skin and you are in an area where there is a higher risk of disease from ticks. Summary Ticks may carry germs that can spread to a person through a bite. These germs can cause disease. Wear protective clothing and use insect repellent to prevent tick bites. Follow the instructions on the label. If you find a tick on your body, remove it as soon as possible. If the tick is attached, do not try to remove it with heat, alcohol, petroleum jelly, or fingernail polish. If you have symptoms of a disease after being bitten by a tick, contact a health care provider. This information is not intended to replace advice given to you by your health care provider. Make sure you discuss any questions you have with your health care provider. Document Revised: 05/23/2021 Document Reviewed: 05/23/2021 Elsevier Patient Education  2023 Elsevier Inc.  

## 2022-07-11 NOTE — Progress Notes (Signed)
   Subjective:    Patient ID: Crystal Wong, female    DOB: 04/29/1955, 67 y.o.   MRN: [id number]   Chief Complaint: Tick Removal (Inner left thigh) and Right toe pain   HPI  Patient Active Problem List   Diagnosis Date Noted   Cough variant asthma 04/24/2016   BMI 33.0-33.9,adult 03/02/2016   Mixed hyperlipidemia 08/23/2012   Insomnia 08/23/2012   GERD (gastroesophageal reflux disease) 08/23/2012   Depression 08/23/2012   GAD (generalized anxiety disorder) 08/23/2012   Patient removed a tick off of her left upper thigh last week. She did and evisit and was given doxycycline. Here husband is concerned because there is a knot where tick was.    Review of Systems  Constitutional:  Negative for diaphoresis.  Eyes:  Negative for pain.  Respiratory:  Negative for shortness of breath.   Cardiovascular:  Negative for chest pain, palpitations and leg swelling.  Gastrointestinal:  Negative for abdominal pain.  Endocrine: Negative for polydipsia.  Skin:  Negative for rash.  Neurological:  Negative for dizziness, weakness and headaches.  Hematological:  Does not bruise/bleed easily.  All other systems reviewed and are negative.      Objective:   Physical Exam Constitutional:      Appearance: Normal appearance.  Skin:    General: Skin is warm.     Comments: 2cm annular bluish area on left upper inner thigh.  Neurological:     General: No focal deficit present.     Mental Status: She is alert and oriented to person, place, and time.  Psychiatric:        Mood and Affect: Mood normal.        Behavior: Behavior normal.     BP 118/73   Pulse 82   Temp (!) 97.5 F (36.4 C) (Temporal)   Resp 20   Ht 5\' 6"  (1.676 m)   Wt 189 lb (85.7 kg)   SpO2 95%   BMI 30.51 kg/m        Assessment & Plan:   Crystal Wong in today with chief complaint of Tick Removal (Inner left thigh) and Right toe pain   1. Tick bite of left thigh, initial encounter Report any new tick  bites Should not need anything- had prophylactic treatment.    The above assessment and management plan was discussed with the patient. The patient verbalized understanding of and has agreed to the management plan. Patient is aware to call the clinic if symptoms persist or worsen. Patient is aware when to return to the clinic for a follow-up visit. Patient educated on when it is appropriate to go to the emergency department.   Crystal Daphine Deutscher, FNP

## 2022-07-11 NOTE — Telephone Encounter (Signed)
Pharmacy Patient Advocate Encounter  Received notification from [facility] [employer] that the request for prior authorization for Ozempic has been denied due to .    Please be advised we currently do not have a Pharmacist to review denials, therefore you will need to process appeals accordingly as needed. Thanks for your support at this time.     

## 2022-07-12 ENCOUNTER — Telehealth: Payer: Self-pay | Admitting: Nurse Practitioner

## 2022-07-12 NOTE — Telephone Encounter (Signed)
Pt called requesting to speak with MMM nurse regarding EMS having to come to her home last night. Was told that her BS had dropped. Pt uses Trulicity. Needs advise. BS was 85 at the time of visit last night. Pt says she was feeling better by time EMS got there. But says before EMS got there she had turned very cold and felt like her adrenalin went crazy.

## 2022-07-12 NOTE — Telephone Encounter (Signed)
Pt took trulicity injection after dinner. Pt only ate crackers w/ cheese yesterday morning and a burger for dinner. Shortly after pt began having chills not feeling right. Husband called ems. BS was 85 per ems. Says her trulicity is making her nauseous, but she is ok with that. Has been taking trulicity for about 3 months. Not able to check BS at home. Pt does not feel like trulicity is working. However, Pt is feeling better on trulicity than before. Wants ozempic like previously discussed with MMM.

## 2022-07-13 NOTE — Telephone Encounter (Signed)
Insurance will not approve ozempic.  85 blood sugar is nothing to be concerned about. Trulicity must be working or blood sugar would not have been down some. I sent in ozempic prescription and they refused it again. If can't tolerate trulicity will have to try a whole different las of meds and they will not help with weight loss.

## 2022-07-13 NOTE — Telephone Encounter (Signed)
Pt has been made aware of [facility]'s recommendations and understood. Aware to call back for an appt if her symptoms return.

## 2022-07-20 ENCOUNTER — Other Ambulatory Visit: Payer: Self-pay | Admitting: Cardiology

## 2022-07-20 ENCOUNTER — Encounter: Payer: Self-pay | Admitting: Family Medicine

## 2022-07-20 ENCOUNTER — Ambulatory Visit: Payer: 59 | Admitting: Family Medicine

## 2022-07-20 VITALS — BP 115/64 | HR 77 | Temp 98.1°F | Ht 66.0 in | Wt 188.0 lb

## 2022-07-20 DIAGNOSIS — E782 Mixed hyperlipidemia: Secondary | ICD-10-CM

## 2022-07-20 DIAGNOSIS — N760 Acute vaginitis: Secondary | ICD-10-CM

## 2022-07-20 LAB — WET PREP FOR TRICH, YEAST, CLUE
Clue Cell Exam: NEGATIVE
Trichomonas Exam: NEGATIVE
Yeast Exam: NEGATIVE

## 2022-07-20 MED ORDER — FLUCONAZOLE 150 MG PO TABS
ORAL_TABLET | ORAL | 0 refills | Status: DC
Start: 2022-07-20 — End: 2022-08-01

## 2022-07-20 NOTE — Progress Notes (Signed)
   Acute Office Visit  Subjective:     Patient ID: Crystal Wong, female    DOB: [birth year], 67 y.o.   MRN: [id number]  Chief Complaint  Patient presents with   Vaginitis    Vaginal Discharge The patient's primary symptoms include vaginal discharge. The patient's pertinent negatives include no genital itching, genital lesions, genital odor, genital rash, missed menses, pelvic pain or vaginal bleeding. Primary symptoms comment: soreness. This is a new problem. The current episode started in the past 7 days. The problem has been unchanged. The problem affects both sides. She is not pregnant. Pertinent negatives include no abdominal pain, anorexia, back pain, chills, diarrhea, discolored urine, dysuria, fever, flank pain, frequency, hematuria, nausea, painful intercourse, sore throat, urgency or vomiting. The vaginal discharge was milky and white. There has been no bleeding. Nothing aggravates the symptoms. She has tried nothing for the symptoms.   Reports hx of yeast vaginitis with same symptoms. She recently was also on antibiotics.   Review of Systems  Constitutional:  Negative for chills and fever.  HENT:  Negative for sore throat.   Gastrointestinal:  Negative for abdominal pain, anorexia, diarrhea, nausea and vomiting.  Genitourinary:  Positive for vaginal discharge. Negative for dysuria, flank pain, frequency, hematuria, missed menses, pelvic pain and urgency.  Musculoskeletal:  Negative for back pain.        Objective:    BP 115/64   Pulse 77   Temp 98.1 F (36.7 C) (Temporal)   Ht 5\' 6"  (1.676 m)   Wt 188 lb (85.3 kg)   SpO2 97%   BMI 30.34 kg/m    Physical Exam Vitals and nursing note reviewed.  Constitutional:      General: She is not in acute distress.    Appearance: She is not ill-appearing, toxic-appearing or diaphoretic.  Pulmonary:     Effort: Pulmonary effort is normal. No respiratory distress.  Musculoskeletal:     Right lower leg: No edema.     Left  lower leg: No edema.  Skin:    General: Skin is warm and dry.  Neurological:     General: No focal deficit present.     Mental Status: She is alert and oriented to person, place, and time.  Psychiatric:        Mood and Affect: Mood normal.        Behavior: Behavior normal.        Thought Content: Thought content normal.        Judgment: Judgment normal.     No results found for any visits on 07/20/22.      Assessment & Plan:   Crystal Wong was seen today for vaginitis.  Diagnoses and all orders for this visit:  Acute vaginitis Negative wet prep with self swab. Diflucan as below based on symptoms however. Return to office for new or worsening symptoms, or if symptoms persist.  -     WET PREP FOR TRICH, YEAST, CLUE -     fluconazole (DIFLUCAN) 150 MG tablet; Take one tablet my mouth now. May repeat in 3 days if symptoms persist.  The patient indicates understanding of these issues and agrees with the plan.   Gabriel Earing, FNP

## 2022-08-01 ENCOUNTER — Encounter: Payer: Self-pay | Admitting: Nurse Practitioner

## 2022-08-01 ENCOUNTER — Telehealth: Payer: 59 | Admitting: Nurse Practitioner

## 2022-08-01 DIAGNOSIS — B370 Candidal stomatitis: Secondary | ICD-10-CM

## 2022-08-01 MED ORDER — NYSTATIN 100000 UNIT/ML MT SUSP
5.0000 mL | Freq: Four times a day (QID) | OROMUCOSAL | 0 refills | Status: DC
Start: 2022-08-01 — End: 2022-09-05

## 2022-08-01 MED ORDER — FLUCONAZOLE 150 MG PO TABS: 150.0000 mg | ORAL_TABLET | Freq: Once | ORAL | 0 refills | Status: AC

## 2022-08-01 NOTE — Patient Instructions (Signed)

## 2022-08-01 NOTE — Progress Notes (Signed)
Virtual Visit Consent   Crystal Wong, you are scheduled for a virtual visit with [name], FNP, a [facility] provider, today.     Just as with appointments in the office, your consent must be obtained to participate.  Your consent will be active for this visit and any virtual visit you may have with one of our providers in the next 365 days.     If you have a MyChart account, a copy of this consent can be sent to you electronically.  All virtual visits are billed to your insurance company just like a traditional visit in the office.    As this is a virtual visit, video technology does not allow for your provider to perform a traditional examination.  This may limit your provider's ability to fully assess your condition.  If your provider identifies any concerns that need to be evaluated in person or the need to arrange testing (such as labs, EKG, etc.), we will make arrangements to do so.     Although advances in technology are sophisticated, we cannot ensure that it will always work on either your end or our end.  If the connection with a video visit is poor, the visit may have to be switched to a telephone visit.  With either a video or telephone visit, we are not always able to ensure that we have a secure connection.     I need to obtain your verbal consent now.   Are you willing to proceed with your visit today? YES   Crystal Wong has provided verbal consent on 08/01/2022 for a virtual visit (video or telephone).   [name], FNP   Date: 08/01/2022 10:42 AM   Virtual Visit via Video Note   I, [name], connected with Crystal Wong ([id number], 1955/08/18) on 08/01/22 at  5:30 PM EDT by a video-enabled telemedicine application and verified that I am speaking with the correct person using two identifiers.  Location: Patient: Virtual Visit Location Patient: Home Provider: Virtual Visit Location Provider: Mobile   I discussed the limitations of  evaluation and management by telemedicine and the availability of in person appointments. The patient expressed understanding and agreed to proceed.    History of Present Illness: Crystal Wong is a 67 y.o. who identifies as a female who was assigned female at birth, and is being seen today for yeast infection.  HPI: Patient went on [place] 1 week ago. Got covid while on ship. Was treated on ship with breathing treatment , z pak and molnupivir. She is feeling better. But she is having a bad taste in her mouth and white film on tongue.     Review of Systems  Constitutional:  Negative for diaphoresis and weight loss.  Eyes:  Negative for blurred vision, double vision and pain.  Respiratory:  Negative for shortness of breath.   Cardiovascular:  Negative for chest pain, palpitations, orthopnea and leg swelling.  Gastrointestinal:  Negative for abdominal pain.  Skin:  Negative for rash.  Neurological:  Negative for dizziness, sensory change, loss of consciousness, weakness and headaches.  Endo/Heme/Allergies:  Negative for polydipsia. Does not bruise/bleed easily.  Psychiatric/Behavioral:  Negative for memory loss. The patient does not have insomnia.   All other systems reviewed and are negative.   Problems:  Patient Active Problem List   Diagnosis Date Noted   Diabetes mellitus without complication (HCC) 07/11/2022   Cough variant asthma 04/24/2016   BMI 33.0-33.9,adult 03/02/2016  Mixed hyperlipidemia 08/23/2012   Insomnia 08/23/2012   GERD (gastroesophageal reflux disease) 08/23/2012   Depression 08/23/2012   GAD (generalized anxiety disorder) 08/23/2012    Allergies:  Allergies  Allergen Reactions   Sulfa Antibiotics Anaphylaxis   Sulfamethoxazole-Trimethoprim Swelling   Elemental Sulfur Other (See Comments)    Other reaction(s): Angioedema (ALLERGY/intolerance) Other reaction(s): Angioedema (ALLERGY/intolerance)   Medications:  Current Outpatient Medications:     albuterol (VENTOLIN HFA) 108 (90 Base) MCG/ACT inhaler, 2 puff q6 prn, Disp: 18 g, Rfl: 1   betamethasone dipropionate 0.05 % lotion, APPLY 1 APPLICATION TO THE SCALP NIGHTLY, Disp: , Rfl:    budesonide-formoterol (SYMBICORT) 80-4.5 MCG/ACT inhaler, Take 2 puffs first thing in am and then another 2 puffs about 12 hours later., Disp: 1 each, Rfl: 12   citalopram (CELEXA) 40 MG tablet, Take 1 tablet (40 mg total) by mouth daily., Disp: 90 tablet, Rfl: 1   dexlansoprazole (DEXILANT) 60 MG capsule, Take 1 capsule (60 mg total) by mouth daily., Disp: 90 capsule, Rfl: 3   Dulaglutide (TRULICITY) 0.75 MG/0.5ML SOPN, Inject 1.5 mg into the skin once a week., Disp: 3 mL, Rfl: 2   dutasteride (AVODART) 0.5 MG capsule, , Disp: , Rfl:    famotidine (PEPCID) 20 MG tablet, Take 1 tablet (20 mg total) by mouth 2 (two) times daily., Disp: 180 tablet, Rfl: 3   fluconazole (DIFLUCAN) 150 MG tablet, Take one tablet my mouth now. May repeat in 3 days if symptoms persist., Disp: 2 tablet, Rfl: 0   fluticasone (FLONASE) 50 MCG/ACT nasal spray, Place 2 sprays into both nostrils daily., Disp: 16 g, Rfl: 6   hydrocortisone (ANUSOL-HC) 25 MG suppository, PLACE 1 SUPPOSITORY (25 MG TOTAL) RECTALLY 2 (TWO) TIMES DAILY., Disp: 12 suppository, Rfl: 0   lansoprazole (PREVACID) 30 MG capsule, Take 1 capsule (30 mg total) by mouth 2 (two) times daily before a meal., Disp: 60 capsule, Rfl: 2   linaclotide (LINZESS) 145 MCG CAPS capsule, Take 1 capsule (145 mcg total) by mouth daily before breakfast., Disp: 30 capsule, Rfl: 3   LORATADINE-D 24HR 10-240 MG 24 hr tablet, Take 1 tablet by mouth once daily, Disp: 90 tablet, Rfl: 1   LORazepam (ATIVAN) 1 MG tablet, Take 1 tablet (1 mg total) by mouth every 8 (eight) hours as needed., Disp: 30 tablet, Rfl: 5   melatonin 5 MG TABS, Take 5 mg by mouth at bedtime., Disp: , Rfl:    REPATHA PUSHTRONEX SYSTEM 420 MG/3.5ML SOCT, INJECT 420 MG SUBCUTANEOUSLY EVERY 30 DAYS, Disp: 3.5 mL, Rfl: 5    rosuvastatin (CRESTOR) 40 MG tablet, Take 1 tablet (40 mg total) by mouth daily., Disp: 90 tablet, Rfl: 1   VITAMIN D, CHOLECALCIFEROL, PO, Take 1 tablet by mouth daily. , Disp: , Rfl:   Observations/Objective: Patient is well-developed, well-nourished in no acute distress.  Resting comfortably  at home.  Head is normocephalic, atraumatic.  No labored breathing.  Speech is clear and coherent with logical content.  Patient is alert and oriented at baseline.  White film on tongue  Assessment and Plan:  Crystal Wong in today with chief complaint of No chief complaint on file.   1. Thrush Avoid spicy and fatty foods No acidic drinks RTO prn - nystatin (MYCOSTATIN) 100000 UNIT/ML suspension; Take 5 mLs (500,000 Units total) by mouth 4 (four) times daily.  Dispense: 120 mL; Refill: 0    Follow Up Instructions: I discussed the assessment and treatment plan with the patient. The patient was  provided an opportunity to ask questions and all were answered. The patient agreed with the plan and demonstrated an understanding of the instructions.  A copy of instructions were sent to the patient via MyChart.  The patient was advised to call back or seek an in-person evaluation if the symptoms worsen or if the condition fails to improve as anticipated.  Time:  I spent 6 minutes with the patient via telehealth technology discussing the above problems/concerns.    [name], FNP

## 2022-08-02 ENCOUNTER — Other Ambulatory Visit: Payer: Self-pay | Admitting: Family Medicine

## 2022-08-02 ENCOUNTER — Encounter: Payer: Self-pay | Admitting: Family Medicine

## 2022-08-02 ENCOUNTER — Telehealth: Payer: 59 | Admitting: Family Medicine

## 2022-08-02 ENCOUNTER — Other Ambulatory Visit: Payer: 59

## 2022-08-02 ENCOUNTER — Ambulatory Visit (HOSPITAL_COMMUNITY)
Admission: RE | Admit: 2022-08-02 | Discharge: 2022-08-02 | Disposition: A | Discharge status: Home / Self Care | Payer: 59 | Source: Ambulatory Visit | Attending: Family Medicine | Admitting: Family Medicine

## 2022-08-02 DIAGNOSIS — R079 Chest pain, unspecified: Secondary | ICD-10-CM | POA: Diagnosis not present

## 2022-08-02 DIAGNOSIS — R0602 Shortness of breath: Secondary | ICD-10-CM | POA: Diagnosis not present

## 2022-08-02 DIAGNOSIS — U071 COVID-19: Secondary | ICD-10-CM | POA: Diagnosis not present

## 2022-08-02 DIAGNOSIS — R071 Chest pain on breathing: Secondary | ICD-10-CM | POA: Diagnosis not present

## 2022-08-02 DIAGNOSIS — M940 Chondrocostal junction syndrome [Tietze]: Secondary | ICD-10-CM

## 2022-08-02 MED ORDER — PREDNISONE 20 MG PO TABS
20.0000 mg | ORAL_TABLET | Freq: Every day | ORAL | 0 refills | Status: AC
Start: 2022-08-02 — End: 2022-08-07

## 2022-08-02 MED ORDER — IOHEXOL 350 MG/ML SOLN
75.0000 mL | Freq: Once | INTRAVENOUS | Status: AC | PRN
  Administered 2022-08-02: 75 mL via INTRAVENOUS

## 2022-08-02 NOTE — Addendum Note (Signed)
Addended by: Gabriel Earing on: 08/02/2022 01:47 PM   Modules accepted: Orders

## 2022-08-02 NOTE — Progress Notes (Addendum)
   Virtual Visit via video Note   Due to COVID-19 pandemic this visit was conducted virtually. This visit type was conducted due to national recommendations for restrictions regarding the COVID-19 Pandemic (e.g. social distancing, sheltering in place) in an effort to limit this patient's exposure and mitigate transmission in our community. All issues noted in this document were discussed and addressed.  A physical exam was not performed with this format.  I connected with  Crystal Wong  on 08/02/22 at 1302 by video and verified that I am speaking with the correct person using two identifiers. Crystal Wong is currently located at home and no one is currently with her during the visit. The provider, Gabriel Earing, FNP is located in their office at time of visit.  I discussed the limitations, risks, security and privacy concerns of performing an evaluation and management service by video  and the availability of in person appointments. I also discussed with the patient that there may be a patient responsible charge related to this service. The patient expressed understanding and agreed to proceed.   History and Present Illness:  Crystal Wong tested positive for Covid while on a cruise. Symptoms started on 5/24. She was seen by a medical provider on the cruise and was started on paxlovid and a zpak. She reports continued cough and congestion. She has been having some mild shortness of breath and wheezing. She has been using her albuterol inhaler prn with good relief. She is concerned because she has had generalized left sided anterior chest pain extending down to her ribs with breathing that started suddenly today. Pain is chest also occurw tihe coughing, but is milder.  She denies fever.    ROS As per HPI.    Observations/Objective: Alert and oriented. Respirations unlabored. No cyanosis. Non toxic appearing. Normal mood and behavior.   Assessment and Plan: Diagnoses and all orders for this  visit:  Chest pain on breathing -     CT Angio Chest W/Cm &/Or Wo Cm; Future  COVID-19 -     CT Angio Chest W/Cm &/Or Wo Cm; Future  Shortness of breath -     CT Angio Chest W/Cm &/Or Wo Cm; Future  CT Angio ordered to r/o PE given current Covid infection as well as to assess for possible pneumonia although she has been treated with a zpak. Will notify patient of results and plan of care pending CT results.   Follow Up Instructions: Return to office for new or worsening symptoms, or if symptoms persist.     I discussed the assessment and treatment plan with the patient. The patient was provided an opportunity to ask questions and all were answered. The patient agreed with the plan and demonstrated an understanding of the instructions.   The patient was advised to call back or seek an in-person evaluation if the symptoms worsen or if the condition fails to improve as anticipated.  The above assessment and management plan was discussed with the patient. The patient verbalized understanding of and has agreed to the management plan. Patient is aware to call the clinic if symptoms persist or worsen. Patient is aware when to return to the clinic for a follow-up visit. Patient educated on when it is appropriate to go to the emergency department.   Time call ended: 1308  I provided 6 minutes of face-to-face time during this encounter.    Gabriel Earing, FNP

## 2022-08-02 NOTE — Addendum Note (Signed)
Addended by: Gabriel Earing on: 08/02/2022 02:05 PM   Modules accepted: Orders

## 2022-08-05 ENCOUNTER — Other Ambulatory Visit: Payer: Self-pay | Admitting: Nurse Practitioner

## 2022-08-07 ENCOUNTER — Other Ambulatory Visit: Payer: Self-pay | Admitting: Family

## 2022-08-07 ENCOUNTER — Encounter: Payer: Self-pay | Admitting: Gastroenterology

## 2022-08-07 ENCOUNTER — Ambulatory Visit: Payer: Medicare Other | Admitting: Gastroenterology

## 2022-08-07 VITALS — BP 122/68 | HR 80 | Ht 66.0 in

## 2022-08-07 DIAGNOSIS — K219 Gastro-esophageal reflux disease without esophagitis: Secondary | ICD-10-CM

## 2022-08-07 DIAGNOSIS — K5900 Constipation, unspecified: Secondary | ICD-10-CM | POA: Diagnosis not present

## 2022-08-07 MED ORDER — MOTEGRITY 2 MG PO TABS: 2.0000 mg | ORAL_TABLET | Freq: Every day | ORAL | 3 refills | Status: DC

## 2022-08-07 MED ORDER — TRULICITY 1.5 MG/0.5ML ~~LOC~~ SOAJ: 1.5000 mg | SUBCUTANEOUS | 2 refills | Status: DC

## 2022-08-07 NOTE — Progress Notes (Signed)
08/07/2022 Crystal Wong [id number] 04/25/1955   HISTORY OF PRESENT ILLNESS: This is a 67 year old female who was previously a patient of Dr. Orvan Falconer.  She was last seen by Dr. Orvan Falconer on 05/02/2022.  Follows here for issues related to acid reflux and chronic constipation.  In regards to her acid reflux she is on Dexilant 60 mg daily and famotidine 20 mg twice daily.  She is doing well with that regimen.  She is currently paying over $70 a month for her Dexilant, however, which is the cheapest she has been able to get it.  Had change in her insurance on June 1.  In regards to her constipation, she had previously been on MiraLAX, but that was no longer working.  Dr. Orvan Falconer had wanted to try Motegrity in hopes of also improving upper GI motility, but it was denied by insurance.  They started Linzess 145 mcg daily.  She has been on that since March.  She has not had any improvement with that.  He is still needing to use MiraLAX or smooth move tea daily in addition to the Linzess.  She had an EGD 06/09/19 that showed: - Normal esophagus. Biopsies showed reflux and were negative for Barrett's and EOE. - Mild chronic gastritis. No H pylori.  - Normal examined duodenum.  Cologuard January 2023 was negative.   Past Medical History:  Diagnosis Date   Anxiety    Asthma    Depression    GERD (gastroesophageal reflux disease)    Heart murmur    Hyperlipidemia    Insomnia    Past Surgical History:  Procedure Laterality Date   ANKLE FRACTURE SURGERY Left    Pins and screws   BREAST LUMPECTOMY Bilateral    left x 1, right x 2   CARPAL TUNNEL RELEASE Right    CARPAL TUNNEL RELEASE Left    COLONOSCOPY     KNEE ARTHROSCOPY Bilateral    PARTIAL HYSTERECTOMY      reports that she has never smoked. She has never used smokeless tobacco. She reports that she does not currently use alcohol. She reports that she does not use drugs. family history includes Colon cancer (age of onset: 75) in her  father; Colon polyps in her mother; Diabetes in her mother; Gallbladder disease in her daughter; Gallstones in her paternal grandmother; Heart disease in her mother. Allergies  Allergen Reactions   Sulfa Antibiotics Anaphylaxis   Sulfamethoxazole-Trimethoprim Swelling   Elemental Sulfur Other (See Comments)    Other reaction(s): Angioedema (ALLERGY/intolerance) Other reaction(s): Angioedema (ALLERGY/intolerance)      Outpatient Encounter Medications as of 08/07/2022  Medication Sig   albuterol (VENTOLIN HFA) 108 (90 Base) MCG/ACT inhaler 2 puff q6 prn   betamethasone dipropionate 0.05 % lotion APPLY 1 APPLICATION TO THE SCALP NIGHTLY   budesonide-formoterol (SYMBICORT) 80-4.5 MCG/ACT inhaler Take 2 puffs first thing in am and then another 2 puffs about 12 hours later.   citalopram (CELEXA) 40 MG tablet Take 1 tablet (40 mg total) by mouth daily.   dexlansoprazole (DEXILANT) 60 MG capsule Take 1 capsule (60 mg total) by mouth daily.   Dulaglutide (TRULICITY) 0.75 MG/0.5ML SOPN Inject 1.5 mg into the skin once a week.   dutasteride (AVODART) 0.5 MG capsule    famotidine (PEPCID) 20 MG tablet Take 1 tablet (20 mg total) by mouth 2 (two) times daily.   fluticasone (FLONASE) 50 MCG/ACT nasal spray Place 2 sprays into both nostrils daily.   hydrocortisone (ANUSOL-HC) 25 MG suppository  PLACE 1 SUPPOSITORY (25 MG TOTAL) RECTALLY 2 (TWO) TIMES DAILY.   lansoprazole (PREVACID) 30 MG capsule Take 1 capsule (30 mg total) by mouth 2 (two) times daily before a meal.   linaclotide (LINZESS) 145 MCG CAPS capsule Take 1 capsule (145 mcg total) by mouth daily before breakfast.   LORATADINE-D 24HR 10-240 MG 24 hr tablet Take 1 tablet by mouth once daily   LORazepam (ATIVAN) 1 MG tablet Take 1 tablet (1 mg total) by mouth every 8 (eight) hours as needed.   melatonin 5 MG TABS Take 5 mg by mouth at bedtime.   nystatin (MYCOSTATIN) 100000 UNIT/ML suspension Take 5 mLs (500,000 Units total) by mouth 4 (four)  times daily.   predniSONE (DELTASONE) 20 MG tablet Take 1 tablet (20 mg total) by mouth daily with breakfast for 5 days.   REPATHA PUSHTRONEX SYSTEM 420 MG/3.5ML SOCT INJECT 420 MG SUBCUTANEOUSLY EVERY 30 DAYS   rosuvastatin (CRESTOR) 40 MG tablet Take 1 tablet (40 mg total) by mouth daily.   VITAMIN D, CHOLECALCIFEROL, PO Take 1 tablet by mouth daily.    No facility-administered encounter medications on file as of 08/07/2022.    REVIEW OF SYSTEMS  : All other systems reviewed and negative except where noted in the History of Present Illness.   PHYSICAL EXAM: BP 122/68   Pulse 80   Ht 5\' 6"  (1.676 m)   BMI 30.34 kg/m  General: Well developed white female in no acute distress Head: Normocephalic and atraumatic Eyes:  Sclerae anicteric, conjunctiva pink. Ears: Normal auditory acuity Lungs: Clear throughout to auscultation; no W/R/R. Heart: Regular rate and rhythm; no M/R/G. Abdomen: Soft, non-distended.  BS present.  Non-tender. Musculoskeletal: Symmetrical with no gross deformities  Skin: No lesions on visible extremities Extremities: No edema  Neurological: Alert oriented x 4, grossly non-focal Psychological:  Alert and cooperative. Normal mood and affect  ASSESSMENT AND PLAN: Reflux esophagitis presenting with chest/esophageal spasm    - prior EGD with Dr. Merri Brunette 2017, no biopsies obtained at that time    - EGD 06/09/19: reflux esophagitis, no eosinophilic esophagitis or Barrett's Chronic constipation    - TSH and calcium normal 2020 Mild H pylori negative gastritis on EGD 06/09/19 Asymptomatic cholelithiasis Colon cancer screening    - Colonoscopy with Dr. Merri Brunette 2017    - Cologuard negative 2023 Family history of colon cancer (father in his 59s)   GERD: Continue Dexilant 60 mg daily and famotidine 20 mg BID. Recommend 24 pH probe, impedence testing, and esophageal manometry if symptoms recurrent despite this.  Fortunately has good symptoms control at this time.   H pylori  negative gastritis: Continue PPI and H2Blocker. Avoid all NSAIDs.    Chronic constipation:  Doing well with dietary recommendations and fluid intake, exercise/activity, etc. Miralax no longer controlling symptoms. Trial of Motegrity recommended with the hope that this may also provider some improvement in upper GI tract dysmotility.  Motegrity was denied so is on Linzess 145 mcg daily daily, which is not helping alone.  Has new insurance that started on June 1.  She would like to try for the Motegrity again.  Will send that over to her pharmacy.  In the meantime she can increase the Linzess to 2 pills daily equaling 290 mcg and see how that works in the interim.  **We will plan for follow-up in approximately 3 months.     CC:  Daphine Deutscher, Mary-Margaret, *

## 2022-08-07 NOTE — Patient Instructions (Addendum)
We have sent the following medications to your pharmacy for you to pick up at your convenience: Motegrity 2 mg daily.   May increase Linzess 145 mcg 2 capsules daily in the meantime while getting Motegrity approved.  _______________________________________________________  If your blood pressure at your visit was 140/90 or greater, please contact your primary care physician to follow up on this.  _______________________________________________________  If you are age 27 or older, your body mass index should be between 23-30. Your Body mass index is 30.34 kg/m. If this is out of the aforementioned range listed, please consider follow up with your Primary Care Provider.  If you are age 10 or younger, your body mass index should be between 19-25. Your Body mass index is 30.34 kg/m. If this is out of the aformentioned range listed, please consider follow up with your Primary Care Provider.   ________________________________________________________  The  GI providers would like to encourage you to use [facility] to communicate with providers for non-urgent requests or questions.  Due to long hold times on the telephone, sending your provider a message by [facility] may be a faster and more efficient way to get a response.  Please allow 48 business hours for a response.  Please remember that this is for non-urgent requests.  _______________________________________________________

## 2022-08-07 NOTE — Telephone Encounter (Signed)
Name from pharmacy: TRULICITY 0.75 MG/0.5 ML PEN   Pharmacy comment: Script Clarification:SIG CODE CALLS FOR THE 1.5MG  DOSE; SHOULD RX BE FOR TRULICITY 1.5MG /0.5ML PEN INTEAD?

## 2022-08-10 ENCOUNTER — Telehealth: Payer: Self-pay | Admitting: Cardiology

## 2022-08-10 ENCOUNTER — Telehealth: Payer: Self-pay | Admitting: Gastroenterology

## 2022-08-10 DIAGNOSIS — E782 Mixed hyperlipidemia: Secondary | ICD-10-CM

## 2022-08-10 DIAGNOSIS — I350 Nonrheumatic aortic (valve) stenosis: Secondary | ICD-10-CM

## 2022-08-10 MED ORDER — REPATHA PUSHTRONEX SYSTEM 420 MG/3.5ML ~~LOC~~ SOCT
420.0000 mg | SUBCUTANEOUS | 0 refills | Status: DC
Start: 2022-08-10 — End: 2022-10-25

## 2022-08-10 MED ORDER — LINACLOTIDE 290 MCG PO CAPS: 290.0000 ug | ORAL_CAPSULE | Freq: Every day | ORAL | 5 refills | Status: DC

## 2022-08-10 MED ORDER — REPATHA SURECLICK 140 MG/ML ~~LOC~~ SOAJ
1.0000 mL | SUBCUTANEOUS | 1 refills | Status: DC
Start: 2022-08-10 — End: 2022-10-25

## 2022-08-10 NOTE — Telephone Encounter (Signed)
Pt c/o medication issue:  1. Name of Medication: REPATHA PUSHTRONEX SYSTEM 420 MG/3.5ML SOCT   2. How are you currently taking this medication (dosage and times per day)?   3. Are you having a reaction (difficulty breathing--STAT)? no  4. What is your medication issue? Pharmacy states that the [facility] is not longer makes this version of medication. She will need to go to the twice a mount injectable. Please advise

## 2022-08-10 NOTE — Telephone Encounter (Signed)
Patient called stating motegrity medication is almost $300 out of pocket. States double her linzess dosage has helped. Requesting for a prescription to be sent in for the dosage that she would need for linzess. Requesting a call to discuss further. Please advise, thank you.

## 2022-08-10 NOTE — Telephone Encounter (Signed)
Linzess 290 mcg sent to pharmacy. Patient's husband informed.

## 2022-08-10 NOTE — Telephone Encounter (Signed)
Spoke with patient. Sent in rx for repatha sureclix, patient reports she knows how to use. Offered sample of pushtronex. Ready in fridge

## 2022-08-11 ENCOUNTER — Telehealth: Payer: Self-pay | Admitting: Nurse Practitioner

## 2022-08-11 ENCOUNTER — Other Ambulatory Visit: Payer: Self-pay | Admitting: Nurse Practitioner

## 2022-08-11 MED ORDER — TRULICITY 0.75 MG/0.5ML ~~LOC~~ SOAJ: 0.7500 mg | SUBCUTANEOUS | 2 refills | Status: DC

## 2022-08-11 NOTE — Telephone Encounter (Signed)
Decreased trulicity back to 0.75 weekly- let me know if has any issues Meds ordered this encounter  Medications   Dulaglutide (TRULICITY) 0.75 MG/0.5ML SOPN    Sig: Inject 0.75 mg into the skin once a week.    Dispense:  4 mL    Refill:  2    Order Specific Question:   Supervising Provider    Answer:   Nils Pyle [[id number]]   [name], FNP

## 2022-08-11 NOTE — Telephone Encounter (Signed)
Name from pharmacy: TRULICITY 0.75 MG/0.5 ML PEN        Will file in chart as: TRULICITY 0.75 MG/0.5ML SOPN    Possible duplicate: Hover to review recent actions on this medication   Sig: Inject 1.5 mg into the skin once a week.    Pharmacy comment: Alternative Requested:**PATIENT STATES SHE IS ONLY INJECTING 0.75 ONCE WEEKLY; PLEASE RESEND RX WITH SIG CODE: INJECT 0.75MG  ONCE WEEKLY; ALSO DRUG IS REQUIRING A PRIOR AUTHORIZATION.

## 2022-08-11 NOTE — Telephone Encounter (Signed)
Lmtcb.

## 2022-08-15 NOTE — Progress Notes (Signed)
Agree with assessment/plan.  Raj Collins Kerby, MD [facility] [phone]  

## 2022-08-31 ENCOUNTER — Other Ambulatory Visit: Payer: Self-pay | Admitting: Nurse Practitioner

## 2022-08-31 DIAGNOSIS — F3342 Major depressive disorder, recurrent, in full remission: Secondary | ICD-10-CM

## 2022-09-05 ENCOUNTER — Ambulatory Visit (INDEPENDENT_AMBULATORY_CARE_PROVIDER_SITE_OTHER): Payer: Medicare Other | Admitting: Nurse Practitioner

## 2022-09-05 ENCOUNTER — Encounter: Payer: Self-pay | Admitting: Nurse Practitioner

## 2022-09-05 VITALS — BP 134/76 | HR 69 | Temp 97.8°F | Resp 20 | Ht 66.0 in | Wt 189.0 lb

## 2022-09-05 DIAGNOSIS — K5901 Slow transit constipation: Secondary | ICD-10-CM | POA: Diagnosis not present

## 2022-09-05 DIAGNOSIS — Z6833 Body mass index (BMI) 33.0-33.9, adult: Secondary | ICD-10-CM

## 2022-09-05 DIAGNOSIS — F3342 Major depressive disorder, recurrent, in full remission: Secondary | ICD-10-CM

## 2022-09-05 DIAGNOSIS — E782 Mixed hyperlipidemia: Secondary | ICD-10-CM

## 2022-09-05 DIAGNOSIS — E119 Type 2 diabetes mellitus without complications: Secondary | ICD-10-CM | POA: Diagnosis not present

## 2022-09-05 DIAGNOSIS — Z7985 Long-term (current) use of injectable non-insulin antidiabetic drugs: Secondary | ICD-10-CM | POA: Diagnosis not present

## 2022-09-05 DIAGNOSIS — J45991 Cough variant asthma: Secondary | ICD-10-CM | POA: Diagnosis not present

## 2022-09-05 DIAGNOSIS — K2100 Gastro-esophageal reflux disease with esophagitis, without bleeding: Secondary | ICD-10-CM

## 2022-09-05 DIAGNOSIS — F411 Generalized anxiety disorder: Secondary | ICD-10-CM

## 2022-09-05 DIAGNOSIS — F5101 Primary insomnia: Secondary | ICD-10-CM

## 2022-09-05 LAB — BAYER DCA HB A1C WAIVED: HB A1C (BAYER DCA - WAIVED): 5.9 % — ABNORMAL HIGH (ref 4.8–5.6)

## 2022-09-05 MED ORDER — FAMOTIDINE 20 MG PO TABS: 20.0000 mg | ORAL_TABLET | Freq: Two times a day (BID) | ORAL | 1 refills | Status: DC

## 2022-09-05 MED ORDER — NYSTATIN 100000 UNIT/ML MT SUSP: 5.0000 mL | Freq: Four times a day (QID) | OROMUCOSAL | 0 refills | Status: DC

## 2022-09-05 MED ORDER — LORAZEPAM 1 MG PO TABS: 1.0000 mg | ORAL_TABLET | Freq: Three times a day (TID) | ORAL | 5 refills | Status: DC | PRN

## 2022-09-05 MED ORDER — LANSOPRAZOLE 30 MG PO CPDR
30.0000 mg | DELAYED_RELEASE_CAPSULE | Freq: Two times a day (BID) | ORAL | 2 refills | Status: DC
Start: 2022-09-05 — End: 2022-10-25

## 2022-09-05 MED ORDER — BUDESONIDE-FORMOTEROL FUMARATE 80-4.5 MCG/ACT IN AERO
INHALATION_SPRAY | RESPIRATORY_TRACT | 12 refills | Status: DC
Start: 2022-09-05 — End: 2023-03-13

## 2022-09-05 MED ORDER — CITALOPRAM HYDROBROMIDE 40 MG PO TABS
40.0000 mg | ORAL_TABLET | Freq: Every day | ORAL | 1 refills | Status: DC
Start: 2022-09-05 — End: 2023-01-23

## 2022-09-05 MED ORDER — ROSUVASTATIN CALCIUM 40 MG PO TABS: 40.0000 mg | ORAL_TABLET | Freq: Every day | ORAL | 1 refills | Status: DC

## 2022-09-05 MED ORDER — SEMAGLUTIDE(0.25 OR 0.5MG/DOS) 2 MG/3ML ~~LOC~~ SOPN
0.5000 mg | PEN_INJECTOR | SUBCUTANEOUS | 2 refills | Status: DC
Start: 2022-09-05 — End: 2023-03-13

## 2022-09-05 MED ORDER — DEXLANSOPRAZOLE 60 MG PO CPDR: 60.0000 mg | DELAYED_RELEASE_CAPSULE | Freq: Every day | ORAL | 1 refills | Status: DC

## 2022-09-05 NOTE — Patient Instructions (Signed)
Cough, Adult Coughing is a reflex that clears your throat and airways (respiratory system). It helps heal and protect your lungs. It is normal to cough from time to time. A cough that happens with other symptoms or that lasts a long time may be a sign of a condition that needs treatment. A short-term (acute) cough may only last 2-3 weeks. A long-term (chronic) cough may last 8 or more weeks. Coughing is often caused by: Diseases, such as: An infection of the respiratory system. Asthma or other heart or lung diseases. Gastroesophageal reflux. This is when acid comes back up from the stomach. Breathing in things that irritate your lungs. Allergies. Postnasal drip. This is when mucus runs down the back of your throat. Smoking. Some medicines. Follow these instructions at home: Medicines Take over-the-counter and prescription medicines only as told by your health care provider. Talk with your provider before you take cough medicine (cough suppressants). Eating and drinking Do not drink alcohol. Avoid caffeine. Drink enough fluid to keep your pee (urine) pale yellow. Lifestyle Avoid cigarette smoke. Do not use any products that contain nicotine or tobacco. These products include cigarettes, chewing tobacco, and vaping devices, such as e-cigarettes. If you need help quitting, ask your provider. Avoid things that make you cough. These may include perfumes, candles, cleaning products, or campfire smoke. General instructions  Watch for any changes to your cough. Tell your provider about them. Always cover your mouth when you cough. If the air is dry in your bedroom or home, use a cool mist vaporizer or humidifier. If your cough is worse at night, try to sleep in a semi-upright position. Rest as needed. Contact a health care provider if: You have new symptoms, or your symptoms get worse. You cough up pus. You have a fever that does not go away or a cough that does not get better after 2-3  weeks. You cannot control your cough with medicine, and you are losing sleep. You have pain that gets worse or is not helped with medicine. You lose weight for no clear reason. You have night sweats. Get help right away if: You cough up blood. You have trouble breathing. Your heart is beating very fast. These symptoms may be an emergency. Get help right away. Call 911. Do not wait to see if the symptoms will go away. Do not drive yourself to the hospital. This information is not intended to replace advice given to you by your health care provider. Make sure you discuss any questions you have with your health care provider. Document Revised: 10/21/2021 Document Reviewed: 10/21/2021 Elsevier Patient Education  2024 Elsevier Inc.  

## 2022-09-05 NOTE — Progress Notes (Signed)
Subjective:    Patient ID: Crystal Wong, female    DOB: 11-30-1955, 67 y.o.   MRN: [id number]   Chief Complaint: Medical Management of Chronic Issues    HPI:  Crystal Wong is a 67 y.o. who identifies as a female who was assigned female at birth.   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 Mixed hyperlipidemia Does try to watch diet. Does no dedicated exercise Lab Results  Component Value Date   CHOL 179 03/07/2022   HDL 75 03/07/2022   LDLCALC 84 03/07/2022   TRIG 114 03/07/2022   CHOLHDL 2.4 03/07/2022     2. Diabetes mellitus without complication (HCC) Fasting blood sugars are running around 100-130. Lab Results  Component Value Date   HGBA1C 6.5 (H) 03/07/2022     3. Long-term current use of injectable noninsulin antidiabetic medication Insurance denied trulicity and ozempic.  4. Gastroesophageal reflux disease with esophagitis without hemorrhage Takes prevacid, dexilant  and pepcid daily. Will still have occasional symptoms  5. Cough variant asthma Chronic. Is on symbicort daily. Denies SOB now. Had covid several months ago and had lots of SOB. Has resolved.  6. Slow transit constipation Is on linzess which has really helped  7. Recurrent major depressive disorder, in full remission (HCC) Is on celexa daily and is doing well.    09/05/2022   11:23 AM 07/11/2022    9:20 AM 03/28/2022   12:25 PM 02/16/2022    9:39 AM 10/03/2021    9:33 AM  Depression screen PHQ 2/9  Decreased Interest 0 0 0 0 0  Down, Depressed, Hopeless 0 0 0 0 1  PHQ - 2 Score 0 0 0 0 1  Altered sleeping 0 0 0 0 0  Tired, decreased energy 0 0 0 0 1  Change in appetite 0 0 0 0 0  Feeling bad or failure about yourself  0 0 0 0 0  Trouble concentrating 0 0 0 0 0  Moving slowly or fidgety/restless 0 0 0 0 0  Suicidal thoughts 0 0 0 0 0  PHQ-9 Score 0 0 0 0 2  Difficult doing  work/chores Not difficult at all Not difficult at all Not difficult at all Not difficult at all Not difficult at all     8. GAD (generalized anxiety disorder) Is on ativan daily    09/05/2022   11:23 AM 07/11/2022    9:21 AM 02/16/2022    9:39 AM 09/20/2021    9:27 AM  GAD 7 : Generalized Anxiety Score  Nervous, Anxious, on Edge 0 0 0 0  Control/stop worrying 0 0 0 0  Worry too much - different things 0 0 0 0  Trouble relaxing 0 0 0 0  Restless 0 0 0 0  Easily annoyed or irritable 0 2 0 0  Afraid - awful might happen 0 0 0 0  Total GAD 7 Score 0 2 0 0  Anxiety Difficulty Not difficult at all Not difficult at all Not difficult at all Not difficult at all      9. Primary insomnia Sleeps well if she waits and takes her xanax at night  10. BMI 33.0-33.9,adult No recent weight changes Wt Readings from Last 3 Encounters:  09/05/22 189 lb (85.7 kg)  07/20/22 188 lb (85.3 kg)  07/11/22 189 lb (85.7 kg)   BMI Readings from Last 3 Encounters:  09/05/22 30.51 kg/m  08/07/22 30.34 kg/m  07/20/22 30.34 kg/m     New complaints: None today  Allergies  Allergen Reactions   Sulfa Antibiotics Anaphylaxis   Sulfamethoxazole-Trimethoprim Swelling   Elemental Sulfur Other (See Comments)    Other reaction(s): Angioedema (ALLERGY/intolerance) Other reaction(s): Angioedema (ALLERGY/intolerance)   Outpatient Encounter Medications as of 09/05/2022  Medication Sig   albuterol (VENTOLIN HFA) 108 (90 Base) MCG/ACT inhaler 2 puff q6 prn   budesonide-formoterol (SYMBICORT) 80-4.5 MCG/ACT inhaler Take 2 puffs first thing in am and then another 2 puffs about 12 hours later.   citalopram (CELEXA) 40 MG tablet TAKE 1 TABLET BY MOUTH EVERY DAY   dexlansoprazole (DEXILANT) 60 MG capsule Take 1 capsule (60 mg total) by mouth daily.   Evolocumab with Infusor (REPATHA PUSHTRONEX SYSTEM) 420 MG/3.5ML SOCT Inject 420 mg into the skin every 30 (thirty) days.   [DISCONTINUED] Dulaglutide (TRULICITY)  0.75 MG/0.5ML SOPN Inject 0.75 mg into the skin once a week.   [DISCONTINUED] dutasteride (AVODART) 0.5 MG capsule    Evolocumab (REPATHA SURECLICK) 140 MG/ML SOAJ Inject 140 mg into the skin every 14 (fourteen) days.   famotidine (PEPCID) 20 MG tablet Take 1 tablet (20 mg total) by mouth 2 (two) times daily.   fluticasone (FLONASE) 50 MCG/ACT nasal spray Place 2 sprays into both nostrils daily.   hydrocortisone (ANUSOL-HC) 25 MG suppository PLACE 1 SUPPOSITORY (25 MG TOTAL) RECTALLY 2 (TWO) TIMES DAILY.   lansoprazole (PREVACID) 30 MG capsule Take 1 capsule (30 mg total) by mouth 2 (two) times daily before a meal.   linaclotide (LINZESS) 290 MCG CAPS capsule Take 1 capsule (290 mcg total) by mouth daily before breakfast.   LORATADINE-D 24HR 10-240 MG 24 hr tablet Take 1 tablet by mouth once daily   LORazepam (ATIVAN) 1 MG tablet Take 1 tablet (1 mg total) by mouth every 8 (eight) hours as needed.   melatonin 5 MG TABS Take 5 mg by mouth at bedtime.   rosuvastatin (CRESTOR) 40 MG tablet Take 1 tablet (40 mg total) by mouth daily.   VITAMIN D, CHOLECALCIFEROL, PO Take 1 tablet by mouth daily.    [DISCONTINUED] betamethasone dipropionate 0.05 % lotion APPLY 1 APPLICATION TO THE SCALP NIGHTLY   [DISCONTINUED] nystatin (MYCOSTATIN) 100000 UNIT/ML suspension Take 5 mLs (500,000 Units total) by mouth 4 (four) times daily.   No facility-administered encounter medications on file as of 09/05/2022.    Past Surgical History:  Procedure Laterality Date   ANKLE FRACTURE SURGERY Left    Pins and screws   BREAST LUMPECTOMY Bilateral    left x 1, right x 2   CARPAL TUNNEL RELEASE Right    CARPAL TUNNEL RELEASE Left    COLONOSCOPY     KNEE ARTHROSCOPY Bilateral    PARTIAL HYSTERECTOMY      Family History  Problem Relation Age of Onset   Heart disease Mother    Diabetes Mother    Colon polyps Mother    Colon cancer Father 67   Gallbladder disease Daughter    Gallstones Paternal Grandmother     Esophageal cancer Neg Hx    Stomach cancer Neg Hx    Rectal cancer Neg Hx    Breast cancer Neg Hx       Controlled substance contract: 03/08/22     Review of Systems  Constitutional:  Negative for diaphoresis.  Eyes:  Negative for pain.  Respiratory:  Negative for shortness of breath.   Cardiovascular:  Negative for chest pain, palpitations  and leg swelling.  Gastrointestinal:  Negative for abdominal pain.  Endocrine: Negative for polydipsia.  Skin:  Negative for rash.  Neurological:  Negative for dizziness, weakness and headaches.  Hematological:  Does not bruise/bleed easily.  All other systems reviewed and are negative.      Objective:   Physical Exam Vitals and nursing note reviewed.  Constitutional:      General: She is not in acute distress.    Appearance: Normal appearance. She is well-developed.  HENT:     Head: Normocephalic.     Right Ear: Tympanic membrane normal.     Left Ear: Tympanic membrane normal.     Nose: Nose normal.     Mouth/Throat:     Mouth: Mucous membranes are moist.     Comments: White film on tongue Eyes:     Pupils: Pupils are equal, round, and reactive to light.  Neck:     Vascular: No carotid bruit or JVD.  Cardiovascular:     Rate and Rhythm: Normal rate and regular rhythm.     Heart sounds: Normal heart sounds.  Pulmonary:     Effort: Pulmonary effort is normal. No respiratory distress.     Breath sounds: Normal breath sounds. No wheezing or rales.  Chest:     Chest wall: No tenderness.  Abdominal:     General: Bowel sounds are normal. There is no distension or abdominal bruit.     Palpations: Abdomen is soft. There is no hepatomegaly, splenomegaly, mass or pulsatile mass.     Tenderness: There is no abdominal tenderness.  Musculoskeletal:        General: Normal range of motion.     Cervical back: Normal range of motion and neck supple.  Lymphadenopathy:     Cervical: No cervical adenopathy.  Skin:    General: Skin is warm  and dry.  Neurological:     Mental Status: She is alert and oriented to person, place, and time.     Deep Tendon Reflexes: Reflexes are normal and symmetric.  Psychiatric:        Behavior: Behavior normal.        Thought Content: Thought content normal.        Judgment: Judgment normal.    BP 134/76   Pulse 69   Temp 97.8 F (36.6 C) (Temporal)   Resp 20   Ht 5\' 6"  (1.676 m)   Wt 189 lb (85.7 kg)   SpO2 99%   BMI 30.51 kg/m   HGBA1c 5.9%      Assessment & Plan:   Crystal Wong comes in today with chief complaint of Medical Management of Chronic Issues   Diagnosis and orders addressed:  1. Mixed hyperlipidemia Low fat diet - Lipid panel - rosuvastatin (CRESTOR) 40 MG tablet; Take 1 tablet (40 mg total) by mouth daily.  Dispense: 90 tablet; Refill: 1  2. Diabetes mellitus without complication (HCC) Continue to watch carbs in diet Will attempt to get approval for ozempic since insurance has changed - Bayer DCA Hb A1c Waived - CBC with Differential/Platelet - CMP14+EGFR - Microalbumin / creatinine urine ratio - Semaglutide,0.25 or 0.5MG /DOS, 2 MG/3ML SOPN; Inject 0.5 mg into the skin once a week.  Dispense: 3 mL; Refill: 2  3. Long-term current use of injectable noninsulin antidiabetic medication  4. Gastroesophageal reflux disease with esophagitis without hemorrhage Avoid spicy foods Do not eat 2 hours prior to bedtime - famotidine (PEPCID) 20 MG tablet; Take 1 tablet (20 mg total) by  mouth 2 (two) times daily.  Dispense: 180 tablet; Refill: 1 - lansoprazole (PREVACID) 30 MG capsule; Take 1 capsule (30 mg total) by mouth 2 (two) times daily before a meal.  Dispense: 60 capsule; Refill: 2 - dexlansoprazole (DEXILANT) 60 MG capsule; Take 1 capsule (60 mg total) by mouth daily.  Dispense: 90 capsule; Refill: 1  5. Cough variant asthma - budesonide-formoterol (SYMBICORT) 80-4.5 MCG/ACT inhaler; Take 2 puffs first thing in am and then another 2 puffs about 12 hours  later.  Dispense: 1 each; Refill: 12  6. Slow transit constipation Continue linzess as prescribed  7. Recurrent major depressive disorder, in full remission (HCC) Stress management - citalopram (CELEXA) 40 MG tablet; Take 1 tablet (40 mg total) by mouth daily.  Dispense: 90 tablet; Refill: 1  8. GAD (generalized anxiety disorder) - LORazepam (ATIVAN) 1 MG tablet; Take 1 tablet (1 mg total) by mouth every 8 (eight) hours as needed.  Dispense: 30 tablet; Refill: 5  9. Primary insomnia Bedtime routine  10. BMI 33.0-33.9,adult Discussed diet and exercise for person with BMI >25 Will recheck weight in 3-6 months    Labs pending Health Maintenance reviewed Diet and exercise encouraged  Follow up plan: 6 month   [name], FNP

## 2022-09-06 ENCOUNTER — Telehealth: Payer: Self-pay | Admitting: Nurse Practitioner

## 2022-09-06 ENCOUNTER — Telehealth: Payer: Self-pay

## 2022-09-06 DIAGNOSIS — K2100 Gastro-esophageal reflux disease with esophagitis, without bleeding: Secondary | ICD-10-CM

## 2022-09-06 LAB — CMP14+EGFR
ALT: 19 IU/L (ref 0–32)
AST: 21 IU/L (ref 0–40)
Albumin: 4.5 g/dL (ref 3.9–4.9)
Alkaline Phosphatase: 82 IU/L (ref 44–121)
BUN/Creatinine Ratio: 16 (ref 12–28)
BUN: 14 mg/dL (ref 8–27)
Bilirubin Total: 0.4 mg/dL (ref 0.0–1.2)
CO2: 24 mmol/L (ref 20–29)
Calcium: 10 mg/dL (ref 8.7–10.3)
Chloride: 105 mmol/L (ref 96–106)
Creatinine, Ser: 0.85 mg/dL (ref 0.57–1.00)
Globulin, Total: 2.1 g/dL (ref 1.5–4.5)
Glucose: 116 mg/dL — ABNORMAL HIGH (ref 70–99)
Potassium: 4.4 mmol/L (ref 3.5–5.2)
Sodium: 140 mmol/L (ref 134–144)
Total Protein: 6.6 g/dL (ref 6.0–8.5)
eGFR: 76 mL/min/{1.73_m2} (ref >59)

## 2022-09-06 LAB — CBC WITH DIFFERENTIAL/PLATELET
Basophils Absolute: 0 10*3/uL (ref 0.0–0.2)
Basos: 1 %
EOS (ABSOLUTE): 0.1 10*3/uL (ref 0.0–0.4)
Eos: 3 %
Hematocrit: 38.9 % (ref 34.0–46.6)
Hemoglobin: 12.9 g/dL (ref 11.1–15.9)
Immature Grans (Abs): 0 10*3/uL (ref 0.0–0.1)
Immature Granulocytes: 0 %
Lymphocytes Absolute: 1.5 10*3/uL (ref 0.7–3.1)
Lymphs: 31 %
MCH: 29.6 pg (ref 26.6–33.0)
MCHC: 33.2 g/dL (ref 31.5–35.7)
MCV: 89 fL (ref 79–97)
Monocytes Absolute: 0.3 10*3/uL (ref 0.1–0.9)
Monocytes: 7 %
Neutrophils Absolute: 2.9 10*3/uL (ref 1.4–7.0)
Neutrophils: 58 %
Platelets: 230 10*3/uL (ref 150–450)
RBC: 4.36 x10E6/uL (ref 3.77–5.28)
RDW: 13 % (ref 11.7–15.4)
WBC: 5 10*3/uL (ref 3.4–10.8)

## 2022-09-06 LAB — LIPID PANEL
Chol/HDL Ratio: 2.4 ratio (ref 0.0–4.4)
Cholesterol, Total: 139 mg/dL (ref 100–199)
HDL: 57 mg/dL (ref >39)
LDL Chol Calc (NIH): 64 mg/dL (ref 0–99)
Triglycerides: 100 mg/dL (ref 0–149)
VLDL Cholesterol Cal: 18 mg/dL (ref 5–40)

## 2022-09-06 MED ORDER — DEXLANSOPRAZOLE 60 MG PO CPDR
60.0000 mg | DELAYED_RELEASE_CAPSULE | Freq: Every day | ORAL | 1 refills | Status: DC
Start: 2022-09-06 — End: 2022-09-08

## 2022-09-06 NOTE — Telephone Encounter (Signed)
Pharmacy Patient Advocate Encounter   Received notification from [facility] that prior authorization for Ozempic (0.25 or 0.5 MG/DOSE) 2MG /3ML pen-injectors is required/requested.   PA submitted to [facility] via CoverMyMeds Key or (Medicaid) confirmation # [id number] Status is pending

## 2022-09-06 NOTE — Telephone Encounter (Signed)
Crystal Wong (Key: [facility]) Rx #: [id number] Ozempic (0.25 or 0.5 MG/DOSE) 2MG /3ML pen-injectors Form [employer] Medicare Part D Electronic Prior Authorization Form (2017 NCPDP) Created 1 day ago Sent to Plan 3 hours ago Plan Response 3 hours ago Submit Clinical Questions 3 hours ago Determination Favorable 3 hours ago Message from Plan Request Reference Number: [id number]. OZEMPIC INJ 2MG /3ML is approved through 03/06/2023. Your patient may now fill this prescription and it will be covered.. Authorization Expiration Date: March 06, 2023.  Pharmacy aware

## 2022-09-06 NOTE — Telephone Encounter (Signed)
Patient needs dexlansoprazole (DEXILANT) 60 MG capsule sent to [facility] [phone] - [place], [place] - [address] AT [facility] [address] because it [facility]

## 2022-09-06 NOTE — Telephone Encounter (Signed)
Sent to corrected pharmacy  

## 2022-09-08 ENCOUNTER — Other Ambulatory Visit: Payer: Self-pay

## 2022-09-08 DIAGNOSIS — K2100 Gastro-esophageal reflux disease with esophagitis, without bleeding: Secondary | ICD-10-CM

## 2022-09-08 MED ORDER — DEXLANSOPRAZOLE 60 MG PO CPDR
60.0000 mg | DELAYED_RELEASE_CAPSULE | Freq: Every day | ORAL | 1 refills | Status: AC
Start: 2022-09-08 — End: ?

## 2022-09-18 ENCOUNTER — Encounter: Payer: Self-pay | Admitting: Cardiology

## 2022-09-27 ENCOUNTER — Telehealth: Payer: Self-pay | Admitting: Nurse Practitioner

## 2022-09-27 DIAGNOSIS — J4521 Mild intermittent asthma with (acute) exacerbation: Secondary | ICD-10-CM

## 2022-09-27 MED ORDER — ALBUTEROL SULFATE HFA 108 (90 BASE) MCG/ACT IN AERS
INHALATION_SPRAY | RESPIRATORY_TRACT | 2 refills | Status: AC
Start: 2022-09-27 — End: ?

## 2022-09-27 NOTE — Telephone Encounter (Signed)
  Prescription Request  09/27/2022   What is the name of the medication or equipment? ALBUTEROL (VENTOLIN HFA) 108 (90 Base)  Have you contacted your pharmacy to request a refill? YES  [facility] [place]   Pts last check up was with MMM on 09/05/2022. Rx was discussed at the visit but no refills have been sent in.

## 2022-09-27 NOTE — Telephone Encounter (Signed)
Refill sent to pharmacy, patient aware ?

## 2022-10-12 ENCOUNTER — Telehealth: Payer: Self-pay | Admitting: Pharmacy Technician

## 2022-10-12 ENCOUNTER — Other Ambulatory Visit (HOSPITAL_COMMUNITY): Payer: Self-pay

## 2022-10-12 NOTE — Telephone Encounter (Signed)
Pharmacy Patient Advocate Encounter   Received notification from CoverMyMeds that prior authorization for Repatha SureClick 140MG /ML auto-injectors is required/requested.   Insurance verification completed.   The patient is insured through [facility] .   Per test claim: PA required; PA started via CoverMyMeds. KEY [id number] . Waiting for clinical questions to populate.

## 2022-10-16 ENCOUNTER — Other Ambulatory Visit (HOSPITAL_COMMUNITY): Payer: Self-pay

## 2022-10-16 NOTE — Telephone Encounter (Signed)
Pharmacy Patient Advocate Encounter  Received notification from [facility] that Prior Authorization for Repatha SureClick 140MG /ML auto-injectors has been APPROVED from 10/12/2022 to 04/14/2023. Ran test claim, Copay is $359.71. This test claim was processed through [facility]- copay amounts may vary at other pharmacies due to pharmacy/plan contracts, or as the patient moves through the different stages of their insurance plan.   PA #/Case ID/Reference #: [id number]

## 2022-10-25 ENCOUNTER — Ambulatory Visit: Payer: Medicare Other | Admitting: Gastroenterology

## 2022-10-25 ENCOUNTER — Encounter: Payer: Self-pay | Admitting: Gastroenterology

## 2022-10-25 VITALS — BP 122/70 | HR 76 | Ht 66.0 in | Wt 182.0 lb

## 2022-10-25 DIAGNOSIS — K219 Gastro-esophageal reflux disease without esophagitis: Secondary | ICD-10-CM

## 2022-10-25 DIAGNOSIS — R053 Chronic cough: Secondary | ICD-10-CM | POA: Insufficient documentation

## 2022-10-25 DIAGNOSIS — K5900 Constipation, unspecified: Secondary | ICD-10-CM

## 2022-10-25 NOTE — Progress Notes (Signed)
If any Abdo pain, please proceed with CT Abdo/pelvis with contrast  Agree with assessment/plan.  Edman Circle, MD [facility] [phone]

## 2022-10-25 NOTE — Patient Instructions (Signed)
You have been scheduled for an esophageal manometry and 24 hour PH Probe test at [facility] on Wednesday 02/21/23 at 12:30 pm. Please arrive 30 minutes prior to your procedure for registration. You will need to go to outpatient registration (1st floor of the hospital) first. Make certain to bring your insurance cards as well as a complete list of medications.  Please remember the following:  1) Do not take any muscle relaxants, xanax (alprazolam) or ativan for 1 day prior to your test as well as the day of the test.  2) Nothing to eat or drink after 12:00 midnight on the night before your test.  3) Hold all diabetic medications/insulin the morning of the test. You may eat and take your medications after the test.  4) For 7 days prior to your test, do not take: Reglan, Tagamet, Zantac, Axid or Pepcid.  5) You MAY use an antacid such as Rolaids or Tums up to 12 hours prior to your test.  It will take at least 2 weeks to receive the results of this test from your physician.  ------------------------------------------ ABOUT ESOPHAGEAL MANOMETRY Esophageal manometry (muh-NOM-uh-tree) is a test that gauges how well your esophagus works. Your esophagus is the long, muscular tube that connects your throat to your stomach. Esophageal manometry measures the rhythmic muscle contractions (peristalsis) that occur in your esophagus when you swallow. Esophageal manometry also measures the coordination and force exerted by the muscles of your esophagus.  During esophageal manometry, a thin, flexible tube (catheter) that contains sensors is passed through your nose, down your esophagus and into your stomach. Esophageal manometry can be helpful in diagnosing some mostly uncommon disorders that affect your esophagus.  Why it's done Esophageal manometry is used to evaluate the movement (motility) of food through the esophagus and into the stomach. The test measures how well the circular bands of muscle  (sphincters) at the top and bottom of your esophagus open and close, as well as the pressure, strength and pattern of the wave of esophageal muscle contractions that moves food along.  What you can expect Esophageal manometry is an outpatient procedure done without sedation. Most people tolerate it well. You may be asked to change into a hospital gown before the test starts.  During esophageal manometry  While you are sitting up, a member of your health care team sprays your throat with a numbing medication or puts numbing gel in your nose or both.  A catheter is guided through your nose into your esophagus. The catheter may be sheathed in a water-filled sleeve. It doesn't interfere with your breathing. However, your eyes may water, and you may gag. You may have a slight nosebleed from irritation.  After the catheter is in place, you may be asked to lie on your back on an exam table, or you may be asked to remain seated.  You then swallow small sips of water. As you do, a computer connected to the catheter records the pressure, strength and pattern of your esophageal muscle contractions.  During the test, you'll be asked to breathe slowly and smoothly, remain as still as possible, and swallow only when you're asked to do so.  A member of your health care team may move the catheter down into your stomach while the catheter continues its measurements.  The catheter then is slowly withdrawn. The test usually lasts 20 to 30 minutes.  After esophageal manometry  When your esophageal manometry is complete, you may return to your normal activities  This test typically takes 30-45 minutes to complete. ________________________________________________________________________________  ABOUT 24 HOUR PH PROBE An esophageal pH test measures and records the pH in your esophagus to determine if you have gastroesophageal reflux disease (GERD). The test can also be done to determine the effectiveness of medications  or surgical treatment for GERD. What is esophageal reflux? Esophageal reflux is a condition in which stomach acid refluxes or moves back into the esophagus (the "food pipe" leading from the mouth to the stomach). How does the esophageal pH test work? A thin, small tube with an acid sensing device on the tip is gently passed through your nose, down the esophagus ("food tube"), and positioned about 2 inches above the lower esophageal sphincter. The tube is secured to the side of your face with clear tape. The end of the tube exiting from your nose is attached to a portable recorder that is worn on your belt or over your shoulder. The recorder has several buttons on it that you will press to mark certain events. A nurse will review the monitoring instructions with you. Once the test has begun, what do I need to know and do? Activity: Follow your usual daily routine. Do not reduce or change your activities during the monitoring period. Doing so can make the monitoring results less useful.  Note: do not take a tub bath or shower; the equipment can't get wet.  Eating: Eat your regular meals at the usual times. If you do not eat during the monitoring period, your stomach will not produce acid as usual, and the test results will not be accurate. Eat at least 2 meals a day. Eat foods that tend to increase your symptoms (without making yourself miserable). Avoid snacking. Do not suck on hard candy or lozenges and do not chew gum during the monitoring period.  Lying down: Remain upright throughout the day. Do not lie down until you go to bed (unless napping or lying down during the day is part of your daily routine).  Medications: Continue to follow your doctor's advice regarding medications to avoid during the monitoring period.  Recording symptoms: Press the appropriate button on your recorder when symptoms occur (as discussed with the nurse).  Recording events: Record the time you start and stop eating and  drinking (anything other than plain water). Record the time you lie down (even if just resting) and when you get back up. The nurse will explain this.  Unusual symptoms or side effects. If you think you may be experiencing any unusual symptoms or side effects, call your doctor.  You will return the next day to have the tube removed. The information on the recorder will be downloaded to a computer and the results will be analyzed.  After completion of the study Resume your normal diet and medications. Lozenges or hard candy may help ease any sore throat caused by the tube.

## 2022-10-25 NOTE — Progress Notes (Signed)
10/25/2022 Crystal Wong [id number] [birth year]   HISTORY OF PRESENT ILLNESS: This is a 67 year old female who was previously patient Dr. Orvan Falconer, was established with Dr. Chales Abrahams when I saw her last in June 2024.  She follows here with for issues with her acid reflux and chronic constipation.  In regards to her acid reflux she is on Dexilant 60 mg daily and famotidine 20 mg twice daily.  She was doing well with that regimen when I saw her back in June.  She says that she still is doing well in regards to the reflux itself, does not notice any of that coming up, but has started with a cough again and is unsure if that is coming from reflux issues.  In regards to her constipation she is on Linzess 290 mcg daily and is using a dose of MiraLAX daily.  Motegrity was over $300 so she was not able to get and use that.  She says that she is having a decent bowel movement about every 3 days or so.  She admits that she does feel better with all of that than she had previously.  She had an EGD 06/09/19 that showed: - Normal esophagus. Biopsies showed reflux and were negative for Barrett's and EOE. - Mild chronic gastritis. No H pylori.  - Normal examined duodenum.   Cologuard January 2023 was negative.  Past Medical History:  Diagnosis Date   Anxiety    Asthma    Depression    GERD (gastroesophageal reflux disease)    Heart murmur    Hyperlipidemia    Insomnia    Past Surgical History:  Procedure Laterality Date   ANKLE FRACTURE SURGERY Left    Pins and screws   BREAST LUMPECTOMY Bilateral    left x 1, right x 2   CARPAL TUNNEL RELEASE Right    CARPAL TUNNEL RELEASE Left    COLONOSCOPY     KNEE ARTHROSCOPY Bilateral    PARTIAL HYSTERECTOMY      reports that she has never smoked. She has never used smokeless tobacco. She reports that she does not currently use alcohol. She reports that she does not use drugs. family history includes Colon cancer (age of onset: 5) in her father; Colon  polyps in her mother; Diabetes in her mother; Gallbladder disease in her daughter; Gallstones in her paternal grandmother; Heart disease in her mother. Allergies  Allergen Reactions   Sulfa Antibiotics Anaphylaxis   Sulfamethoxazole-Trimethoprim Swelling   Elemental Sulfur Other (See Comments)    Other reaction(s): Angioedema (ALLERGY/intolerance) Other reaction(s): Angioedema (ALLERGY/intolerance)      Outpatient Encounter Medications as of 10/25/2022  Medication Sig   albuterol (VENTOLIN HFA) 108 (90 Base) MCG/ACT inhaler 2 puff q6 prn   budesonide-formoterol (SYMBICORT) 80-4.5 MCG/ACT inhaler Take 2 puffs first thing in am and then another 2 puffs about 12 hours later.   citalopram (CELEXA) 40 MG tablet Take 1 tablet (40 mg total) by mouth daily.   dexlansoprazole (DEXILANT) 60 MG capsule Take 1 capsule (60 mg total) by mouth daily.   doxycycline (VIBRAMYCIN) 100 MG capsule Take 100 mg by mouth daily.   dutasteride (AVODART) 0.5 MG capsule Take 0.5 mg by mouth daily.   famotidine (PEPCID) 20 MG tablet Take 1 tablet (20 mg total) by mouth 2 (two) times daily.   fluticasone (FLONASE) 50 MCG/ACT nasal spray Place 2 sprays into both nostrils daily.   hydrocortisone (ANUSOL-HC) 25 MG suppository PLACE 1 SUPPOSITORY (25 MG TOTAL) RECTALLY 2 (  TWO) TIMES DAILY.   linaclotide (LINZESS) 290 MCG CAPS capsule Take 1 capsule (290 mcg total) by mouth daily before breakfast.   LORATADINE-D 24HR 10-240 MG 24 hr tablet Take 1 tablet by mouth once daily   LORazepam (ATIVAN) 1 MG tablet Take 1 tablet (1 mg total) by mouth every 8 (eight) hours as needed.   melatonin 5 MG TABS Take 5 mg by mouth at bedtime.   rosuvastatin (CRESTOR) 40 MG tablet Take 1 tablet (40 mg total) by mouth daily.   Semaglutide,0.25 or 0.5MG /DOS, 2 MG/3ML SOPN Inject 0.5 mg into the skin once a week.   VITAMIN D, CHOLECALCIFEROL, PO Take 1 tablet by mouth daily.    [DISCONTINUED] Evolocumab (REPATHA SURECLICK) 140 MG/ML SOAJ Inject  140 mg into the skin every 14 (fourteen) days.   [DISCONTINUED] Evolocumab with Infusor (REPATHA PUSHTRONEX SYSTEM) 420 MG/3.5ML SOCT Inject 420 mg into the skin every 30 (thirty) days.   [DISCONTINUED] lansoprazole (PREVACID) 30 MG capsule Take 1 capsule (30 mg total) by mouth 2 (two) times daily before a meal.   [DISCONTINUED] nystatin (MYCOSTATIN) 100000 UNIT/ML suspension Take 5 mLs (500,000 Units total) by mouth 4 (four) times daily.   No facility-administered encounter medications on file as of 10/25/2022.     REVIEW OF SYSTEMS  : All other systems reviewed and negative except where noted in the History of Present Illness.   PHYSICAL EXAM: BP 122/70   Pulse 76   Ht 5\' 6"  (1.676 m)   Wt 182 lb (82.6 kg)   BMI 29.38 kg/m  General: Well developed white female in no acute distress Head: Normocephalic and atraumatic Eyes:  Sclerae anicteric, conjunctiva pink. Ears: Normal auditory acuity Lungs: Clear throughout to auscultation; no W/R/R. Heart: Regular rate and rhythm; no M/R/G. Abdomen: Soft, non-distended.  BS present.  Non-tender. Musculoskeletal: Symmetrical with no gross deformities  Skin: No lesions on visible extremities Extremities: No edema  Neurological: Alert oriented x 4, grossly non-focal Psychological:  Alert and cooperative. Normal mood and affect  ASSESSMENT AND PLAN: Reflux esophagitis presenting with chest/esophageal spasm    - prior EGD with Dr. Merri Brunette 2017, no biopsies obtained at that time    - EGD 06/09/19: reflux esophagitis, no eosinophilic esophagitis or Barrett's Chronic constipation    - TSH and calcium normal 2020 Mild H pylori negative gastritis on EGD 06/09/19 Asymptomatic cholelithiasis Colon cancer screening    - Colonoscopy with Dr. Merri Brunette 2017    - Cologuard negative 2023 Family history of colon cancer (father in his 46s)   GERD: Is on Dexilant 60 mg daily and famotidine 20 mg BID. She does not feel any overt reflux symptoms but has been  experiencing a cough again.  Will continue her current regimen but it was recommended to do a 24 pH probe, impedence testing, and esophageal manometry if symptoms recurrent despite this regimen.  Will plan for that.  I think would probably be best to perform that study while on her PPI and reflux regimen to prove that the medication is helping and see if she is still having active reflux despite the medications that would be contributing to her cough.   H pylori negative gastritis: Continue PPI and H2Blocker. Avoid all NSAIDs.    Chronic constipation: Motegrity was too expensive.  Is doing Linzess 290 mcg daily and MiraLAX 1 dose daily with recent bowel movement every 3 days or so.  We discussed switching the Linzess to something else.  She looked up on her insurance but  looks like [facility] would be the next best option followed by [facility].  She would like to leave this the same for now and maybe increase her MiraLAX to twice daily first if needed.   CC:  Daphine Deutscher, Mary-Margaret, *

## 2022-10-31 ENCOUNTER — Telehealth: Payer: Self-pay | Admitting: *Deleted

## 2022-10-31 NOTE — Telephone Encounter (Signed)
Spoke with patient and moved esophageal manometry up to 12/27/22 @ 2 pm. Patient informed.

## 2022-11-23 DIAGNOSIS — D1801 Hemangioma of skin and subcutaneous tissue: Secondary | ICD-10-CM | POA: Diagnosis not present

## 2022-11-23 DIAGNOSIS — D2271 Melanocytic nevi of right lower limb, including hip: Secondary | ICD-10-CM | POA: Diagnosis not present

## 2022-11-23 DIAGNOSIS — L821 Other seborrheic keratosis: Secondary | ICD-10-CM | POA: Diagnosis not present

## 2022-11-23 DIAGNOSIS — D2272 Melanocytic nevi of left lower limb, including hip: Secondary | ICD-10-CM | POA: Diagnosis not present

## 2022-11-23 DIAGNOSIS — D2261 Melanocytic nevi of right upper limb, including shoulder: Secondary | ICD-10-CM | POA: Diagnosis not present

## 2022-11-23 DIAGNOSIS — D485 Neoplasm of uncertain behavior of skin: Secondary | ICD-10-CM | POA: Diagnosis not present

## 2022-11-23 DIAGNOSIS — D2262 Melanocytic nevi of left upper limb, including shoulder: Secondary | ICD-10-CM | POA: Diagnosis not present

## 2022-11-23 DIAGNOSIS — L661 Lichen planopilaris: Secondary | ICD-10-CM | POA: Diagnosis not present

## 2022-12-05 ENCOUNTER — Telehealth: Payer: Self-pay | Admitting: Cardiology

## 2022-12-05 NOTE — Telephone Encounter (Signed)
Patient states she has been having spasms in her chest for the past 3 days. She denies having any pain. She states she has also been fatigued/tired. She states the spasms only last for a few seconds, but they have become more frequent and persistent. She states she has experienced this in the past, but it normally only occurs for 1 day.

## 2022-12-05 NOTE — Telephone Encounter (Signed)
Chest spasms are center of chest and slightly to the left.  She states feels like "a spasm" not painful but annoying.  She states she has HX of Gerd. She states off and on since Sunday. Prior to this was only occasionally. She states has been discussed before.  She takes Dexilant Daily and Pepcid Twice a day.  She states no other symptoms other than this.  Then states she has had burping and some reflux last night. She is also "gassy" and using the bathroom a lot. (Takes Linzess) She has esophageal manometry  scheduled for 10/23.  Please advise

## 2022-12-06 ENCOUNTER — Emergency Department (HOSPITAL_COMMUNITY): Payer: Medicare Other

## 2022-12-06 ENCOUNTER — Other Ambulatory Visit: Payer: Self-pay

## 2022-12-06 ENCOUNTER — Encounter (HOSPITAL_COMMUNITY): Payer: Self-pay

## 2022-12-06 ENCOUNTER — Emergency Department (HOSPITAL_COMMUNITY)
Admission: EM | Admit: 2022-12-06 | Discharge: 2022-12-07 | Disposition: A | Discharge status: Home / Self Care | Payer: Medicare Other | Attending: Emergency Medicine | Admitting: Emergency Medicine

## 2022-12-06 ENCOUNTER — Telehealth: Payer: Self-pay | Admitting: Gastroenterology

## 2022-12-06 DIAGNOSIS — R002 Palpitations: Secondary | ICD-10-CM | POA: Diagnosis not present

## 2022-12-06 DIAGNOSIS — R0602 Shortness of breath: Secondary | ICD-10-CM | POA: Insufficient documentation

## 2022-12-06 DIAGNOSIS — I700 Atherosclerosis of aorta: Secondary | ICD-10-CM | POA: Diagnosis not present

## 2022-12-06 LAB — CBC
HCT: 39.8 % (ref 36.0–46.0)
Hemoglobin: 13.6 g/dL (ref 12.0–15.0)
MCH: 30 pg (ref 26.0–34.0)
MCHC: 34.2 g/dL (ref 30.0–36.0)
MCV: 87.7 fL (ref 80.0–100.0)
Platelets: 239 10*3/uL (ref 150–400)
RBC: 4.54 MIL/uL (ref 3.87–5.11)
RDW: 12.3 % (ref 11.5–15.5)
WBC: 4.4 10*3/uL (ref 4.0–10.5)
nRBC: 0 % (ref 0.0–0.2)

## 2022-12-06 LAB — BASIC METABOLIC PANEL
Anion gap: 16 — ABNORMAL HIGH (ref 5–15)
BUN: 10 mg/dL (ref 8–23)
CO2: 24 mmol/L (ref 22–32)
Calcium: 10.1 mg/dL (ref 8.9–10.3)
Chloride: 101 mmol/L (ref 98–111)
Creatinine, Ser: 0.89 mg/dL (ref 0.44–1.00)
GFR, Estimated: >60 mL/min (ref >60)
Glucose, Bld: 94 mg/dL (ref 70–99)
Potassium: 4 mmol/L (ref 3.5–5.1)
Sodium: 141 mmol/L (ref 135–145)

## 2022-12-06 LAB — TROPONIN I (HIGH SENSITIVITY): Troponin I (High Sensitivity): 3 ng/L (ref <18)

## 2022-12-06 MED ORDER — HYOSCYAMINE SULFATE 0.125 MG SL SUBL: 0.1250 mg | SUBLINGUAL_TABLET | Freq: Four times a day (QID) | SUBLINGUAL | 1 refills | Status: DC

## 2022-12-06 NOTE — Telephone Encounter (Signed)
Patient returned RN Cheryl's call.

## 2022-12-06 NOTE — Telephone Encounter (Signed)
Left voicemail to return call to office.

## 2022-12-06 NOTE — Telephone Encounter (Signed)
Discomfort in chest that only lasts for few seconds suggest likely noncardiac.  However if having any sustained chest discomfort, she needs to go to the ED.  Does she feel like this is more like palpitations than chest pain?  Can we schedule her an appointment with either me or APP to discuss

## 2022-12-06 NOTE — ED Provider Triage Note (Signed)
Emergency Medicine Provider Triage Evaluation Note  Crystal Wong , a 67 y.o. female  was evaluated in triage.  Pt complains of palpitations x 4 days. Has had something before, but never for this long or been this severe. Feels like a spasm or flutter. Felt worse as the day went on today. Had a PCP appointment in the morning tomorrow but got worried and came to ER for evaluation. Not having any pain but felt short of breath this afternoon.   Hx of GERD, constipation, GI has her on linzess but stopped it 2 days ago due to diarrhea. Follows with Dr Bjorn Pippin with cardiology  Review of Systems  Positive: Palpitations, SOB, chills Negative: Cough, wheezing, leg pain or swelling  Physical Exam  BP 139/81 (BP Location: Right Arm)   Pulse 91   Temp 98.5 F (36.9 C) (Oral)   Resp 18   Ht 5\' 6"  (1.676 m)   Wt 78 kg   SpO2 100%   BMI 27.76 kg/m  Gen:   Awake, no distress   Resp:  Normal effort  MSK:   Moves extremities without difficulty  Other:    Medical Decision Making  Medically screening exam initiated at 7:40 PM.  Appropriate orders placed.  [name] was informed that the remainder of the evaluation will be completed by another provider, this initial triage assessment does not replace that evaluation, and the importance of remaining in the ED until their evaluation is complete.  Workup initiated   Su Monks, PA-C 12/06/22 1944

## 2022-12-06 NOTE — Telephone Encounter (Signed)
Patient is calling to follow up. Please advise.

## 2022-12-06 NOTE — Telephone Encounter (Signed)
Spoke to patient she stated she decided she would like to be seen.Appointment scheduled with Carlos Levering NP 10/4 at 2:45 pm.

## 2022-12-06 NOTE — Telephone Encounter (Signed)
Spoke to patient she stated she has been having chest discomfort between breast off and on since this past Sunday 9/29.Each episode last appox 1 to 2 mins.She stated she has appointment with PCP tomorrow 10/3.She will call back if he advises her to see Dr.Schumann.

## 2022-12-06 NOTE — Telephone Encounter (Signed)
The pt has been advised that Levsin has been sent to the pharmacy.  She will also touch base with cardiology. She does have an appt with PCP tomorrow.

## 2022-12-06 NOTE — ED Triage Notes (Addendum)
Pt coming in via POV. Pt states she's been having palpations that started on Sunday. Endorses sob, dizziness, and pressure in chest. Denies n/v, headache, blurred vision. Pt states she has a heart monitor and sees a cardiologist. She states she's never had anything like this happen to her before. Pt alert and oriented x4.

## 2022-12-06 NOTE — Telephone Encounter (Signed)
Linzess began working on Saturday with diarrhea.  She says that then on Sunday she began to have spasms in the chest and she believes maybe the esophagus.  She has had this in the past and saw cardiology and had a extensive workup in April and was told everything was fine.  She would like to know if there is anything she can take.  She has been on semaglutide (ozempic) for 3 months.  She also has called cardiology as well.  She will also call PCP and make them aware of the symptoms in case ozempic may be the cause.  Please advise if pt can have antispasmodic prescribed.

## 2022-12-06 NOTE — Telephone Encounter (Signed)
Patient called requesting to speak with a nurse she is experiencing spasms along the chest and stomach area

## 2022-12-07 ENCOUNTER — Ambulatory Visit: Payer: Medicare Other | Admitting: Family

## 2022-12-07 MED ORDER — PANTOPRAZOLE SODIUM 20 MG PO TBEC: 20.0000 mg | DELAYED_RELEASE_TABLET | Freq: Every day | ORAL | 0 refills | Status: DC

## 2022-12-07 MED ORDER — ALUM & MAG HYDROXIDE-SIMETH 200-200-20 MG/5ML PO SUSP
30.0000 mL | Freq: Once | ORAL | Status: AC
  Administered 2022-12-07: 30 mL via ORAL
  Filled 2022-12-07: qty 30

## 2022-12-07 NOTE — ED Provider Notes (Signed)
[facility]ENC[facility]PT [facility] Emergency Department Provider Note MRN:  [id number]  Arrival date & time: 12/07/22     Chief Complaint   Palpitations and Shortness of Breath   History of Present Illness   Crystal Wong is a 67 y.o. year-old female presents to the ED with chief complaint of palpitations.  States that she has been having the symptoms for the past few days.  She states that she has hx of GERD and takes dexilant and pepcid for many years.  She states that it feels like she needs to hiccup.  She denies fever, chills, cough, or SOB.  History provided by patient.   Review of Systems  Pertinent positive and negative review of systems noted in HPI.    Physical Exam   Vitals:   12/06/22 1917 12/07/22 0215  BP: 139/81 121/69  Pulse: 91 78  Resp: 18 16  Temp: 98.5 F (36.9 C) 98.4 F (36.9 C)  SpO2: 100% 100%    CONSTITUTIONAL:  well-appearing, NAD NEURO:  Alert and oriented x 3, CN 3-12 grossly intact EYES:  eyes equal and reactive ENT/NECK:  Supple, no stridor  CARDIO:  normal rate, regular rhythm, appears well-perfused  PULM:  No respiratory distress, CTAB GI/GU:  non-distended,  MSK/SPINE:  No gross deformities, no edema, moves all extremities  SKIN:  no rash, atraumatic   *Additional and/or pertinent findings included in MDM below  Diagnostic and Interventional Summary    EKG Interpretation Date/Time:  Wednesday December 06 2022 19:22:49 EDT Ventricular Rate:  82 PR Interval:  196 QRS Duration:  136 QT Interval:  428 QTC Calculation: 500 R Axis:   -84  Text Interpretation: Normal sinus rhythm Right bundle branch block Left anterior fascicular block Bifascicular block Abnormal ECG When compared with ECG of 04-Oct-2021 21:49, PREVIOUS ECG IS PRESENT similar to prior no stemi Confirmed by Tanda Rockers ([id number]) on 12/07/2022 2:52:30 AM       Labs Reviewed  BASIC METABOLIC PANEL - Abnormal; Notable for the following components:      Result Value    Anion gap 16 (*)    All other components within normal limits  CBC  TROPONIN I (HIGH SENSITIVITY)    DG Chest 2 View  Final Result      Medications  alum & mag hydroxide-simeth (MAALOX/MYLANTA) 200-200-20 MG/5ML suspension 30 mL (30 mLs Oral Given 12/07/22 0217)     Procedures  /  Critical Care Procedures  ED Course and Medical Decision Making  I have reviewed the triage vital signs, the nursing notes, and pertinent available records from the EMR.  Social Determinants Affecting Complexity of Care: Patient has no clinically significant social determinants affecting this chief complaint..   ED Course: Clinical Course as of 12/07/22 0252  Thu Dec 07, 2022  0248 Troponin I (High Sensitivity) Trop is 3, symptoms have been ongoing for several days, doubt ACS [RB]  0249 CBC No leukocytosis or anemia [RB]  0249 Basic metabolic panel(!) No electrolyte derangement [RB]  0249 EKG 12-Lead No acute ischemic changes [RB]  0249 DG Chest 2 View No obvious opacity, infiltrate, or effusion [RB]    Clinical Course User Index [RB] Roxy Horseman, PA-C    Medical Decision Making Patient here with palpitations that have been ongoing for the past few days.  She states that she's not sure if it's her heart or coming from her GERD.    Labs and imaging are reassuring.  I doubt ACS.    Patient states that  symptoms have subsided and improved even more after having a large belch following GI cocktail.  I think that her symptoms seem to be more GI than cardiac and given reassuring workup, I think she can be safely discharged with PCP follow-up.  She is agreeable with plan.  Amount and/or Complexity of Data Reviewed Labs: ordered. Decision-making details documented in ED Course. Radiology: independent interpretation performed. Decision-making details documented in ED Course. ECG/medicine tests: ordered and independent interpretation performed. Decision-making details documented in ED  Course.  Risk OTC drugs.         Consultants: No consultations were needed in caring for this patient.   Treatment and Plan: I considered admission due to patient's initial presentation, but after considering the examination and diagnostic results, patient will not require admission and can be discharged with outpatient follow-up.    Final Clinical Impressions(s) / ED Diagnoses     ICD-10-CM   1. Palpitations  R00.2       ED Discharge Orders          Ordered    pantoprazole (PROTONIX) 20 MG tablet  Daily        12/07/22 0244              Discharge Instructions Discussed with and Provided to Patient:     Discharge Instructions      Your tests tonight looked good.  Your symptoms are thought to be due to esophageal spasm, which can be caused by acid reflux.  We discussed trying a different medication called protonix.  Please follow-up with your regular doctor.  Return for new or worsening symptoms.       Roxy Horseman, PA-C 12/07/22 0253    Sloan Leiter, DO 12/07/22 [phone]

## 2022-12-07 NOTE — Telephone Encounter (Signed)
I spoke with the pt regarding esophageal spasms.  She was seen in the ED and was told her symptoms are not cardiac.  She was prescribed Levsin but she has not tried that as of today. She will follow the ED instructions on changing PPI to Protonix and she will try the Levsin. She also has a mano scheduled for 10/23 and she will keep that appt as planned.

## 2022-12-07 NOTE — Progress Notes (Deleted)
Cardiology Clinic Note   Date: 12/07/2022 ID: Crystal Wong, DOB [birth year], MRN [id number]  Primary Cardiologist:  Little Ishikawa, MD  Patient Profile    Crystal Wong is a 67 y.o. female who presents to the clinic today for ***    Past medical history significant for: Palpitations. 7-day ZIO 05/26/2022: 2 episodes of SVT, longest 10 beats.  No significant arrhythmias.  Patient triggered events corresponded with sinus rhythm +/- PACs/PVCs and SVT. AI/AS. Echo 06/06/2022: EF 60 to 65%.  No RWMA.  Mild concentric LVH.  Grade I DD.  Normal global strain.  Normal RV function.  Mild AI/AS, mean gradient 9 mmHg.  No significant change from prior echo March 2022. Hyperlipidemia. CT cardiac scoring 06/03/2019: Calcium score of 0. Lipid panel 09/05/2022: LDL 64, HDL 57, TG 100, total 139. GERD. T2DM. Depression/anxiety.     History of Present Illness    Crystal Wong first evaluated by Dr. Bjorn Pippin on 05/19/2019 for preop evaluation at the request of Dr. Orvan Falconer.  At the time of her visit she reported palpitations described as fluttering lasting for 20 minutes to hours.  She also reported episodes of feeling like her heart was racing that she attributed to caffeine use.  She reported burning in her chest secondary to GERD that improved with an increase in acid reducer medication. Echo which showed normal LV/RV function, normal diastolic parameters, mild to moderate AI with mild AS.  14-day ZIO showed no significant arrhythmias.  CT cardiac scoring showed a calcium score of 0.  Patient was seen in the office by Dr. Bjorn Pippin on 05/11/2022 with complaints of a 3-day history of increased episodes of palpitations.  She feels it may be related to her GI issues.  She wore a 7-day ZIO which showed 2 episodes of SVT, longest 10 beats.  Echo showed normal LV/RV function, Grade I DD, mild AI/AS unchanged from prior echo March 2022.  She was last seen by Dr. Bjorn Pippin on 06/15/2022 to follow-up monitor and  echo.  She was doing well at that time and reported resolution of palpitations.  She felt this was related to GI issues and had recently been started on Linzess.  Patient contacted the office on 12/05/2022 with complaints of chest spasms.  Per triage LPN: Chest spasms are center of chest and slightly to the left.  She states feels like "a spasm" not painful but annoying.  She states she has HX of Gerd. She states off and on since Sunday. Prior to this was only occasionally. She states has been discussed before.  She takes Dexilant Daily and Pepcid Twice a day. She states no other symptoms other than this.  Then states she has had burping and some reflux last night. She is also "gassy" and using the bathroom a lot. (Takes Linzess) She has esophageal manometry  scheduled for 10/23.  She called again on 12/06/2022 with complaints of chest discomfort between her breasts lasting 1 to 2 minutes.  Dr. Bjorn Pippin felt discomfort only lasting a few seconds was likely noncardiac.  Patient was advised to go to the ED with sustained chest discomfort.  Patient reported she wanted to be seen and was scheduled for a visit.  Patient presented to the ED on 12/06/2022 with complaints of palpitations, shortness of breath, dizziness, chest pressure.  EKG without ischemic changes.  Troponin negative.  Patient provided with GI cocktail with improvement of symptoms further improved after a large belch.  Patient was discharged to follow-up with PCP.  Today, patient ***   ROS: All other systems reviewed and are otherwise negative except as noted in History of Present Illness.  Studies Reviewed       ***  Risk Assessment/Calculations    {Does this patient have ATRIAL FIBRILLATION?:[phone]}          Physical Exam    VS:  There were no vitals taken for this visit. , BMI There is no height or weight on file to calculate BMI.  GEN: Well nourished, well developed, in no acute distress. Neck: No JVD or carotid  bruits. Cardiac: *** RRR. No murmurs. No rubs or gallops.   Respiratory:  Respirations regular and unlabored. Clear to auscultation without rales, wheezing or rhonchi. GI: Soft, nontender, nondistended. Extremities: Radials/DP/PT 2+ and equal bilaterally. No clubbing or cyanosis. No edema ***  Skin: Warm and dry, no rash. Neuro: Strength intact.  Assessment & Plan   ***  Disposition: ***     {Are you ordering a CV Procedure (e.g. stress test, cath, DCCV, TEE, etc)?   Press F2        :[id number]}   Signed, Etta Grandchild. Lynleigh Kovack, DNP, NP-C

## 2022-12-07 NOTE — Discharge Instructions (Addendum)
Your tests tonight looked good.  Your symptoms are thought to be due to esophageal spasm, which can be caused by acid reflux.  We discussed trying a different medication called protonix.  Please follow-up with your regular doctor.  Return for new or worsening symptoms.

## 2022-12-07 NOTE — Telephone Encounter (Signed)
Patient called stated she was at the ED yesterday and would like to know what she can do to elevate the spasm.

## 2022-12-08 ENCOUNTER — Ambulatory Visit: Payer: Medicare Other | Admitting: Student

## 2022-12-11 DIAGNOSIS — E119 Type 2 diabetes mellitus without complications: Secondary | ICD-10-CM | POA: Diagnosis not present

## 2022-12-11 LAB — HM DIABETES EYE EXAM

## 2022-12-18 ENCOUNTER — Telehealth: Payer: Self-pay | Admitting: Nurse Practitioner

## 2022-12-18 NOTE — Telephone Encounter (Signed)
Patient aware she is due states she is going to get it at [facility]

## 2022-12-18 NOTE — Telephone Encounter (Signed)
Pt wants to know if she is due for a pneumonia shot and how often is she supposed to have one?

## 2022-12-26 NOTE — Plan of Care (Signed)
CHL Tonsillectomy/Adenoidectomy, Postoperative PEDS care plan entered in error.

## 2022-12-27 ENCOUNTER — Encounter (HOSPITAL_COMMUNITY)
Admission: RE | Disposition: A | Discharge status: Home / Self Care | Payer: Self-pay | Source: Home / Self Care | Attending: Gastroenterology

## 2022-12-27 ENCOUNTER — Encounter (HOSPITAL_COMMUNITY): Payer: Self-pay | Admitting: Gastroenterology

## 2022-12-27 ENCOUNTER — Ambulatory Visit (HOSPITAL_COMMUNITY)
Admission: RE | Admit: 2022-12-27 | Discharge: 2022-12-27 | Disposition: A | Discharge status: Home / Self Care | Payer: Medicare Other | Attending: Gastroenterology | Admitting: Gastroenterology

## 2022-12-27 DIAGNOSIS — R053 Chronic cough: Secondary | ICD-10-CM | POA: Insufficient documentation

## 2022-12-27 DIAGNOSIS — K2289 Other specified disease of esophagus: Secondary | ICD-10-CM | POA: Insufficient documentation

## 2022-12-27 DIAGNOSIS — K224 Dyskinesia of esophagus: Secondary | ICD-10-CM | POA: Diagnosis not present

## 2022-12-27 DIAGNOSIS — R1110 Vomiting, unspecified: Secondary | ICD-10-CM | POA: Diagnosis not present

## 2022-12-27 DIAGNOSIS — K219 Gastro-esophageal reflux disease without esophagitis: Secondary | ICD-10-CM | POA: Diagnosis not present

## 2022-12-27 HISTORY — PX: 24 HOUR PH STUDY: SHX5419

## 2022-12-27 HISTORY — PX: ESOPHAGEAL MANOMETRY: SHX5429

## 2022-12-27 SURGERY — MANOMETRY, ESOPHAGUS

## 2022-12-27 MED ORDER — LIDOCAINE VISCOUS HCL 2 % MT SOLN
OROMUCOSAL | Status: AC
  Filled 2022-12-27: qty 15

## 2022-12-27 SURGICAL SUPPLY — 2 items
FACESHIELD LNG OPTICON STERILE (SAFETY) IMPLANT
GLOVE BIO SURGEON STRL SZ8 (GLOVE) ×4 IMPLANT

## 2022-12-27 NOTE — Progress Notes (Signed)
Esophageal Manometry done per protocol. Pt tolerated well with out complication. Ph with impedance done per protocol. Pt tolerated well. Instructions given regarding the study and monitor. Pt verbalized understand and return demonstrated use of monitor. Pt will return tomorrow to have probe removed and monitor downloaded.  

## 2022-12-30 ENCOUNTER — Encounter (HOSPITAL_COMMUNITY): Payer: Self-pay | Admitting: Gastroenterology

## 2023-01-01 ENCOUNTER — Telehealth: Payer: Self-pay | Admitting: Gastroenterology

## 2023-01-01 NOTE — Telephone Encounter (Signed)
Patient called to inquire about the Manometry results.

## 2023-01-02 NOTE — Telephone Encounter (Signed)
Pt called in regard to her recent Manometry results. Pt was notified that they  have not resulted yet and we will contact her once we have received the results and Dr. Chales Abrahams makes recommendations if any.  Pt stated that she recently went to the ED on 12/06/2022 for the esophageal  spasms and they switched her Dexilant 60 mg daily  to Protonix 20 mg daily. Pt stated that she questions if she needs a higher dose whereas she is experiencing some reflux first thing in the AM and also requesting a prescription also.  Please review and advise

## 2023-01-10 ENCOUNTER — Telehealth: Payer: Self-pay | Admitting: *Deleted

## 2023-01-10 NOTE — Telephone Encounter (Signed)
Informed patient that we do no see results from [facility] for 12/27/2022.  Advised she call the hospital.

## 2023-01-12 NOTE — Telephone Encounter (Signed)
Still waiting for results RG

## 2023-01-15 NOTE — Telephone Encounter (Signed)
Dr Lavon Paganini-  Have you seen anything on this patient?

## 2023-01-16 ENCOUNTER — Encounter: Payer: Self-pay | Admitting: Nurse Practitioner

## 2023-01-16 ENCOUNTER — Ambulatory Visit (INDEPENDENT_AMBULATORY_CARE_PROVIDER_SITE_OTHER): Payer: Medicare Other | Admitting: Nurse Practitioner

## 2023-01-16 ENCOUNTER — Other Ambulatory Visit: Payer: Self-pay

## 2023-01-16 VITALS — BP 104/66 | HR 74 | Temp 97.9°F | Resp 20 | Ht 66.0 in | Wt 170.0 lb

## 2023-01-16 DIAGNOSIS — Z0000 Encounter for general adult medical examination without abnormal findings: Secondary | ICD-10-CM

## 2023-01-16 DIAGNOSIS — Z23 Encounter for immunization: Secondary | ICD-10-CM

## 2023-01-16 MED ORDER — PANTOPRAZOLE SODIUM 20 MG PO TBEC: 20.0000 mg | DELAYED_RELEASE_TABLET | Freq: Every day | ORAL | 2 refills | Status: DC

## 2023-01-16 MED ORDER — PANTOPRAZOLE SODIUM 20 MG PO TBEC: 20.0000 mg | DELAYED_RELEASE_TABLET | Freq: Every day | ORAL | 1 refills | Status: DC

## 2023-01-16 NOTE — Addendum Note (Signed)
Addended by: Cleda Daub on: 01/16/2023 12:25 PM   Modules accepted: Orders

## 2023-01-16 NOTE — Addendum Note (Signed)
Addended by: Bennie Pierini on: 01/16/2023 11:00 AM   Modules accepted: Level of Service

## 2023-01-16 NOTE — Patient Instructions (Signed)
What are Advance Directives? ?A living will allows you to document your wishes concerning medical treatments at the end of life.  ? ?Before your living will can guide medical decision-making two physicians must certify: ?You are unable to make medical decisions,  ?You are in the medical condition specified in the state's living will law (such as "terminal illness" or "permanent unconsciousness"),  ?Other requirements also may apply, depending upon the state. ?A medical power of attorney (or healthcare proxy) allows you to appoint a person you trust as your healthcare agent (or surrogate decision maker), who is authorized to make medical decisions on your behalf.  ? ?Before a medical power of attorney goes into effect a person?s physician must conclude that they are unable to make their own medical decisions. In addition: ?If a person regains the ability to make decisions, the agent cannot continue to act on the person's behalf.  ?Many states have additional requirements that apply only to decisions about life-sustaining medical treatments.  ?For example, before your agent can refuse a life-sustaining treatment on your behalf, a second physician may have to confirm your doctor's assessment that you are incapable of making treatment decisions. ?What Else Do I Need to Know?  ?Advance directives are legally valid throughout the [place]. While you do not need a lawyer to fill out an advance directive, your advance directive becomes legally valid as soon as you sign them in front of the required witnesses. The laws governing advance directives vary from state to state, so it is important to complete and sign advance directives that comply with your state's law. Also, advance directives can have different titles in different states.  ?Emergency medical technicians cannot honor living wills or medical powers of attorney. Once emergency personnel have been called, they must do what is necessary to stabilize a person  for transfer to a hospital, both from accident sites and from a home or other facility. After a physician fully evaluates the person's condition and determines the underlying conditions, advance directives can be implemented.  ?One state?s advance directive does not always work in another state. Some states do honor advance directives from another state; others will honor out-of-state advance directives as long as they are similar to the state's own law; and some states do not have an answer to this question. The best solution is if you spend a significant amount of time in more than one state, you should complete the advance directives for all the states you spend a significant amount of time in.  ?Advance directives do not expire. An advance directive remains in effect until you change it. If you complete a new advance directive, it invalidates the previous one.  ?You should review your advance directives periodically to ensure that they still reflect your wishes. If you want to change anything in an advance directive once you have completed it, you should complete a whole new document. ?[place] ? ? ? ?[facility], www.nhpco.org ? ?

## 2023-01-16 NOTE — Progress Notes (Signed)
Subjective:    Crystal Wong is a 67 y.o. female who presents for a Welcome to Medicare exam.   Cardiac Risk Factors include: advanced age (>50men, >57 women)      Objective:    Today's Vitals   01/16/23 1032  BP: 104/66  Pulse: 74  Resp: 20  Temp: 97.9 F (36.6 C)  TempSrc: Temporal  SpO2: 97%  Weight: 170 lb (77.1 kg)  Height: 5\' 6"  (1.676 m)  Body mass index is 27.44 kg/m.  Medications Outpatient Encounter Medications as of 01/16/2023  Medication Sig   albuterol (VENTOLIN HFA) 108 (90 Base) MCG/ACT inhaler 2 puff q6 prn   budesonide-formoterol (SYMBICORT) 80-4.5 MCG/ACT inhaler Take 2 puffs first thing in am and then another 2 puffs about 12 hours later.   citalopram (CELEXA) 40 MG tablet Take 1 tablet (40 mg total) by mouth daily.   doxycycline (VIBRAMYCIN) 100 MG capsule Take 100 mg by mouth daily.   dutasteride (AVODART) 0.5 MG capsule Take 0.5 mg by mouth daily.   famotidine (PEPCID) 20 MG tablet Take 1 tablet (20 mg total) by mouth 2 (two) times daily.   fluticasone (FLONASE) 50 MCG/ACT nasal spray Place 2 sprays into both nostrils daily.   hydrocortisone (ANUSOL-HC) 25 MG suppository PLACE 1 SUPPOSITORY (25 MG TOTAL) RECTALLY 2 (TWO) TIMES DAILY.   hyoscyamine (LEVSIN/SL) 0.125 MG SL tablet Place 1 tablet (0.125 mg total) under the tongue every 6 (six) hours.   linaclotide (LINZESS) 290 MCG CAPS capsule Take 1 capsule (290 mcg total) by mouth daily before breakfast.   LORATADINE-D 24HR 10-240 MG 24 hr tablet Take 1 tablet by mouth once daily   LORazepam (ATIVAN) 1 MG tablet Take 1 tablet (1 mg total) by mouth every 8 (eight) hours as needed.   melatonin 5 MG TABS Take 5 mg by mouth at bedtime.   rosuvastatin (CRESTOR) 40 MG tablet Take 1 tablet (40 mg total) by mouth daily.   Semaglutide,0.25 or 0.5MG /DOS, 2 MG/3ML SOPN Inject 0.5 mg into the skin once a week.   VITAMIN D, CHOLECALCIFEROL, PO Take 1 tablet by mouth daily.    [DISCONTINUED] pantoprazole  (PROTONIX) 20 MG tablet Take 1 tablet (20 mg total) by mouth daily.   No facility-administered encounter medications on file as of 01/16/2023.     History: Past Medical History:  Diagnosis Date   Anxiety    Asthma    Depression    GERD (gastroesophageal reflux disease)    Heart murmur    Hyperlipidemia    Insomnia    Past Surgical History:  Procedure Laterality Date   23 HOUR PH STUDY N/A 12/27/2022   Procedure: 24 HOUR PH STUDY;  Surgeon: Lynann Bologna, MD;  Location: [facility];  Service: Gastroenterology;  Laterality: N/A;  with impectence   ANKLE FRACTURE SURGERY Left    Pins and screws   BREAST LUMPECTOMY Bilateral    left x 1, right x 2   CARPAL TUNNEL RELEASE Right    CARPAL TUNNEL RELEASE Left    COLONOSCOPY     ESOPHAGEAL MANOMETRY N/A 12/27/2022   Procedure: ESOPHAGEAL MANOMETRY (EM);  Surgeon: Lynann Bologna, MD;  Location: [facility];  Service: Gastroenterology;  Laterality: N/A;   KNEE ARTHROSCOPY Bilateral    PARTIAL HYSTERECTOMY      Family History  Problem Relation Age of Onset   Heart disease Mother    Diabetes Mother    Colon polyps Mother    Colon cancer Father 36   Gallbladder  disease Daughter    Gallstones Paternal Grandmother    Esophageal cancer Neg Hx    Stomach cancer Neg Hx    Rectal cancer Neg Hx    Breast cancer Neg Hx    Social History   Occupational History   Occupation: retired  Tobacco Use   Smoking status: Never   Smokeless tobacco: Never  Vaping Use   Vaping status: Never Used  Substance and Sexual Activity   Alcohol use: Not Currently   Drug use: No   Sexual activity: Yes    Tobacco Counseling Counseling given: Not Answered   Immunizations and Health Maintenance Immunization History  Administered Date(s) Administered   Influenza Inj Mdck Quad Pf 11/15/2021   Influenza, High Dose Seasonal PF 11/20/2022   Influenza,inj,Quad PF,6+ Mos 12/30/2012, 12/23/2016, 12/11/2017, 11/25/2018, 11/14/2019, 11/05/2020    Influenza,inj,quad, With Preservative 12/23/2016   Influenza-Unspecified 12/15/2013, 12/10/2015   Moderna Covid-19 Fall Seasonal Vaccine 28yrs & older 11/20/2022   Moderna Sars-Covid-2 Vaccination 04/03/2019, 05/01/2019, 01/01/2020   Pneumococcal Conjugate-13 12/11/2017   Respiratory Syncytial Virus Vaccine,Recomb Aduvanted(Arexvy) 03/30/2022   Tdap 05/05/2009, 03/03/2021   Zoster Recombinant(Shingrix) 03/03/2021, 08/30/2021   Zoster, Live 12/15/2013   Health Maintenance Due  Topic Date Due   Medicare Annual Wellness (AWV)  Never done   FOOT EXAM  Never done   HIV Screening  Never done   Diabetic kidney evaluation - Urine ACR  Never done   Pneumonia Vaccine 65+ Years old (2 of 2 - PPSV23 or PCV20) 09/03/2022    Activities of Daily Living    01/16/2023   10:36 AM 01/15/2023    3:00 PM  In your present state of health, do you have any difficulty performing the following activities:  Hearing? 0 0  Vision? 0 0  Difficulty concentrating or making decisions? 0 0  Walking or climbing stairs? 0 0  Dressing or bathing? 0 0  Doing errands, shopping? 0 0  Preparing Food and eating ? N N  Using the Toilet? N N  In the past six months, have you accidently leaked urine? N Y  Do you have problems with loss of bowel control? N N  Managing your Medications? N N  Managing your Finances? N N  Housekeeping or managing your Housekeeping? N N    Physical Exam   Physical Exam (optional), or other factors deemed appropriate based on the beneficiary's medical and social history and current clinical standards.   Advanced Directives: Does Patient Have a Medical Advance Directive?: No Would patient like information on creating a medical advance directive?: No - Patient declined  EKG: reviewed previous       Assessment:    This is a routine wellness examination for this patient . Welcome  to medicare  Vision/Hearing screen No results found.   Goals      DIET - EAT MORE FRUITS AND  VEGETABLES     Exercise 150 min/wk Moderate Activity        Depression Screen    01/16/2023   10:42 AM 09/05/2022   11:23 AM 07/11/2022    9:20 AM 03/28/2022   12:25 PM  PHQ 2/9 Scores  PHQ - 2 Score 0 0 0 0  PHQ- 9 Score  0 0 0     Fall Risk    01/16/2023   10:44 AM  Fall Risk   Falls in the past year? 0    Cognitive Function:    01/16/2023   10:44 AM  MMSE - Mini Mental State Exam  Orientation to time 5  Orientation to Place 5  Registration 3  Attention/ Calculation 5  Recall 3  Language- name 2 objects 2  Language- repeat 1  Language- follow 3 step command 3  Language- read & follow direction 1  Write a sentence 1  Copy design 1  Total score 30        Patient Care Team: Bennie Pierini, FNP as PCP - General (Family Medicine) Little Ishikawa, MD as PCP - Cardiology (Cardiology) Janalyn Harder, MD (Inactive) as Consulting Physician (Dermatology)     Plan:   Welcome to medicare   I have personally reviewed and noted the following in the patient's chart:   Medical and social history Use of alcohol, tobacco or illicit drugs  Current medications and supplements Functional ability and status Nutritional status Physical activity Advanced directives List of other physicians Hospitalizations, surgeries, and ER visits in previous 12 months Vitals Screenings to include cognitive, depression, and falls Referrals and appointments  In addition, I have reviewed and discussed with patient certain preventive protocols, quality metrics, and best practice recommendations. A written personalized care plan for preventive services as well as general preventive health recommendations were provided to patient.     [name], [place] 01/16/2023

## 2023-01-18 NOTE — Telephone Encounter (Signed)
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  Given she is having significant symptoms from esophageal spasms, can consider starting low-dose diltiazem or sildenafil to decrease the symptoms.  Good acid suppression on PPI, continue current regimen and antireflux measures.  I will give you a copy of the report .  Thank you

## 2023-01-19 NOTE — Telephone Encounter (Signed)
Left message for patient to call back  

## 2023-01-22 NOTE — Telephone Encounter (Signed)
PT returning call. Please advise.

## 2023-01-23 ENCOUNTER — Ambulatory Visit: Payer: Medicare Other | Admitting: Nurse Practitioner

## 2023-01-23 VITALS — BP 103/63 | HR 78 | Temp 97.5°F | Resp 20 | Ht 66.0 in | Wt 170.0 lb

## 2023-01-23 DIAGNOSIS — H65193 Other acute nonsuppurative otitis media, bilateral: Secondary | ICD-10-CM

## 2023-01-23 DIAGNOSIS — J3089 Other allergic rhinitis: Secondary | ICD-10-CM | POA: Diagnosis not present

## 2023-01-23 DIAGNOSIS — F411 Generalized anxiety disorder: Secondary | ICD-10-CM

## 2023-01-23 MED ORDER — METHYLPREDNISOLONE ACETATE 80 MG/ML IJ SUSP
80.0000 mg | Freq: Once | INTRAMUSCULAR | Status: AC
Start: 2023-01-23 — End: 2023-01-23
  Administered 2023-01-23: 80 mg via INTRAMUSCULAR

## 2023-01-23 MED ORDER — ESCITALOPRAM OXALATE 20 MG PO TABS
20.0000 mg | ORAL_TABLET | Freq: Every day | ORAL | 5 refills | Status: DC
Start: 2023-01-23 — End: 2023-02-15

## 2023-01-23 NOTE — Telephone Encounter (Signed)
Spoke with patient's husband & advised he have patient call back to discuss results. He verbalized all understanding.

## 2023-01-23 NOTE — Patient Instructions (Signed)
Eustachian Tube Dysfunction  Eustachian tube dysfunction refers to a condition in which a blockage develops in the narrow passage that connects the middle ear to the back of the nose (eustachian tube). The eustachian tube regulates air pressure in the middle ear by letting air move between the ear and nose. It also helps to drain fluid from the middle ear space. Eustachian tube dysfunction can affect one or both ears. When the eustachian tube does not function properly, air pressure, fluid, or both can build up in the middle ear. What are the causes? This condition occurs when the eustachian tube becomes blocked or cannot open normally. Common causes of this condition include: Ear infections. Colds and other infections that affect the nose, mouth, and throat (upper respiratory tract). Allergies. Irritation from cigarette smoke. Irritation from stomach acid coming up into the esophagus (gastroesophageal reflux). The esophagus is the part of the body that moves food from the mouth to the stomach. Sudden changes in air pressure, such as from descending in an airplane or scuba diving. Abnormal growths in the nose or throat, such as: Growths that line the nose (nasal polyps). Abnormal growth of cells (tumors). Enlarged tissue at the back of the throat (adenoids). What increases the risk? You are more likely to develop this condition if: You smoke. You are overweight. You are a child who has: Certain birth defects of the mouth, such as cleft palate. Large tonsils or adenoids. What are the signs or symptoms? Common symptoms of this condition include: A feeling of fullness in the ear. Ear pain. Clicking or popping noises in the ear. Ringing in the ear (tinnitus). Hearing loss. Loss of balance. Dizziness. Symptoms may get worse when the air pressure around you changes, such as when you travel to an area of high elevation, fly on an airplane, or go scuba diving. How is this diagnosed? This  condition may be diagnosed based on: Your symptoms. A physical exam of your ears, nose, and throat. Tests, such as those that measure: The movement of your eardrum. Your hearing (audiometry). How is this treated? Treatment depends on the cause and severity of your condition. In mild cases, you may relieve your symptoms by moving air into your ears. This is called "popping the ears." In more severe cases, or if you have symptoms of fluid in your ears, treatment may include: Medicines to relieve congestion (decongestants). Medicines that treat allergies (antihistamines). Nasal sprays or ear drops that contain medicines that reduce swelling (steroids). A procedure to drain the fluid in your eardrum. In this procedure, a small tube may be placed in the eardrum to: Drain the fluid. Restore the air in the middle ear space. A procedure to insert a balloon device through the nose to inflate the opening of the eustachian tube (balloon dilation). Follow these instructions at home: Lifestyle Do not do any of the following until your health care provider approves: Travel to high altitudes. Fly in airplanes. Work in a pressurized cabin or room. Scuba dive. Do not use any products that contain nicotine or tobacco. These products include cigarettes, chewing tobacco, and vaping devices, such as e-cigarettes. If you need help quitting, ask your health care provider. Keep your ears dry. Wear fitted earplugs during showering and bathing. Dry your ears completely after. General instructions Take over-the-counter and prescription medicines only as told by your health care provider. Use techniques to help pop your ears as recommended by your health care provider. These may include: Chewing gum. Yawning. Frequent, forceful swallowing.   Closing your mouth, holding your nose closed, and gently blowing as if you are trying to blow air out of your nose. Keep all follow-up visits. This is important. Contact a  health care provider if: Your symptoms do not go away after treatment. Your symptoms come back after treatment. You are unable to pop your ears. You have: A fever. Pain in your ear. Pain in your head or neck. Fluid draining from your ear. Your hearing suddenly changes. You become very dizzy. You lose your balance. Get help right away if: You have a sudden, severe increase in any of your symptoms. Summary Eustachian tube dysfunction refers to a condition in which a blockage develops in the eustachian tube. It can be caused by ear infections, allergies, inhaled irritants, or abnormal growths in the nose or throat. Symptoms may include ear pain or fullness, hearing loss, or ringing in the ears. Mild cases are treated with techniques to unblock the ears, such as yawning or chewing gum. More severe cases are treated with medicines or procedures. This information is not intended to replace advice given to you by your health care provider. Make sure you discuss any questions you have with your health care provider. Document Revised: 05/03/2020 Document Reviewed: 05/03/2020 Elsevier Patient Education  2024 [employer] Inc.  

## 2023-01-23 NOTE — Progress Notes (Signed)
Subjective:    Patient ID: Crystal Wong, female    DOB: August 13, 1955, 67 y.o.   MRN: [id number]   Chief Complaint: Allergic Rhinitis  and bilateral ear pain   HPI  Patient comes in c/o allergy flare up that she has every years. Says both of her ears feel stopped up. Start a couple of weeks ago. Has been  taking zyrtec and flonase. No relief.  C/O feeling on edge. Thinks her celexa is not working as well as it did.     01/23/2023    8:09 AM 09/05/2022   11:23 AM 07/11/2022    9:21 AM 02/16/2022    9:39 AM  GAD 7 : Generalized Anxiety Score  Nervous, Anxious, on Edge 1 0 0 0  Control/stop worrying 0 0 0 0  Worry too much - different things 0 0 0 0  Trouble relaxing 0 0 0 0  Restless 0 0 0 0  Easily annoyed or irritable 1 0 2 0  Afraid - awful might happen 0 0 0 0  Total GAD 7 Score 2 0 2 0  Anxiety Difficulty Somewhat difficult Not difficult at all Not difficult at all Not difficult at all       01/23/2023    8:08 AM 01/16/2023   10:42 AM 09/05/2022   11:23 AM 07/11/2022    9:20 AM 03/28/2022   12:25 PM  Depression screen PHQ 2/9  Decreased Interest 0 0 0 0 0  Down, Depressed, Hopeless 0 0 0 0 0  PHQ - 2 Score 0 0 0 0 0  Altered sleeping 0  0 0 0  Tired, decreased energy 0  0 0 0  Change in appetite 0  0 0 0  Feeling bad or failure about yourself  0  0 0 0  Trouble concentrating 0  0 0 0  Moving slowly or fidgety/restless 0  0 0 0  Suicidal thoughts 0  0 0 0  PHQ-9 Score 0  0 0 0  Difficult doing work/chores   Not difficult at all Not difficult at all Not difficult at all    Patient Active Problem List   Diagnosis Date Noted   Chronic cough 10/25/2022   Constipation 08/07/2022   Diabetes mellitus without complication (HCC) 07/11/2022   Cough variant asthma 04/24/2016   BMI 33.0-33.9,adult 03/02/2016   Mixed hyperlipidemia 08/23/2012   Insomnia 08/23/2012   GERD (gastroesophageal reflux disease) 08/23/2012   Depression 08/23/2012   GAD (generalized anxiety  disorder) 08/23/2012       Review of Systems  Constitutional:  Negative for chills and fever.  HENT:  Positive for congestion, ear pain, rhinorrhea, sinus pressure, sneezing and sore throat. Negative for sinus pain.   Respiratory:  Positive for cough. Negative for shortness of breath.        Objective:   Physical Exam Constitutional:      Appearance: Normal appearance.  HENT:     Right Ear: A middle ear effusion is present. There is no impacted cerumen. Tympanic membrane is bulging.     Left Ear: A middle ear effusion is present. There is no impacted cerumen. Tympanic membrane is bulging.     Nose: Congestion and rhinorrhea present.     Right Sinus: No maxillary sinus tenderness or frontal sinus tenderness.     Left Sinus: No maxillary sinus tenderness or frontal sinus tenderness.  Cardiovascular:     Rate and Rhythm: Normal rate and regular rhythm.     Heart  sounds: Normal heart sounds.  Pulmonary:     Breath sounds: Normal breath sounds.  Neurological:     General: No focal deficit present.     Mental Status: She is alert and oriented to person, place, and time.  Psychiatric:        Mood and Affect: Mood normal.        Behavior: Behavior normal.    BP 103/63   Pulse 78   Temp (!) 97.5 F (36.4 C) (Oral)   Resp 20   Ht 5\' 6"  (1.676 m)   Wt 170 lb (77.1 kg)   SpO2 98%   BMI 27.44 kg/m         Assessment & Plan:   Crystal Wong in today with chief complaint of Allergic Rhinitis  and bilateral ear pain   1. GAD (generalized anxiety disorder) Stress management - escitalopram (LEXAPRO) 20 MG tablet; Take 1 tablet (20 mg total) by mouth daily.  Dispense: 30 tablet; Refill: 5  2. Acute middle ear effusion, bilateral Continue flonase as prescribed  3. Non-seasonal allergic rhinitis, unspecified trigger 1. Take meds as prescribed 2. Use a cool mist humidifier especially during the winter months and when heat has been humid. 3. Use saline nose sprays  frequently 4. Saline irrigations of the nose can be very helpful if done frequently.  * 4X daily for 1 week*  * Use of a nettie pot can be helpful with this. Follow directions with this* 5. Drink plenty of fluids 6. Keep thermostat turn down low 7.For any cough or congestion- OTC meds 8. For fever or aces or pains- take tylenol or ibuprofen appropriate for age and weight.  * for fevers greater than 101 orally you may alternate ibuprofen and tylenol every  3 hours.    - methylPREDNISolone acetate (DEPO-MEDROL) injection 80 mg    The above assessment and management plan was discussed with the patient. The patient verbalized understanding of and has agreed to the management plan. Patient is aware to call the clinic if symptoms persist or worsen. Patient is aware when to return to the clinic for a follow-up visit. Patient educated on when it is appropriate to go to the emergency department.   [name], FNP

## 2023-01-24 NOTE — Telephone Encounter (Signed)
Patient has been advised of recent results from manometry showing hypercontractile esophagus. Patient is interested in beginning diltiazem therapy to help with symptom relief.   Patient states that she was previously on Dexilant but this was changed to pantoprazole 20 mg daily by ER physician. Although the pantoprazole 20 mg once daily has helped more than the previous Dexilant prescription, she wants to insure that this dosage is high enough to give her adequate acid suppression.  Patient is advised to continue following antireflux measures as previously recommended. She verbalizes understanding.  Dr Chales Abrahams,  Please advise on sig for diltiazem as well as pantoprazole.

## 2023-01-24 NOTE — Telephone Encounter (Signed)
Patient called bak requesting a call at [phone]

## 2023-01-25 MED ORDER — DILTIAZEM HCL ER COATED BEADS 120 MG PO CP24: 120.0000 mg | ORAL_CAPSULE | Freq: Every day | ORAL | 1 refills | Status: DC

## 2023-01-25 MED ORDER — PANTOPRAZOLE SODIUM 40 MG PO TBEC: 40.0000 mg | DELAYED_RELEASE_TABLET | Freq: Two times a day (BID) | ORAL | 3 refills | Status: DC

## 2023-01-25 MED ORDER — PANTOPRAZOLE SODIUM 20 MG PO TBEC: 20.0000 mg | DELAYED_RELEASE_TABLET | Freq: Two times a day (BID) | ORAL | 3 refills | Status: DC

## 2023-01-25 NOTE — Telephone Encounter (Signed)
I have spoken to patient regarding Dr Urban Gibson recommendations and she verbalizes understanding. Patient will wait to begin diltiazem 120 mg until Monday, 01/29/23 so that she can come for a blood pressure check at our office on 02/05/23 at 11 am. She is also advised she should take her blood pressure/pulse on her own for the first several days after starting her prescription. Discussed pantoprazole 20 mg twice daily dosing as well as peppermint altoids, 2 mints before each meal to help with spasm/chest discomfort. Advised that cardizem may cause constipation so she should continue Miralax and Linzess as directed.

## 2023-01-25 NOTE — Telephone Encounter (Signed)
24-hour pH/esophageal manometry 12/27/2022 pH study: -Normal esophageal acid exposure.  DeMeester score 6.0.  Good acid suppression on PPIs -Normal esophageal acid exposure in upright and supine position -Positive symptom correlation with regurgitation.  Esophageal manometry -Normal relaxation of EG junction.  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  I have discussed with Dr. Lavon Paganini  Plan: -Pantoprazole 20 mg p.o. twice daily #180, 4RF -Cardizem CD 120 mg once a day (this is the lowest dose of longer acting Cardizem).  Patient to monitor pulse and blood pressure herself for next few days after starting Cardizem.  Also please have her come in for nurse visit to check blood pressure in 1 week -Should take 2 Altoid mints- S/L before each meal as needed (peppermint oil helps) -Let us know how she feels in 4 weeks.   I have called Danasia-left a detailed message on her cell phone. Must continue taking MiraLAX as Cardizem may cause constipation.

## 2023-01-27 ENCOUNTER — Other Ambulatory Visit: Payer: Self-pay | Admitting: Family Medicine

## 2023-01-27 DIAGNOSIS — K648 Other hemorrhoids: Secondary | ICD-10-CM

## 2023-02-03 ENCOUNTER — Other Ambulatory Visit: Payer: Self-pay | Admitting: Cardiology

## 2023-02-03 DIAGNOSIS — E782 Mixed hyperlipidemia: Secondary | ICD-10-CM

## 2023-02-03 DIAGNOSIS — I350 Nonrheumatic aortic (valve) stenosis: Secondary | ICD-10-CM

## 2023-02-05 ENCOUNTER — Ambulatory Visit: Payer: Medicare Other | Admitting: Gastroenterology

## 2023-02-05 VITALS — BP 100/56

## 2023-02-05 DIAGNOSIS — Z0130 Encounter for examination of blood pressure without abnormal findings: Secondary | ICD-10-CM

## 2023-02-05 NOTE — Progress Notes (Unsigned)
Patient came in and her blood pressure was 100/56. She said that being on the Cardizem it has made her constipation 10 times worse and she does linzess and Miralax daily. She did take her Cardizem at 830am she said. Please advise

## 2023-02-07 ENCOUNTER — Telehealth: Payer: Self-pay | Admitting: Gastroenterology

## 2023-02-07 NOTE — Telephone Encounter (Signed)
Inbound call from patient stating the medication diltiazem medication has been causing more problems for her. Wishing for a call back to discuss further. Please advise, thank you.

## 2023-02-07 NOTE — Telephone Encounter (Signed)
Pt stated that she has noticed that since she started taking the Diltiazem that she has been" feeling a dryness from her throat down to her stomach, increased tiredness and increased constipation. " Please review and advise

## 2023-02-08 NOTE — Telephone Encounter (Signed)
Inbound call from patient requesting a call to discuss previous notes. Also requesting a call to discuss 10/23 procedure results. States she would like to confirm diagnosis. Please advise, thank you.

## 2023-02-09 NOTE — Telephone Encounter (Signed)
Left message for pt to call back  °

## 2023-02-09 NOTE — Progress Notes (Signed)
We please call and ask if Cardizem is working for esophageal spasms? Other option is to try Cardizem every other day Add MiraLAX 17 g p.o. BID for now Let us know how she is in 3 to 4 weeks RG

## 2023-02-09 NOTE — Progress Notes (Unsigned)
LVM on both numbers for patient to call back

## 2023-02-12 ENCOUNTER — Telehealth: Payer: Self-pay | Admitting: Nurse Practitioner

## 2023-02-12 NOTE — Telephone Encounter (Signed)
Copied from CRM [id number]. Topic: Referral - Question >> Feb 12, 2023 11:26 AM Dimitri Ped wrote: Reason for CRM: patient is calling about the messages she received about a foot exam and yearly kidney health urine diabetes check . Patient say she doesn't understand what this have to do with her cardiology doctor .want to know if she can come to clinic to have this done

## 2023-02-13 ENCOUNTER — Other Ambulatory Visit: Payer: Self-pay

## 2023-02-13 MED ORDER — HYOSCYAMINE SULFATE 0.125 MG SL SUBL: 0.1250 mg | SUBLINGUAL_TABLET | Freq: Four times a day (QID) | SUBLINGUAL | 4 refills | Status: DC | PRN

## 2023-02-13 NOTE — Progress Notes (Signed)
Can use Levsin sublingual 0.125 every 4-6 hours as needed for esophageal spasms, in case she has it RG

## 2023-02-13 NOTE — Telephone Encounter (Signed)
Pt stated that she has already spoke to [place] this AM. Pt verbalized understanding with all questions answered.

## 2023-02-13 NOTE — Progress Notes (Signed)
Patient said that she stopped her Cardizem because she saw the cardiologist and he said to stop the medication because her blood pressure dropped even lower and she was really dragging so the cardiologist said she need a medication that isn't a blood pressure medication. Please advise She said she isn't having much esophageal spasms but she doesn't know when they would come about when it happens but she can be bad only when she have GI isssues

## 2023-02-13 NOTE — Progress Notes (Signed)
Medication sent and patient was told to call in 2-3 week with an update and she voiced understanding

## 2023-02-14 ENCOUNTER — Encounter: Payer: Self-pay | Admitting: Gastroenterology

## 2023-02-15 ENCOUNTER — Other Ambulatory Visit: Payer: Self-pay | Admitting: Nurse Practitioner

## 2023-02-15 DIAGNOSIS — F411 Generalized anxiety disorder: Secondary | ICD-10-CM

## 2023-02-28 ENCOUNTER — Other Ambulatory Visit: Payer: Self-pay | Admitting: Nurse Practitioner

## 2023-02-28 DIAGNOSIS — K2100 Gastro-esophageal reflux disease with esophagitis, without bleeding: Secondary | ICD-10-CM

## 2023-03-05 ENCOUNTER — Other Ambulatory Visit: Payer: Self-pay | Admitting: Nurse Practitioner

## 2023-03-05 ENCOUNTER — Telehealth: Payer: Self-pay

## 2023-03-05 DIAGNOSIS — F411 Generalized anxiety disorder: Secondary | ICD-10-CM

## 2023-03-05 NOTE — Telephone Encounter (Signed)
Teodoro Kil (Key: [id number]) Ozempic (0.25 or 0.5 MG/DOSE) 2MG /3ML pen-injectors Form [employer] Medicare Part D Electronic Prior Authorization Form (2017 NCPDP) Created 6 days ago Sent to Plan 3 minutes ago Plan Response 3 minutes ago Submit Clinical Questions 1 minute ago Determination Wait for Determination Please wait for [employer] 2017 NCPDP to return a determination.

## 2023-03-06 ENCOUNTER — Other Ambulatory Visit (HOSPITAL_COMMUNITY): Payer: Self-pay

## 2023-03-06 DIAGNOSIS — K224 Dyskinesia of esophagus: Secondary | ICD-10-CM

## 2023-03-06 NOTE — Telephone Encounter (Signed)
 Pharmacy Patient Advocate Encounter  Received notification from [employer] that Prior Authorization for Ozempic  (0.25 or 0.5 MG/DOSE) 2MG /3ML pen-injectors has been APPROVED from 03/06/23 to 03/05/24. Unable to obtain price due to refill too soon rejection, last fill date 03/06/23 next available fill date1/21/25   PA #/Case ID/Reference #: [id number]

## 2023-03-08 MED ORDER — LORAZEPAM 1 MG PO TABS: 1.0000 mg | ORAL_TABLET | Freq: Three times a day (TID) | ORAL | 1 refills | Status: DC | PRN

## 2023-03-13 ENCOUNTER — Ambulatory Visit (INDEPENDENT_AMBULATORY_CARE_PROVIDER_SITE_OTHER): Payer: Medicare Other | Admitting: Nurse Practitioner

## 2023-03-13 ENCOUNTER — Ambulatory Visit (INDEPENDENT_AMBULATORY_CARE_PROVIDER_SITE_OTHER): Payer: Medicare Other

## 2023-03-13 VITALS — BP 115/73 | HR 81 | Temp 97.6°F | Resp 20 | Ht 66.0 in | Wt 162.0 lb

## 2023-03-13 DIAGNOSIS — Z7985 Long-term (current) use of injectable non-insulin antidiabetic drugs: Secondary | ICD-10-CM

## 2023-03-13 DIAGNOSIS — J45991 Cough variant asthma: Secondary | ICD-10-CM

## 2023-03-13 DIAGNOSIS — Z780 Asymptomatic menopausal state: Secondary | ICD-10-CM

## 2023-03-13 DIAGNOSIS — Z6833 Body mass index (BMI) 33.0-33.9, adult: Secondary | ICD-10-CM

## 2023-03-13 DIAGNOSIS — K5901 Slow transit constipation: Secondary | ICD-10-CM

## 2023-03-13 DIAGNOSIS — K2100 Gastro-esophageal reflux disease with esophagitis, without bleeding: Secondary | ICD-10-CM

## 2023-03-13 DIAGNOSIS — Z13820 Encounter for screening for osteoporosis: Secondary | ICD-10-CM | POA: Diagnosis not present

## 2023-03-13 DIAGNOSIS — F411 Generalized anxiety disorder: Secondary | ICD-10-CM

## 2023-03-13 DIAGNOSIS — E119 Type 2 diabetes mellitus without complications: Secondary | ICD-10-CM | POA: Diagnosis not present

## 2023-03-13 DIAGNOSIS — F5101 Primary insomnia: Secondary | ICD-10-CM

## 2023-03-13 DIAGNOSIS — E782 Mixed hyperlipidemia: Secondary | ICD-10-CM

## 2023-03-13 DIAGNOSIS — F3342 Major depressive disorder, recurrent, in full remission: Secondary | ICD-10-CM

## 2023-03-13 LAB — BAYER DCA HB A1C WAIVED: HB A1C (BAYER DCA - WAIVED): 5.3 % (ref 4.8–5.6)

## 2023-03-13 MED ORDER — LINACLOTIDE 290 MCG PO CAPS
290.0000 ug | ORAL_CAPSULE | Freq: Every day | ORAL | 5 refills | Status: DC
Start: 2023-03-13 — End: 2023-09-11

## 2023-03-13 MED ORDER — FAMOTIDINE 20 MG PO TABS: 20.0000 mg | ORAL_TABLET | Freq: Two times a day (BID) | ORAL | 1 refills | Status: DC

## 2023-03-13 MED ORDER — ROSUVASTATIN CALCIUM 40 MG PO TABS: 40.0000 mg | ORAL_TABLET | Freq: Every day | ORAL | 1 refills | Status: DC

## 2023-03-13 MED ORDER — LORAZEPAM 1 MG PO TABS: 1.0000 mg | ORAL_TABLET | Freq: Three times a day (TID) | ORAL | 1 refills | Status: DC | PRN

## 2023-03-13 MED ORDER — PANTOPRAZOLE SODIUM 20 MG PO TBEC: 20.0000 mg | DELAYED_RELEASE_TABLET | Freq: Two times a day (BID) | ORAL | 1 refills | Status: DC

## 2023-03-13 MED ORDER — BUDESONIDE-FORMOTEROL FUMARATE 80-4.5 MCG/ACT IN AERO: INHALATION_SPRAY | RESPIRATORY_TRACT | 12 refills | Status: DC

## 2023-03-13 MED ORDER — SEMAGLUTIDE(0.25 OR 0.5MG/DOS) 2 MG/3ML ~~LOC~~ SOPN: 0.5000 mg | PEN_INJECTOR | SUBCUTANEOUS | 2 refills | Status: DC

## 2023-03-13 MED ORDER — ESCITALOPRAM OXALATE 20 MG PO TABS
20.0000 mg | ORAL_TABLET | Freq: Every day | ORAL | 1 refills | Status: DC
Start: 2023-03-13 — End: 2023-09-11

## 2023-03-13 NOTE — Patient Instructions (Signed)

## 2023-03-13 NOTE — Progress Notes (Signed)
 Subjective:    Patient ID: Crystal Wong, female    DOB: Mar 12, 1955, 68 y.o.   MRN: [id number]   Chief Complaint: No chief complaint on file.    HPI:  Crystal Wong is a 68 y.o. who identifies as a female who was assigned female at birth.   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 Mixed hyperlipidemia Does try to watch diet. Does no dedicated exercise Lab Results  Component Value Date   CHOL 139 09/05/2022   HDL 57 09/05/2022   LDLCALC 64 09/05/2022   TRIG 100 09/05/2022   CHOLHDL 2.4 09/05/2022     2. Diabetes mellitus without complication (HCC) Fasting blood sugars are running around 100-130. Lab Results  Component Value Date   HGBA1C 5.9 (H) 09/05/2022     3. Long-term current use of injectable noninsulin antidiabetic medication Is on ozempic  weekly. She does not check her blood sugars very often  4. Gastroesophageal reflux disease with esophagitis without hemorrhage Takes protonix  daily. Will still have occasional symptoms  5. Cough variant asthma Chronic. Is on symbicort  daily. Denies SOB now. Had covid several months ago and had lots of SOB. Has resolved.  6. Slow transit constipation Is on linzess  which has really helped  7. Recurrent major depressive disorder, in full remission (HCC) Is on celexa  daily and is doing well.    03/13/2023   11:36 AM 01/23/2023    8:08 AM 01/16/2023   10:42 AM  Depression screen PHQ 2/9  Decreased Interest 0 0 0  Down, Depressed, Hopeless 0 0 0  PHQ - 2 Score 0 0 0  Altered sleeping 1 0   Tired, decreased energy 1 0   Change in appetite 0 0   Feeling bad or failure about yourself  0 0   Trouble concentrating 0 0   Moving slowly or fidgety/restless 0 0   Suicidal thoughts 0 0   PHQ-9 Score 2 0       8. GAD (generalized anxiety disorder) Is on ativan  daily    03/13/2023   11:37 AM 01/23/2023    8:09 AM  09/05/2022   11:23 AM 07/11/2022    9:21 AM  GAD 7 : Generalized Anxiety Score  Nervous, Anxious, on Edge 0 1 0 0  Control/stop worrying 0 0 0 0  Worry too much - different things 0 0 0 0  Trouble relaxing 0 0 0 0  Restless 0 0 0 0  Easily annoyed or irritable 0 1 0 2  Afraid - awful might happen 0 0 0 0  Total GAD 7 Score 0 2 0 2  Anxiety Difficulty Not difficult at all Somewhat difficult Not difficult at all Not difficult at all        9. Primary insomnia Sleeps well if she waits and takes her xanax at night  10. BMI 33.0-33.9,adult Weight is down 8lbs  Wt Readings from Last 3 Encounters:  03/13/23 162 lb (73.5 kg)  01/23/23 170 lb (77.1 kg)  01/16/23 170 lb (77.1 kg)   BMI Readings from Last 3 Encounters:  03/13/23 26.15 kg/m  01/23/23 27.44 kg/m  01/16/23 27.44 kg/m       New complaints: None today  Allergies  Allergen Reactions   Sulfa Antibiotics Anaphylaxis   Sulfamethoxazole-Trimethoprim Swelling   Elemental Sulfur Other (See Comments)    Other reaction(s): Angioedema (ALLERGY/intolerance) Other reaction(s): Angioedema (  ALLERGY/intolerance)   Outpatient Encounter Medications as of 03/13/2023  Medication Sig   albuterol  (VENTOLIN  HFA) 108 (90 Base) MCG/ACT inhaler 2 puff q6 prn   budesonide -formoterol  (SYMBICORT ) 80-4.5 MCG/ACT inhaler Take 2 puffs first thing in am and then another 2 puffs about 12 hours later.   diltiazem  (CARDIZEM  CD) 120 MG 24 hr capsule Take 1 capsule (120 mg total) by mouth daily.   doxycycline  (VIBRAMYCIN ) 100 MG capsule Take 100 mg by mouth daily.   dutasteride (AVODART) 0.5 MG capsule Take 0.5 mg by mouth daily.   escitalopram  (LEXAPRO ) 20 MG tablet TAKE 1 TABLET BY MOUTH EVERY DAY   Evolocumab  (REPATHA  SURECLICK) 140 MG/ML SOAJ INJECT 140 MG INTO THE SKIN EVERY 14 (FOURTEEN) DAYS.   famotidine  (PEPCID ) 20 MG tablet TAKE 1 TABLET BY MOUTH TWICE A DAY   fluticasone  (FLONASE ) 50 MCG/ACT nasal spray Place 2 sprays into both  nostrils daily.   hydrocortisone  (ANUSOL -HC) 25 MG suppository PLACE 1 SUPPOSITORY RECTALLY 2 TIMES DAILY.   hyoscyamine  (LEVSIN /SL) 0.125 MG SL tablet Take 1 tablet (0.125 mg total) by mouth every 6 (six) hours as needed.   linaclotide  (LINZESS ) 290 MCG CAPS capsule Take 1 capsule (290 mcg total) by mouth daily before breakfast.   LORATADINE -D 24HR 10-240 MG 24 hr tablet Take 1 tablet by mouth once daily (Patient not taking: Reported on 01/23/2023)   LORazepam  (ATIVAN ) 1 MG tablet Take 1 tablet (1 mg total) by mouth every 8 (eight) hours as needed.   melatonin 5 MG TABS Take 5 mg by mouth at bedtime.   pantoprazole  (PROTONIX ) 20 MG tablet Take 1 tablet (20 mg total) by mouth 2 (two) times daily.   rosuvastatin  (CRESTOR ) 40 MG tablet Take 1 tablet (40 mg total) by mouth daily.   Semaglutide ,0.25 or 0.5MG /DOS, 2 MG/3ML SOPN Inject 0.5 mg into the skin once a week.   VITAMIN D , CHOLECALCIFEROL, PO Take 1 tablet by mouth daily.    No facility-administered encounter medications on file as of 03/13/2023.    Past Surgical History:  Procedure Laterality Date   33 HOUR PH STUDY N/A 12/27/2022   Procedure: 24 HOUR PH STUDY;  Surgeon: Charlanne Groom, MD;  Location: [facility];  Service: Gastroenterology;  Laterality: N/A;  with impectence   ANKLE FRACTURE SURGERY Left    Pins and screws   BREAST LUMPECTOMY Bilateral    left x 1, right x 2   CARPAL TUNNEL RELEASE Right    CARPAL TUNNEL RELEASE Left    COLONOSCOPY     ESOPHAGEAL MANOMETRY N/A 12/27/2022   Procedure: ESOPHAGEAL MANOMETRY (EM);  Surgeon: Charlanne Groom, MD;  Location: [facility];  Service: Gastroenterology;  Laterality: N/A;   KNEE ARTHROSCOPY Bilateral    PARTIAL HYSTERECTOMY      Family History  Problem Relation Age of Onset   Heart disease Mother    Diabetes Mother    Colon polyps Mother    Colon cancer Father 74   Gallbladder disease Daughter    Gallstones Paternal Grandmother    Esophageal cancer Neg Hx    Stomach  cancer Neg Hx    Rectal cancer Neg Hx    Breast cancer Neg Hx       Controlled substance contract: 03/08/22     Review of Systems  Constitutional:  Negative for diaphoresis.  Eyes:  Negative for pain.  Respiratory:  Negative for shortness of breath.   Cardiovascular:  Negative for chest pain, palpitations and leg swelling.  Gastrointestinal:  Negative for abdominal  pain.  Endocrine: Negative for polydipsia.  Skin:  Negative for rash.  Neurological:  Negative for dizziness, weakness and headaches.  Hematological:  Does not bruise/bleed easily.  All other systems reviewed and are negative.      Objective:   Physical Exam Vitals and nursing note reviewed.  Constitutional:      General: She is not in acute distress.    Appearance: Normal appearance. She is well-developed.  HENT:     Head: Normocephalic.     Right Ear: Tympanic membrane normal.     Left Ear: Tympanic membrane normal.     Nose: Nose normal.     Mouth/Throat:     Mouth: Mucous membranes are moist.     Comments: White film on tongue Eyes:     Pupils: Pupils are equal, round, and reactive to light.  Neck:     Vascular: No carotid bruit or JVD.  Cardiovascular:     Rate and Rhythm: Normal rate and regular rhythm.     Heart sounds: Murmur (2/6) heard.  Pulmonary:     Effort: Pulmonary effort is normal. No respiratory distress.     Breath sounds: Normal breath sounds. No wheezing or rales.  Chest:     Chest wall: No tenderness.  Abdominal:     General: Bowel sounds are normal. There is no distension or abdominal bruit.     Palpations: Abdomen is soft. There is no hepatomegaly, splenomegaly, mass or pulsatile mass.     Tenderness: There is no abdominal tenderness.  Musculoskeletal:        General: Normal range of motion.     Cervical back: Normal range of motion and neck supple.  Lymphadenopathy:     Cervical: No cervical adenopathy.  Skin:    General: Skin is warm and dry.  Neurological:     Mental  Status: She is alert and oriented to person, place, and time.     Deep Tendon Reflexes: Reflexes are normal and symmetric.  Psychiatric:        Behavior: Behavior normal.        Thought Content: Thought content normal.        Judgment: Judgment normal.   BP 115/73   Pulse 81   Temp 97.6 F (36.4 C) (Temporal)   Resp 20   Ht 5' 6 (1.676 m)   Wt 162 lb (73.5 kg)   SpO2 98%   BMI 26.15 kg/m    HGBA1c 5.3      Assessment & Plan:   REIGAN TOLLIVER comes in today with chief complaint of No chief complaint on file.   Diagnosis and orders addressed:  1. Mixed hyperlipidemia Low fat diet - Lipid panel - rosuvastatin  (CRESTOR ) 40 MG tablet; Take 1 tablet (40 mg total) by mouth daily.  Dispense: 90 tablet; Refill: 1  2. Diabetes mellitus without complication (HCC) Continue to watch carbs in diet Will attempt to get approval for ozempic  since insurance has changed - [employer] DCA Hb A1c Waived - CBC with Differential/Platelet - CMP14+EGFR - Microalbumin / creatinine urine ratio - Semaglutide ,0.25 or 0.5MG /DOS, 2 MG/3ML SOPN; Inject 0.5 mg into the skin once a week.  Dispense: 3 mL; Refill: 2  3. Long-term current use of injectable noninsulin antidiabetic medication  4. Gastroesophageal reflux disease with esophagitis without hemorrhage Avoid spicy foods Do not eat 2 hours prior to bedtime - famotidine  (PEPCID ) 20 MG tablet; Take 1 tablet (20 mg total) by mouth 2 (two) times daily.  Dispense: 180 tablet;  Refill: 1 - lansoprazole  (PREVACID ) 30 MG capsule; Take 1 capsule (30 mg total) by mouth 2 (two) times daily before a meal.  Dispense: 60 capsule; Refill: 2 - dexlansoprazole  (DEXILANT ) 60 MG capsule; Take 1 capsule (60 mg total) by mouth daily.  Dispense: 90 capsule; Refill: 1  5. Cough variant asthma - budesonide -formoterol  (SYMBICORT ) 80-4.5 MCG/ACT inhaler; Take 2 puffs first thing in am and then another 2 puffs about 12 hours later.  Dispense: 1 each; Refill: 12  6. Slow  transit constipation Continue linzess  as prescribed  7. Recurrent major depressive disorder, in full remission (HCC) Stress management - citalopram  (CELEXA ) 40 MG tablet; Take 1 tablet (40 mg total) by mouth daily.  Dispense: 90 tablet; Refill: 1  8. GAD (generalized anxiety disorder) - LORazepam  (ATIVAN ) 1 MG tablet; Take 1 tablet (1 mg total) by mouth every 8 (eight) hours as needed.  Dispense: 30 tablet; Refill: 5  9. Primary insomnia Bedtime routine  10. BMI 33.0-33.9,adult Discussed diet and exercise for person with BMI >25 Will recheck weight in 3-6 months    Labs pending Health Maintenance reviewed Diet and exercise encouraged  Follow up plan: 6 month   Mary-Margaret Gladis, FNP

## 2023-03-14 ENCOUNTER — Telehealth: Payer: Self-pay

## 2023-03-14 DIAGNOSIS — Z780 Asymptomatic menopausal state: Secondary | ICD-10-CM | POA: Diagnosis not present

## 2023-03-14 DIAGNOSIS — M810 Age-related osteoporosis without current pathological fracture: Secondary | ICD-10-CM | POA: Diagnosis not present

## 2023-03-14 LAB — CMP14+EGFR
ALT: 17 [IU]/L (ref 0–32)
AST: 20 [IU]/L (ref 0–40)
Albumin: 4.3 g/dL (ref 3.9–4.9)
Alkaline Phosphatase: 91 [IU]/L (ref 44–121)
BUN/Creatinine Ratio: 16 (ref 12–28)
BUN: 17 mg/dL (ref 8–27)
Bilirubin Total: 0.5 mg/dL (ref 0.0–1.2)
CO2: 24 mmol/L (ref 20–29)
Calcium: 9.7 mg/dL (ref 8.7–10.3)
Chloride: 104 mmol/L (ref 96–106)
Creatinine, Ser: 1.08 mg/dL — ABNORMAL HIGH (ref 0.57–1.00)
Globulin, Total: 2.3 g/dL (ref 1.5–4.5)
Glucose: 110 mg/dL — ABNORMAL HIGH (ref 70–99)
Potassium: 4.6 mmol/L (ref 3.5–5.2)
Sodium: 142 mmol/L (ref 134–144)
Total Protein: 6.6 g/dL (ref 6.0–8.5)
eGFR: 57 mL/min/{1.73_m2} — ABNORMAL LOW (ref >59)

## 2023-03-14 LAB — CBC WITH DIFFERENTIAL/PLATELET
Basophils Absolute: 0.1 10*3/uL (ref 0.0–0.2)
Basos: 1 %
EOS (ABSOLUTE): 0.1 10*3/uL (ref 0.0–0.4)
Eos: 1 %
Hematocrit: 41.8 % (ref 34.0–46.6)
Hemoglobin: 13.7 g/dL (ref 11.1–15.9)
Immature Grans (Abs): 0 10*3/uL (ref 0.0–0.1)
Immature Granulocytes: 0 %
Lymphocytes Absolute: 1.7 10*3/uL (ref 0.7–3.1)
Lymphs: 28 %
MCH: 29.7 pg (ref 26.6–33.0)
MCHC: 32.8 g/dL (ref 31.5–35.7)
MCV: 91 fL (ref 79–97)
Monocytes Absolute: 0.4 10*3/uL (ref 0.1–0.9)
Monocytes: 6 %
Neutrophils Absolute: 3.9 10*3/uL (ref 1.4–7.0)
Neutrophils: 64 %
Platelets: 247 10*3/uL (ref 150–450)
RBC: 4.62 x10E6/uL (ref 3.77–5.28)
RDW: 12.9 % (ref 11.7–15.4)
WBC: 6.1 10*3/uL (ref 3.4–10.8)

## 2023-03-14 LAB — MICROALBUMIN / CREATININE URINE RATIO
Creatinine, Urine: 256.5 mg/dL
Microalb/Creat Ratio: 17 mg/g{creat} (ref 0–29)
Microalbumin, Urine: 43.9 ug/mL

## 2023-03-14 LAB — LIPID PANEL
Chol/HDL Ratio: 2.9 {ratio} (ref 0.0–4.4)
Cholesterol, Total: 165 mg/dL (ref 100–199)
HDL: 57 mg/dL (ref >39)
LDL Chol Calc (NIH): 91 mg/dL (ref 0–99)
Triglycerides: 95 mg/dL (ref 0–149)
VLDL Cholesterol Cal: 17 mg/dL (ref 5–40)

## 2023-03-14 NOTE — Telephone Encounter (Signed)
 Copied from CRM [id number]. Topic: Clinical - Lab/Test Results >> Mar 14, 2023  2:01 PM Shelah Lewandowsky wrote: Reason for CRM: Patient concerned about kidney level on lab  results, please call patient [phone]

## 2023-03-15 NOTE — Telephone Encounter (Signed)
 Please review labs.

## 2023-03-20 ENCOUNTER — Other Ambulatory Visit: Payer: Self-pay

## 2023-03-20 DIAGNOSIS — M810 Age-related osteoporosis without current pathological fracture: Secondary | ICD-10-CM

## 2023-03-21 ENCOUNTER — Telehealth: Payer: Self-pay

## 2023-03-21 NOTE — Progress Notes (Signed)
 Care Guide Pharmacy Note  03/21/2023 Name: Crystal Wong MRN: [id number] DOB: [birth year]  Referred By: Delfina Feller, FNP Reason for referral: Care Coordination (Outreach to schedule with Pharm d )   Crystal Wong is a 68 y.o. year old female who is a primary care patient of Delfina Feller, FNP.  [name] was referred to the pharmacist for assistance related to:  osteoporosis   Successful contact was made with the patient to discuss pharmacy services including being ready for the pharmacist to call at least 5 minutes before the scheduled appointment time and to have medication bottles and any blood pressure readings ready for review. The patient agreed to meet with the pharmacist via telephone visit on (date/time).04/19/2023  Lenton Rail , RMA     [place]  [facility] Guide  Direct Dial: [phone]  Website: [email or web address]

## 2023-03-25 IMAGING — DX DG CHEST 2V
3 series · 3 of 3 positions shown · non-contrast
Comparison: Chest radiograph 05/03/2021

CLINICAL DATA: Cough

EXAM:
CHEST - 2 VIEW

[chest pa (1 of 2)]
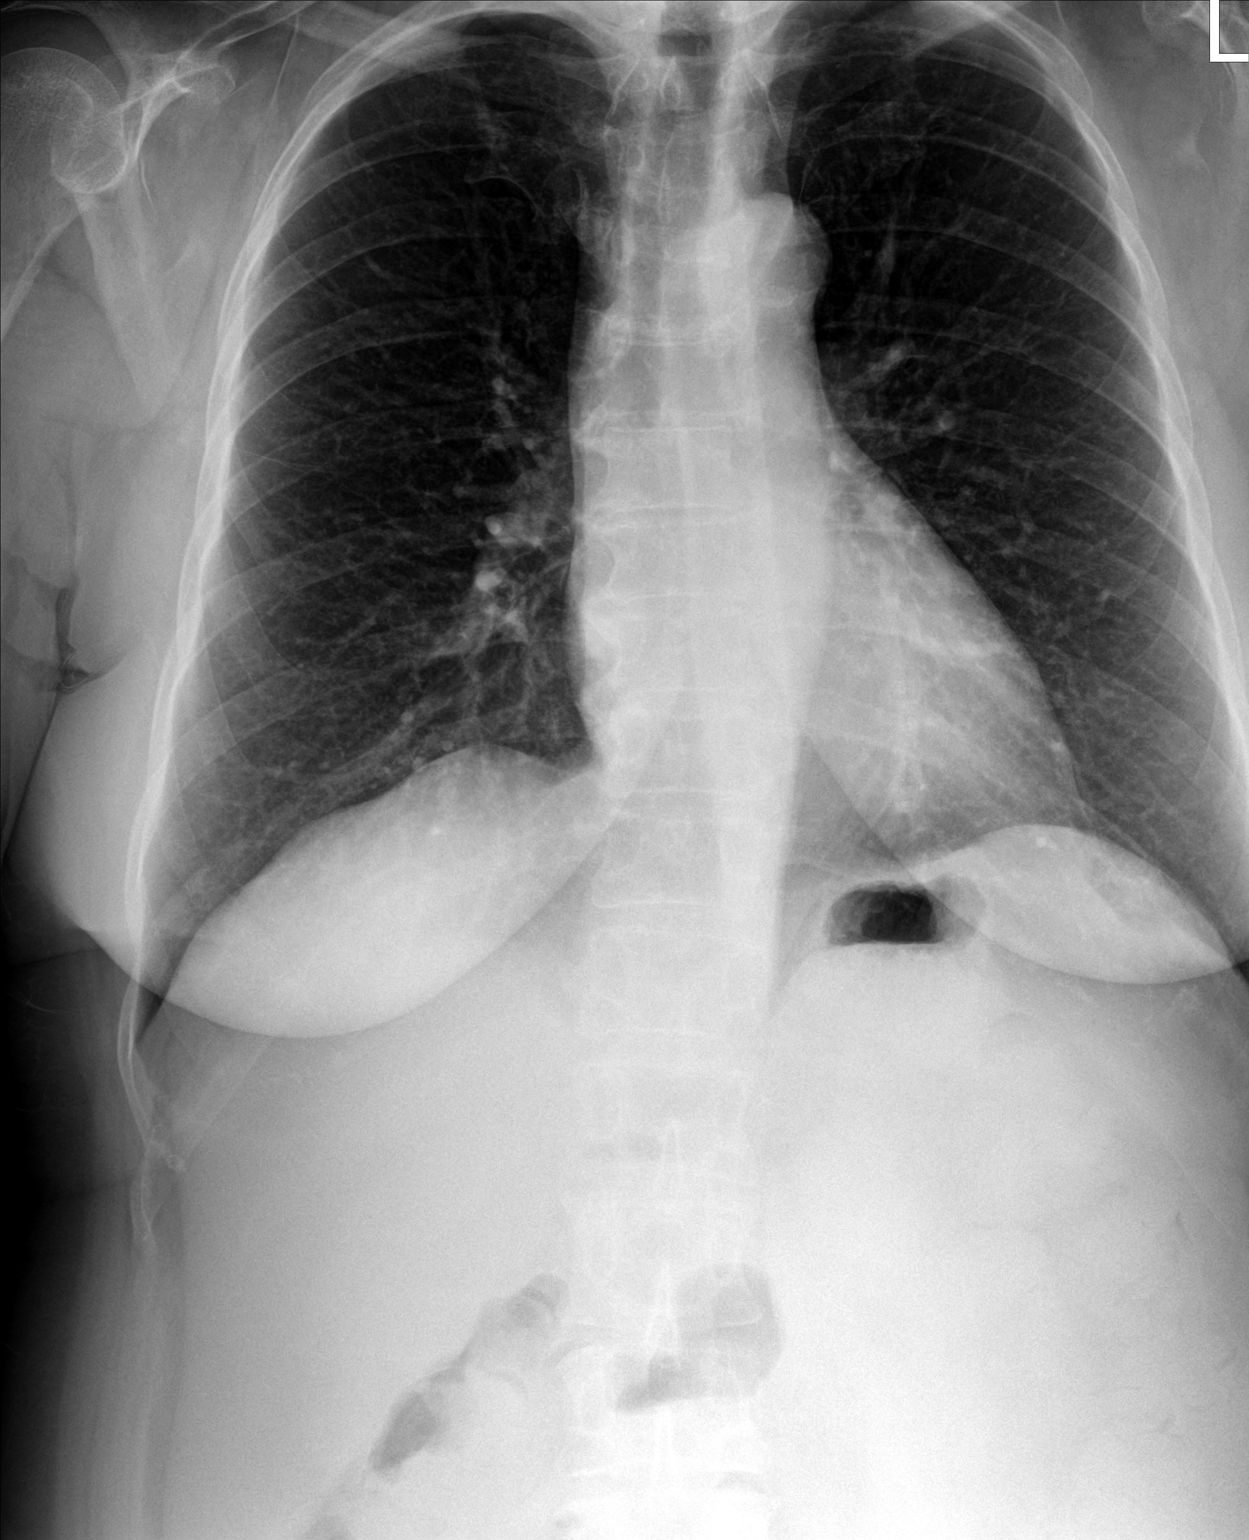

[chest lat]
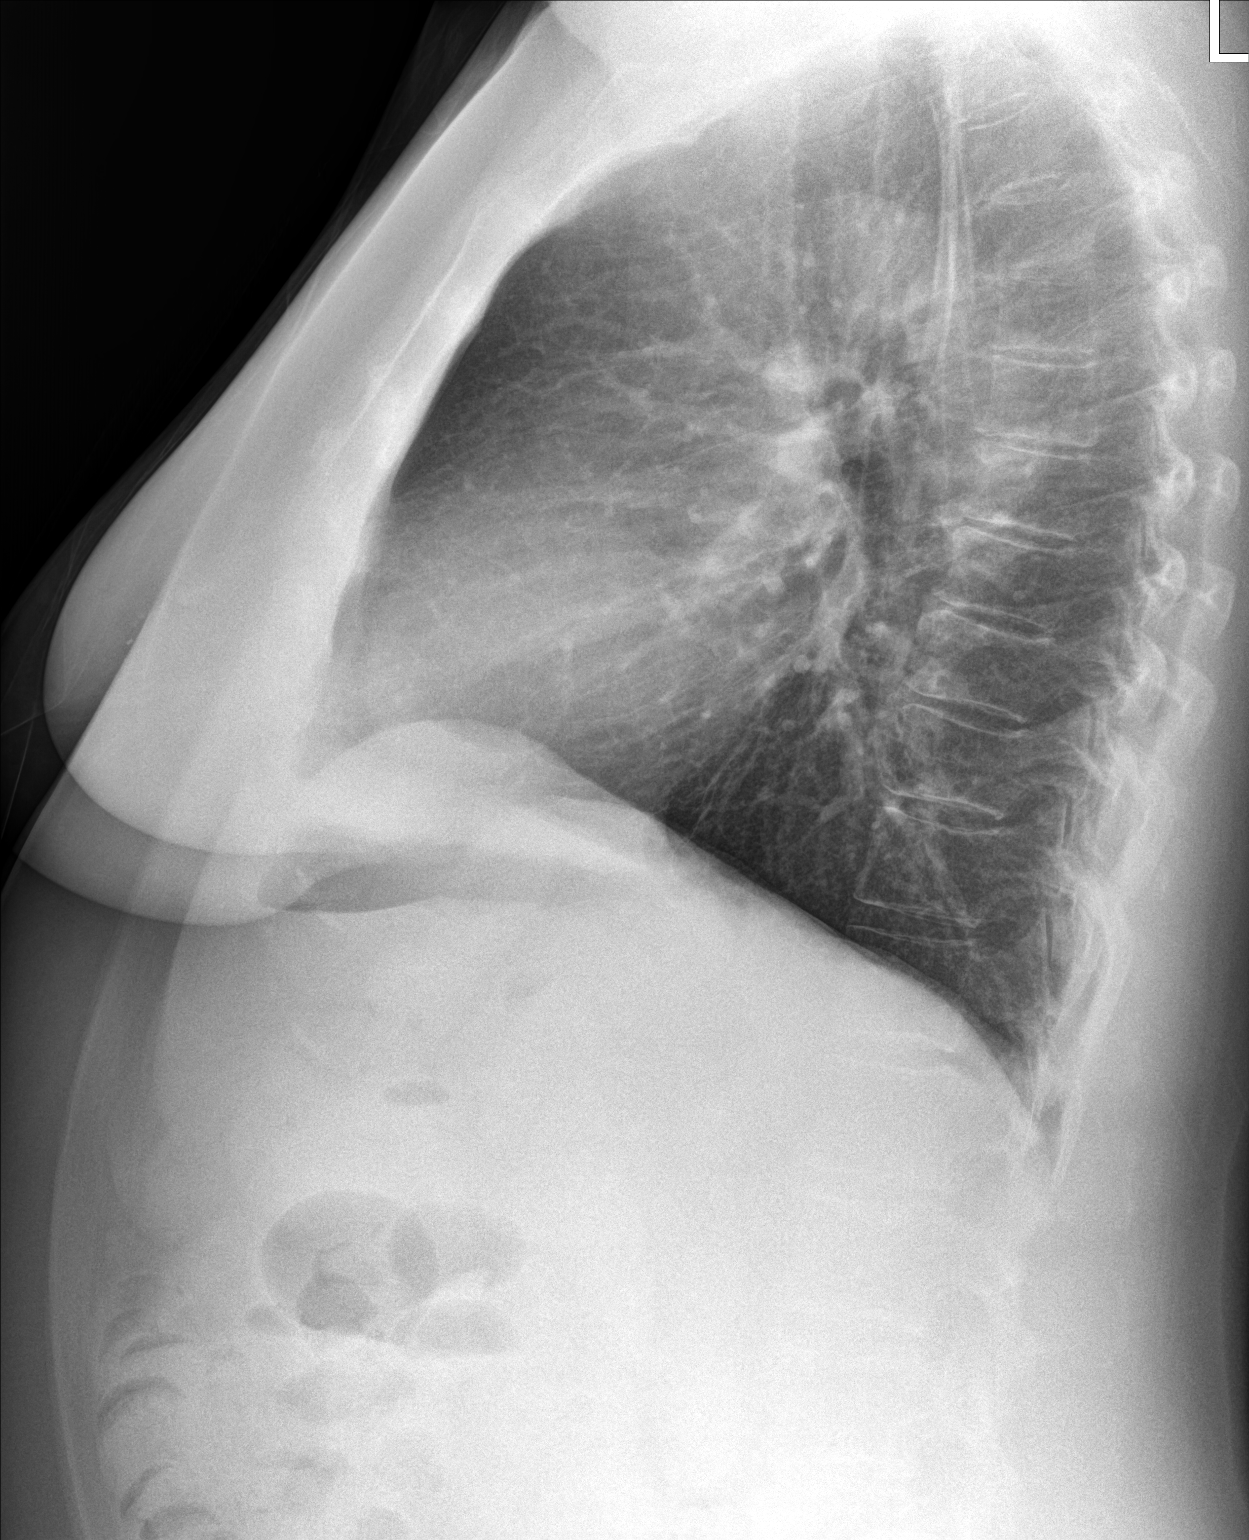

[chest pa (2 of 2)]
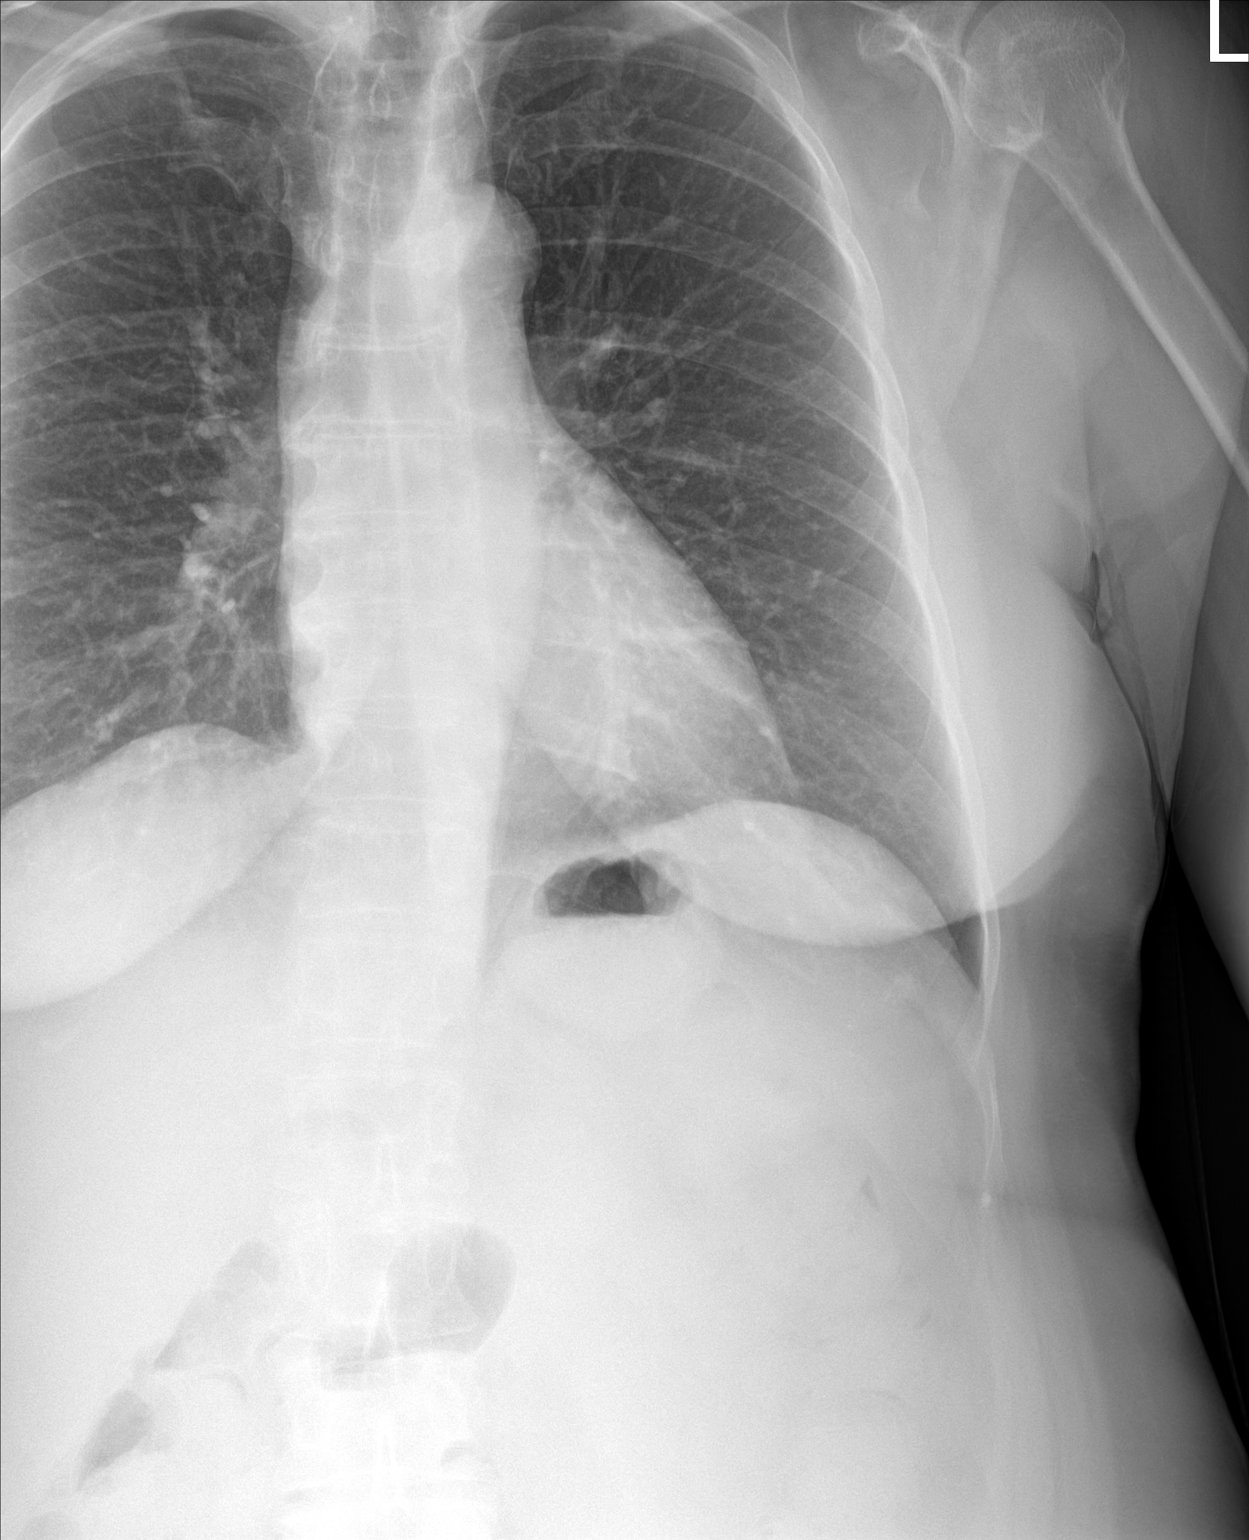

[3 of 3 positions shown; findings below may reference images not displayed]

FINDINGS: The heart size and mediastinal contours are within normal limits.
Both lungs are clear. The visualized skeletal structures are
unremarkable.
IMPRESSION: No active cardiopulmonary disease.

## 2023-04-10 DIAGNOSIS — L661 Lichen planopilaris, unspecified: Secondary | ICD-10-CM | POA: Diagnosis not present

## 2023-04-10 DIAGNOSIS — L299 Pruritus, unspecified: Secondary | ICD-10-CM | POA: Diagnosis not present

## 2023-04-10 DIAGNOSIS — L649 Androgenic alopecia, unspecified: Secondary | ICD-10-CM | POA: Diagnosis not present

## 2023-04-13 ENCOUNTER — Telehealth: Payer: Self-pay

## 2023-04-13 DIAGNOSIS — E119 Type 2 diabetes mellitus without complications: Secondary | ICD-10-CM

## 2023-04-13 MED ORDER — SEMAGLUTIDE (1 MG/DOSE) 4 MG/3ML ~~LOC~~ SOPN
1.0000 mg | PEN_INJECTOR | SUBCUTANEOUS | 3 refills | Status: DC
Start: 2023-04-13 — End: 2023-07-23

## 2023-04-13 NOTE — Addendum Note (Signed)
 Addended by: Brylon Brenning, MARY-MARGARET on: 04/13/2023 04:04 PM   Modules accepted: Orders

## 2023-04-13 NOTE — Telephone Encounter (Signed)
 Copied from CRM [id number]. Topic: Clinical - Medication Question >> Apr 12, 2023  4:33 PM Antwanette L wrote: Reason for CRM: Patient would like for someone to call her because she wants to know if the dosage for Ozempic  should be increased? Patient can be reached at  [phone]

## 2023-04-19 ENCOUNTER — Other Ambulatory Visit (INDEPENDENT_AMBULATORY_CARE_PROVIDER_SITE_OTHER): Payer: Medicare Other

## 2023-04-19 DIAGNOSIS — M818 Other osteoporosis without current pathological fracture: Secondary | ICD-10-CM

## 2023-04-19 NOTE — Progress Notes (Signed)
 04/19/2023 Name: Crystal Wong MRN: [id number] DOB: 1955/12/29  Chief Complaint  Patient presents with   Diabetes    Crystal Wong is a 68 y.o. year old female who presented for a telephone visit.   They were referred to the pharmacist by their PCP for assistance in managing diabetes and osteoporosis .    Subjective:  Care Team: Primary Care Provider: Bennie Pierini, FNP ; Next Scheduled Visit: 09/11/23   Medication Access/Adherence  Current Pharmacy:  [facility] [phone] - [place], [place] - [address] [place] [address] Phone: [phone] Fax: [phone]  [facility] [address], [place] - [address] [place] [address] Phone: [phone] Fax: [phone]  [facility] [phone] - [place], [place] - [place] [address] [facility] [address] [place] [address] Phone: [phone] Fax: [phone]  Patient reports affordability concerns with their medications: No -Ozempic $46/month Patient reports access/transportation concerns to their pharmacy: No  Patient reports adherence concerns with their medications:  No    Diabetes:  Current medications:  Ozempic (moving to 1mg  next week) Continue on current meds; doing well   HPI: Does pt already have a diagnosis of:  Osteoporosis?  Yes--DX 03/13/23  Back Pain?  No       Kyphosis?  No Prior fracture?  Yes ankle fracture s/p 15 yrs due to accident Med(s) for Osteoporosis/Osteopenia:  n/a; reports states patient was on previous bone building therapy Med(s) previously tried for Osteoporosis/Osteopenia:  n/a                                                             PMH: Hysterectomy?  Yes-53yrs ago HRT? Yes - Former.  Type/duration: estrogen for 1 year post hysterectomy Steroid Use?  No Thyroid med?  No History of cancer?  No History of digestive disorders (ie Crohn's)?  Yes--GERD Current or previous  eating disorders?  No Last Vitamin D Result:  45.1 (1/2 Last GFR Result:  57 (03/13/23)   FH/SH: Family history of osteoporosis?  Yes mother Parent with history of hip fracture?  No Family history of breast cancer?  No Exercise?  Yes patient exercises weekly  Smoking?  No Alcohol?  No     DEXA scan 03/13/23 COMPARISON:  None.   FINDINGS: Scan quality: Good.   LUMBAR SPINE (L1-L4):   BMD (in g/cm2): 0.881   T-score: -2.5   Z-score: -1.2   LEFT FEMORAL NECK:   BMD (in g/cm2): 0.740   T-score: -2.1   Z-score: -0.8   LEFT TOTAL HIP:   BMD (in g/cm2): 0.793   T-score: -1.7   Z-score: -0.7   RIGHT FEMORAL NECK:   BMD (in g/cm2): 0.804   T-score: -1.7   Z-score: -0.4   RIGHT TOTAL HIP:   BMD (in g/cm2): 0.844   T-score: -1.3   Z-score: -0.3   FRAX 10-YEAR PROBABILITY OF FRACTURE:   FRAX not reported as the lowest BMD is not in the osteopenia range.   IMPRESSION: Osteoporosis based on BMD.  Recommendations: 1.  Patient is highly motivated by diet/lifestyle; will hold on starting addition medication therapy at this time; could consider bisphosphonate in the future if needed; patient is not  a high fall risk 2.  continue calcium 1200mg  daily through supplementation or diet.  3.  continue weight bearing exercise - 30 minutes at least 4 days per week.   4.  Counseled and educated about fall risk and prevention.  Doing well on current medications; reviewed purpose and side effects  Recheck DEXA:  2 years   Kieth Brightly, PharmD, BCACP, CPP Clinical Pharmacist, [facility], [facility]

## 2023-04-24 ENCOUNTER — Telehealth (INDEPENDENT_AMBULATORY_CARE_PROVIDER_SITE_OTHER): Payer: Medicare Other | Admitting: Family

## 2023-04-24 ENCOUNTER — Encounter: Payer: Self-pay | Admitting: Family

## 2023-04-24 ENCOUNTER — Telehealth: Payer: Self-pay | Admitting: Family Medicine

## 2023-04-24 ENCOUNTER — Telehealth: Payer: Self-pay

## 2023-04-24 DIAGNOSIS — U071 COVID-19: Secondary | ICD-10-CM

## 2023-04-24 MED ORDER — NIRMATRELVIR/RITONAVIR (PAXLOVID) TABLET (RENAL DOSING): 2.0000 | ORAL_TABLET | Freq: Two times a day (BID) | ORAL | 0 refills | Status: AC

## 2023-04-24 NOTE — Telephone Encounter (Signed)
Pt already scheduled for video visit today

## 2023-04-24 NOTE — Progress Notes (Signed)
Virtual Visit Consent   Crystal Wong, you are scheduled for a virtual visit with a [facility] provider today. Just as with appointments in the office, your consent must be obtained to participate. Your consent will be active for this visit and any virtual visit you may have with one of our providers in the next 365 days. If you have a MyChart account, a copy of this consent can be sent to you electronically.  As this is a virtual visit, video technology does not allow for your provider to perform a traditional examination. This may limit your provider's ability to fully assess your condition. If your provider identifies any concerns that need to be evaluated in person or the need to arrange testing (such as labs, EKG, etc.), we will make arrangements to do so. Although advances in technology are sophisticated, we cannot ensure that it will always work on either your end or our end. If the connection with a video visit is poor, the visit may have to be switched to a telephone visit. With either a video or telephone visit, we are not always able to ensure that we have a secure connection.  By engaging in this virtual visit, you consent to the provision of healthcare and authorize for your insurance to be billed (if applicable) for the services provided during this visit. Depending on your insurance coverage, you may receive a charge related to this service.  I need to obtain your verbal consent now. Are you willing to proceed with your visit today? Crystal Wong has provided verbal consent on 04/24/2023 for a virtual visit (video or telephone). Jannifer Rodney, FNP  Date: 04/24/2023 10:36 AM   Virtual Visit via Video Note   I, Jannifer Rodney, connected with  Crystal Wong  ([id number], Apr 30, 1955) on 04/24/23 at 10:40 AM EST by a video-enabled telemedicine application and verified that I am speaking with the correct person using two identifiers.  Location: Patient: Virtual Visit Location Patient:  Home Provider: Virtual Visit Location Provider: Home Office   I discussed the limitations of evaluation and management by telemedicine and the availability of in person appointments. The patient expressed understanding and agreed to proceed.    History of Present Illness: Crystal Wong is a 68 y.o. who identifies as a female who was assigned female at birth, and is being seen today for COVID. Pt  states her husband tested positive this morning and she is having right ear pain.   HPI: URI  This is a new problem. The current episode started today. The problem has been unchanged. Pertinent negatives include no chest pain, headaches or joint pain. She has tried nothing for the symptoms. The treatment provided no relief.    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   Chronic cough 10/25/2022   Constipation 08/07/2022   Diabetes mellitus treated with injections of non-insulin medication (HCC) 07/11/2022   Cough variant asthma 04/24/2016   BMI 33.0-33.9,adult 03/02/2016   Mixed hyperlipidemia 08/23/2012   Insomnia 08/23/2012   GERD (gastroesophageal reflux disease) 08/23/2012   Depression 08/23/2012   GAD (generalized anxiety disorder) 08/23/2012    Allergies:  Allergies  Allergen Reactions   Sulfa Antibiotics Anaphylaxis   Sulfamethoxazole-Trimethoprim Swelling   Elemental Sulfur Other (See Comments)    Other reaction(s): Angioedema (ALLERGY/intolerance) Other reaction(s): Angioedema (ALLERGY/intolerance)   Medications:  Current Outpatient Medications:    nirmatrelvir/ritonavir, renal dosing, (PAXLOVID) 10 x 150 MG & 10 x  100MG  TABS, Take 2 tablets by mouth 2 (two) times daily for 5 days. (Take nirmatrelvir 150 mg one tablet twice daily for 5 days and ritonavir 100 mg one tablet twice daily for 5 days) Patient GFR is 58, Disp: 20 tablet, Rfl: 0   albuterol (VENTOLIN HFA) 108 (90 Base) MCG/ACT inhaler, 2 puff q6 prn, Disp:  20.1 g, Rfl: 2   budesonide-formoterol (SYMBICORT) 80-4.5 MCG/ACT inhaler, Take 2 puffs first thing in am and then another 2 puffs about 12 hours later., Disp: 1 each, Rfl: 12   doxycycline (VIBRAMYCIN) 100 MG capsule, Take 100 mg by mouth daily., Disp: , Rfl:    dutasteride (AVODART) 0.5 MG capsule, Take 0.5 mg by mouth daily., Disp: , Rfl:    escitalopram (LEXAPRO) 20 MG tablet, Take 1 tablet (20 mg total) by mouth daily., Disp: 90 tablet, Rfl: 1   Evolocumab (REPATHA SURECLICK) 140 MG/ML SOAJ, INJECT 140 MG INTO THE SKIN EVERY 14 (FOURTEEN) DAYS., Disp: 6 mL, Rfl: 3   famotidine (PEPCID) 20 MG tablet, Take 1 tablet (20 mg total) by mouth 2 (two) times daily., Disp: 180 tablet, Rfl: 1   fluticasone (FLONASE) 50 MCG/ACT nasal spray, Place 2 sprays into both nostrils daily., Disp: 16 g, Rfl: 6   hydrocortisone (ANUSOL-HC) 25 MG suppository, PLACE 1 SUPPOSITORY RECTALLY 2 TIMES DAILY., Disp: 12 suppository, Rfl: 0   hyoscyamine (LEVSIN/SL) 0.125 MG SL tablet, Take 1 tablet (0.125 mg total) by mouth every 6 (six) hours as needed., Disp: 120 tablet, Rfl: 4   linaclotide (LINZESS) 290 MCG CAPS capsule, Take 1 capsule (290 mcg total) by mouth daily before breakfast., Disp: 30 capsule, Rfl: 5   LORATADINE-D 24HR 10-240 MG 24 hr tablet, Take 1 tablet by mouth once daily, Disp: 90 tablet, Rfl: 1   LORazepam (ATIVAN) 1 MG tablet, Take 1 tablet (1 mg total) by mouth every 8 (eight) hours as needed., Disp: 30 tablet, Rfl: 1   melatonin 5 MG TABS, Take 5 mg by mouth at bedtime., Disp: , Rfl:    pantoprazole (PROTONIX) 20 MG tablet, Take 1 tablet (20 mg total) by mouth 2 (two) times daily., Disp: 180 tablet, Rfl: 1   rosuvastatin (CRESTOR) 40 MG tablet, Take 1 tablet (40 mg total) by mouth daily., Disp: 90 tablet, Rfl: 1   Semaglutide, 1 MG/DOSE, 4 MG/3ML SOPN, Inject 1 mg as directed once a week., Disp: 3 mL, Rfl: 3   VITAMIN D, CHOLECALCIFEROL, PO, Take 1 tablet by mouth daily. , Disp: , Rfl:    Observations/Objective: Patient is well-developed, well-nourished in no acute distress.  Resting comfortably  at home.  Head is normocephalic, atraumatic.  No labored breathing.  Speech is clear and coherent with logical content.  Patient is alert and oriented at baseline.    Assessment and Plan: 1. COVID-19 (Primary) - nirmatrelvir/ritonavir, renal dosing, (PAXLOVID) 10 x 150 MG & 10 x 100MG  TABS; Take 2 tablets by mouth 2 (two) times daily for 5 days. (Take nirmatrelvir 150 mg one tablet twice daily for 5 days and ritonavir 100 mg one tablet twice daily for 5 days) Patient GFR is 58  Dispense: 20 tablet; Refill: 0  Given pt current symptoms may not need antivirals. Will send in rx. If symptoms worsen can start.  COVID positive, rest, force fluids, tylenol as needed, eport any worsening symptoms such as increased shortness of breath, swelling, or continued high fevers. Possible adverse effects discussed with antivirals.    Follow Up Instructions: I discussed the assessment  and treatment plan with the patient. The patient was provided an opportunity to ask questions and all were answered. The patient agreed with the plan and demonstrated an understanding of the instructions.  A copy of instructions were sent to the patient via MyChart unless otherwise noted below.     The patient was advised to call back or seek an in-person evaluation if the symptoms worsen or if the condition fails to improve as anticipated.    Jannifer Rodney, FNP

## 2023-04-24 NOTE — Telephone Encounter (Signed)
Called [facility] to get the radiologist to review and correct - the patient has not been on bone building therapies

## 2023-04-24 NOTE — Telephone Encounter (Signed)
-----   Message from Danella Maiers sent at 04/19/2023 10:31 AM EST ----- Regarding: dexa report --pt states incorrect info Hi!  Patient states she was not on bone building therapy in the past at all, but DEXA report states she has been.  Can you clarify? Maybe a med was checked? She is not on any therapy and denies previous medications for bones.  I didn't know who to reach out to!  Thank you!! Raynelle Fanning

## 2023-04-24 NOTE — Telephone Encounter (Signed)
Copied from CRM [id number]. Topic: Clinical - Medication Question >> Apr 24, 2023  9:34 AM Hector Shade B wrote: Reason for CRM: Patient's spouse called in and requested medication for Covid being that he has test positive he would like to make sure his wife has medication. [phone]

## 2023-05-08 ENCOUNTER — Ambulatory Visit: Payer: Medicare Other | Admitting: Physician Assistant

## 2023-05-13 ENCOUNTER — Other Ambulatory Visit: Payer: Self-pay | Admitting: Gastroenterology

## 2023-05-17 ENCOUNTER — Other Ambulatory Visit: Payer: Self-pay | Admitting: Nurse Practitioner

## 2023-05-17 DIAGNOSIS — M25569 Pain in unspecified knee: Secondary | ICD-10-CM

## 2023-05-17 DIAGNOSIS — Z139 Encounter for screening, unspecified: Secondary | ICD-10-CM

## 2023-05-31 ENCOUNTER — Encounter: Payer: Self-pay | Admitting: Pharmacist

## 2023-05-31 ENCOUNTER — Other Ambulatory Visit

## 2023-06-05 ENCOUNTER — Ambulatory Visit: Payer: Self-pay

## 2023-06-05 ENCOUNTER — Ambulatory Visit (INDEPENDENT_AMBULATORY_CARE_PROVIDER_SITE_OTHER): Admitting: Nurse Practitioner

## 2023-06-05 ENCOUNTER — Encounter: Payer: Self-pay | Admitting: Nurse Practitioner

## 2023-06-05 ENCOUNTER — Ambulatory Visit (INDEPENDENT_AMBULATORY_CARE_PROVIDER_SITE_OTHER)

## 2023-06-05 VITALS — BP 105/68 | HR 86 | Temp 97.7°F | Ht 66.0 in | Wt 159.0 lb

## 2023-06-05 DIAGNOSIS — K5901 Slow transit constipation: Secondary | ICD-10-CM | POA: Diagnosis not present

## 2023-06-05 DIAGNOSIS — R109 Unspecified abdominal pain: Secondary | ICD-10-CM

## 2023-06-05 DIAGNOSIS — I878 Other specified disorders of veins: Secondary | ICD-10-CM | POA: Diagnosis not present

## 2023-06-05 LAB — URINALYSIS, COMPLETE
Bilirubin, UA: NEGATIVE
Glucose, UA: NEGATIVE
Nitrite, UA: NEGATIVE
RBC, UA: NEGATIVE
Specific Gravity, UA: 1.02 (ref 1.005–1.030)
Urobilinogen, Ur: 0.2 mg/dL (ref 0.2–1.0)
pH, UA: 6 (ref 5.0–7.5)

## 2023-06-05 LAB — MICROSCOPIC EXAMINATION
RBC, Urine: NONE SEEN /HPF (ref 0–2)
Renal Epithel, UA: NONE SEEN /HPF
Yeast, UA: NONE SEEN

## 2023-06-05 NOTE — Patient Instructions (Signed)

## 2023-06-05 NOTE — Telephone Encounter (Signed)
  Chief Complaint: back pain Symptoms: dark colored urine (couple of months), nausea, constipation and diarrhea Frequency: x 4 days; started intermittent and has become constant Pertinent Negatives: Patient denies urinary frequency/urgency, blood in urine, vomiting, numbness, weakness, loss of bowel or bladder control, fever, abdominal pain Disposition: [] ED /[] Urgent Care (no appt availability in office) / [x] Appointment(In office/virtual)/ []  [facility]/ [] Home Care/ [] Refused Recommended Disposition /[] [facility]/ []  Follow-up with PCP Additional Notes: Patient states she thought she was having constipation or gas at first. She states the pain moved from the right side of her waist to her back and she thought she had pulled a muscle during pilates. Patient states she was constipated last week so she took a dulcolax and felt some of the pain then, she states she also has had some diarrhea but she states that happens due to taking her Linzess sometimes. Patient states the dark colored urine has been addressed with her doctor and has been ongoing for months. Patient agreeable to acute visit with PCP today.  Copied from CRM [id number]. Topic: Clinical - Red Word Triage >> Jun 05, 2023 10:23 AM Crystal Wong wrote: Red Word that prompted transfer to Nurse Triage: Pain in back right side, nausea Reason for Disposition  [1] MODERATE back pain (e.g., interferes with normal activities) AND [2] present > 3 days  Answer Assessment - Initial Assessment Questions 1. ONSET: "When did the pain begin?"      X 4 days.  2. LOCATION: "Where does it hurt?" (upper, mid or lower back)     She states it started around her right waist first. Mid, right back.  3. SEVERITY: "How bad is the pain?"  (e.g., Scale 1-10; mild, moderate, or severe)   - MILD (1-3): Doesn't interfere with normal activities.    - MODERATE (4-7): Interferes with normal activities or awakens from sleep.    - SEVERE (8-10):  Excruciating pain, unable to do any normal activities.      5/10.  4. PATTERN: "Is the pain constant?" (e.g., yes, no; constant, intermittent)      Started out intermittent and has become constant.  5. RADIATION: "Does the pain shoot into your legs or somewhere else?"     Denies.  6. CAUSE:  "What do you think is causing the back pain?"      Unsure. She states she thought it was related to her constipation (took dulocolax a week ago and felt the pain once then) and then thought it could be a pulled muscle. Patient states she thought it might also be a kidney infection.  7. BACK OVERUSE:  "Any recent lifting of heavy objects, strenuous work or exercise?"     Patient states she does pilates every day so she thought maybe she had pulled a muscle when working on her waist and back.  8. MEDICINES: "What have you taken so far for the pain?" (e.g., nothing, acetaminophen, NSAIDS)     Nothing today. Patient states she has taken Tylenol.  9. NEUROLOGIC SYMPTOMS: "Do you have any weakness, numbness, or problems with bowel/bladder control?"     Denies.  10. OTHER SYMPTOMS: "Do you have any other symptoms?" (e.g., fever, abdomen pain, burning with urination, blood in urine)       Nausea, diarrhea (last episode was 2 days ago)  11. PREGNANCY: "Is there any chance you are pregnant?" "When was your last menstrual period?"       N/A.  Protocols used: Back Pain-A-AH

## 2023-06-05 NOTE — Progress Notes (Addendum)
   Subjective:    Patient ID: Crystal Wong, female    DOB: 06-20-1955, 68 y.o.   MRN: [id number]   Chief Complaint: Pain in right side radiating to back   HPI  Patient in today c/o rigth flank ain that radiates to back. Started 4 days ago. Describes pain as burning pain that radiates to hr back. Rates pain 5/10 today. Has taken tylenol which helps a little. No relation to eating or what she eats.  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  Eyes:  Negative for pain.  Respiratory:  Negative for shortness of breath.   Cardiovascular:  Negative for chest pain, palpitations and leg swelling.  Gastrointestinal:  Negative for abdominal pain.  Endocrine: Negative for polydipsia.  Skin:  Negative for rash.  Neurological:  Negative for dizziness, weakness and headaches.  Hematological:  Does not bruise/bleed easily.  All other systems reviewed and are negative.      Objective:   Physical Exam Constitutional:      Appearance: Normal appearance.  Cardiovascular:     Rate and Rhythm: Normal rate and regular rhythm.     Heart sounds: Normal heart sounds.  Abdominal:     Tenderness: There is abdominal tenderness (RUQ pain on palpation).  Neurological:     General: No focal deficit present.     Mental Status: She is alert and oriented to person, place, and time.  Psychiatric:        Mood and Affect: Mood normal.        Behavior: Behavior normal.    BP 105/68   Pulse 86   Temp  97.7 F (36.5 C) (Temporal)   Ht 5\' 6"  (1.676 m)   Wt 159 lb (72.1 kg)   SpO2 100%   BMI 25.66 kg/m   KUB- large stool burden throughout colon.  Urine clear     Assessment & Plan:   Crystal Wong in today with chief complaint of Pain in right side radiating to back   1. Abdominal pain, unspecified abdominal location (Primary) - DG Abd 1 View - Urinalysis, Complete - Urine Culture  2. Slow transit constipation Continue linzess as prescribed Miralax daily Mag citrate today Force fluids Increase fibr in diet Keep GI appt next week    The above assessment and management plan was discussed with the patient. The patient verbalized understanding of and has agreed to the management plan. Patient is aware to call the clinic if symptoms persist or worsen. Patient is aware when to return to the clinic for a follow-up visit. Patient educated on when it is appropriate to go to the emergency department.   [name], FNP

## 2023-06-06 ENCOUNTER — Telehealth: Payer: Self-pay | Admitting: Gastroenterology

## 2023-06-06 NOTE — Telephone Encounter (Signed)
 PT is calling to find out some options for relief of gerd. Please advise.

## 2023-06-06 NOTE — Telephone Encounter (Signed)
 Pt stated that she has been having issues with abdominal pain, pt stated that she went to her PCP yesterday and they did an xray which showed an increase in stool in her Colon. Pt already takes Mialax daily and Linzess 290 MG  daily.  Pt was provided with instructions for a Miralax Purge.  Pt has Office Visit that was previous scheduled on 06/11/2023 at 2:30 PM with Quentin Mulling PA. Pt is aware. Pt verbalized understanding with all questions answered.

## 2023-06-07 DIAGNOSIS — M1712 Unilateral primary osteoarthritis, left knee: Secondary | ICD-10-CM | POA: Diagnosis not present

## 2023-06-09 LAB — URINE CULTURE

## 2023-06-11 ENCOUNTER — Ambulatory Visit: Admitting: Physician Assistant

## 2023-06-11 ENCOUNTER — Encounter: Payer: Self-pay | Admitting: Physician Assistant

## 2023-06-11 VITALS — BP 90/60 | HR 92 | Ht 64.5 in | Wt 158.1 lb

## 2023-06-11 DIAGNOSIS — Z7985 Long-term (current) use of injectable non-insulin antidiabetic drugs: Secondary | ICD-10-CM

## 2023-06-11 DIAGNOSIS — K5900 Constipation, unspecified: Secondary | ICD-10-CM | POA: Diagnosis not present

## 2023-06-11 DIAGNOSIS — K224 Dyskinesia of esophagus: Secondary | ICD-10-CM

## 2023-06-11 DIAGNOSIS — K2100 Gastro-esophageal reflux disease with esophagitis, without bleeding: Secondary | ICD-10-CM

## 2023-06-11 DIAGNOSIS — K219 Gastro-esophageal reflux disease without esophagitis: Secondary | ICD-10-CM | POA: Diagnosis not present

## 2023-06-11 DIAGNOSIS — E119 Type 2 diabetes mellitus without complications: Secondary | ICD-10-CM | POA: Diagnosis not present

## 2023-06-11 DIAGNOSIS — R351 Nocturia: Secondary | ICD-10-CM

## 2023-06-11 DIAGNOSIS — K649 Unspecified hemorrhoids: Secondary | ICD-10-CM | POA: Diagnosis not present

## 2023-06-11 MED ORDER — HYDROCORTISONE (PERIANAL) 2.5 % EX CREA: 1.0000 | TOPICAL_CREAM | Freq: Two times a day (BID) | CUTANEOUS | 2 refills | Status: AC

## 2023-06-11 MED ORDER — HYDROCORTISONE ACETATE 25 MG RE SUPP: 25.0000 mg | Freq: Two times a day (BID) | RECTAL | 0 refills | Status: AC

## 2023-06-11 NOTE — Patient Instructions (Signed)
 Miralax is an osmotic laxative.  It only brings more water into the stool.  This is safe to take daily.  Can take up to 17 gram of miralax twice a day.  Mix with juice or coffee.  Start 1 capful at night for 3-4 days and reassess your response in 3-4 days.  You can increase and decrease the dose based on your response.  Remember, it can take up to 3-4 days to take effect OR for the effects to wear off.   I often pair this with benefiber in the morning to help assure the stool is not too loose.   Toileting tips to help with your constipation - Drink at least 64-80 ounces of water/liquid per day. - Establish a time to try to move your bowels every day.  For many people, this is after a cup of coffee or after a meal such as breakfast. - Sit all of the way back on the toilet keeping your back fairly straight and while sitting up, try to rest the tops of your forearms on your upper thighs.   - Raising your feet with a step stool/squatty potty can be helpful to improve the angle that allows your stool to pass through the rectum. - Relax the rectum feeling it bulge toward the toilet water.  If you feel your rectum raising toward your body, you are contracting rather than relaxing. - Breathe in and slowly exhale. "Belly breath" by expanding your belly towards your belly button. Keep belly expanded as you gently direct pressure down and back to the anus.  A low pitched GRRR sound can assist with increasing intra-abdominal pressure.  (Can also trying to blow on a pinwheel and make it move, this helps with the same belly breathing) - Repeat 3-4 times. If unsuccessful, contract the pelvic floor to restore normal tone and get off the toilet.  Avoid excessive straining. - To reduce excessive wiping by teaching your anus to normally contract, place hands on outer aspect of knees and resist knee movement outward.  Hold 5-10 second then place hands just inside of knees and resist inward movement of knees.  Hold 5  seconds.  Repeat a few times each way.  Go to the ER if unable to pass gas, severe AB pain, unable to hold down food, any shortness of breath of chest pain.  Gastroparesis Please do small frequent meals like 4-6 meals a day.  Eat and drink liquids at separate times.  Avoid high fiber foods, cook your vegetables, avoid high fat food.  Suggest spreading protein throughout the day (greek yogurt, glucerna, soft meat, milk, eggs) Consider reading "Living well with Gastroparesis" by Reuel Derby Gastroparesis is a condition in which food takes longer than normal to empty from the stomach. This condition is also known as delayed gastric emptying. It is usually a long-term (chronic) condition. There is no cure, but there are treatments and things that you can do at home to help relieve symptoms. Treating the underlying condition that causes gastroparesis can also help relieve symptoms What are the causes? In many cases, the cause of this condition is not known. Possible causes include: A hormone (endocrine) disorder, such as hypothyroidism or diabetes. A nervous system disease, such as Parkinson's disease or multiple sclerosis. Cancer, infection, or surgery that affects the stomach or vagus nerve. The vagus nerve runs from your chest, through your neck, and to the lower part of your brain. A connective tissue disorder, such as scleroderma. Certain medicines. What increases  the risk? You are more likely to develop this condition if: You have certain disorders or diseases. These may include: An endocrine disorder. An eating disorder. Amyloidosis. Scleroderma. Parkinson's disease. Multiple sclerosis. Cancer or infection of the stomach or the vagus nerve. You have had surgery on your stomach or vagus nerve. You take certain medicines. You are female. What are the signs or symptoms? Symptoms of this condition include: Feeling full after eating very little or a loss of appetite. Nausea,  vomiting, or heartburn. Bloating of your abdomen. Inconsistent blood sugar (glucose) levels on blood tests. Unexplained weight loss. Acid from the stomach coming up into the esophagus (gastroesophageal reflux). Sudden tightening (spasm) of the stomach, which can be painful. Symptoms may come and go. Some people may not notice any symptoms. How is this diagnosed? This condition is diagnosed with tests, such as: Tests that check how long it takes food to move through the stomach and intestines. These tests include: Upper gastrointestinal (GI) series. For this test, you drink a liquid that shows up well on X-rays, and then X-rays are taken of your intestines. Gastric emptying scintigraphy. For this test, you eat food that contains a small amount of radioactive material, and then scans are taken. Wireless capsule GI monitoring system. For this test, you swallow a pill (capsule) that records information about how foods and fluid move through your stomach. Gastric manometry. For this test, a tube is passed down your throat and into your stomach to measure electrical and muscular activity. Endoscopy. For this test, a long, thin tube with a camera and light on the end is passed down your throat and into your stomach to check for problems in your stomach lining. Ultrasound. This test uses sound waves to create images of the inside of your body. This can help rule out gallbladder disease or pancreatitis as a cause of your symptoms. How is this treated? There is no cure for this condition, but treatment and home care may relieve symptoms. Treatment may include: Treating the underlying cause. Managing your symptoms by making changes to your diet and exercise habits. Taking medicines to control nausea and vomiting and to stimulate stomach muscles. Getting food through a feeding tube in the hospital. This may be done in severe cases. Having surgery to insert a device called a gastric electrical stimulator  into your body. This device helps improve stomach emptying and control nausea and vomiting. Follow these instructions at home: Take over-the-counter and prescription medicines only as told by your health care provider. Follow instructions from your health care provider about eating or drinking restrictions. Your health care provider may recommend that you: Eat smaller meals more often. Eat low-fat foods. Eat low-fiber forms of high-fiber foods. For example, eat cooked vegetables instead of raw vegetables. Have only liquid foods instead of solid foods. Liquid foods are easier to digest. Drink enough fluid to keep your urine pale yellow. Exercise as often as told by your health care provider. Keep all follow-up visits. This is important. Contact a health care provider if you: Notice that your symptoms do not improve with treatment. Have new symptoms. Get help right away if you: Have severe pain in your abdomen that does not improve with treatment. Have nausea that is severe or does not go away. Vomit every time you drink fluids. Summary Gastroparesis is a long-term (chronic) condition in which food takes longer than normal to empty from the stomach. Symptoms include nausea, vomiting, heartburn, bloating of your abdomen, and loss of appetite. Eating  smaller portions, low-fat foods, and low-fiber forms of high-fiber foods may help you manage your symptoms. Get help right away if you have severe pain in your abdomen. This information is not intended to replace advice given to you by your health care provider. Make sure you discuss any questions you have with your health care provider. Document Revised: 06/30/2019 Document Reviewed: 06/30/2019 Elsevier Patient Education  2021 [employer]    Here some information about pelvic floor dysfunction. This may be contributing to some of your symptoms. We will continue with our evaluation but I do want you to consider adding on fiber supplement  with low-dose MiraLAX daily. We could also refer to pelvic floor physical therapy.   Pelvic Floor Dysfunction, Female Pelvic floor dysfunction (PFD) is a condition that results when the group of muscles and connective tissues that support the organs in the pelvis (pelvic floor muscles) do not work well. These muscles and their connections form a sling that supports the colon and bladder. In women, they also support the uterus. PFD causes pelvic floor muscles to be too weak, too tight, or both. In PFD, muscle movements are not coordinated. This may cause bowel or bladder problems. It may also cause pain. What are the causes? This condition may be caused by an injury to the pelvic area or by a weakening of pelvic muscles. This often results from pregnancy and childbirth or other types of strain. In many cases, the exact cause is not known. What increases the risk? The following factors may make you more likely to develop this condition: Having chronic bladder tissue inflammation (interstitial cystitis). Being an older person. Being overweight. History of radiation treatment for cancer in the pelvic region. Previous pelvic surgery, such as removal of the uterus (hysterectomy). What are the signs or symptoms? Symptoms of this condition vary and may include: Bladder symptoms, such as: Trouble starting urination and emptying the bladder. Frequent urinary tract infections. Leaking urine when coughing, laughing, or exercising (stress incontinence). Having to pass urine urgently or frequently. Pain when passing urine. Bowel symptoms, such as: Constipation. Urgent or frequent bowel movements. Incomplete bowel movements. Painful bowel movements. Leaking stool or gas. Unexplained genital or rectal pain. Genital or rectal muscle spasms. Low back pain. Other symptoms may include: A heavy, full, or aching feeling in the vagina. A bulge that protrudes into the vagina. Pain during or after  sex. How is this diagnosed? This condition may be diagnosed based on: Your symptoms and medical history. A physical exam. During the exam, your health care provider may check your pelvic muscles for tightness, spasm, pain, or weakness. This may include a rectal exam and a pelvic exam. In some cases, you may have diagnostic tests, such as: Electrical muscle function tests. Urine flow testing. X-ray tests of bowel function. Ultrasound of the pelvic organs. How is this treated? Treatment for this condition depends on the symptoms. Treatment options include: Physical therapy. This may include Kegel exercises to help relax or strengthen the pelvic floor muscles. Biofeedback. This type of therapy provides feedback on how tight your pelvic floor muscles are so that you can learn to control them. Internal or external massage therapy. A treatment that involves electrical stimulation of the pelvic floor muscles to help control pain (transcutaneous electrical nerve stimulation, or TENS). Sound wave therapy (ultrasound) to reduce muscle spasms. Medicines, such as: Muscle relaxants. Bladder control medicines. Surgery to reconstruct or support pelvic floor muscles may be an option if other treatments do not help. Follow  these instructions at home: Activity Do your usual activities as told by your health care provider. Ask your health care provider if you should modify any activities. Do pelvic floor strengthening or relaxing exercises at home as told by your physical therapist. Lifestyle Maintain a healthy weight. Eat foods that are high in fiber, such as beans, whole grains, and fresh fruits and vegetables. Limit foods that are high in fat and processed sugars, such as fried or sweet foods. Manage stress with relaxation techniques such as yoga or meditation. General instructions If you have problems with leakage: Use absorbable pads or wear padded underwear. Wash frequently with mild soap. Keep  your genital and anal area as clean and dry as possible. Ask your health care provider if you should try a barrier cream to prevent skin irritation. Take warm baths to relieve pelvic muscle tension or spasms. Take over-the-counter and prescription medicines only as told by your health care provider. Keep all follow-up visits. How is this prevented? The cause of PFD is not always known, but there are a few things you can do to reduce the risk of developing this condition, including: Staying at a healthy weight. Getting regular exercise. Managing stress. Contact a health care provider if: Your symptoms are not improving with home care. You have signs or symptoms of PFD that get worse at home. You develop new signs or symptoms. You have signs of a urinary tract infection, such as: Fever. Chills. Increased urinary frequency. A burning feeling when urinating. You have not had a bowel movement in 3 days (constipation). Summary Pelvic floor dysfunction results when the muscles and connective tissues in your pelvic floor do not work well. These muscles and their connections form a sling that supports your colon and bladder. In women, they also support the uterus. PFD may be caused by an injury to the pelvic area or by a weakening of pelvic muscles. PFD causes pelvic floor muscles to be too weak, too tight, or a combination of both. Symptoms may vary from person to person. In most cases, PFD can be treated with physical therapies and medicines. Surgery may be an option if other treatments do not help. This information is not intended to replace advice given to you by your health care provider. Make sure you discuss any questions you have with your health care provider. Document Revised: 06/30/2020 Document Reviewed: 06/30/2020 Elsevier Patient Education  2022 [employer].

## 2023-06-11 NOTE — Progress Notes (Signed)
 06/11/2023 Crystal Wong [id number] [birth year]  Referring provider: Bennie Pierini, * Primary GI doctor: Dr. Chales Abrahams  ASSESSMENT AND PLAN:   GERD with history of hypercontractile esophagus EGD 06/09/2019 normal esophagus biopsy showed reflux negative Barrett's and EOE, mild chronic gastritis negative H. pylori normal duodenum 24-hour pH/manometry 12/27/2022 normal acid exposure good acid suppression on PPIs, normal esophageal exposure in upright supine position positive symptom correlation with regurgitation, esophageal manometry normal relaxation of EG junction no achalasia,  jackhammer esophagus  On pantoprazole 20 mg twice daily and Pepcid as needed 03/13/2023 no anemia, liver function normal  Possibly worsened with Ozempic use Patient unable to tolerate Cardizem due to blood pressure, not interested in amitriptyline may worsen constipation as well continue conservative measures for now, consider cutting back on Ozempic, right upper quadrant ultrasound Can do 2 altoid mints sublingual before each meal can provide relief for attacks of DES-related chest pain.  Can do IBGard/Levsin  Constipation Negative Cologuard January 23, recall January 2026 On Linzess 290 mcg but she states twice in the last month she has been having back ups with it.  She is on the linzess 290 mcg and miralax twice a day, she is having hemorrhoidal bleeding Coorelates with increase in ozempic dose Continue linzess 290 with miralax as needed Consider motegrity Consider pelvic floor PT, given information  Hemorrhioid FOBT negative, large external hemorrhoidal skin tags on exam with large right posterior inflamed internal hemorrhoid Worse after miralax purge Will give suppositories, follow-up consider banding in the office if continued issue We will follow-up 2 to 3 months for evaluation  Type 2 diabetes On Ozempic 2021 Cardiac calcium score of 0   Morbid obesity  There is no height or weight on file  to calculate BMI.  -Patient has been advised to make an attempt to improve diet and exercise patterns to aid in weight loss. -Recommended diet heavy in fruits and veggies and low in animal meats, cheeses, and dairy products, appropriate calorie intake   Patient Care Team: Bennie Pierini, FNP as PCP - General (Family Medicine) Little Ishikawa, MD as PCP - Cardiology (Cardiology) Janalyn Harder, MD (Inactive) as Consulting Physician (Dermatology)  HISTORY OF PRESENT ILLNESS: 68 y.o. female with a past medical history listed below presents for evaluation of GERD.   Patient states she was unable to tolerate diltiazem has consistently low blood pressure and this made her too dizzy.  She states she takes Altoid's and has them in her purse and takes Levsin as needed which helps control the contractile esophagus.  She is on pantoprazole twice daily and states her GERD is well-controlled. She denies any abdominal pain, nausea vomiting. Patient is on Ozempic within recent increase in dose in the past 3 months has been having worsening constipation in the last 1 to 2 months, normally on Linzess 290 without issues however has been having to do MiraLAX purge and MiraLAX twice daily in the last 1 to 2 months.  She feels incomplete emptying. Has had 1-2 episodes of rectal bleeding which she blames on her hemorrhoids, she states she has had hemorrhoids since she has had her 2 children who are large babies, she is also status post hysterectomy has some urinary nocturia and frequency with occasional dribbling. Denies weight loss, fever or chills. Patient has had a previous colonoscopy which was unremarkable and then has done Cologuard last time 2023 which was negative recall 2026.  She  reports that she has never smoked. She has never used smokeless  tobacco. She reports that she does not currently use alcohol. She reports that she does not use drugs.  RELEVANT GI HISTORY, IMAGING AND  LABS: Results          CBC    Component Value Date/Time   WBC 6.1 03/13/2023 1218   WBC 4.4 12/06/2022 1950   RBC 4.62 03/13/2023 1218   RBC 4.54 12/06/2022 1950   HGB 13.7 03/13/2023 1218   HCT 41.8 03/13/2023 1218   PLT 247 03/13/2023 1218   MCV 91 03/13/2023 1218   MCH 29.7 03/13/2023 1218   MCH 30.0 12/06/2022 1950   MCHC 32.8 03/13/2023 1218   MCHC 34.2 12/06/2022 1950   RDW 12.9 03/13/2023 1218   LYMPHSABS 1.7 03/13/2023 1218   MONOABS 0.4 10/04/2021 2158   EOSABS 0.1 03/13/2023 1218   BASOSABS 0.1 03/13/2023 1218   Recent Labs    09/05/22 1134 12/06/22 1950 03/13/23 1218  HGB 12.9 13.6 13.7    CMP     Component Value Date/Time   NA 142 03/13/2023 1218   K 4.6 03/13/2023 1218   CL 104 03/13/2023 1218   CO2 24 03/13/2023 1218   GLUCOSE 110 (H) 03/13/2023 1218   GLUCOSE 94 12/06/2022 1950   BUN 17 03/13/2023 1218   CREATININE 1.08 (H) 03/13/2023 1218   CREATININE 0.84 08/23/2012 1155   CALCIUM 9.7 03/13/2023 1218   PROT 6.6 03/13/2023 1218   ALBUMIN 4.3 03/13/2023 1218   AST 20 03/13/2023 1218   ALT 17 03/13/2023 1218   ALKPHOS 91 03/13/2023 1218   BILITOT 0.5 03/13/2023 1218   GFRNONAA >60 12/06/2022 1950   GFRNONAA 79 08/23/2012 1155   GFRAA 83 03/02/2020 1030   GFRAA >89 08/23/2012 1155      Latest Ref Rng & Units 03/13/2023   12:18 PM 09/05/2022   11:34 AM 03/07/2022   12:29 PM  Hepatic Function  Total Protein 6.0 - 8.5 g/dL 6.6  6.6  6.9   Albumin 3.9 - 4.9 g/dL 4.3  4.5  4.4   AST 0 - 40 IU/L 20  21  21    ALT 0 - 32 IU/L 17  19  19    Alk Phosphatase 44 - 121 IU/L 91  82  80   Total Bilirubin 0.0 - 1.2 mg/dL 0.5  0.4  0.3       Current Medications:   Current Outpatient Medications (Endocrine & Metabolic):    Semaglutide, 1 MG/DOSE, 4 MG/3ML SOPN, Inject 1 mg as directed once a week.  Current Outpatient Medications (Cardiovascular):    Evolocumab (REPATHA SURECLICK) 140 MG/ML SOAJ, INJECT 140 MG INTO THE SKIN EVERY 14 (FOURTEEN) DAYS.    rosuvastatin (CRESTOR) 40 MG tablet, Take 1 tablet (40 mg total) by mouth daily.  Current Outpatient Medications (Respiratory):    albuterol (VENTOLIN HFA) 108 (90 Base) MCG/ACT inhaler, 2 puff q6 prn   budesonide-formoterol (SYMBICORT) 80-4.5 MCG/ACT inhaler, Take 2 puffs first thing in am and then another 2 puffs about 12 hours later.   fluticasone (FLONASE) 50 MCG/ACT nasal spray, Place 2 sprays into both nostrils daily.   LORATADINE-D 24HR 10-240 MG 24 hr tablet, Take 1 tablet by mouth once daily    Current Outpatient Medications (Other):    dutasteride (AVODART) 0.5 MG capsule, Take 0.5 mg by mouth daily.   escitalopram (LEXAPRO) 20 MG tablet, Take 1 tablet (20 mg total) by mouth daily.   famotidine (PEPCID) 20 MG tablet, Take 1 tablet (20 mg total) by mouth 2 (two)  times daily.   hydrocortisone (ANUSOL-HC) 25 MG suppository, PLACE 1 SUPPOSITORY RECTALLY 2 TIMES DAILY.   hyoscyamine (LEVSIN SL) 0.125 MG SL tablet, TAKE 1 TABLET (0.125 MG TOTAL) BY MOUTH EVERY 6 (SIX) HOURS AS NEEDED.   linaclotide (LINZESS) 290 MCG CAPS capsule, Take 1 capsule (290 mcg total) by mouth daily before breakfast.   LORazepam (ATIVAN) 1 MG tablet, Take 1 tablet (1 mg total) by mouth every 8 (eight) hours as needed.   melatonin 5 MG TABS, Take 5 mg by mouth at bedtime.   pantoprazole (PROTONIX) 20 MG tablet, Take 1 tablet (20 mg total) by mouth 2 (two) times daily.   VITAMIN D, CHOLECALCIFEROL, PO, Take 1 tablet by mouth daily.   Medical History:  Past Medical History:  Diagnosis Date   Anxiety    Asthma    Depression    GERD (gastroesophageal reflux disease)    Heart murmur    Hyperlipidemia    Insomnia    Allergies:  Allergies  Allergen Reactions   Sulfa Antibiotics Anaphylaxis   Sulfamethoxazole-Trimethoprim Swelling   Elemental Sulfur Other (See Comments)    Other reaction(s): Angioedema (ALLERGY/intolerance) Other reaction(s): Angioedema (ALLERGY/intolerance)     Surgical History:  She   has a past surgical history that includes Partial hysterectomy; Ankle fracture surgery (Left); Knee arthroscopy (Bilateral); Breast lumpectomy (Bilateral); Carpal tunnel release (Right); Colonoscopy; Carpal tunnel release (Left); Esophageal manometry (N/A, 12/27/2022); and 24 hour ph study (N/A, 12/27/2022). Family History:  Her family history includes Colon cancer (age of onset: 27) in her father; Colon polyps in her mother; Diabetes in her mother; Gallbladder disease in her daughter; Gallstones in her paternal grandmother; Heart disease in her mother.  REVIEW OF SYSTEMS  : All other systems reviewed and negative except where noted in the History of Present Illness.  PHYSICAL EXAM: There were no vitals taken for this visit. General Appearance: Well nourished, in no apparent distress. Respiratory: Respiratory effort normal, BS equal bilaterally without rales, rhonchi, wheezing. Cardio: RRR with no MRGs. Abdomen: Soft,  Obese ,active bowel sounds. No tenderness. Without guarding and Without rebound. No masses. Rectal: Large posterior hemorrhoidal skin tag, right posterior inflamed internal hemorrhoid, unremarkable internal rectal exam with brown stool, cavernous, negative fecal occult blood Musculoskeletal: Full ROM, Normal gait Neuro: Alert and  oriented x4;  No focal deficits. Psych:  Cooperative. Normal mood and affect.    Doree Albee, PA-C 7:57 AM

## 2023-06-12 NOTE — Progress Notes (Signed)
 Agree with assessment/plan.  Edman Circle, MD [facility] [phone]

## 2023-06-18 ENCOUNTER — Ambulatory Visit
Admission: RE | Admit: 2023-06-18 | Discharge: 2023-06-18 | Disposition: A | Discharge status: Home / Self Care | Source: Ambulatory Visit | Attending: Nurse Practitioner | Admitting: Nurse Practitioner

## 2023-06-18 ENCOUNTER — Ambulatory Visit

## 2023-06-18 DIAGNOSIS — Z139 Encounter for screening, unspecified: Secondary | ICD-10-CM

## 2023-06-18 DIAGNOSIS — Z1231 Encounter for screening mammogram for malignant neoplasm of breast: Secondary | ICD-10-CM | POA: Diagnosis not present

## 2023-07-01 NOTE — Progress Notes (Signed)
 CAD  Cardiology Office Note:    Date:  07/06/2023   ID:  VALLEY GIOVE, DOB 05-12-1955, MRN [id number]  PCP:  Delfina Feller, FNP  Cardiologist:  Wendie Hamburg, MD  Electrophysiologist:  None   Referring MD: Delfina Feller, *   Chief Complaint  Patient presents with   Palpitations    History of Present Illness:    Crystal Wong is a 68 y.o. female with a hx of anxiety, depression, asthma, GERD, hyperlipidemia who presents for follow-up.  She was referred by Dr. Savannah Curlin for pre-op evaluation, initially seen on 05/19/2019.  She was referred to gastroenterology for EGD, requested cardiac evaluation prior to procedure.    At initial clinic visit on 05/19/2019, no further work-up was recommended prior to her EGD, which she underwent on 06/09/2019.  TTE on 06/03/2019 showed LVEF 60 to 65%, normal RV function, mild aortic stenosis, mild to moderate aortic regurgitation.  Zio patch x14 days on 07/17/2019 showed no significant arrhythmias.  Calcium  score on 06/03/2019 was 0.  LDL 205 on 08/19/2019 despite being on rosuvastatin  40 mg daily.  Referred to lipid clinic, started on Repatha .  Repeat lipid panel on 12/24/2019 showed marked improvement in LDL to 75.  Reported worsening palpitations and in Zio patch x3 days on 05/06/2020 showed no significant arrhythmias.  Echocardiogram on 05/18/2020 showed normal biventricular function, mild AS, mild to moderate AI.  Echocardiogram on 06/06/2022 showed EF 60 to 65%, grade 1 diastolic dysfunction, normal RV function, mild aortic regurgitation, mild aortic stenosis.  Zio patch x 7 days on 05/11/2022 showed no significant arrhythmias.  Since last clinic visit, she reports she is doing well.  States that her gastroenterologist made changes to her medications and palpitations resolved. Denies any chest pain, dyspnea, lower extremity edema, or palpitations.  Does report some lightheadedness with standing, denies any syncope.  She is doing Pilates 5 times per  week and walking 3 times per week at least 30 minutes, denies any exertional symptoms.    Past Medical History:  Diagnosis Date   Anxiety    Asthma    Depression    GERD (gastroesophageal reflux disease)    Heart murmur    Hyperlipidemia    Insomnia     Past Surgical History:  Procedure Laterality Date   84 HOUR PH STUDY N/A 12/27/2022   Procedure: 24 HOUR PH STUDY;  Surgeon: Lajuan Pila, MD;  Location: [facility];  Service: Gastroenterology;  Laterality: N/A;  with impectence   ANKLE FRACTURE SURGERY Left    Pins and screws   BREAST LUMPECTOMY Bilateral    left x 1, right x 2   CARPAL TUNNEL RELEASE Right    CARPAL TUNNEL RELEASE Left    COLONOSCOPY     ESOPHAGEAL MANOMETRY N/A 12/27/2022   Procedure: ESOPHAGEAL MANOMETRY (EM);  Surgeon: Lajuan Pila, MD;  Location: [facility];  Service: Gastroenterology;  Laterality: N/A;   KNEE ARTHROSCOPY Bilateral    PARTIAL HYSTERECTOMY      Current Medications: Current Meds  Medication Sig   albuterol  (VENTOLIN  HFA) 108 (90 Base) MCG/ACT inhaler 2 puff q6 prn   budesonide -formoterol  (SYMBICORT ) 80-4.5 MCG/ACT inhaler Take 2 puffs first thing in am and then another 2 puffs about 12 hours later.   dutasteride (AVODART) 0.5 MG capsule Take 0.5 mg by mouth daily.   escitalopram  (LEXAPRO ) 20 MG tablet Take 1 tablet (20 mg total) by mouth daily.   Evolocumab  (REPATHA  SURECLICK) 140 MG/ML SOAJ INJECT 140 MG INTO THE SKIN  EVERY 14 (FOURTEEN) DAYS.   famotidine  (PEPCID ) 20 MG tablet Take 1 tablet (20 mg total) by mouth 2 (two) times daily.   fluticasone  (FLONASE ) 50 MCG/ACT nasal spray Place 2 sprays into both nostrils daily.   hydrocortisone  (ANUSOL -HC) 2.5 % rectal cream Place 1 Application rectally 2 (two) times daily.   hydrocortisone  (ANUSOL -HC) 25 MG suppository Place 1 suppository (25 mg total) rectally 2 (two) times daily.   hyoscyamine  (LEVSIN SL) 0.125 MG SL tablet TAKE 1 TABLET (0.125 MG TOTAL) BY MOUTH EVERY 6 (SIX) HOURS  AS NEEDED.   linaclotide  (LINZESS ) 290 MCG CAPS capsule Take 1 capsule (290 mcg total) by mouth daily before breakfast.   LORATADINE -D 24HR 10-240 MG 24 hr tablet Take 1 tablet by mouth once daily   LORazepam  (ATIVAN ) 1 MG tablet Take 1 tablet (1 mg total) by mouth every 8 (eight) hours as needed.   melatonin 5 MG TABS Take 5 mg by mouth at bedtime.   pantoprazole  (PROTONIX ) 20 MG tablet Take 1 tablet (20 mg total) by mouth 2 (two) times daily.   rosuvastatin  (CRESTOR ) 40 MG tablet Take 1 tablet (40 mg total) by mouth daily.   Semaglutide , 1 MG/DOSE, 4 MG/3ML SOPN Inject 1 mg as directed once a week.   VITAMIN D , CHOLECALCIFEROL, PO Take 1 tablet by mouth daily.      Allergies:   Sulfa antibiotics, Sulfamethoxazole-trimethoprim, and Elemental sulfur   Social History   Socioeconomic History   Marital status: Married    Spouse name: Not on file   Number of children: 2   Years of education: Not on file   Highest education level: Associate degree: occupational, Scientist, product/process development, or vocational program  Occupational History   Occupation: retired  Tobacco Use   Smoking status: Never   Smokeless tobacco: Never  Vaping Use   Vaping status: Never Used  Substance and Sexual Activity   Alcohol use: Not Currently   Drug use: No   Sexual activity: Yes  Other Topics Concern   Not on file  Social History Narrative   Not on file   Social Drivers of Health   Financial Resource Strain: Low Risk  (03/13/2023)   Overall Financial Resource Strain (CARDIA)    Difficulty of Paying Living Expenses: Not hard at all  Food Insecurity: No Food Insecurity (03/13/2023)   Hunger Vital Sign    Worried About Running Out of Food in the Last Year: Never true    Ran Out of Food in the Last Year: Never true  Transportation Needs: No Transportation Needs (03/13/2023)   PRAPARE - Administrator, Civil Service (Medical): No    Lack of Transportation (Non-Medical): No  Physical Activity: Sufficiently Active  (03/13/2023)   Exercise Vital Sign    Days of Exercise per Week: 6 days    Minutes of Exercise per Session: 70 min  Stress: Stress Concern Present (03/13/2023)   [employer] of Occupational Health - Occupational Stress Questionnaire    Feeling of Stress : To some extent  Social Connections: Unknown (03/13/2023)   Social Connection and Isolation Panel [NHANES]    Frequency of Communication with Friends and Family: More than three times a week    Frequency of Social Gatherings with Friends and Family: Once a week    Attends Religious Services: Patient declined    Database administrator or Organizations: Yes    Attends Banker Meetings: 1 to 4 times per year    Marital Status: Married  Family History: The patient's family history includes Colon cancer (age of onset: 37) in her father; Colon polyps in her mother; Diabetes in her mother; Gallbladder disease in her daughter; Gallstones in her paternal grandmother; Heart disease in her mother. There is no history of Esophageal cancer, Stomach cancer, Rectal cancer, or Breast cancer.  ROS:   Please see the history of present illness.     All other systems reviewed and are negative.  EKGs/Labs/Other Studies Reviewed:    The following studies were reviewed today:   EKG:   07/06/2023: Normal sinus rhythm, right bundle branch block, left anterior fascicular block, rate 79 05/11/22: NSR, RBBB, LAFB, rate 72 06/07/21: Normal sinus rhythm, rate 77, right bundle branch block, left anterior fascicular block, no ST abnormalities 04/23/2020- The ekg ordered  demonstrates normal sinus rhythm, rate 79, RBBB, no ST/T wave abnormalities  Recent Labs: 03/13/2023: ALT 17; BUN 17; Creatinine, Ser 1.08; Hemoglobin 13.7; Platelets 247; Potassium 4.6; Sodium 142  Recent Lipid Panel    Component Value Date/Time   CHOL 165 03/13/2023 1218   CHOL 225 (H) 08/23/2012 1155   TRIG 95 03/13/2023 1218   TRIG 125 12/23/2013 1004   TRIG 51 08/23/2012 1155    HDL 57 03/13/2023 1218   HDL 58 12/23/2013 1004   HDL 59 08/23/2012 1155   CHOLHDL 2.9 03/13/2023 1218   LDLCALC 91 03/13/2023 1218   LDLCALC 163 (H) 12/23/2013 1004   LDLCALC 156 (H) 08/23/2012 1155    Physical Exam:    VS:  BP 126/72 (BP Location: Left Arm, Patient Position: Sitting)   Pulse 79   Ht 5\' 6"  (1.676 m)   Wt 161 lb 6.4 oz (73.2 kg)   SpO2 98%   BMI 26.05 kg/m     Wt Readings from Last 3 Encounters:  07/06/23 161 lb 6.4 oz (73.2 kg)  06/11/23 158 lb 2 oz (71.7 kg)  06/05/23 159 lb (72.1 kg)     GEN:  Well nourished, well developed in no acute distress HEENT: Normal NECK: No JVD; No carotid bruits CARDIAC: RRR, 2/6 systolic heart murmur RESPIRATORY:  Clear to auscultation without rales, wheezing or rhonchi  ABDOMEN: Soft, non-tender, non-distended MUSCULOSKELETAL:  No edema; No deformity  SKIN: Warm and dry NEUROLOGIC:  Alert and oriented x 3 PSYCHIATRIC:  Normal affect   ASSESSMENT:    1. Palpitations   2. Aortic valve stenosis, etiology of cardiac valve disease unspecified   3. Mixed hyperlipidemia      PLAN:    Palpitations: Zio patch x14 days on 07/17/2019 showed no significant arrhythmias.  At clinic visit 04/23/2020, reports now having frequent palpitations, different in character than palpitation she was having before.  Zio patch x3 days on 05/06/2020 showed no significant arrhythmias.  Echocardiogram on 05/18/2020 showed normal biventricular function, mild AS, mild to moderate AI.  Reported worsening palpitations and underwent repeat Zio patch x 7 days on 05/11/2022 which showed no significant arrhythmias. - Reports palpitations have improved  Aortic stenosis/regurgitation:  TTE on 06/03/2019 showed LVEF 60 to 65%, normal RV function, mild aortic stenosis, mild to moderate aortic regurgitation.  Echocardiogram on 05/18/2020 showed normal biventricular function, mild AS, mild to moderate AI. Echocardiogram on 06/06/2022 showed EF 60 to 65%, grade 1  diastolic dysfunction, normal RV function, mild aortic regurgitation, mild aortic stenosis.  - Plan repeat echocardiogram in 1 year to monitor  Hyperlipidemia: LDL 205 on 08/19/2019 despite being on rosuvastatin  40 mg daily.  Referred to lipid clinic, started on Repatha .  Repeat lipid panel on 12/24/2019 showed marked improvement in LDL to 75.  Calcium  score 0 on 06/03/2019. -LDL 91 on 03/13/2023, continue Repatha   RTC in 1 year    Medication Adjustments/Labs and Tests Ordered: Current medicines are reviewed at length with the patient today.  Concerns regarding medicines are outlined above.  Orders Placed This Encounter  Procedures   EKG 12-Lead   ECHOCARDIOGRAM COMPLETE   No orders of the defined types were placed in this encounter.   Patient Instructions  Medication Instructions:  Continue same medications *If you need a refill on your cardiac medications before your next appointment, please call your pharmacy*  Lab Work: None ordered  Testing/Procedures: Schedule Echo in 1 year 07/2024  Follow-Up: At [facility], you and your health needs are our priority.  As part of our continuing mission to provide you with exceptional heart care, our providers are all part of one team.  This team includes your primary Cardiologist (physician) and Advanced Practice Providers or APPs (Physician Assistants and Nurse Practitioners) who all work together to provide you with the care you need, when you need it.  Your next appointment:  1 year    Call in Feb to schedule May appointment     Provider:  Dr.Tahjay Binion   We recommend signing up for the patient portal called "MyChart".  Sign up information is provided on this After Visit Summary.  MyChart is used to connect with patients for Virtual Visits (Telemedicine).  Patients are able to view lab/test results, encounter notes, upcoming appointments, etc.  Non-urgent messages can be sent to your provider as well.   To learn more about what  you can do with MyChart, go to [email or web address].            Signed, Wendie Hamburg, MD  07/06/2023 1:07 PM    [facility]

## 2023-07-06 ENCOUNTER — Encounter: Payer: Self-pay | Admitting: Cardiology

## 2023-07-06 ENCOUNTER — Ambulatory Visit: Payer: Medicare Other | Attending: Cardiology | Admitting: Cardiology

## 2023-07-06 VITALS — BP 126/72 | HR 79 | Ht 66.0 in | Wt 161.4 lb

## 2023-07-06 DIAGNOSIS — R002 Palpitations: Secondary | ICD-10-CM

## 2023-07-06 DIAGNOSIS — I350 Nonrheumatic aortic (valve) stenosis: Secondary | ICD-10-CM | POA: Diagnosis not present

## 2023-07-06 DIAGNOSIS — E782 Mixed hyperlipidemia: Secondary | ICD-10-CM | POA: Diagnosis not present

## 2023-07-06 NOTE — Patient Instructions (Signed)
 Medication Instructions:  Continue same medications *If you need a refill on your cardiac medications before your next appointment, please call your pharmacy*  Lab Work: None ordered  Testing/Procedures: Schedule Echo in 1 year 07/2024  Follow-Up: At [facility], you and your health needs are our priority.  As part of our continuing mission to provide you with exceptional heart care, our providers are all part of one team.  This team includes your primary Cardiologist (physician) and Advanced Practice Providers or APPs (Physician Assistants and Nurse Practitioners) who all work together to provide you with the care you need, when you need it.  Your next appointment:  1 year    Call in Feb to schedule May appointment     Provider:  Dr.Schumann   We recommend signing up for the patient portal called "MyChart".  Sign up information is provided on this After Visit Summary.  MyChart is used to connect with patients for Virtual Visits (Telemedicine).  Patients are able to view lab/test results, encounter notes, upcoming appointments, etc.  Non-urgent messages can be sent to your provider as well.   To learn more about what you can do with MyChart, go to [email or web address].

## 2023-07-18 ENCOUNTER — Telehealth: Admitting: Physician Assistant

## 2023-07-18 DIAGNOSIS — W57XXXA Bitten or stung by nonvenomous insect and other nonvenomous arthropods, initial encounter: Secondary | ICD-10-CM

## 2023-07-18 DIAGNOSIS — Z9189 Other specified personal risk factors, not elsewhere classified: Secondary | ICD-10-CM

## 2023-07-18 MED ORDER — DOXYCYCLINE HYCLATE 100 MG PO TABS
200.0000 mg | ORAL_TABLET | Freq: Once | ORAL | 0 refills | Status: AC
Start: 2023-07-18 — End: 2023-07-18

## 2023-07-18 NOTE — Progress Notes (Signed)
 I have spent 5 minutes in review of e-visit questionnaire, review and updating patient chart, medical decision making and response to patient.   Piedad Climes, PA-C

## 2023-07-18 NOTE — Progress Notes (Signed)
 E-Visit for Tick Bite  Thank you for describing your tick bite, Here is how we plan to help! Based on the information that you shared with me it looks like you have A tick that bite that we will treat with a short course of doxycycline .  In most cases a tick bite is painless and does not itch.  Most tick bites in which the tick is quickly removed do not require prescriptions. Ticks can transmit several diseases if they are infected and remain attacked to your skin. Therefore the length that the tick was attached and any symptoms you have experienced after the bite are import to accurately develop your custom treatment plan. In most cases a single dose of doxycycline  may prevent the development of a more serious condition.  Based on your information I have Provided a home care guide for tick bites and  instructions on when to call for help I have sent a single dose of doxycycline  200 mg to the pharmacy you selected. Please make sure that you selected a pharmacy that is open now.  Which ticks  are associated with illness?  The Wood Tick (dog tick) is the size of a watermelon seed and can sometimes transmit [facility] spotted fever and [place]  tick fever.   The Deer Tick (black-legged tick) is between the size of a poppy seed (pin head) and an apple seed, and can sometimes transmit Lyme disease.  A brown to black tick with a white splotch on its back is likely a female Amblyomma americanum ([place] tick). This tick has been associated with Southern Tick Associated illness ( STARI)  Lyme disease has become the most common tick-borne illness in the [place] . The risk of Lyme disease following a recognized deer tick bite is estimated to be 1%.  The majority of cases of Lyme disease start with a bull's eye rash at the site of the tick bite. The rash can occur days to weeks (typically 7-10 days) after a tick bite. Treatment with antibiotics is indicated if this rash appears. Flu-like  symptoms may accompany the rash, including: fever, chills, headaches, muscle aches, and fatigue. Removing ticks promptly may prevent tick borne disease.  What can be used to prevent Tick Bites?  Insect repellant with at leas 20% DEET. Wearing long pants with sock and shoes. Avoiding tall grass and heavily wooded areas. Checking your skin after being outdoors. Shower with a washcloth after outdoor exposures.  HOME CARE ADVICE FOR TICK BITE  Wood Tick Removal:  Use a pair of tweezers and grasp the wood tick close to the skin (on its head). Pull the wood tick straight upward without twisting or crushing it. Maintain a steady pressure until it releases its grip.   If tweezers aren't available, use fingers, a loop of thread around the jaws, or a needle between the jaws for traction.  Note: covering the tick with petroleum jelly, nail polish or rubbing alcohol doesn't work. Neither does touching the tick with a hot or cold object. Tiny Deer Tick Removal:   Needs to be scraped off with a knife blade or credit card edge. Place tick in a sealed container (e.g. glass jar, zip lock plastic bag), in case your doctor wants to see it. Tick's Head Removal:  If the wood tick's head breaks off in the skin, it must be removed. Clean the skin. Then use a sterile needle to uncover the head and lift it out or scrape it off.  If a very small  piece of the head remains, the skin will eventually slough it off. Antibiotic Ointment:  Wash the wound and your hands with soap and water after removal to prevent catching any tick disease.  Apply an over the counter antibiotic ointment (e.g. bacitracin) to the bite once. Expected Course: Tick bites normally don't itch or hurt. That's why they often go unnoticed. Call Your Doctor If:  You can't remove the tick or the tick's head Fever, a severe head ache, or rash occur in the next 2 weeks Bite begins to look infected Lyme's disease is common in your area You have not  had a tetanus in the last 10 years Your current symptoms become worse    MAKE SURE YOU  Understand these instructions. Will watch your condition. Will get help right away if you are not doing well or get worse.    Thank you for choosing an e-visit.  Your e-visit answers were reviewed by a board certified advanced clinical practitioner to complete your personal care plan. Depending upon the condition, your plan could have included both over the counter or prescription medications.  Please review your pharmacy choice. Make sure the pharmacy is open so you can pick up prescription now. If there is a problem, you may contact your provider through [employer] and have the prescription routed to another pharmacy.  Your safety is important to us . If you have drug allergies check your prescription carefully.   For the next 24 hours you can use MyChart to ask questions about today's visit, request a non-urgent call back, or ask for a work or school excuse. You will get an email in the next two days asking about your experience. I hope that your e-visit has been valuable and will speed your recovery.

## 2023-07-22 ENCOUNTER — Other Ambulatory Visit: Payer: Self-pay | Admitting: Nurse Practitioner

## 2023-08-13 ENCOUNTER — Other Ambulatory Visit: Payer: Self-pay

## 2023-08-13 ENCOUNTER — Ambulatory Visit: Attending: Physician Assistant | Admitting: Physical Therapy

## 2023-08-13 ENCOUNTER — Encounter: Payer: Self-pay | Admitting: Physical Therapy

## 2023-08-13 ENCOUNTER — Other Ambulatory Visit: Payer: Self-pay | Admitting: Gastroenterology

## 2023-08-13 DIAGNOSIS — R278 Other lack of coordination: Secondary | ICD-10-CM | POA: Insufficient documentation

## 2023-08-13 NOTE — Therapy (Signed)
 OUTPATIENT PHYSICAL THERAPY FEMALE PELVIC EVALUATION   Patient Name: Crystal Wong MRN: [id number] DOB:September 15, 1955, 68 y.o., female Today's Date: 08/13/2023  END OF SESSION:  PT End of Session - 08/13/23 1214     Visit Number 1    Date for PT Re-Evaluation 11/13/23    Authorization Type [employer] MEDICARE    Authorization Time Period waiting on auth    PT Start Time 1103    PT Stop Time 1145    PT Time Calculation (min) 42 min    Activity Tolerance Patient tolerated treatment well;Patient limited by pain    Behavior During Therapy WFL for tasks assessed/performed             Past Medical History:  Diagnosis Date   Anxiety    Asthma    Depression    GERD (gastroesophageal reflux disease)    Heart murmur    Hyperlipidemia    Insomnia    Past Surgical History:  Procedure Laterality Date   92 HOUR PH STUDY N/A 12/27/2022   Procedure: 24 HOUR PH STUDY;  Surgeon: Lajuan Pila, MD;  Location: [facility];  Service: Gastroenterology;  Laterality: N/A;  with impectence   ANKLE FRACTURE SURGERY Left    Pins and screws   BREAST LUMPECTOMY Bilateral    left x 1, right x 2   CARPAL TUNNEL RELEASE Right    CARPAL TUNNEL RELEASE Left    COLONOSCOPY     ESOPHAGEAL MANOMETRY N/A 12/27/2022   Procedure: ESOPHAGEAL MANOMETRY (EM);  Surgeon: Lajuan Pila, MD;  Location: [facility];  Service: Gastroenterology;  Laterality: N/A;   KNEE ARTHROSCOPY Bilateral    PARTIAL HYSTERECTOMY     Patient Active Problem List   Diagnosis Date Noted   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   Chronic cough 10/25/2022   Constipation 08/07/2022   Diabetes mellitus treated with injections of non-insulin medication (HCC) 07/11/2022   Cough variant asthma 04/24/2016   BMI 33.0-33.9,adult 03/02/2016   Mixed hyperlipidemia 08/23/2012   Insomnia 08/23/2012   GERD (gastroesophageal reflux disease) 08/23/2012   Depression 08/23/2012   GAD (generalized anxiety disorder) 08/23/2012    PCP: Delfina Feller, FNP  REFERRING PROVIDER: Edmonia Gottron, PA-C  REFERRING DIAG: K59.00 (ICD-10-CM) - Constipation, unspecified constipation type R35.1 (ICD-10-CM) - Nocturia  THERAPY DIAG:  Other lack of coordination  Rationale for Evaluation and Treatment: Rehabilitation  ONSET DATE: 3 years ago- 2022, but sarted linger time ago in 1990-1992  SUBJECTIVE:  SUBJECTIVE STATEMENT: Pt reports that she has severe constipation She had a hysterectomy, thinks it is a part of it She has a condition where she digests food very slowly Hysterectomy 20 years ago.  Pt thinks when she started menopause her constipation got worse She reports that she is used to getting up 1-2 times/ night, reports that she has very large bladder Diagnosed with type 2 diabetes.  Doing Ozempic  since 18 months ago- thinks it made constipation worse Is taking Linzess  and miralax every day. Would love to get to a point where she does not have to take anything Gets bloated with constipation    Fluid intake:   PAIN:  Are you having pain? Yes abdominal pain when she gets backed up, wer back pain, cannot sleep NPRS scale: 7/10 low back pain when it is bad Pain location: low back  Pain type: aching Pain description: when constipated   Aggravating factors: constipation Relieving factors: having BMs  PRECAUTIONS: None  RED FLAGS: None   WEIGHT BEARING RESTRICTIONS: No  FALLS:  Has patient fallen in last 6 months? No  OCCUPATION: retired  ACTIVITY LEVEL : exercises on [facility] mat[facility], [facility], pelvic lifts 100 reps, crunches 150  PLOF: Independent  PATIENT GOALS: not to take anything for constipation  PERTINENT HISTORY:  Hysterectomy Sexual abuse: No  BOWEL MOVEMENT: Pain with bowel  movement: Yes sometimes her hemorrhoids bother her, no pain with Linzess  Type of bowel movement:Type (Bristol Stool Scale) 7 with Linzess  Fully empty rectum: No Leakage: Yes: a little bit, none with type 1  Pads: No Fiber supplement/laxative No  URINATION: Pain with urination: No Fully empty bladder: No Stream: Strong Urgency: Yes 30 mins- 2 hrs Frequency: 1/ hr Leakage: Coughing and jumping Pads: No period panties  INTERCOURSE:  Ability to have vaginal penetration Yes  Pain with intercourse: none DrynessYes - not using vaginal estrogen Climax: yes Marinoff Scale: 0/3 Laxative:  PREGNANCY: 2 very large babies Vaginal deliveries 2 Tearing No Episiotomy Yes  C-section deliveries 0 Currently pregnant No  PROLAPSE: None   OBJECTIVE:  Note: Objective measures were completed at Evaluation unless otherwise noted.   PATIENT SURVEYS:    PFIQ-7: 43  COGNITION: Overall cognitive status: Within functional limits for tasks assessed     SENSATION: Light touch: Appears intact  LUMBAR SPECIAL TESTS:  Single leg stance test: Positive for valgus knee bilat   POSTURE: rounded shoulders and forward head   LUMBARAROM/PROM: full   LOWER EXTREMITY ROM: full   LOWER EXTREMITY MMT: 4/5 overall  PALPATION:   General: upper chest breathing, elevated shoulders, shallow breathing, decreased involvement of respiratory diaphragm   Pelvic Alignment: even  Abdominal: tenderness and restrictions throughout                External Perineal Exam: deferred to next visit                             Internal Pelvic Floor: deferred to next   Patient confirms identification and approves PT to assess internal pelvic floor and treatment Yes  PELVIC MMT:   MMT eval  Vaginal   Internal Anal Sphincter   External Anal Sphincter   Puborectalis   Diastasis Recti 1 finger throughout, firm feel  (Blank rows = not tested)        TONE: Deferred to next visit  PROLAPSE: Deferred  to next visit  TODAY'S TREATMENT:  DATE: 08/13/2023  EVAL see below   PATIENT EDUCATION/ there act: Education details: Pt was educated on relevant anatomy, exam findings, HEP, expectations of PT   Person educated: Patient Education method: Explanation, Demonstration, Tactile cues, Verbal cues, and Handouts Education comprehension: verbalized understanding, returned demonstration, verbal cues required, tactile cues required, and needs further education  HOME EXERCISE PROGRAM: [id number]  ASSESSMENT:  CLINICAL IMPRESSION: Patient is a 68 y.o. F who was seen today for physical therapy evaluation and treatment for constipation and stress urinary incontinence.  She reported increased urinary urgency and frequency at times as well, wears period underwear and is frustrated. Reported vaginal dryness with intercourse.She has had constipation since 1990 after her hysterectomy, recently started working with a GI doctor. Taking Mira lax and linzess  on daily basis. [facility] but has to avoid even low impact exercises d/t stress urinary incontinence. At times has back pain up to 7/10 d/t constipation. Pt with upper chest breathing, breath holding tendencies, tenderness throughout abdomen, some bilateral knee weakness present, good hip and trunk mobility. Internal exam deferred to next visit. Pt motivated to participate in PT to achieve goals. She was educated on Physical therapy bowel recommendations today, will get a squatty potty, increase fiber in her diet, discussed reducing dairy and adding kiwi- pt is worried about too much sugar d/t type 2 diabetes. Pt is on Ozempic  and has new food aversions to meat. Not using vaginal estrogen. She will benefit from PT to achieve goals.   OBJECTIVE IMPAIRMENTS: decreased coordination, decreased knowledge of condition,  decreased strength, improper body mechanics, and pain.   ACTIVITY LIMITATIONS: continence and toileting  PARTICIPATION LIMITATIONS: interpersonal relationship, driving, and community activity  PERSONAL FACTORS: Time since onset of injury/illness/exacerbation are also affecting patient's functional outcome.   REHAB POTENTIAL: Good  CLINICAL DECISION MAKING: Evolving/moderate complexity  EVALUATION COMPLEXITY: Moderate   GOALS: Goals reviewed with patient? Yes  SHORT TERM GOALS: Target date: 09/10/2023    Pt will be independent with HEP.   Baseline: Goal status: INITIAL  2.  Pt will be independent with use of squatty potty, relaxed toileting mechanics, and improved bowel movement techniques in order to increase ease of bowel movements and complete evacuation.   Baseline:  Goal status: INITIAL  3.  Pt will be independent with the knack, urge suppression technique, and double voiding in order to improve bladder habits and decrease urinary incontinence.   Baseline:  Goal status: INITIAL  4.  Pt will be I with abdominal massage to reduce constipation Baseline:  Goal status: INITIAL  5.  Pt will add at least 25 grams of fiber to her diet daily to improve constipation Baseline:  Goal status: INITIAL    LONG TERM GOALS: Target date: 11/05/2023    Pt will have reduced low back pain to max 1/10 in order to be able to do functional activities such as bending, lifting, twisting and walking as needed to be able to take care of her family and participate in community activities and travel.  Baseline: 7/10 Goal status: INITIAL  2.  Pt will be able to have a bowel movement within 2-3 minutes so she is not straining and adding pressure on the pelvic floor due to the ability to lengthen the tissue and generate  pressure to push the stool out  Baseline:  Goal status: INITIAL  3.  Pt will soak 0 pads/ day in order to run errands and not have to interrupt daily tasks  or exercise d/t fecal  or urinary leakage  Baseline:  Goal status: INITIAL  4.  Pt will demonstrate at least 20 point improvement in PFIQ-7 score in order to show functional improvement in urinary incontinence and constipation Baseline: 76 Goal status: INITIAL  5.  Pt will have at least 5 bowel movements/ week and report complete rectum emptying with type 3-4 stool Baseline: Bristol stool scale type 7 with linzess  Goal status: INITIAL  6.  Pt will get up max 1 time/ night to urinate so she can get better rest Baseline: 1-2 Goal status: INITIAL  PLAN:  PT FREQUENCY: 1-2x/week  PT DURATION: 12 weeks  PLANNED INTERVENTIONS: 97110-Therapeutic exercises, 97530- Therapeutic activity, 97112- Neuromuscular re-education, 97535- Self Care, 16109- Manual therapy, [phone]- Aquatic Therapy, [phone]- Electrical stimulation (manual), Patient/Family education, Taping, Joint mobilization, Joint manipulation, Spinal manipulation, Spinal mobilization, Scar mobilization, Cryotherapy, Moist heat, and Biofeedback  PLAN FOR NEXT SESSION: internal pelvic floor muscle assessment, review exercise routine, gym exercises, abdominal massage   Rolen Conger, PT 08/13/2023, 12:16 PM

## 2023-08-28 ENCOUNTER — Encounter: Payer: Self-pay | Admitting: Physical Therapy

## 2023-08-28 ENCOUNTER — Ambulatory Visit: Admitting: Physical Therapy

## 2023-08-28 DIAGNOSIS — R278 Other lack of coordination: Secondary | ICD-10-CM | POA: Diagnosis not present

## 2023-08-28 NOTE — Therapy (Signed)
 OUTPATIENT PHYSICAL THERAPY FEMALE PELVIC TREATMENT   Patient Name: Crystal Wong MRN: [id number] DOB:Jun 01, 1955, 68 y.o., female Today's Date: 08/28/2023  END OF SESSION:  PT End of Session - 08/28/23 1239     Visit Number 2    Date for PT Re-Evaluation 11/13/23    Authorization Type [employer] MEDICARE    Authorization Time Period 16 Therapy Visit(s) from 08/13/2023 to 10/08/2023    PT Start Time 1230    PT Stop Time 1315    PT Time Calculation (min) 45 min    Activity Tolerance Patient tolerated treatment well    Behavior During Therapy WFL for tasks assessed/performed           Past Medical History:  Diagnosis Date   Anxiety    Asthma    Depression    GERD (gastroesophageal reflux disease)    Heart murmur    Hyperlipidemia    Insomnia    Past Surgical History:  Procedure Laterality Date   71 HOUR PH STUDY N/A 12/27/2022   Procedure: 24 HOUR PH STUDY;  Surgeon: Charlanne Groom, MD;  Location: [facility];  Service: Gastroenterology;  Laterality: N/A;  with impectence   ANKLE FRACTURE SURGERY Left    Pins and screws   BREAST LUMPECTOMY Bilateral    left x 1, right x 2   CARPAL TUNNEL RELEASE Right    CARPAL TUNNEL RELEASE Left    COLONOSCOPY     ESOPHAGEAL MANOMETRY N/A 12/27/2022   Procedure: ESOPHAGEAL MANOMETRY (EM);  Surgeon: Charlanne Groom, MD;  Location: [facility];  Service: Gastroenterology;  Laterality: N/A;   KNEE ARTHROSCOPY Bilateral    PARTIAL HYSTERECTOMY     Patient Active Problem List   Diagnosis Date Noted   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   Chronic cough 10/25/2022   Constipation 08/07/2022   Diabetes mellitus treated with injections of non-insulin medication (HCC) 07/11/2022   Cough variant asthma 04/24/2016   BMI 33.0-33.9,adult 03/02/2016   Mixed hyperlipidemia 08/23/2012   Insomnia 08/23/2012   GERD (gastroesophageal reflux disease) 08/23/2012   Depression 08/23/2012   GAD (generalized anxiety disorder) 08/23/2012    PCP:  Gladis Mustard, FNP  REFERRING PROVIDER: Craig Alan SAUNDERS, PA-C  REFERRING DIAG: K59.00 (ICD-10-CM) - Constipation, unspecified constipation type R35.1 (ICD-10-CM) - Nocturia  THERAPY DIAG:  Other lack of coordination  Rationale for Evaluation and Treatment: Rehabilitation  ONSET DATE: 3 years ago- 2022, but started longer time ago in 1990-1992  SUBJECTIVE:  SUBJECTIVE STATEMENT: Pt reports that kiwi helped, she is doing massaging and deep breathing, has knee issues, and left knee pain, has been able to to floor exercises. Her left knee pain is up to 8/10. [facility] dr ( Dr Sharl) recommended injections- last one lasted 10 days, she goes every 3 months, she is taking celebrex, pt has never had PT for knee before. Pain can be horrible.  Digesting corn takes about 3 days.  Stools became firmer with kiwi, less runny.  Still taking Linzess  and miralax every day She is having a bowel movement every day now Meat disgusts her Has vaginal dryness, [place] jelly was expired.  Will ask about vaginal estrogen [place] here.  Uses a squatty potty, has a bidet   Fluid intake: water- at least 5-6 glasses, likes a couple diet pepsis/ day  PAIN:  Are you having pain? Yes abdominal pain when she gets backed up, wer back pain, cannot sleep NPRS scale: 7/10 low back pain when it is bad Pain location: low back  Pain type: aching Pain description: when constipated   Aggravating factors: constipation Relieving factors: having BMs  PRECAUTIONS: None  RED FLAGS: None   WEIGHT BEARING RESTRICTIONS: No  FALLS:  Has patient fallen in last 6 months? No  OCCUPATION: retired  ACTIVITY LEVEL : exercises on yoga mat, [facility], walks, pelvic  lifts 100 reps, crunches 150  PLOF: Independent  PATIENT GOALS: not to take anything for constipation  PERTINENT HISTORY:  Hysterectomy Sexual abuse: No  BOWEL MOVEMENT: Pain with bowel movement: Yes sometimes her hemorrhoids bother her, no pain with Linzess  Type of bowel movement:Type (Bristol Stool Scale) 7 with Linzess  Fully empty rectum: No Leakage: Yes: a little bit, none with type 1  Pads: No Fiber supplement/laxative yes  URINATION: Pain with urination: No Fully empty bladder: No Stream: Strong Urgency: Yes 30 mins- 2 hrs Frequency: 1/ hr Leakage: Coughing and jumping Pads: No period panties  INTERCOURSE:  Ability to have vaginal penetration Yes  Pain with intercourse: none DrynessYes - not using vaginal estrogen Climax: yes Marinoff Scale: 0/3 Laxative: yes  PREGNANCY: 2 very large babies Vaginal deliveries 2 Tearing No Episiotomy Yes  C-section deliveries 0 Currently pregnant No  PROLAPSE: None   OBJECTIVE:  Note: Objective measures were completed at Evaluation unless otherwise noted.   PATIENT SURVEYS:    PFIQ-7: 70  COGNITION: Overall cognitive status: Within functional limits for tasks assessed     SENSATION: Light touch: Appears intact  LUMBAR SPECIAL TESTS:  Single leg stance test: Positive for valgus knee bilat   POSTURE: rounded shoulders and forward head   LUMBARAROM/PROM: full   LOWER EXTREMITY ROM: full   LOWER EXTREMITY MMT: 4/5 overall  PALPATION:   General: upper chest breathing, elevated shoulders, shallow breathing, decreased involvement of respiratory diaphragm   Pelvic Alignment: even  Abdominal: tenderness and restrictions throughout                External Perineal Exam: deferred                              Internal Pelvic Floor: deferred   Patient confirms identification and approves PT to assess internal pelvic floor and treatment Yes  PELVIC MMT:   MMT eval  Vaginal   Internal Anal Sphincter    External Anal Sphincter   Puborectalis  Diastasis Recti 1 finger throughout, firm feel  (Blank rows = not tested)        TONE: Deferred   PROLAPSE: Deferred   TODAY'S TREATMENT:                                                                                                                              DATE: 08/13/2023 Abdominal massage- reviewed and performed Open books with diaphragmatic breathing  Child's pose Reviewed progress and HEP    EVAL see below   PATIENT EDUCATION/ there act: Education details: Pt was educated on relevant anatomy, exam findings, HEP, expectations of PT   Person educated: Patient Education method: Explanation, Demonstration, Tactile cues, Verbal cues, and Handouts Education comprehension: verbalized understanding, returned demonstration, verbal cues required, tactile cues required, and needs further education  HOME EXERCISE PROGRAM: [id number]  ASSESSMENT:  CLINICAL IMPRESSION: Pt reports that she is improving, would like to get off laxatives, it is horrible, she will talk to her GI doctor how to titrate her laxatives. Reports that she is improving, has had difficulty with ambulation d/t knee pain but lives 50 mins away, will look for PT near her. Vaginal moisturizers samples given. Her vaginal tissues feel dry to her and she will ask her GYN about it as well. Wants to postpone internal and focus on constipation for now.  She will continue to benefit from PT to address deficits    From eval:  Patient is a 68 y.o. F who was seen today for physical therapy evaluation and treatment for constipation and stress urinary incontinence.  She reported increased urinary urgency and frequency at times as well, wears period underwear and is frustrated. Reported vaginal dryness with intercourse.She has had constipation since 1990 after her hysterectomy, recently started working with a GI doctor. Taking Mira lax and linzess  on daily basis. [facility] and [facility] but has to avoid even low impact exercises d/t stress urinary incontinence. At times has back pain up to 7/10 d/t constipation. Pt with upper chest breathing, breath holding tendencies, tenderness throughout abdomen, some bilateral knee weakness present, good hip and trunk mobility. Internal exam deferred to next visit. Pt motivated to participate in PT to achieve goals. She was educated on Physical therapy bowel recommendations today, will get a squatty potty, increase fiber in her diet, discussed reducing dairy and adding kiwi- pt is worried about too much sugar d/t type 2 diabetes. Pt is on Ozempic  and has new food aversions to meat. Not using vaginal estrogen. She will benefit from PT to achieve goals.   OBJECTIVE IMPAIRMENTS: decreased coordination, decreased knowledge of condition, decreased strength, improper body mechanics, and pain.   ACTIVITY LIMITATIONS: continence and toileting  PARTICIPATION LIMITATIONS: interpersonal relationship, driving, and community activity  PERSONAL FACTORS: Time since onset of injury/illness/exacerbation are also affecting patient's functional outcome.   REHAB POTENTIAL: Good  CLINICAL DECISION MAKING: Evolving/moderate complexity  EVALUATION COMPLEXITY: Moderate   GOALS:  Goals reviewed with patient? Yes  SHORT TERM GOALS: Target date: 09/10/2023    Pt will be independent with HEP.   Baseline: Goal status: INITIAL  2.  Pt will be independent with use of squatty potty, relaxed toileting mechanics, and improved bowel movement techniques in order to increase ease of bowel movements and complete evacuation.   Baseline:  Goal status: INITIAL  3.  Pt will be independent with the knack, urge suppression technique, and double voiding in order to improve bladder habits and decrease urinary incontinence.   Baseline:  Goal status: INITIAL  4.  Pt will be I with abdominal massage to reduce constipation Baseline:  Goal status: INITIAL  5.   Pt will add at least 25 grams of fiber to her diet daily to improve constipation Baseline:  Goal status: INITIAL    LONG TERM GOALS: Target date: 11/05/2023    Pt will have reduced low back pain to max 1/10 in order to be able to do functional activities such as bending, lifting, twisting and walking as needed to be able to take care of her family and participate in community activities and travel.  Baseline: 7/10 Goal status: INITIAL  2.  Pt will be able to have a bowel movement within 2-3 minutes so she is not straining and adding pressure on the pelvic floor due to the ability to lengthen the tissue and generate  pressure to push the stool out  Baseline:  Goal status: INITIAL  3.  Pt will soak 0 pads/ day in order to run errands and not have to interrupt daily tasks or exercise d/t fecal or urinary leakage  Baseline:  Goal status: INITIAL  4.  Pt will demonstrate at least 20 point improvement in PFIQ-7 score in order to show functional improvement in urinary incontinence and constipation Baseline: 76 Goal status: INITIAL  5.  Pt will have at least 5 bowel movements/ week and report complete rectum emptying with type 3-4 stool Baseline: Bristol stool scale type 7 with linzess  Goal status: INITIAL  6.  Pt will get up max 1 time/ night to urinate so she can get better rest Baseline: 1-2 Goal status: INITIAL  PLAN:  PT FREQUENCY: 1-2x/week  PT DURATION: 12 weeks  PLANNED INTERVENTIONS: 97110-Therapeutic exercises, 97530- Therapeutic activity, 97112- Neuromuscular re-education, 97535- Self Care, [id number]- Manual therapy, [phone]- Aquatic Therapy, [phone]- Electrical stimulation (manual), Patient/Family education, Taping, Joint mobilization, Joint manipulation, Spinal manipulation, Spinal mobilization, Scar mobilization, Cryotherapy, Moist heat, and Biofeedback  PLAN FOR NEXT SESSION: internal pelvic floor muscle assessment, review exercise routine, gym exercises, abdominal  massage   Ottis Vacha, PT 08/28/2023, 1:08 PM

## 2023-09-06 ENCOUNTER — Ambulatory Visit: Admitting: Physician Assistant

## 2023-09-06 ENCOUNTER — Encounter: Payer: Self-pay | Admitting: Physician Assistant

## 2023-09-06 VITALS — BP 122/62 | HR 80 | Ht 66.0 in | Wt 160.0 lb

## 2023-09-06 DIAGNOSIS — K219 Gastro-esophageal reflux disease without esophagitis: Secondary | ICD-10-CM

## 2023-09-06 DIAGNOSIS — K2100 Gastro-esophageal reflux disease with esophagitis, without bleeding: Secondary | ICD-10-CM

## 2023-09-06 DIAGNOSIS — Z7985 Long-term (current) use of injectable non-insulin antidiabetic drugs: Secondary | ICD-10-CM | POA: Diagnosis not present

## 2023-09-06 DIAGNOSIS — E119 Type 2 diabetes mellitus without complications: Secondary | ICD-10-CM | POA: Diagnosis not present

## 2023-09-06 DIAGNOSIS — K224 Dyskinesia of esophagus: Secondary | ICD-10-CM | POA: Diagnosis not present

## 2023-09-06 DIAGNOSIS — K649 Unspecified hemorrhoids: Secondary | ICD-10-CM

## 2023-09-06 DIAGNOSIS — Z800 Family history of malignant neoplasm of digestive organs: Secondary | ICD-10-CM | POA: Diagnosis not present

## 2023-09-06 DIAGNOSIS — K5900 Constipation, unspecified: Secondary | ICD-10-CM

## 2023-09-06 NOTE — Progress Notes (Signed)
 09/06/2023 Jenkins Crystal Wong Borer [id number] [birth year]  Referring provider: Gladis Mustard, * Primary GI doctor: Dr. Charlanne  ASSESSMENT AND PLAN:   GERD with history of hypercontractile esophagus EGD 06/09/2019 normal esophagus biopsy showed reflux negative Barrett's and EOE, mild chronic gastritis negative H. pylori normal duodenum 24-hour pH/manometry 12/27/2022 normal acid exposure good acid suppression on PPIs, normal esophageal exposure in upright supine position positive symptom correlation with regurgitation, esophageal manometry normal relaxation of EG junction no achalasia,  jackhammer esophagus  On pantoprazole  20 mg twice daily and Pepcid  as needed Possibly worsened with Ozempic  use Unable to tolerate Cardizem  or amitriptyline. - has done well with Altoid mints/IBgard/Levsin  - has not had any esophageal spasm ,GERD well controlled - she is eating several small meals a day that has helped - given gastroparesis diet - recall 1 year  Constipation Coorelates with increase in ozempic  dose -Getting pelvic PT, has had two sessions with help -Continue linzess  290 with miralax as needed  Colon cancer screening, family history of colon cancer in father Colonoscopy 2017, father with colon cancer age 30 Negative Cologuard January 2023, recall January 2026 Discussed with patient, with family history she is willing to proceed with colonoscopy when she is due, will set up recall for direct colonoscopy  Hemorrhioid FOBT negative, large external hemorrhoidal skin tags on exam with large right posterior inflamed internal hemorrhoid Improved after suppositories and bowel habit changes declines  Type 2 diabetes On Ozempic  2021 Cardiac calcium  score of 0   Patient Care Team: Gladis Mustard, FNP as PCP - General (Family Medicine) Kate Lonni CROME, MD as PCP - Cardiology (Cardiology) Livingston Rigg, MD (Inactive) as Consulting Physician (Dermatology)  HISTORY OF  PRESENT ILLNESS: 68 y.o. female with a past medical history listed below presents for evaluation of GERD.   Patient last seen 06/11/2023 by myself in the office for constipation and GERD with hypercontractile esophagus. Discussed the use of AI scribe software for clinical note transcription with the patient, who gave verbal consent to proceed.  History of Present Illness   Crystal Wong is a 68 year old female with constipation and GERD who presents for follow-up of her gastrointestinal issues.  She has experienced improvement in her hemorrhoids after using suppositories prescribed in April. Constipation remains an issue, and she has attended two therapy sessions focusing on exercises and routine changes, including using a squatty potty. She is currently on Linzess  and Miralax, with a reduced dose of Miralax from one and a half doses to one dose per day. Dietary changes, such as eating two kiwis a day, have helped firm up her stool.  Her GERD symptoms are well-controlled with pantoprazole  twice a day and Pepcid  as needed. She has not experienced any chest spasms recently. She has adjusted her eating habits to several small meals a day to prevent nighttime GERD symptoms. She sometimes feels full quickly but manages by eating multiple times throughout the day.  She has a history of type 2 diabetes and is currently on Ozempic , which has affected her appetite, particularly her desire to eat meat. She eats meat once or twice a week without adverse reactions such as diarrhea or abdominal pain. She was bitten by ticks two months ago and received doxycycline  to prevent Lyme disease.  Her father had colon cancer at age 21. She had a colonoscopy in 2017 and a Cologuard test in January 2023.      She  reports that she has never smoked. She has never used  smokeless tobacco. She reports that she does not currently use alcohol. She reports that she does not use drugs.  RELEVANT GI HISTORY, IMAGING AND  LABS: Results   DIAGNOSTIC Colonoscopy: Negative (2017) Cologuard: Negative (03/2021)      CBC    Component Value Date/Time   WBC 6.1 03/13/2023 1218   WBC 4.4 12/06/2022 1950   RBC 4.62 03/13/2023 1218   RBC 4.54 12/06/2022 1950   HGB 13.7 03/13/2023 1218   HCT 41.8 03/13/2023 1218   PLT 247 03/13/2023 1218   MCV 91 03/13/2023 1218   MCH 29.7 03/13/2023 1218   MCH 30.0 12/06/2022 1950   MCHC 32.8 03/13/2023 1218   MCHC 34.2 12/06/2022 1950   RDW 12.9 03/13/2023 1218   LYMPHSABS 1.7 03/13/2023 1218   MONOABS 0.4 10/04/2021 2158   EOSABS 0.1 03/13/2023 1218   BASOSABS 0.1 03/13/2023 1218   Recent Labs    12/06/22 1950 03/13/23 1218  HGB 13.6 13.7    CMP     Component Value Date/Time   NA 142 03/13/2023 1218   K 4.6 03/13/2023 1218   CL 104 03/13/2023 1218   CO2 24 03/13/2023 1218   GLUCOSE 110 (H) 03/13/2023 1218   GLUCOSE 94 12/06/2022 1950   BUN 17 03/13/2023 1218   CREATININE 1.08 (H) 03/13/2023 1218   CREATININE 0.84 08/23/2012 1155   CALCIUM  9.7 03/13/2023 1218   PROT 6.6 03/13/2023 1218   ALBUMIN 4.3 03/13/2023 1218   AST 20 03/13/2023 1218   ALT 17 03/13/2023 1218   ALKPHOS 91 03/13/2023 1218   BILITOT 0.5 03/13/2023 1218   GFRNONAA >60 12/06/2022 1950   GFRNONAA 79 08/23/2012 1155   GFRAA 83 03/02/2020 1030   GFRAA >89 08/23/2012 1155      Latest Ref Rng & Units 03/13/2023   12:18 PM 09/05/2022   11:34 AM 03/07/2022   12:29 PM  Hepatic Function  Total Protein 6.0 - 8.5 g/dL 6.6  6.6  6.9   Albumin 3.9 - 4.9 g/dL 4.3  4.5  4.4   AST 0 - 40 IU/L 20  21  21    ALT 0 - 32 IU/L 17  19  19    Alk Phosphatase 44 - 121 IU/L 91  82  80   Total Bilirubin 0.0 - 1.2 mg/dL 0.5  0.4  0.3       Current Medications:   Current Outpatient Medications (Endocrine & Metabolic):    Semaglutide , 1 MG/DOSE, (OZEMPIC , 1 MG/DOSE,) 4 MG/3ML SOPN, INJECT 1 MG ONCE A WEEK AS DIRECTED  Current Outpatient Medications (Cardiovascular):    Evolocumab  (REPATHA  SURECLICK)  140 MG/ML SOAJ, INJECT 140 MG INTO THE SKIN EVERY 14 (FOURTEEN) DAYS.   rosuvastatin  (CRESTOR ) 40 MG tablet, Take 1 tablet (40 mg total) by mouth daily.  Current Outpatient Medications (Respiratory):    albuterol  (VENTOLIN  HFA) 108 (90 Base) MCG/ACT inhaler, 2 puff q6 prn   budesonide -formoterol  (SYMBICORT ) 80-4.5 MCG/ACT inhaler, Take 2 puffs first thing in am and then another 2 puffs about 12 hours later.   fluticasone  (FLONASE ) 50 MCG/ACT nasal spray, Place 2 sprays into both nostrils daily.   LORATADINE -D 24HR 10-240 MG 24 hr tablet, Take 1 tablet by mouth once daily  Current Outpatient Medications (Analgesics):    celecoxib (CELEBREX) 100 MG capsule, Take 100 mg by mouth daily.   Current Outpatient Medications (Other):    dutasteride (AVODART) 0.5 MG capsule, Take 0.5 mg by mouth daily.   escitalopram  (LEXAPRO ) 20 MG tablet, Take 1  tablet (20 mg total) by mouth daily.   famotidine  (PEPCID ) 20 MG tablet, Take 1 tablet (20 mg total) by mouth 2 (two) times daily.   hydrocortisone  (ANUSOL -HC) 2.5 % rectal cream, Place 1 Application rectally 2 (two) times daily.   hydrocortisone  (ANUSOL -HC) 25 MG suppository, Place 1 suppository (25 mg total) rectally 2 (two) times daily.   hyoscyamine  (LEVSIN  SL) 0.125 MG SL tablet, TAKE 1 TABLET (0.125 MG TOTAL) BY MOUTH EVERY 6 (SIX) HOURS AS NEEDED.   linaclotide  (LINZESS ) 290 MCG CAPS capsule, Take 1 capsule (290 mcg total) by mouth daily before breakfast.   pantoprazole  (PROTONIX ) 20 MG tablet, Take 1 tablet (20 mg total) by mouth 2 (two) times daily.   VITAMIN D , CHOLECALCIFEROL, PO, Take 1 tablet by mouth daily.   Medical History:  Past Medical History:  Diagnosis Date   Anxiety    Asthma    Depression    GERD (gastroesophageal reflux disease)    Heart murmur    Hyperlipidemia    Insomnia    Allergies:  Allergies  Allergen Reactions   Sulfa Antibiotics Anaphylaxis   Sulfamethoxazole-Trimethoprim Swelling   Elemental Sulfur Other (See  Comments)    Other reaction(s): Angioedema (ALLERGY/intolerance) Other reaction(s): Angioedema (ALLERGY/intolerance)     Surgical History:  She  has a past surgical history that includes Partial hysterectomy; Ankle fracture surgery (Left); Knee arthroscopy (Bilateral); Breast lumpectomy (Bilateral); Carpal tunnel release (Right); Colonoscopy; Carpal tunnel release (Left); Esophageal manometry (N/A, 12/27/2022); and 24 hour ph study (N/A, 12/27/2022). Family History:  Her family history includes Colon cancer (age of onset: 73) in her father; Colon polyps in her mother; Diabetes in her mother; Gallbladder disease in her daughter; Gallstones in her paternal grandmother; Heart disease in her mother.  REVIEW OF SYSTEMS  : All other systems reviewed and negative except where noted in the History of Present Illness.  PHYSICAL EXAM: BP 122/62   Pulse 80   Ht 5' 6 (1.676 m)   Wt 160 lb (72.6 kg)   BMI 25.82 kg/m  General Appearance: Well nourished, in no apparent distress. Respiratory: Respiratory effort normal, BS equal bilaterally without rales, rhonchi, wheezing. Cardio: RRR with no MRGs. Abdomen: Soft,  Obese ,active bowel sounds. No tenderness. Without guarding and Without rebound. No masses. Rectal: declines Musculoskeletal: Full ROM, Normal gait Neuro: Alert and  oriented x4;  No focal deficits. Psych:  Cooperative. Normal mood and affect.    Alan JONELLE Coombs, PA-C 3:12 PM

## 2023-09-06 NOTE — Patient Instructions (Addendum)
 _______________________________________________________  If your blood pressure at your visit was 140/90 or greater, please contact your primary care physician to follow up on this.  _______________________________________________________  If you are age 66 or older, your body mass index should be between 23-30. Your Body mass index is 25.82 kg/m. If this is out of the aforementioned range listed, please consider follow up with your Primary Care Provider.  If you are age 35 or younger, your body mass index should be between 19-25. Your Body mass index is 25.82 kg/m. If this is out of the aformentioned range listed, please consider follow up with your Primary Care Provider.   ________________________________________________________  The [facility] providers would like to encourage you to use MYCHART to communicate with providers for non-urgent requests or questions.  Due to long hold times on the telephone, sending your provider a message by [facility] may be a faster and more efficient way to get a response.  Please allow 48 business hours for a response.  Please remember that this is for non-urgent requests.  _______________________________________________________  Please call in November to see about scheduling a colonoscopy and a previsit for January   Gastroparesis Gastroparesis is a condition in which food takes longer than normal to empty from the stomach.  This condition is also known as delayed gastric emptying. It is usually a long-term (chronic) condition.  What are the signs or symptoms? Symptoms of this condition include: Feeling full after eating very little or a loss of appetite. Nausea, vomiting, or heartburn. Bloating of your abdomen. Inconsistent blood sugar (glucose) levels on blood tests. Unexplained weight loss. Acid from the stomach coming up into the esophagus (gastroesophageal reflux). Sudden tightening (spasm) of the stomach, which can be painful. Symptoms may  come and go. Some people may not notice any symptoms.  What increases the risk? You are more likely to develop this condition if: You have certain disorders or diseases. These may include: An endocrine disorder. An eating disorder. Amyloidosis. Scleroderma. Parkinson's disease. Multiple sclerosis. Cancer or infection of the stomach or the vagus nerve. You have had surgery on your stomach or vagus nerve. You take certain medicines. You are female.  Things you can do: Please do small frequent meals like 4-6 meals a day.  Eat and drink liquids at separate times.  Avoid high fiber foods, cook your vegetables, avoid high fat food.  Suggest spreading protein throughout the day (greek yogurt, glucerna, soft meat, milk, eggs) Choose soft foods that you can mash with a fork When you are more symptomatic, change to pureed foods foods and liquids.  Consider reading Living well with Gastroparesis by [facility]  Check out this link to a diet online [email or web address]

## 2023-09-11 ENCOUNTER — Ambulatory Visit (INDEPENDENT_AMBULATORY_CARE_PROVIDER_SITE_OTHER): Payer: Medicare Other | Admitting: Nurse Practitioner

## 2023-09-11 ENCOUNTER — Encounter: Payer: Self-pay | Admitting: Nurse Practitioner

## 2023-09-11 VITALS — BP 104/64 | HR 78 | Temp 98.0°F | Ht 66.0 in | Wt 157.0 lb

## 2023-09-11 DIAGNOSIS — Z6833 Body mass index (BMI) 33.0-33.9, adult: Secondary | ICD-10-CM

## 2023-09-11 DIAGNOSIS — E782 Mixed hyperlipidemia: Secondary | ICD-10-CM | POA: Diagnosis not present

## 2023-09-11 DIAGNOSIS — F3342 Major depressive disorder, recurrent, in full remission: Secondary | ICD-10-CM

## 2023-09-11 DIAGNOSIS — F411 Generalized anxiety disorder: Secondary | ICD-10-CM

## 2023-09-11 DIAGNOSIS — F5101 Primary insomnia: Secondary | ICD-10-CM

## 2023-09-11 DIAGNOSIS — K5901 Slow transit constipation: Secondary | ICD-10-CM

## 2023-09-11 DIAGNOSIS — Z7985 Long-term (current) use of injectable non-insulin antidiabetic drugs: Secondary | ICD-10-CM

## 2023-09-11 DIAGNOSIS — K2100 Gastro-esophageal reflux disease with esophagitis, without bleeding: Secondary | ICD-10-CM

## 2023-09-11 DIAGNOSIS — J45991 Cough variant asthma: Secondary | ICD-10-CM

## 2023-09-11 DIAGNOSIS — E119 Type 2 diabetes mellitus without complications: Secondary | ICD-10-CM | POA: Diagnosis not present

## 2023-09-11 LAB — BAYER DCA HB A1C WAIVED: HB A1C (BAYER DCA - WAIVED): 5.4 % (ref 4.8–5.6)

## 2023-09-11 LAB — LIPID PANEL

## 2023-09-11 MED ORDER — LINACLOTIDE 290 MCG PO CAPS: 290.0000 ug | ORAL_CAPSULE | Freq: Every day | ORAL | 5 refills | Status: DC

## 2023-09-11 MED ORDER — BUDESONIDE-FORMOTEROL FUMARATE 80-4.5 MCG/ACT IN AERO: INHALATION_SPRAY | RESPIRATORY_TRACT | 12 refills | Status: AC

## 2023-09-11 MED ORDER — PANTOPRAZOLE SODIUM 20 MG PO TBEC: 20.0000 mg | DELAYED_RELEASE_TABLET | Freq: Two times a day (BID) | ORAL | 1 refills | Status: DC

## 2023-09-11 MED ORDER — ESCITALOPRAM OXALATE 20 MG PO TABS: 20.0000 mg | ORAL_TABLET | Freq: Every day | ORAL | 1 refills | Status: DC

## 2023-09-11 MED ORDER — FAMOTIDINE 20 MG PO TABS: 20.0000 mg | ORAL_TABLET | Freq: Two times a day (BID) | ORAL | 1 refills | Status: DC

## 2023-09-11 MED ORDER — ROSUVASTATIN CALCIUM 40 MG PO TABS: 40.0000 mg | ORAL_TABLET | Freq: Every day | ORAL | 1 refills | Status: DC

## 2023-09-11 MED ORDER — OZEMPIC (1 MG/DOSE) 4 MG/3ML ~~LOC~~ SOPN: 1.0000 mg | PEN_INJECTOR | SUBCUTANEOUS | 1 refills | Status: DC

## 2023-09-11 NOTE — Progress Notes (Signed)
 Subjective:    Patient ID: Crystal Wong, female    DOB: 07-Sep-1955, 68 y.o.   MRN: [id number]   Chief Complaint: medical management of chronic issues     HPI:  Crystal Wong is a 68 y.o. who identifies as a female who was assigned female at birth.   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 Mixed hyperlipidemia Does try to watch diet. Does no dedicated exercise Lab Results  Component Value Date   CHOL 165 03/13/2023   HDL 57 03/13/2023   LDLCALC 91 03/13/2023   TRIG 95 03/13/2023   CHOLHDL 2.9 03/13/2023   The 10-year ASCVD risk score (Arnett DK, et al., 2019) is: 7.1%   2. Diabetes mellitus without complication (HCC) Fasting blood sugars are running around 100-130. Lab Results  Component Value Date   HGBA1C 5.3 03/13/2023     3. Long-term current use of injectable noninsulin antidiabetic medication Is on ozempic  weekly. She does not check her blood sugars very often  4. Gastroesophageal reflux disease with esophagitis without hemorrhage Takes protonix  daily. Will still have occasional symptoms  5. Cough variant asthma Chronic. Is on symbicort  daily. Denies SOB now. Had covid several months ago and had lots of SOB. Has resolved.  6. Slow transit constipation Is on linzess  which has really helped  7. Recurrent major depressive disorder, in full remission (HCC) Is on celexa  daily and is doing well.    06/05/2023   12:14 PM 03/13/2023   11:36 AM 01/23/2023    8:08 AM  Depression screen PHQ 2/9  Decreased Interest 0 0 0  Down, Depressed, Hopeless 0 0 0  PHQ - 2 Score 0 0 0  Altered sleeping  1 0  Tired, decreased energy  1 0  Change in appetite  0 0  Feeling bad or failure about yourself   0 0  Trouble concentrating  0 0  Moving slowly or fidgety/restless  0 0  Suicidal thoughts  0 0  PHQ-9 Score  2 0       8. GAD (generalized anxiety  disorder) Is on ativan  daily    03/13/2023   11:37 AM 01/23/2023    8:09 AM 09/05/2022   11:23 AM 07/11/2022    9:21 AM  GAD 7 : Generalized Anxiety Score  Nervous, Anxious, on Edge 0 1 0 0  Control/stop worrying 0 0 0 0  Worry too much - different things 0 0 0 0  Trouble relaxing 0 0 0 0  Restless 0 0 0 0  Easily annoyed or irritable 0 1 0 2  Afraid - awful might happen 0 0 0 0  Total GAD 7 Score 0 2 0 2  Anxiety Difficulty Not difficult at all Somewhat difficult Not difficult at all Not difficult at all         9. Primary insomnia Sleeps well if she waits and takes her xanax at night  10. BMI 33.0-33.9,adult Weight is down 3lbs  Wt Readings from Last 3 Encounters:  09/11/23 157 lb (71.2 kg)  09/06/23 160 lb (72.6 kg)  07/06/23 161 lb 6.4 oz (73.2 kg)   BMI Readings from Last 3 Encounters:  09/11/23 25.34 kg/m  09/06/23 25.82 kg/m  07/06/23 26.05 kg/m        New complaints: None today  Allergies  Allergen Reactions   Sulfa Antibiotics Anaphylaxis   Sulfamethoxazole-Trimethoprim Swelling  Elemental Sulfur Other (See Comments)    Other reaction(s): Angioedema (ALLERGY/intolerance) Other reaction(s): Angioedema (ALLERGY/intolerance)   Outpatient Encounter Medications as of 09/11/2023  Medication Sig   albuterol  (VENTOLIN  HFA) 108 (90 Base) MCG/ACT inhaler 2 puff q6 prn   budesonide -formoterol  (SYMBICORT ) 80-4.5 MCG/ACT inhaler Take 2 puffs first thing in am and then another 2 puffs about 12 hours later.   celecoxib (CELEBREX) 100 MG capsule Take 100 mg by mouth daily.   dutasteride (AVODART) 0.5 MG capsule Take 0.5 mg by mouth daily.   escitalopram  (LEXAPRO ) 20 MG tablet Take 1 tablet (20 mg total) by mouth daily.   Evolocumab  (REPATHA  SURECLICK) 140 MG/ML SOAJ INJECT 140 MG INTO THE SKIN EVERY 14 (FOURTEEN) DAYS.   famotidine  (PEPCID ) 20 MG tablet Take 1 tablet (20 mg total) by mouth 2 (two) times daily.   fluticasone  (FLONASE ) 50 MCG/ACT nasal spray Place  2 sprays into both nostrils daily.   hydrocortisone  (ANUSOL -HC) 2.5 % rectal cream Place 1 Application rectally 2 (two) times daily.   hydrocortisone  (ANUSOL -HC) 25 MG suppository Place 1 suppository (25 mg total) rectally 2 (two) times daily.   hyoscyamine  (LEVSIN  SL) 0.125 MG SL tablet TAKE 1 TABLET (0.125 MG TOTAL) BY MOUTH EVERY 6 (SIX) HOURS AS NEEDED.   linaclotide  (LINZESS ) 290 MCG CAPS capsule Take 1 capsule (290 mcg total) by mouth daily before breakfast.   LORATADINE -D 24HR 10-240 MG 24 hr tablet Take 1 tablet by mouth once daily   pantoprazole  (PROTONIX ) 20 MG tablet Take 1 tablet (20 mg total) by mouth 2 (two) times daily.   rosuvastatin  (CRESTOR ) 40 MG tablet Take 1 tablet (40 mg total) by mouth daily.   Semaglutide , 1 MG/DOSE, (OZEMPIC , 1 MG/DOSE,) 4 MG/3ML SOPN INJECT 1 MG ONCE A WEEK AS DIRECTED   VITAMIN D , CHOLECALCIFEROL, PO Take 1 tablet by mouth daily.    No facility-administered encounter medications on file as of 09/11/2023.    Past Surgical History:  Procedure Laterality Date   3 HOUR PH STUDY N/A 12/27/2022   Procedure: 24 HOUR PH STUDY;  Surgeon: Charlanne Groom, MD;  Location: [facility];  Service: Gastroenterology;  Laterality: N/A;  with impectence   ANKLE FRACTURE SURGERY Left    Pins and screws   BREAST LUMPECTOMY Bilateral    left x 1, right x 2   CARPAL TUNNEL RELEASE Right    CARPAL TUNNEL RELEASE Left    COLONOSCOPY     ESOPHAGEAL MANOMETRY N/A 12/27/2022   Procedure: ESOPHAGEAL MANOMETRY (EM);  Surgeon: Charlanne Groom, MD;  Location: [facility];  Service: Gastroenterology;  Laterality: N/A;   KNEE ARTHROSCOPY Bilateral    PARTIAL HYSTERECTOMY      Family History  Problem Relation Age of Onset   Heart disease Mother    Diabetes Mother    Colon polyps Mother    Colon cancer Father 32   Gallbladder disease Daughter    Gallstones Paternal Grandmother    Esophageal cancer Neg Hx    Stomach cancer Neg Hx    Rectal cancer Neg Hx    Breast cancer  Neg Hx       Controlled substance contract: 03/08/22     Review of Systems  Constitutional:  Negative for diaphoresis.  Eyes:  Negative for pain.  Respiratory:  Negative for shortness of breath.   Cardiovascular:  Negative for chest pain, palpitations and leg swelling.  Gastrointestinal:  Negative for abdominal pain.  Endocrine: Negative for polydipsia.  Skin:  Negative for rash.  Neurological:  Negative for dizziness, weakness and headaches.  Hematological:  Does not bruise/bleed easily.  All other systems reviewed and are negative.      Objective:   Physical Exam Vitals and nursing note reviewed.  Constitutional:      General: She is not in acute distress.    Appearance: Normal appearance. She is well-developed.  HENT:     Head: Normocephalic.     Right Ear: Tympanic membrane normal.     Left Ear: Tympanic membrane normal.     Nose: Nose normal.     Mouth/Throat:     Mouth: Mucous membranes are moist.     Comments: White film on tongue Eyes:     Pupils: Pupils are equal, round, and reactive to light.  Neck:     Vascular: No carotid bruit or JVD.  Cardiovascular:     Rate and Rhythm: Normal rate and regular rhythm.     Heart sounds: Murmur (2/6) heard.  Pulmonary:     Effort: Pulmonary effort is normal. No respiratory distress.     Breath sounds: Normal breath sounds. No wheezing or rales.  Chest:     Chest wall: No tenderness.  Abdominal:     General: Bowel sounds are normal. There is no distension or abdominal bruit.     Palpations: Abdomen is soft. There is no hepatomegaly, splenomegaly, mass or pulsatile mass.     Tenderness: There is no abdominal tenderness.  Musculoskeletal:        General: Normal range of motion.     Cervical back: Normal range of motion and neck supple.  Lymphadenopathy:     Cervical: No cervical adenopathy.  Skin:    General: Skin is warm and dry.  Neurological:     Mental Status: She is alert and oriented to person, place, and  time.     Deep Tendon Reflexes: Reflexes are normal and symmetric.  Psychiatric:        Behavior: Behavior normal.        Thought Content: Thought content normal.        Judgment: Judgment normal.   BP 104/64   Pulse 78   Temp 98 F (36.7 C) (Temporal)   Ht 5' 6 (1.676 m)   Wt 157 lb (71.2 kg)   SpO2 98%   BMI 25.34 kg/m     HGBA1c 5.4      Assessment & Plan:   KIRSTINA LEINWEBER comes in today with chief complaint of No chief complaint on file.   Diagnosis and orders addressed:  1. Mixed hyperlipidemia Low fat diet - Lipid panel - rosuvastatin  (CRESTOR ) 40 MG tablet; Take 1 tablet (40 mg total) by mouth daily.  Dispense: 90 tablet; Refill: 1  2. Diabetes mellitus without complication (HCC) Continue to watch carbs in diet Will attempt to get approval for ozempic  since insurance has changed - Bayer DCA Hb A1c Waived - CBC with Differential/Platelet - CMP14+EGFR - Microalbumin / creatinine urine ratio - Semaglutide ,0.25 or 0.5MG /DOS, 2 MG/3ML SOPN; Inject 0.5 mg into the skin once a week.  Dispense: 3 mL; Refill: 2  3. Long-term current use of injectable noninsulin antidiabetic medication  4. Gastroesophageal reflux disease with esophagitis without hemorrhage Avoid spicy foods Do not eat 2 hours prior to bedtime - famotidine  (PEPCID ) 20 MG tablet; Take 1 tablet (20 mg total) by mouth 2 (two) times daily.  Dispense: 180 tablet; Refill: 1 - lansoprazole  (PREVACID ) 30 MG capsule; Take 1 capsule (30 mg total) by mouth 2 (two)  times daily before a meal.  Dispense: 60 capsule; Refill: 2 - dexlansoprazole  (DEXILANT ) 60 MG capsule; Take 1 capsule (60 mg total) by mouth daily.  Dispense: 90 capsule; Refill: 1  5. Cough variant asthma - budesonide -formoterol  (SYMBICORT ) 80-4.5 MCG/ACT inhaler; Take 2 puffs first thing in am and then another 2 puffs about 12 hours later.  Dispense: 1 each; Refill: 12  6. Slow transit constipation Continue linzess  as prescribed  7. Recurrent  major depressive disorder, in full remission (HCC) Stress management - citalopram  (CELEXA ) 40 MG tablet; Take 1 tablet (40 mg total) by mouth daily.  Dispense: 90 tablet; Refill: 1  8. GAD (generalized anxiety disorder) - LORazepam  (ATIVAN ) 1 MG tablet; Take 1 tablet (1 mg total) by mouth every 8 (eight) hours as needed.  Dispense: 30 tablet; Refill: 5  9. Primary insomnia Bedtime routine  10. BMI 33.0-33.9,adult Discussed diet and exercise for person with BMI >25 Will recheck weight in 3-6 months    Labs pending Health Maintenance reviewed Diet and exercise encouraged  Follow up plan: 6 month   Mary-Margaret Gladis, FNP

## 2023-09-12 LAB — LIPID PANEL
Cholesterol, Total: 170 mg/dL (ref 100–199)
HDL: 63 mg/dL (ref >39)
LDL CALC COMMENT:: 2.7 ratio (ref 0.0–4.4)
LDL Chol Calc (NIH): 93 mg/dL (ref 0–99)
Triglycerides: 76 mg/dL (ref 0–149)
VLDL Cholesterol Cal: 14 mg/dL (ref 5–40)

## 2023-09-12 LAB — CBC WITH DIFFERENTIAL/PLATELET
Basophils Absolute: 0.1 x10E3/uL (ref 0.0–0.2)
Basos: 1 %
EOS (ABSOLUTE): 0.1 x10E3/uL (ref 0.0–0.4)
Eos: 3 %
Hematocrit: 38.6 % (ref 34.0–46.6)
Hemoglobin: 13 g/dL (ref 11.1–15.9)
Immature Grans (Abs): 0 x10E3/uL (ref 0.0–0.1)
Immature Granulocytes: 0 %
Lymphocytes Absolute: 1.4 x10E3/uL (ref 0.7–3.1)
Lymphs: 34 %
MCH: 30.2 pg (ref 26.6–33.0)
MCHC: 33.7 g/dL (ref 31.5–35.7)
MCV: 90 fL (ref 79–97)
Monocytes Absolute: 0.2 x10E3/uL (ref 0.1–0.9)
Monocytes: 6 %
Neutrophils Absolute: 2.4 x10E3/uL (ref 1.4–7.0)
Neutrophils: 56 %
Platelets: 216 x10E3/uL (ref 150–450)
RBC: 4.3 x10E6/uL (ref 3.77–5.28)
RDW: 13.4 % (ref 11.7–15.4)
WBC: 4.2 x10E3/uL (ref 3.4–10.8)

## 2023-09-12 LAB — CMP14+EGFR
ALT: 13 IU/L (ref 0–32)
AST: 18 IU/L (ref 0–40)
Albumin: 4.4 g/dL (ref 3.9–4.9)
Alkaline Phosphatase: 78 IU/L (ref 44–121)
BUN/Creatinine Ratio: 18 (ref 12–28)
BUN: 15 mg/dL (ref 8–27)
Bilirubin Total: 0.4 mg/dL (ref 0.0–1.2)
CO2: 23 mmol/L (ref 20–29)
Calcium: 9.8 mg/dL (ref 8.7–10.3)
Chloride: 105 mmol/L (ref 96–106)
Creatinine, Ser: 0.84 mg/dL (ref 0.57–1.00)
Globulin, Total: 2.2 g/dL (ref 1.5–4.5)
Glucose: 90 mg/dL (ref 70–99)
Potassium: 4.1 mmol/L (ref 3.5–5.2)
Sodium: 142 mmol/L (ref 134–144)
Total Protein: 6.6 g/dL (ref 6.0–8.5)
eGFR: 77 mL/min/1.73 (ref >59)

## 2023-09-13 ENCOUNTER — Ambulatory Visit: Payer: Self-pay | Admitting: Nurse Practitioner

## 2023-09-13 ENCOUNTER — Ambulatory Visit: Admitting: Physical Therapy

## 2023-09-25 NOTE — Telephone Encounter (Signed)
 Reached out to patient to advise the last urinalysis she had done was in April. Patient stated that she was looking at the wrong dates and had not other questions.

## 2023-09-27 ENCOUNTER — Ambulatory Visit: Admitting: Physical Therapy

## 2023-10-08 ENCOUNTER — Ambulatory Visit: Attending: Physician Assistant | Admitting: Physical Therapy

## 2023-10-08 ENCOUNTER — Encounter: Payer: Self-pay | Admitting: Physical Therapy

## 2023-10-08 DIAGNOSIS — R278 Other lack of coordination: Secondary | ICD-10-CM | POA: Diagnosis not present

## 2023-10-08 NOTE — Therapy (Signed)
 OUTPATIENT PHYSICAL THERAPY FEMALE PELVIC TREATMENT   Patient Name: Crystal Wong MRN: [id number] DOB:Aug 08, 1955, 68 y.o., female Today's Date: 10/08/2023  END OF SESSION:  PT End of Session - 10/08/23 1233     Visit Number 3    Date for PT Re-Evaluation 11/13/23    Authorization Type [employer] MEDICARE    Authorization Time Period 16 Therapy Visit(s) from 08/13/2023 to 10/08/2023    PT Start Time 1232    PT Stop Time 1315    PT Time Calculation (min) 43 min    Activity Tolerance Patient tolerated treatment well    Behavior During Therapy [facility] for tasks assessed/performed            Past Medical History:  Diagnosis Date   Anxiety    Asthma    Depression    GERD (gastroesophageal reflux disease)    Heart murmur    Hyperlipidemia    Insomnia    Past Surgical History:  Procedure Laterality Date   43 HOUR PH STUDY N/A 12/27/2022   Procedure: 24 HOUR PH STUDY;  Surgeon: Charlanne Groom, MD;  Location: [facility];  Service: Gastroenterology;  Laterality: N/A;  with impectence   ANKLE FRACTURE SURGERY Left    Pins and screws   BREAST LUMPECTOMY Bilateral    left x 1, right x 2   CARPAL TUNNEL RELEASE Right    CARPAL TUNNEL RELEASE Left    COLONOSCOPY     ESOPHAGEAL MANOMETRY N/A 12/27/2022   Procedure: ESOPHAGEAL MANOMETRY (EM);  Surgeon: Charlanne Groom, MD;  Location: [facility];  Service: Gastroenterology;  Laterality: N/A;   KNEE ARTHROSCOPY Bilateral    PARTIAL HYSTERECTOMY     Patient Active Problem List   Diagnosis Date Noted   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   Chronic cough 10/25/2022   Constipation 08/07/2022   Diabetes mellitus treated with injections of non-insulin medication (HCC) 07/11/2022   Cough variant asthma 04/24/2016   BMI 33.0-33.9,adult 03/02/2016   Mixed hyperlipidemia 08/23/2012   Insomnia 08/23/2012   GERD (gastroesophageal reflux disease) 08/23/2012   Depression 08/23/2012   GAD (generalized anxiety disorder) 08/23/2012    PCP:  Gladis Mustard, FNP  REFERRING PROVIDER: Craig Alan SAUNDERS, PA-C  REFERRING DIAG: K59.00 (ICD-10-CM) - Constipation, unspecified constipation type R35.1 (ICD-10-CM) - Nocturia  THERAPY DIAG:  Other lack of coordination  Rationale for Evaluation and Treatment: Rehabilitation  ONSET DATE: 3 years ago- 2022, but started longer time ago in 1990-1992  SUBJECTIVE:  SUBJECTIVE STATEMENT: Patient reports that she was not able to come due to home renovations last couple of visits. Feels good, her dr wants her to strengthen her pelvic floor. She leaks urine with sex. Constipation is still the same, taking Linzess , eating Kiwi - helping her firm it up.  Without it, she would not go, she would back up, her dr thinks she might have a condition that her digestion is taking too long.  Feels like her movement is better, coffee helps as well. Drinks water and milk Needs milk for nutrition for b12 Is using lubricant- good clean love Her BM's are more regular, she goes in the morning, every day, taking Linzess  every day. I dose of Miralax/ day, wants to do less Still on Ozempic - feels like it is constipating Patient reports that dr wants her on Linzess .  Feels like she is improving     Last visit Pt reports that kiwi helped, she is doing massaging and deep breathing, has knee issues, and left knee pain, has been able to to floor exercises. Her left knee pain is up to 8/10. [facility] ( Dr Sharl) recommended injections- last one lasted 10 days, she goes every 3 months, she is taking celebrex, pt has never had PT for knee before. Pain can be horrible.  Digesting corn takes about 3 days.  Stools became firmer with kiwi, less runny.  Still taking Linzess  and miralax every day She is having a bowel movement  every day now Meat disgusts her Has vaginal dryness, [place] jelly was expired.  Will ask about vaginal estrogen [place] here.  Uses a squatty potty, has a bidet   Fluid intake: water- at least 5-6 glasses, likes a couple diet pepsis/ day  PAIN:  Are you having pain? Yes abdominal pain when she gets backed up, wer back pain, cannot sleep NPRS scale: 7/10 low back pain when it is bad Pain location: low back  Pain type: aching Pain description: when constipated   Aggravating factors: constipation Relieving factors: having BMs  PRECAUTIONS: None  RED FLAGS: None   WEIGHT BEARING RESTRICTIONS: No  FALLS:  Has patient fallen in last 6 months? No  OCCUPATION: retired  ACTIVITY LEVEL : exercises on yoga mat[facility], walks, pelvic lifts 100 reps, crunches 150  PLOF: Independent  PATIENT GOALS: not to take anything for constipation  PERTINENT HISTORY:  Hysterectomy Sexual abuse: No  BOWEL MOVEMENT: Pain with bowel movement: Yes sometimes her hemorrhoids bother her, no pain with Linzess  Type of bowel movement:Type (Bristol Stool Scale) 7 with Linzess  Fully empty rectum: No Leakage: Yes: a little bit, none with type 1  Pads: No Fiber supplement/laxative yes  URINATION: Pain with urination: No Fully empty bladder: No Stream: Strong Urgency: Yes 30 mins- 2 hrs Frequency: 1/ hr Leakage: Coughing and jumping Pads: No period panties  INTERCOURSE:  Ability to have vaginal penetration Yes  Pain with intercourse: none DrynessYes - not using vaginal estrogen Climax: yes Marinoff Scale: 0/3 Laxative: yes  PREGNANCY: 2 very large babies Vaginal deliveries 2 Tearing No Episiotomy Yes  C-section deliveries 0 Currently pregnant No  PROLAPSE: None   OBJECTIVE:  Note: Objective measures were completed at Evaluation unless otherwise noted.   PATIENT SURVEYS:    PFIQ-7: 85  COGNITION: Overall  cognitive status: Within functional limits for tasks assessed     SENSATION: Light touch: Appears intact  LUMBAR SPECIAL TESTS:  Single leg stance test: Positive for valgus knee bilat   POSTURE: rounded shoulders and forward head   LUMBARAROM/PROM: full   LOWER EXTREMITY ROM: full   LOWER EXTREMITY MMT: 4/5 overall  PALPATION:   General: upper chest breathing, elevated shoulders, shallow breathing, decreased involvement of respiratory diaphragm   Pelvic Alignment: even  Abdominal: tenderness and restrictions throughout                External Perineal Exam: mild dryness present                             Internal Pelvic Floor: weakness present, tends to bear down when wanting to contract,  anterior vaginal wall laxity  Patient confirms identification and approves PT to assess internal pelvic floor and treatment Yes Patient confirms identification and approves PT to assess internal pelvic floor and treatment Yes No emotional/communication barriers or cognitive limitation. Patient is motivated to learn. Patient understands and agrees with treatment goals and plan. PT explains patient will be examined in standing, sitting, and lying down to see how their muscles and joints work. When they are ready, they will be asked to remove their underwear so PT can examine their perineum. The patient is also given the option of providing their own chaperone as one is not provided in our facility. The patient also has the right and is explained the right to defer or refuse any part of the evaluation or treatment including the internal exam. With the patient's consent, PT will use one gloved finger to gently assess the muscles of the pelvic floor, seeing how well it contracts and relaxes and if there is muscle symmetry. After, the patient will get dressed and PT and patient will discuss exam findings and plan of care. PT and patient discuss plan of care, schedule, attendance policy and HEP  activities.     PELVIC MMT:   MMT eval  Vaginal 1/5  Internal Anal Sphincter   External Anal Sphincter   Puborectalis   Diastasis Recti 1 finger throughout, firm feel  (Blank rows = not tested)        TONE: low  PROLAPSE: Anterior vaginal wall laxity   TODAY'S TREATMENT:                                                                                                                              DATE:  10/08/2023 Abdominal massage performed Diaphragmatic breathing reed with multiomodal clues during treatment Review of HEP, progress Quadruped transverse abdominis breath with diaphragmatic breathing added  Internal pelvic floor muscle assessment performed today      08/13/2023 Abdominal massage- reviewed and performed Open books with diaphragmatic breathing  Child's pose Reviewed progress and HEP    EVAL see below   PATIENT EDUCATION/ there act: Education details: Pt was educated on relevant anatomy, exam findings, HEP, expectations of PT   Person educated: Patient Education method:  Explanation, Demonstration, Tactile cues, Verbal cues, and Handouts Education comprehension: verbalized understanding, returned demonstration, verbal cues required, tactile cues required, and needs further education  HOME EXERCISE PROGRAM: [id number]  ASSESSMENT:  CLINICAL IMPRESSION:  Patient was seen today for treatment of constipation and pelvic floor weakness ( she leaks during intercourse. This is her 3rd visit. . Patient with improving constipation, saw pelvic floor weakness with internal pelvic floor assessment today with anterior vaginal wall laxity. Patient did fairly well with exercises, manual therapy and education today. We discussed progress, HEP and recommended consistency with PT appts and HEP. Patient had some difficulty with coordination with breathing. Patient is progressing slowly towards goals and will benefit from continued PT to address deficits, reduce leaking with  intercourse and constipation and improve quality of life.        From eval:  Patient is a 68 y.o. F who was seen today for physical therapy evaluation and treatment for constipation and stress urinary incontinence.  She reported increased urinary urgency and frequency at times as well, wears period underwear and is frustrated. Reported vaginal dryness with intercourse.She has had constipation since 1990 after her hysterectomy, recently started working with a GI doctor. Taking Mira lax and linzess  on daily basis. [facility] and [facility] but has to avoid even low impact exercises d/t stress urinary incontinence. At times has back pain up to 7/10 d/t constipation. Pt with upper chest breathing, breath holding tendencies, tenderness throughout abdomen, some bilateral knee weakness present, good hip and trunk mobility. Internal exam deferred to next visit. Pt motivated to participate in PT to achieve goals. She was educated on Physical therapy bowel recommendations today, will get a squatty potty, increase fiber in her diet, discussed reducing dairy and adding kiwi- pt is worried about too much sugar d/t type 2 diabetes. Pt is on Ozempic  and has new food aversions to meat. Not using vaginal estrogen. She will benefit from PT to achieve goals.   OBJECTIVE IMPAIRMENTS: decreased coordination, decreased knowledge of condition, decreased strength, improper body mechanics, and pain.   ACTIVITY LIMITATIONS: continence and toileting  PARTICIPATION LIMITATIONS: interpersonal relationship, driving, and community activity  PERSONAL FACTORS: Time since onset of injury/illness/exacerbation are also affecting patient's functional outcome.   REHAB POTENTIAL: Good  CLINICAL DECISION MAKING: Evolving/moderate complexity  EVALUATION COMPLEXITY: Moderate   GOALS: Goals reviewed with patient? Yes  SHORT TERM GOALS: Target date: 09/10/2023    Pt will be independent with HEP.   Baseline: Goal  status: met  2.  Pt will be independent with use of squatty potty, relaxed toileting mechanics, and improved bowel movement techniques in order to increase ease of bowel movements and complete evacuation.   Baseline:  Goal status: met  3.  Pt will be independent with the knack, urge suppression technique, and double voiding in order to improve bladder habits and decrease urinary incontinence.   Baseline:  Goal status: progressing  4.  Pt will be I with abdominal massage to reduce constipation Baseline:  Goal status: met  5.  Pt will add at least 25 grams of fiber to her diet daily to improve constipation Baseline:  Goal status: met    LONG TERM GOALS: Target date: 11/05/2023    Pt will have reduced low back pain to max 1/10 in order to be able to do functional activities such as bending, lifting, twisting and walking as needed to be able to take care of her family and participate in community activities and  travel.  Baseline: 7/10 Goal status: INITIAL  2.  Pt will be able to have a bowel movement within 2-3 minutes so she is not straining and adding pressure on the pelvic floor due to the ability to lengthen the tissue and generate  pressure to push the stool out  Baseline:  Goal status: INITIAL  3.  Pt will soak 0 pads/ day in order to run errands and not have to interrupt daily tasks or exercise d/t fecal or urinary leakage  Baseline:  Goal status: INITIAL  4.  Pt will demonstrate at least 20 point improvement in PFIQ-7 score in order to show functional improvement in urinary incontinence and constipation Baseline: 76 Goal status: INITIAL  5.  Pt will have at least 5 bowel movements/ week and report complete rectum emptying with type 3-4 stool Baseline: Bristol stool scale type 7 with linzess  Goal status: INITIAL  6.  Pt will get up max 1 time/ night to urinate so she can get better rest Baseline: 1-2 Goal status: INITIAL  PLAN:  PT FREQUENCY: 1-2x/week  PT  DURATION: 12 weeks  PLANNED INTERVENTIONS: 97110-Therapeutic exercises, 97530- Therapeutic activity, 97112- Neuromuscular re-education, 97535- Self Care, [id number]- Manual therapy, [phone]- Aquatic Therapy, [phone]- Electrical stimulation (manual), Patient/Family education, Taping, Joint mobilization, Joint manipulation, Spinal manipulation, Spinal mobilization, Scar mobilization, Cryotherapy, Moist heat, and Biofeedback  PLAN FOR NEXT SESSION: internal pelvic floor muscle assessment, review exercise routine, gym exercises, abdominal massage   Tye Vigo, PT 10/08/2023, 12:34 PM

## 2023-10-08 NOTE — Patient Instructions (Signed)
 Fiber Table -- Grams of Fiber in Food  For additional information on fiber content in foods, go to www.caloriecounts.com  Food Products Serving Size Grams of Fiber/serving  Breads    Whole Wheat 1 slice 2.11  White 1 slice 0.5  Rye 1 slice 1.72  Cereals    Oat Bran 1 oz. 4.06  Wheat Bran 1 oz. 10.0  All Bran  cup 6.0  Optimum 1 cup 10.0  Whole Wheat Total 1 cup 3.0  Fiber One   cup 13.0  Shredded Wheat 1oz. 2.64  Corn Flakes 1 oz. 0.45  Cheerio's 1 1/3 cup 2.0  Oatmeal 1 oz. 2.5  Rice    Brown  cup 5.27  White  cup 1.42  Spaghetti 2 oz. 2.56  Vegetables (cooked)    Broccoli  cup 2.58  Brussels sprouts  cup 2.0  Cauliflower  cup 2.6  Carrots  cup 3.2  Corn  cup 3.03  Eggplant  cup 0.96  Green peas  cup 3.36  Lettuce (raw)  cup 0.24  Baked potato w/skin  cup 2.97  Spinach  cup 2.07  Squash  cup 2.87  Tomato (raw)  cup 1.17  Zucchini  cup 1.26  Beans    Green (canned)  cup 1.89  Kidney  cup 5.48  Lima  cup 4.25  Pinto  cup 5.93  Fresh fruits    Apple (with peel) 1 medium 2.76  Apricots 1 cup 3.13  Banana  1 medium 2.19  Black/Boysenberries 1 cup 7.2  Grapefruit 1 medium 3.61  Grapes 1 cup 1.12  Nectarine 1 medium 2.2  Orange 1 medium 3.14  Pear (with peel) 1 medium 4.32  Prunes 3 3.5  Raspberries 1 cup 7.5  Strawberries 1 cup 3.87  Watermelon 1 slice 1.93

## 2023-10-09 ENCOUNTER — Encounter: Payer: Self-pay | Admitting: Nurse Practitioner

## 2023-10-09 ENCOUNTER — Ambulatory Visit: Admitting: Nurse Practitioner

## 2023-10-09 ENCOUNTER — Ambulatory Visit: Payer: Self-pay

## 2023-10-09 VITALS — BP 131/75 | HR 80 | Temp 97.5°F | Ht 66.0 in | Wt 154.0 lb

## 2023-10-09 DIAGNOSIS — M79604 Pain in right leg: Secondary | ICD-10-CM

## 2023-10-09 MED ORDER — KETOROLAC TROMETHAMINE 60 MG/2ML IM SOLN
60.0000 mg | Freq: Once | INTRAMUSCULAR | Status: AC
  Administered 2023-10-09: 60 mg via INTRAMUSCULAR

## 2023-10-09 MED ORDER — PREDNISONE 20 MG PO TABS: ORAL_TABLET | ORAL | 0 refills | Status: DC

## 2023-10-09 MED ORDER — METHYLPREDNISOLONE ACETATE 80 MG/ML IJ SUSP
80.0000 mg | Freq: Once | INTRAMUSCULAR | Status: AC
  Administered 2023-10-09: 80 mg via INTRAMUSCULAR

## 2023-10-09 NOTE — Telephone Encounter (Signed)
 Pt has appt

## 2023-10-09 NOTE — Progress Notes (Signed)
   Subjective:    Patient ID: Crystal Wong, female    DOB: 29-Nov-1955, 68 y.o.   MRN: [id number]   Chief Complaint: leg pain  HPI  Patient in c/o of right lower anterior leg pain that started over a week ago. Is on celebrex and tylenol . Not much help. Pain is achy pain and starts in shin area and radiates up leg. Pain is worse when laying down. Rates pain 8/10 currently. Feels like shin splints. 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  Eyes:  Negative for pain.  Respiratory:  Negative for shortness of breath.   Cardiovascular:  Negative for chest pain, palpitations and leg swelling.  Gastrointestinal:  Negative for abdominal pain.  Endocrine: Negative for polydipsia.  Skin:  Negative for rash.  Neurological:  Negative for dizziness, weakness and headaches.  Hematological:  Does not bruise/bleed easily.  All other systems reviewed and are negative.      Objective:   Physical Exam Constitutional:      Appearance: Normal appearance.  Cardiovascular:     Rate and Rhythm: Normal rate and regular rhythm.     Heart sounds: Normal heart sounds.  Pulmonary:     Effort: Pulmonary effort is normal.     Breath sounds: Normal breath sounds.  Musculoskeletal:     Right lower leg: Edema present.     Left lower leg: Edema present.     Comments: Pain posterior shin when sitting (-) SLR bil FROM of lumbar spine without pain.  Skin:    General: Skin is warm.  Neurological:      General: No focal deficit present.     Mental Status: She is alert and oriented to person, place, and time.  Psychiatric:        Mood and Affect: Mood normal.        Behavior: Behavior normal.     BP 131/75   Pulse 80   Temp (!) 97.5 F (36.4 C) (Temporal)   Ht 5' 6 (1.676 m)   Wt 154 lb (69.9 kg)   SpO2 99%   BMI 24.86 kg/m        Assessment & Plan:   Crystal Wong in today with chief complaint of leg pain  1. Right leg pain (Primary) Moist heat Stretches discussed - ketorolac  (TORADOL ) injection 60 mg - methylPREDNISolone  acetate (DEPO-MEDROL ) injection 80 mg - predniSONE  (DELTASONE ) 20 MG tablet; 2 po at sametime daily for 5 days-  Dispense: 10 tablet; Refill: 0    The above assessment and management plan was discussed with the patient. The patient verbalized understanding of and has agreed to the management plan. Patient is aware to call the clinic if symptoms persist or worsen. Patient is aware when to return to the clinic for a follow-up visit. Patient educated on when it is appropriate to go to the emergency department.   Mary-Margaret Gladis, FNP

## 2023-10-09 NOTE — Telephone Encounter (Signed)
 FYI Only or Action Required?: FYI only for provider.  Patient was last seen in primary care on 09/11/2023 by Crystal Mustard, FNP.  Called Nurse Triage reporting Leg Pain.  Symptoms began several days ago.  Interventions attempted: OTC medications: Tylenol , Prescription medications: Celebrex, and Ice/heat application.  Symptoms are: 8/10 right shin pain gradually worsening.  Triage Disposition: See HCP Within 4 Hours (Or PCP Triage)  Patient/caregiver understands and will follow disposition?: Yes             Copied from CRM #[id number]. Topic: Clinical - Red Word Triage >> Oct 09, 2023  8:25 AM [name] wrote: Red Word that prompted transfer to Nurse Triage: Pt calling about bad pain in both legs. No swelling. No numbness or tingling. Been going on since Friday. Affecting pt sleep Reason for Disposition  [1] SEVERE pain (e.g., excruciating, unable to do any normal activities) AND [2] not improved after 2 hours of pain medicine  Answer Assessment - Initial Assessment Questions 1. ONSET: When did the pain start?      Friday.  2. LOCATION: Where is the pain located?      Started at front of right shin, felt like a shin splint. It has been gradually worsening. She states it is mainly the shin but has spread somewhat, does not go up into thigh.  3. PAIN: How bad is the pain?    (Scale 1-10; or mild, moderate, severe)     8/10, tenderness or feels like a cramp. Patient states she has been taking Tylenol , iced the leg, taken Celebrex.  4. WORK OR EXERCISE: Has there been any recent work or exercise that involved this part of the body?      Patient states she bumped the front of her leg a couple weeks ago. She states she does exercises every day but she has tapered off since the pain.  5. CAUSE: What do you think is causing the leg pain?     She states she thinks it could be a vitamin deficiency.   6. OTHER SYMPTOMS: Do you have any other symptoms? (e.g., chest  pain, back pain, breathing difficulty, swelling, rash, fever, numbness, weakness)     Patient denies swelling, numbness or tingling, on blood thinners, redness, chest pain, SOB, rash, fever.  7. PREGNANCY: Is there any chance you are pregnant? When was your last menstrual period?     N/A.  Protocols used: Leg Pain-A-AH

## 2023-10-09 NOTE — Patient Instructions (Signed)
 Leg Cramps: What They Mean Leg cramps happen when one or more muscles tighten and there's no control over it. They can happen during exercise or when you're resting. Leg cramps are painful and can last for a few seconds to minutes. They can also come back many times before stopping. Usually, leg cramps aren't caused by a serious medical problem. Often, the cause isn't known. Some common causes include: Problems with moving or not moving the body, like: Working your muscles too hard, such as during intense exercise. Doing the same motion over and over. Not warming up or stretching before playing sports or doing activities. Using the wrong technique or form when playing sports or doing activities. Staying in one position for a long time. Water or electrolyte balance issues, like: Not drinking enough fluids or being dehydrated. Getting sick from too much heat. Having low levels of minerals called electrolytes in your blood, like potassium and calcium . This can happen from: Pregnancy. Taking medicines that make you pee more, also called diuretic medicines. Not getting enough nutrients from your diet. Side effects of some medicines. Follow these instructions at home: Eating and drinking Eat and drink as told. Eat a healthy diet that includes plenty of nutrients to help your muscles work well. A healthy diet includes fruits and vegetables, lean protein, whole grains, and low-fat or nonfat dairy products. Drink enough fluids to keep your pee pale yellow. Drinking more water may help prevent cramps. Managing pain and muscle cramping     Massage, stretch, and relax the cramped muscle. Do this for several minutes at a time. Use ice or an ice pack as told. Place a towel between your skin and the ice. Leave the ice on for 20 minutes, 2-3 times a day. Use heat as told. Use the heat source that your provider recommends, such as a moist heat pack or a heating pad. Do this as often as told. Place a  towel between your skin and the heat source. Leave the heat on for 20-30 minutes. If your skin turns red, take off the ice or heat right away to prevent skin damage. The risk of damage is higher if you can't feel pain, heat, or cold. Take hot showers or baths to help relax tight muscles. General instructions If you're having a lot of leg cramps, avoid hard workouts for several days. Take supplements and medicines only as told. Contact a health care provider if: Your leg cramps get worse or happen more often. Your leg cramps don't get better over time. Your foot becomes cold, numb, or blue. This information is not intended to replace advice given to you by your health care provider. Make sure you discuss any questions you have with your health care provider. Document Revised: 02/09/2023 Document Reviewed: 11/01/2022 Elsevier Patient Education  2025 [employer].

## 2023-10-15 ENCOUNTER — Ambulatory Visit: Admitting: Physical Therapy

## 2023-10-15 ENCOUNTER — Encounter: Payer: Self-pay | Admitting: Physical Therapy

## 2023-10-15 DIAGNOSIS — R278 Other lack of coordination: Secondary | ICD-10-CM | POA: Diagnosis not present

## 2023-10-15 NOTE — Therapy (Signed)
 OUTPATIENT PHYSICAL THERAPY FEMALE PELVIC TREATMENT   Patient Name: Crystal Wong MRN: [id number] DOB:October 24, 1955, 68 y.o., female Today's Date: 10/15/2023  END OF SESSION:  PT End of Session - 10/15/23 1141     Visit Number 4    Date for PT Re-Evaluation 11/13/23    Authorization Type [employer] MEDICARE    Authorization Time Period [employer] APPROVED 4 VISITS 10/08/2023 - 11/19/2023    Authorization - Visit Number 1    Authorization - Number of Visits 4    Progress Note Due on Visit --    PT Start Time 1145    PT Stop Time 1230    PT Time Calculation (min) 45 min    Activity Tolerance Patient tolerated treatment well    Behavior During Therapy WFL for tasks assessed/performed            Past Medical History:  Diagnosis Date   Anxiety    Asthma    Depression    GERD (gastroesophageal reflux disease)    Heart murmur    Hyperlipidemia    Insomnia    Past Surgical History:  Procedure Laterality Date   48 HOUR PH STUDY N/A 12/27/2022   Procedure: 24 HOUR PH STUDY;  Surgeon: Charlanne Groom, MD;  Location: [facility];  Service: Gastroenterology;  Laterality: N/A;  with impectence   ANKLE FRACTURE SURGERY Left    Pins and screws   BREAST LUMPECTOMY Bilateral    left x 1, right x 2   CARPAL TUNNEL RELEASE Right    CARPAL TUNNEL RELEASE Left    COLONOSCOPY     ESOPHAGEAL MANOMETRY N/A 12/27/2022   Procedure: ESOPHAGEAL MANOMETRY (EM);  Surgeon: Charlanne Groom, MD;  Location: [facility];  Service: Gastroenterology;  Laterality: N/A;   KNEE ARTHROSCOPY Bilateral    PARTIAL HYSTERECTOMY     Patient Active Problem List   Diagnosis Date Noted   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   Chronic cough 10/25/2022   Constipation 08/07/2022   Diabetes mellitus treated with injections of non-insulin medication (HCC) 07/11/2022   Cough variant asthma 04/24/2016   BMI 33.0-33.9,adult 03/02/2016   Mixed hyperlipidemia 08/23/2012   Insomnia 08/23/2012   GERD (gastroesophageal reflux  disease) 08/23/2012   Depression 08/23/2012   GAD (generalized anxiety disorder) 08/23/2012    PCP: Gladis Mustard, FNP  REFERRING PROVIDER: Craig Alan SAUNDERS, PA-C  REFERRING DIAG: K59.00 (ICD-10-CM) - Constipation, unspecified constipation type R35.1 (ICD-10-CM) - Nocturia  THERAPY DIAG:  Other lack of coordination  Rationale for Evaluation and Treatment: Rehabilitation  ONSET DATE: 3 years ago- 2022, but started longer time ago in 1990-1992  SUBJECTIVE:  SUBJECTIVE STATEMENT: Patient reports that she has had some leg pain, has been on steroids, was told she had a sciatica injury.  Was told to take it easy She is afraid that pain will come back Stools are runnier again, she is taking Linzess , kiwi and fiber.Dose of Miralax Had some salad last night- paid for it, is told not to eat vegetables Does not feel constipated Has Bristol stool scale type 6.     Patient reports that she was not able to come due to home renovations last couple of visits. Feels good, her dr wants her to strengthen her pelvic floor. She leaks urine with sex. Constipation is still the same, taking Linzess , eating Kiwi - helping her firm it up.  Without it, she would not go, she would back up, her dr thinks she might have a condition that her digestion is taking too long.  Feels like her movement is better, coffee helps as well. Drinks water and milk Needs milk for nutrition for b12 Is using lubricant- good clean love Her BM's are more regular, she goes in the morning, every day, taking Linzess  every day. I dose of Miralax/ day, wants to do less Still on Ozempic - feels like it is constipating Patient reports that dr wants her on Linzess .  Feels like she is improving     Last visit Pt reports that kiwi helped,  she is doing massaging and deep breathing, has knee issues, and left knee pain, has been able to to floor exercises. Her left knee pain is up to 8/10. [facility] ( Dr Sharl) recommended injections- last one lasted 10 days, she goes every 3 months, she is taking celebrex, pt has never had PT for knee before. Pain can be horrible.  Digesting corn takes about 3 days.  Stools became firmer with kiwi, less runny.  Still taking Linzess  and miralax every day She is having a bowel movement every day now Meat disgusts her Has vaginal dryness, [place] jelly was expired.  Will ask about vaginal estrogen [place] mins [place] here.  Uses a squatty potty, has a bidet   Fluid intake: water- at least 5-6 glasses, likes a couple diet pepsis/ day  PAIN:  Are you having pain? Yes abdominal pain when she gets backed up, wer back pain, cannot sleep NPRS scale: 7/10 low back pain when it is bad Pain location: low back  Pain type: aching Pain description: when constipated   Aggravating factors: constipation Relieving factors: having BMs  PRECAUTIONS: None  RED FLAGS: None   WEIGHT BEARING RESTRICTIONS: No  FALLS:  Has patient fallen in last 6 months? No  OCCUPATION: retired  ACTIVITY LEVEL : exercises on yoga mat[facility], walks, pelvic lifts 100 reps, crunches 150  PLOF: Independent  PATIENT GOALS: not to take anything for constipation  PERTINENT HISTORY:  Hysterectomy Sexual abuse: No  BOWEL MOVEMENT: Pain with bowel movement: Yes sometimes her hemorrhoids bother her, no pain with Linzess  Type of bowel movement:Type (Bristol Stool Scale) 7 with Linzess  Fully empty rectum: No Leakage: Yes: a little bit, none with type 1  Pads: No Fiber supplement/laxative yes  URINATION: Pain with urination: No Fully empty bladder: No Stream: Strong Urgency: Yes 30 mins- 2 hrs Frequency: 1/ hr Leakage: Coughing and jumping Pads: No period  panties  INTERCOURSE:  Ability to have vaginal penetration Yes  Pain with intercourse: none DrynessYes - not using vaginal estrogen Climax: yes  Marinoff Scale: 0/3 Laxative: yes  PREGNANCY: 2 very large babies Vaginal deliveries 2 Tearing No Episiotomy Yes  C-section deliveries 0 Currently pregnant No  PROLAPSE: None   OBJECTIVE:  Note: Objective measures were completed at Evaluation unless otherwise noted.   PATIENT SURVEYS:    PFIQ-7: 73  COGNITION: Overall cognitive status: Within functional limits for tasks assessed     SENSATION: Light touch: Appears intact  LUMBAR SPECIAL TESTS:  Single leg stance test: Positive for valgus knee bilat   POSTURE: rounded shoulders and forward head   LUMBARAROM/PROM: full   LOWER EXTREMITY ROM: full   LOWER EXTREMITY MMT: 4/5 overall  PALPATION:   General: upper chest breathing, elevated shoulders, shallow breathing, decreased involvement of respiratory diaphragm   Pelvic Alignment: even  Abdominal: tenderness and restrictions throughout                External Perineal Exam: mild dryness present                             Internal Pelvic Floor: weakness present, tends to bear down when wanting to contract,  anterior vaginal wall laxity  Patient confirms identification and approves PT to assess internal pelvic floor and treatment Yes Patient confirms identification and approves PT to assess internal pelvic floor and treatment Yes No emotional/communication barriers or cognitive limitation. Patient is motivated to learn. Patient understands and agrees with treatment goals and plan. PT explains patient will be examined in standing, sitting, and lying down to see how their muscles and joints work. When they are ready, they will be asked to remove their underwear so PT can examine their perineum. The patient is also given the option of providing their own chaperone as one is not provided in our facility. The patient also  has the right and is explained the right to defer or refuse any part of the evaluation or treatment including the internal exam. With the patient's consent, PT will use one gloved finger to gently assess the muscles of the pelvic floor, seeing how well it contracts and relaxes and if there is muscle symmetry. After, the patient will get dressed and PT and patient will discuss exam findings and plan of care. PT and patient discuss plan of care, schedule, attendance policy and HEP activities.     PELVIC MMT:   MMT eval  Vaginal 1/5  Internal Anal Sphincter   External Anal Sphincter   Puborectalis   Diastasis Recti 1 finger throughout, firm feel  (Blank rows = not tested)        TONE: low  PROLAPSE: Anterior vaginal wall laxity   TODAY'S TREATMENT:                                                                                                                              DATE:  10/15/2023 Open books foam roller 10 reps Self care- education on bristol  stool chart, optimal bristol stool scale to reduce risk of incontinence and constipation Non irritating range- diaphragmatic breathing with #10 lift off table 15 reps Shoulder extensions with transverse abdominis breath VC's for relaxed shoulders 20 reps Seated diaphragmatic breathing with #5 STS with #5 with transverse abdominis breath 10 reps      10/08/2023 Abdominal massage performed Diaphragmatic breathing reed with multiomodal clues during treatment Review of HEP, progress Quadruped transverse abdominis breath with diaphragmatic breathing added  Internal pelvic floor muscle assessment performed today      08/13/2023 Abdominal massage- reviewed and performed Open books with diaphragmatic breathing  Child's pose Reviewed progress and HEP    EVAL see below   PATIENT EDUCATION/ there act: Education details: Pt was educated on relevant anatomy, exam findings, HEP, expectations of PT   Person educated: Patient Education  method: Explanation, Demonstration, Tactile cues, Verbal cues, and Handouts Education comprehension: verbalized understanding, returned demonstration, verbal cues required, tactile cues required, and needs further education  HOME EXERCISE PROGRAM: [id number]  ASSESSMENT:  CLINICAL IMPRESSION: Patient was seen today for treatment of constipation. Patient with Bristol stool scale 6 type stool. Some recent low back, sciatic nerve irritation, patient was afraid to do her exercises, reviewed exercises and modified to no have any strain.  Patient did fairly well  with exercises and well with education today. We discussed progress, HEP and recommended consistency with pelvic floor strengthening. Treatment session focused on core coordination to improve strength and control. Patient had some difficulty with coordination with breathing. Patient is progressing well towards goals and will benefit from continued PT to address deficits, reduce constipation and leaking with intercourse and improve quality of life.              From eval:  Patient is a 68 y.o. F who was seen today for physical therapy evaluation and treatment for constipation and stress urinary incontinence.  She reported increased urinary urgency and frequency at times as well, wears period underwear and is frustrated. Reported vaginal dryness with intercourse.She has had constipation since 1990 after her hysterectomy, recently started working with a GI doctor. Taking Mira lax and linzess  on daily basis. [facility] and [facility] but has to avoid even low impact exercises d/t stress urinary incontinence. At times has back pain up to 7/10 d/t constipation. Pt with upper chest breathing, breath holding tendencies, tenderness throughout abdomen, some bilateral knee weakness present, good hip and trunk mobility. Internal exam deferred to next visit. Pt motivated to participate in PT to achieve goals. She was educated on Physical  therapy bowel recommendations today, will get a squatty potty, increase fiber in her diet, discussed reducing dairy and adding kiwi- pt is worried about too much sugar d/t type 2 diabetes. Pt is on Ozempic  and has new food aversions to meat. Not using vaginal estrogen. She will benefit from PT to achieve goals.   OBJECTIVE IMPAIRMENTS: decreased coordination, decreased knowledge of condition, decreased strength, improper body mechanics, and pain.   ACTIVITY LIMITATIONS: continence and toileting  PARTICIPATION LIMITATIONS: interpersonal relationship, driving, and community activity  PERSONAL FACTORS: Time since onset of injury/illness/exacerbation are also affecting patient's functional outcome.   REHAB POTENTIAL: Good  CLINICAL DECISION MAKING: Evolving/moderate complexity  EVALUATION COMPLEXITY: Moderate   GOALS: Goals reviewed with patient? Yes  SHORT TERM GOALS: Target date: 09/10/2023    Pt will be independent with HEP.   Baseline: Goal status: met  2.  Pt will be independent with use of  squatty potty, relaxed toileting mechanics, and improved bowel movement techniques in order to increase ease of bowel movements and complete evacuation.   Baseline:  Goal status: met  3.  Pt will be independent with the knack, urge suppression technique, and double voiding in order to improve bladder habits and decrease urinary incontinence.   Baseline:  Goal status: progressing  4.  Pt will be I with abdominal massage to reduce constipation Baseline:  Goal status: met  5.  Pt will add at least 25 grams of fiber to her diet daily to improve constipation Baseline:  Goal status: met    LONG TERM GOALS: Target date: 11/05/2023    Pt will have reduced low back pain to max 1/10 in order to be able to do functional activities such as bending, lifting, twisting and walking as needed to be able to take care of her family and participate in community activities and travel.  Baseline:  7/10 Goal status: INITIAL  2.  Pt will be able to have a bowel movement within 2-3 minutes so she is not straining and adding pressure on the pelvic floor due to the ability to lengthen the tissue and generate  pressure to push the stool out  Baseline:  Goal status: INITIAL  3.  Pt will soak 0 pads/ day in order to run errands and not have to interrupt daily tasks or exercise d/t fecal or urinary leakage  Baseline:  Goal status: INITIAL  4.  Pt will demonstrate at least 20 point improvement in PFIQ-7 score in order to show functional improvement in urinary incontinence and constipation Baseline: 76 Goal status: INITIAL  5.  Pt will have at least 5 bowel movements/ week and report complete rectum emptying with type 3-4 stool Baseline: Bristol stool scale type 7 with linzess  Goal status: INITIAL  6.  Pt will get up max 1 time/ night to urinate so she can get better rest Baseline: 1-2 Goal status: INITIAL  PLAN:  PT FREQUENCY: 1-2x/week  PT DURATION: 12 weeks  PLANNED INTERVENTIONS: 97110-Therapeutic exercises, 97530- Therapeutic activity, 97112- Neuromuscular re-education, 97535- Self Care, [id number]- Manual therapy, [phone]- Aquatic Therapy, [phone]- Electrical stimulation (manual), Patient/Family education, Taping, Joint mobilization, Joint manipulation, Spinal manipulation, Spinal mobilization, Scar mobilization, Cryotherapy, Moist heat, and Biofeedback  PLAN FOR NEXT SESSION: review exercise routine, gym exercises, continue core coordination exercises with breathing   Wash Nienhaus, PT 10/15/2023, 11:45 AM

## 2023-10-16 ENCOUNTER — Encounter: Payer: Self-pay | Admitting: Nurse Practitioner

## 2023-10-16 ENCOUNTER — Ambulatory Visit (INDEPENDENT_AMBULATORY_CARE_PROVIDER_SITE_OTHER): Admitting: Nurse Practitioner

## 2023-10-16 VITALS — BP 129/74 | HR 71 | Temp 97.8°F | Ht 66.0 in | Wt 155.0 lb

## 2023-10-16 DIAGNOSIS — M79605 Pain in left leg: Secondary | ICD-10-CM

## 2023-10-16 MED ORDER — PREDNISONE 10 MG (21) PO TBPK: ORAL_TABLET | ORAL | 0 refills | Status: DC

## 2023-10-16 NOTE — Patient Instructions (Signed)
 Shin Splints  Shin splints is a painful condition that is felt either on the bone that is located in the front of the lower leg (tibia or shin bone) or in the muscles on either side of the bone. This condition happens when physical activity leads to inflammation of the muscles, tendons, and the thin layer of tissue that covers the shin bone. It may result from participating in sports or other intense exercises. What are the causes? This condition may be caused by: Muscle overuse. Repetitive activities. Flat feet or rigid arches. Activities that could contribute to shin splints include: Having a sudden increase in exercise time. Starting a new, intense activity. Running up hills or long distances. Playing sports that involve sudden starts and stops. Not warming up before activity. Wearing old or worn-out shoes. What are the signs or symptoms? The main symptom of this condition is pain that occurs: On the front of the lower leg. In the muscles on either side of the shin bone. While exercising or at rest. How is this diagnosed? This condition may be diagnosed based on: A physical exam. Your symptoms. Observation. Your health care provider may watch you while you walk or run. X-rays or other imaging tests. These may be done to rule out other problems. How is this treated? Treatment for this condition depends on your age, history, overall health, and how bad the pain is. Most cases can be managed by doing one or more of the following: Resting. Reducing the length and intensity of your exercise. Stopping or reducing the activity that causes shin pain. Taking medicines to control the inflammation. Icing, massaging, stretching, and strengthening the affected area. Wearing shoes that have rigid heels, shock absorption, and a good arch support. For severe shin pain, your health care provider may recommend that you use crutches to avoid putting weight on your legs. Use crutches as you are  told. Follow these instructions at home: Medicines Take over-the-counter and prescription medicines only as told by your health care provider. Ask your health care provider if the medicine prescribed to you: Requires you to avoid driving or using machinery. Can cause constipation. You may need to take these actions to prevent or treat constipation: Drink enough fluid to keep your urine pale yellow. Take over-the-counter or prescription medicines. Eat foods that are high in fiber, such as beans, whole grains, and fresh fruits and vegetables. Limit foods that are high in fat and processed sugars, such as fried or sweet foods. Managing pain, stiffness, and swelling     If directed, put ice on the painful area. To do this: Put ice in a plastic bag. Place a towel between your skin and the bag. Leave the ice on for 20 minutes, 2-3 times a day. If your skin turns bright red, remove the ice right away to prevent skin damage. The risk of skin damage is higher if you cannot feel pain, heat, or cold. If directed, apply heat to the painful area before you do the stretching exercises, or as told by your health care provider. Use the heat source that your health care provider recommends, such as a moist heat pack or a heating pad. Place a towel between your skin and the heat source. Leave the heat on for 20-30 minutes. If your skin turns bright red, remove the heat right away to prevent burns. The risk of burns is higher if you cannot feel pain, heat, or cold. Massage, stretch, and strengthen the affected area as told by your  health care provider. Wear compression sleeves or socks as told by your health care provider. Raise (elevate) your legs above the level of your heart while you are sitting or lying down. Activity Rest as needed. Return to activity gradually as told by your health care provider. When you start exercising again, begin with non-weight-bearing exercises, such as cycling or  swimming. Stop running if the pain returns. Warm up properly before exercising. Run on a surface that is level and fairly firm. Gradually change the intensity of an exercise. If you increase your running distance, add only 5-10% to your distance each week. This means that if you are running 5 miles this week, you should only increase your run by - mile for next week. General instructions Wear shoes that have rigid heels, shock absorption, and a good arch support. Change your athletic shoes every 6 months, or every 350-450 miles. Contact a health care provider if: Your symptoms continue or get worse after treatment. The location, intensity, or type of pain changes. You have swelling in your lower leg that gets worse. Your shin becomes red and feels warm. Get help right away if: You have severe pain. You have trouble walking. Summary Shin splints happens when physical activities lead to inflammation of the muscles, tendons, and the thin layer of tissue that covers the shin bone. Treatments may include medicines, resting, and icing. Return to activity gradually as told by your health care provider. Make sure you know what symptoms should cause you to contact your health care provider. This information is not intended to replace advice given to you by your health care provider. Make sure you discuss any questions you have with your health care provider. Document Revised: 05/20/2021 Document Reviewed: 05/20/2021 Elsevier Patient Education  2024 [employer].

## 2023-10-16 NOTE — Progress Notes (Signed)
   Subjective:    Patient ID: Crystal Wong, female    DOB: 1955/11/18, 68 y.o.   MRN: [id number]   Chief Complaint: leg pain  HPI  Patient in today c/o bil leg pain. She was seen last week with same complaint and was prescribed prednisone . She describes pain as stabbing sharp pain that goes  to the back of calf. Feels like shin splints. No edema. While she was on steroids pain was gone. Celebrex has not helped. Came back yesterday. Pain is at rest and worsens when do any activity.  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  Eyes:  Negative for pain.  Respiratory:  Negative for shortness of breath.   Cardiovascular:  Negative for chest pain, palpitations and leg swelling.  Gastrointestinal:  Negative for abdominal pain.  Endocrine: Negative for polydipsia.  Skin:  Negative for rash.  Neurological:  Negative for dizziness, weakness and headaches.  Hematological:  Does not bruise/bleed easily.  All other systems reviewed and are negative.      Objective:   Physical Exam Constitutional:      Appearance: Normal appearance.  Cardiovascular:     Rate and Rhythm: Normal rate and regular rhythm.     Heart sounds: Normal heart sounds.  Pulmonary:     Effort: Pulmonary effort is normal.     Breath sounds: Normal breath sounds.  Musculoskeletal:     Comments: FROM  of left lower leg- no pain Negative homan sign No lower ext edema  Neurological:     General: No focal deficit  present.     Mental Status: She is alert and oriented to person, place, and time.  Psychiatric:        Mood and Affect: Mood normal.        Behavior: Behavior normal.    BP 129/74   Pulse 71   Temp 97.8 F (36.6 C) (Temporal)   Ht 5' 6 (1.676 m)   Wt 155 lb (70.3 kg)   SpO2 99%   BMI 25.02 kg/m         Assessment & Plan:   Crystal Wong in today with chief complaint of Right shin pain   1. Pain of left lower extremity (Primary) Ice stretches - predniSONE  (STERAPRED UNI-PAK 21 TAB) 10 MG (21) TBPK tablet; As directed x 6 days  Dispense: 21 tablet; Refill: 0 - Ambulatory referral to Orthopedic Surgery    The above assessment and management plan was discussed with the patient. The patient verbalized understanding of and has agreed to the management plan. Patient is aware to call the clinic if symptoms persist or worsen. Patient is aware when to return to the clinic for a follow-up visit. Patient educated on when it is appropriate to go to the emergency department.   Mary-Margaret Gladis, FNP

## 2023-11-08 DIAGNOSIS — M79661 Pain in right lower leg: Secondary | ICD-10-CM | POA: Diagnosis not present

## 2023-11-25 DIAGNOSIS — A260 Cutaneous erysipeloid: Secondary | ICD-10-CM | POA: Diagnosis not present

## 2023-11-25 DIAGNOSIS — W57XXXA Bitten or stung by nonvenomous insect and other nonvenomous arthropods, initial encounter: Secondary | ICD-10-CM | POA: Diagnosis not present

## 2024-01-10 ENCOUNTER — Other Ambulatory Visit: Payer: Self-pay | Admitting: Cardiology

## 2024-01-10 DIAGNOSIS — I350 Nonrheumatic aortic (valve) stenosis: Secondary | ICD-10-CM

## 2024-01-10 DIAGNOSIS — E782 Mixed hyperlipidemia: Secondary | ICD-10-CM

## 2024-01-17 ENCOUNTER — Ambulatory Visit: Payer: Medicare Other

## 2024-01-17 ENCOUNTER — Ambulatory Visit: Payer: Self-pay

## 2024-01-17 VITALS — BP 129/74 | HR 71 | Ht 66.0 in | Wt 155.0 lb

## 2024-01-17 DIAGNOSIS — Z0000 Encounter for general adult medical examination without abnormal findings: Secondary | ICD-10-CM | POA: Diagnosis not present

## 2024-01-17 NOTE — Progress Notes (Addendum)
 Chief Complaint  Patient presents with   Medicare Wellness     Subjective:   Crystal Wong is a 68 y.o. female who presents for a Medicare Annual Wellness Visit.  Allergies (verified) Sulfa antibiotics, Sulfamethoxazole-trimethoprim, and Elemental sulfur   History: Past Medical History:  Diagnosis Date   Allergy    Sulfur drugs, pollen   Anxiety    Arthritis    Asthma    Cataract    Depression    GERD (gastroesophageal reflux disease)    Heart murmur    Hyperlipidemia    Insomnia    Ulcer    Past Surgical History:  Procedure Laterality Date   53 HOUR PH STUDY N/A 12/27/2022   Procedure: 24 HOUR PH STUDY;  Surgeon: Charlanne Groom, MD;  Location: [facility];  Service: Gastroenterology;  Laterality: N/A;  with impectence   ABDOMINAL HYSTERECTOMY     ANKLE FRACTURE SURGERY Left    Pins and screws   BREAST LUMPECTOMY Bilateral    left x 1, right x 2   CARPAL TUNNEL RELEASE Right    CARPAL TUNNEL RELEASE Left    COLONOSCOPY     ESOPHAGEAL MANOMETRY N/A 12/27/2022   Procedure: ESOPHAGEAL MANOMETRY (EM);  Surgeon: Charlanne Groom, MD;  Location: [facility];  Service: Gastroenterology;  Laterality: N/A;   FRACTURE SURGERY     KNEE ARTHROSCOPY Bilateral    PARTIAL HYSTERECTOMY     Family History  Problem Relation Age of Onset   Heart disease Mother    Diabetes Mother    Colon polyps Mother    Anxiety disorder Mother    Depression Mother    Colon cancer Father 3   Arthritis Father    Cancer Father    Gallbladder disease Daughter    ADD / ADHD Daughter    Gallstones Paternal Grandmother    Esophageal cancer Neg Hx    Stomach cancer Neg Hx    Rectal cancer Neg Hx    Breast cancer Neg Hx    Social History   Occupational History   Occupation: retired  Tobacco Use   Smoking status: Never   Smokeless tobacco: Never  Vaping Use   Vaping status: Never Used  Substance and Sexual Activity   Alcohol use: Not Currently   Drug use: No   Sexual activity: Yes    Tobacco Counseling Counseling given: Yes  SDOH Screenings   Food Insecurity: No Food Insecurity (01/17/2024)  Housing: Low Risk  (01/17/2024)  Transportation Needs: No Transportation Needs (01/17/2024)  Utilities: Not At Risk (01/17/2024)  Alcohol Screen: Low Risk  (09/10/2023)  Depression (PHQ2-9): Low Risk  (01/17/2024)  Financial Resource Strain: Low Risk  (09/10/2023)  Physical Activity: Sufficiently Active (01/17/2024)  Social Connections: Moderately Integrated (01/17/2024)  Stress: No Stress Concern Present (01/17/2024)  Tobacco Use: Low Risk  (01/17/2024)  Health Literacy: Adequate Health Literacy (01/17/2024)   See flowsheets for full screening details  Depression Screen PHQ 2 & 9 Depression Scale- Over the past 2 weeks, how often have you been bothered by any of the following problems? Little interest or pleasure in doing things: 0 Feeling down, depressed, or hopeless (PHQ Adolescent also includes...irritable): 0 PHQ-2 Total Score: 0 Trouble falling or staying asleep, or sleeping too much: 0 Feeling tired or having little energy: 0 Poor appetite or overeating (PHQ Adolescent also includes...weight loss): 0 Feeling bad about yourself - or that you are a failure or have let yourself or your family down: 0 Trouble concentrating on things, such  as reading the newspaper or watching television [facility] Adolescent also includes...like school work): 0 Moving or speaking so slowly that other people could have noticed. Or the opposite - being so fidgety or restless that you have been moving around a lot more than usual: 0 Thoughts that you would be better off dead, or of hurting yourself in some way: 0 PHQ-9 Total Score: 0 If you checked off any problems, how difficult have these problems made it for you to do your work, take care of things at home, or get along with other people?: Not difficult at all     Goals Addressed             This Visit's Progress    DIET - EAT MORE  FRUITS AND VEGETABLES   On track    Exercise 150 min/wk Moderate Activity   On track      Visit info / Clinical Intake: Medicare Wellness Visit Type:: Subsequent Annual Wellness Visit Persons participating in visit:: patient Medicare Wellness Visit Mode:: Telephone If telephone:: video declined Because this visit was a virtual/telehealth visit:: vitals recorded from last visit If Telephone or Video please confirm:: I connected with the patient using audio enabled telemedicine application and verified that I am speaking with the correct person using two identifiers Patient Location:: home Provider Location:: home office Information given by:: patient Interpreter Needed?: No Pre-visit prep was completed: yes AWV questionnaire completed by patient prior to visit?: no Living arrangements:: lives with spouse/significant other Patient's Overall Health Status Rating: very good Typical amount of pain: none Does pain affect daily life?: no Are you currently prescribed opioids?: no  Dietary Habits and Nutritional Risks How many meals a day?: 3 Eats fruit and vegetables daily?: yes Most meals are obtained by: preparing own meals In the last 2 weeks, have you had any of the following?: (!) nausea, vomiting, diarrhea Diabetic:: (!) yes Any non-healing wounds?: no How often do you check your BS?: 1 Would you like to be referred to a Nutritionist or for Diabetic Management? : no  Functional Status Activities of Daily Living (to include ambulation/medication): Independent Ambulation: Independent Medication Administration: Independent Home Management: Independent Manage your own finances?: yes Primary transportation is: driving Concerns about hearing?: no  Fall Screening Falls in the past year?: 0 Number of falls in past year: 0 Was there an injury with Fall?: 0 Fall Risk Category Calculator: 0 Patient Fall Risk Level: Low Fall Risk  Fall Risk Patient at Risk for Falls Due to: No  Fall Risks Fall risk Follow up: Falls evaluation completed; Education provided  Home and Transportation Safety: All rugs have non-skid backing?: yes All stairs or steps have railings?: yes Grab bars in the bathtub or shower?: yes Have non-skid surface in bathtub or shower?: yes Good home lighting?: yes Regular seat belt use?: yes Hospital stays in the last year:: no  Cognitive Assessment Difficulty concentrating, remembering, or making decisions? : no Will 6CIT or Mini Cog be Completed: yes What year is it?: 0 points What month is it?: 0 points Give patient an address phrase to remember (5 components): [address] [place], [place] what time is it?: 0 points Count backwards from 20 to 1: 0 points Say the months of the year in reverse: 0 points Repeat the address phrase from earlier: 0 points 6 CIT Score: 0 points  Advance Directives (For Healthcare) Does Patient Have a Medical Advance Directive?: No Would patient like information on creating a medical advance directive?: No -  Patient declined  Reviewed/Updated  Reviewed/Updated: Reviewed All (Medical, Surgical, Family, Medications, Allergies, Care Teams, Patient Goals); Medical History; Surgical History; Family History; Medications; Allergies; Care Teams; Patient Goals        Objective:    Today's Vitals   01/17/24 1032  BP: 129/74  Pulse: 71  Weight: 155 lb (70.3 kg)  Height: 5' 6 (1.676 m)   Body mass index is 25.02 kg/m.  Current Medications (verified) Outpatient Encounter Medications as of 01/17/2024  Medication Sig   albuterol  (VENTOLIN  HFA) 108 (90 Base) MCG/ACT inhaler 2 puff q6 prn   budesonide -formoterol  (SYMBICORT ) 80-4.5 MCG/ACT inhaler Take 2 puffs first thing in am and then another 2 puffs about 12 hours later.   celecoxib (CELEBREX) 100 MG capsule Take 100 mg by mouth daily.   dutasteride (AVODART) 0.5 MG capsule Take 0.5 mg by mouth daily.   escitalopram  (LEXAPRO ) 20 MG tablet Take 1 tablet (20 mg  total) by mouth daily.   Evolocumab  (REPATHA  SURECLICK) 140 MG/ML SOAJ INJECT 140 MG INTO THE SKIN EVERY 14 (FOURTEEN) DAYS.   famotidine  (PEPCID ) 20 MG tablet Take 1 tablet (20 mg total) by mouth 2 (two) times daily.   fluticasone  (FLONASE ) 50 MCG/ACT nasal spray Place 2 sprays into both nostrils daily.   hydrocortisone  (ANUSOL -HC) 2.5 % rectal cream Place 1 Application rectally 2 (two) times daily.   hydrocortisone  (ANUSOL -HC) 25 MG suppository Place 1 suppository (25 mg total) rectally 2 (two) times daily.   hyoscyamine  (LEVSIN  SL) 0.125 MG SL tablet TAKE 1 TABLET (0.125 MG TOTAL) BY MOUTH EVERY 6 (SIX) HOURS AS NEEDED.   linaclotide  (LINZESS ) 290 MCG CAPS capsule Take 1 capsule (290 mcg total) by mouth daily before breakfast.   LORATADINE -D 24HR 10-240 MG 24 hr tablet Take 1 tablet by mouth once daily   pantoprazole  (PROTONIX ) 20 MG tablet Take 1 tablet (20 mg total) by mouth 2 (two) times daily.   rosuvastatin  (CRESTOR ) 40 MG tablet Take 1 tablet (40 mg total) by mouth daily.   Semaglutide , 1 MG/DOSE, (OZEMPIC , 1 MG/DOSE,) 4 MG/3ML SOPN Inject 1 mg into the skin once a week.   VITAMIN D , CHOLECALCIFEROL, PO Take 1 tablet by mouth daily.    predniSONE  (STERAPRED UNI-PAK 21 TAB) 10 MG (21) TBPK tablet As directed x 6 days (Patient not taking: Reported on 01/17/2024)   No facility-administered encounter medications on file as of 01/17/2024.   Hearing/Vision screen Hearing Screening - Comments:: Starting to notice some hearing dif Vision Screening - Comments:: Pt wear glasses/pt goes [facility] in [place], [place] 2024 Immunizations and Health Maintenance Health Maintenance  Topic Date Due   OPHTHALMOLOGY EXAM  12/11/2023   COVID-19 Vaccine (4 - 2025-26 season) 01/30/2024   Diabetic kidney evaluation - Urine ACR  03/12/2024   HEMOGLOBIN A1C  03/13/2024   Fecal DNA (Cologuard)  03/21/2024   Mammogram  06/17/2024   Diabetic kidney evaluation - eGFR measurement  09/10/2024   FOOT EXAM   09/10/2024   Medicare Annual Wellness (AWV)  01/16/2025   DEXA SCAN  03/13/2025   DTaP/Tdap/Td (3 - Td or Tdap) 03/04/2031   Pneumococcal Vaccine: 50+ Years  Completed   Influenza Vaccine  Completed   Hepatitis C Screening  Completed   Zoster Vaccines- Shingrix   Completed   Meningococcal B Vaccine  Aged Out        Assessment/Plan:  This is a routine wellness examination for Luz.  Patient Care Team: Gladis Mustard, FNP as PCP - General (Family Medicine) Kate,  Lonni CROME, MD as PCP - Cardiology (Cardiology) Livingston Rigg, MD as Consulting Physician (Dermatology)  I have personally reviewed and noted the following in the patient's chart:   Medical and social history Use of alcohol, tobacco or illicit drugs  Current medications and supplements including opioid prescriptions. Functional ability and status Nutritional status Physical activity Advanced directives List of other physicians Hospitalizations, surgeries, and ER visits in previous 12 months Vitals Screenings to include cognitive, depression, and falls Referrals and appointments  No orders of the defined types were placed in this encounter.  In addition, I have reviewed and discussed with patient certain preventive protocols, quality metrics, and best practice recommendations. A written personalized care plan for preventive services as well as general preventive health recommendations were provided to patient.   Ozie Ned, CMA   01/17/2024   Return in 1 year (on 01/16/2025).  After Visit Summary: (MyChart) Due to this being a telephonic visit, the after visit summary with patients personalized plan was offered to patient via MyChart   Nurse Notes: pt is aware and due the following: diabetic eye exam, pt will make an appt  I have reviewed and agree with the above AWV documentation.   Mary-Margaret Gladis, FNP

## 2024-01-17 NOTE — Telephone Encounter (Signed)
 FYI Only or Action Required?: FYI only for provider: appointment scheduled on 01/21/24, added to wait list.  Patient was last seen in primary care on 10/16/2023 by Gladis Mustard, FNP.  Called Nurse Triage reporting Knee Pain.  Symptoms began several days ago.  Interventions attempted: Rest, hydration, or home remedies.  Symptoms are: unchanged.  Triage Disposition: See PCP When Office is Open (Within 3 Days)  Patient/caregiver understands and will follow disposition?: Yes, but will wait   Copied from CRM #[id number]. Topic: Clinical - Red Word Triage >> Jan 17, 2024  2:52 PM Rachelle R wrote: [facility] that prompted transfer to Nurse Triage: Patient states she has been having cramps behind her right knee and the area is swollen. Reason for Disposition  [1] MODERATE pain (e.g., interferes with normal activities, limping) AND [2] present > 3 days  Answer Assessment - Initial Assessment Questions Additional info: Offered next available acute visit on 01/18/24 but she declined this request and wants to see pcp only. Scheduled next available acute visit with pcp on 01/21/24, added to wait list. Patient will call back with any new or worsening symptoms.    1. LOCATION and RADIATION: Where is the pain located?      Behind right knee 2. QUALITY: What does the pain feel like?  (e.g., sharp, dull, aching, burning)     Muscle cramping intermittently 3. SEVERITY: How bad is the pain? What does it keep you from doing?   (Scale 1-10; or mild, moderate, severe)     3/10 at worst 4. ONSET: When did the pain start? Does it come and go, or is it there all the time?     Two days ago 5. RECURRENT: Have you had this pain before? If Yes, ask: When, and what happened then?     no 6. SETTING: Has there been any recent work, exercise or other activity that involved that part of the body?      no 7. AGGRAVATING FACTORS: What makes the knee pain worse? (e.g., walking, climbing  stairs, running)     Not identified 8. ASSOCIATED SYMPTOMS: Is there any swelling or redness of the knee?     Husband told her back of knee looks slightly puffy but patient does not feel she has any swelling.  9. OTHER SYMPTOMS: Do you have any other symptoms? (e.g., calf pain, chest pain, difficulty breathing, fever)     Denies redness, warmth, fever, chest pain, breathing difficulty and all other symptoms.  Protocols used: Knee Pain-A-AH

## 2024-01-17 NOTE — Telephone Encounter (Signed)
Noted  -LS

## 2024-01-21 ENCOUNTER — Ambulatory Visit (INDEPENDENT_AMBULATORY_CARE_PROVIDER_SITE_OTHER): Admitting: Nurse Practitioner

## 2024-01-21 VITALS — BP 119/73 | HR 84 | Temp 97.1°F | Ht 66.0 in | Wt 148.0 lb

## 2024-01-21 DIAGNOSIS — R252 Cramp and spasm: Secondary | ICD-10-CM | POA: Diagnosis not present

## 2024-01-21 LAB — OPHTHALMOLOGY REPORT-SCANNED

## 2024-01-21 NOTE — Patient Instructions (Signed)
 Leg Cramps: What They Mean Leg cramps happen when one or more muscles tighten and there's no control over it. They can happen during exercise or when you're resting. Leg cramps are painful and can last for a few seconds to minutes. They can also come back many times before stopping. Usually, leg cramps aren't caused by a serious medical problem. Often, the cause isn't known. Some common causes include: Problems with moving or not moving the body, like: Working your muscles too hard, such as during intense exercise. Doing the same motion over and over. Not warming up or stretching before playing sports or doing activities. Using the wrong technique or form when playing sports or doing activities. Staying in one position for a long time. Water or electrolyte balance issues, like: Not drinking enough fluids or being dehydrated. Getting sick from too much heat. Having low levels of minerals called electrolytes in your blood, like potassium and calcium . This can happen from: Pregnancy. Taking medicines that make you pee more, also called diuretic medicines. Not getting enough nutrients from your diet. Side effects of some medicines. Follow these instructions at home: Eating and drinking Eat and drink as told. Eat a healthy diet that includes plenty of nutrients to help your muscles work well. A healthy diet includes fruits and vegetables, lean protein, whole grains, and low-fat or nonfat dairy products. Drink enough fluids to keep your pee pale yellow. Drinking more water may help prevent cramps. Managing pain and muscle cramping     Massage, stretch, and relax the cramped muscle. Do this for several minutes at a time. Use ice or an ice pack as told. Place a towel between your skin and the ice. Leave the ice on for 20 minutes, 2-3 times a day. Use heat as told. Use the heat source that your provider recommends, such as a moist heat pack or a heating pad. Do this as often as told. Place a  towel between your skin and the heat source. Leave the heat on for 20-30 minutes. If your skin turns red, take off the ice or heat right away to prevent skin damage. The risk of damage is higher if you can't feel pain, heat, or cold. Take hot showers or baths to help relax tight muscles. General instructions If you're having a lot of leg cramps, avoid hard workouts for several days. Take supplements and medicines only as told. Contact a health care provider if: Your leg cramps get worse or happen more often. Your leg cramps don't get better over time. Your foot becomes cold, numb, or blue. This information is not intended to replace advice given to you by your health care provider. Make sure you discuss any questions you have with your health care provider. Document Revised: 02/09/2023 Document Reviewed: 11/01/2022 Elsevier Patient Education  2025 [employer].

## 2024-01-21 NOTE — Progress Notes (Signed)
   Subjective:    Patient ID: Crystal Wong, female    DOB: June 28, 1955, 68 y.o.   MRN: [id number]   Chief Complaint: Weakness and pain in back of right leg   Patient says that last week she started having cramps in the back of her right leg, mainly in knee area. It did not do it over the weekend. Says she could rub the area  and cramp would go away. Occurred 3-4x a day on Thursday and Friday. Would last around 1 hour. Her husband said her calf was swollen but she did not think it was.    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  Eyes:  Negative for pain.  Respiratory:  Negative for shortness of breath.   Cardiovascular:  Negative for chest pain, palpitations and leg swelling.  Gastrointestinal:  Negative for abdominal pain.  Endocrine: Negative for polydipsia.  Skin:  Negative for rash.  Neurological:  Positive for weakness. Negative for dizziness and headaches.  Hematological:  Does not bruise/bleed easily.  All other systems reviewed and are negative.      Objective:   Physical Exam Constitutional:      Appearance: Normal appearance.  Cardiovascular:     Rate and Rhythm: Normal rate and regular rhythm.  Pulmonary:     Effort: Pulmonary effort is normal.     Breath sounds: Normal breath sounds.  Musculoskeletal:     Comments: No posterior knee tenderness No Right calf  Negative homan sign on right 2+ pedal pulse right foot  Skin:    General: Skin is warm.   Neurological:     General: No focal deficit present.     Mental Status: She is alert and oriented to person, place, and time.  Psychiatric:        Mood and Affect: Mood normal.        Behavior: Behavior normal.     BP 119/73   Pulse 84   Temp (!) 97.1 F (36.2 C) (Temporal)   Ht 5' 6 (1.676 m)   Wt 148 lb (67.1 kg)   BMI 23.89 kg/m        Assessment & Plan:   Crystal Wong in today with chief complaint of Weakness and pain in back of right leg   1. Muscle cramps (Primary) Message if occurs Vinegar or mustard- club soda may also help If reoccurs will order ultra sound    The above assessment and management plan was discussed with the patient. The patient verbalized understanding of and has agreed to the management plan. Patient is aware to call the clinic if symptoms persist or worsen. Patient is aware when to return to the clinic for a follow-up visit. Patient educated on when it is appropriate to go to the emergency department.   Mary-Margaret Gladis, FNP

## 2024-02-26 ENCOUNTER — Telehealth: Admitting: Nurse Practitioner

## 2024-02-26 DIAGNOSIS — J019 Acute sinusitis, unspecified: Secondary | ICD-10-CM

## 2024-02-26 DIAGNOSIS — B9789 Other viral agents as the cause of diseases classified elsewhere: Secondary | ICD-10-CM

## 2024-02-26 MED ORDER — NAPROXEN 500 MG PO TABS: 500.0000 mg | ORAL_TABLET | Freq: Two times a day (BID) | ORAL | 0 refills | Status: DC

## 2024-02-26 MED ORDER — BENZONATATE 100 MG PO CAPS: 100.0000 mg | ORAL_CAPSULE | Freq: Three times a day (TID) | ORAL | 0 refills | Status: AC | PRN

## 2024-02-26 MED ORDER — FLUTICASONE PROPIONATE 50 MCG/ACT NA SUSP: 2.0000 | Freq: Every day | NASAL | 0 refills | Status: AC

## 2024-02-26 NOTE — Progress Notes (Signed)
 E-Visit for COVID/Influenza Screening  Your current symptoms could be consistent with COVID-19 or Influenza. Please complete a COVID + Flu Test at home, or check with your local pharmacy to see if they provide testing.   If you have tested positive for either COVID or Influenza, it means that you were infected with that particular virus and could give the virus to others.  Most people with these infections have a mild illness and can recover at home without medical care. Do not leave your home, except to get medical care.  DO not visit public areas and do not go to places where you are unable to wear a mask. It is important for you to stay home to take care of yourself and to help protect other people in your home and community.   Isolation Instructions:  You are to isolate at home for now until you have taken your home COVID/Flu test and notified our team of your results, at which time further isolation instructions will be given.  If you must be around other household members who do not have symptoms, you need to make sure that both you and the family members are masking consistently with a high-quality mask, even while in the home.  If you note any worsening of symptoms despite treatment, please seek an IN-PERSON evaluation ASAP. If you note any significant shortness of breath or any chest pain, please seek immediate ER evaluation. Please do not delay care!  Go to the nearest hospital ED for assessment if fever/cough/breathlessness are severe or illness seems like a threat to life.    The following symptoms may appear 2-14 days after exposure: Fever Cough Shortness of breath or difficulty breathing Chills Repeated shaking with chills Muscle pain Headache Sore throat New loss of taste or smell Fatigue Congestion or runny nose Nausea or vomiting Diarrhea   For symptoms,  I have prescribed Tessalon  Perles 100 mg. You may take 1-2 capsules every 8 hours as needed for cough, I have  prescribed an anti-inflammatory - Naprosyn  500 mg. Take twice daily as needed for fever or body aches for 2 weeks, and I have prescribed Fluticasone  nasal spray 2 sprays in each nostril one time per dayasal spray 2 sprays in each nostril one time per day You may also take acetaminophen (Tylenol) as needed for fever.   Reduce your risk of any infection by using the same precautions used for avoiding the common cold or flu:  Wash your hands often with soap and warm water for at least 20 seconds.  If soap and water are not readily available, use an alcohol-based hand sanitizer with at least 60% alcohol.  If coughing or sneezing, cover your mouth and nose by coughing or sneezing into the elbow areas of your shirt or coat, into a tissue or into your sleeve (not your hands). Avoid shaking hands with others and consider head nods or verbal greetings only. Avoid touching your eyes, nose, or mouth with unwashed hands.  Avoid close contact with people who are sick. Avoid places or events with large numbers of people in one location, like concerts or sporting events. Carefully consider travel plans you have or are making. If you are planning any travel outside or inside the [place] , visit the CDC's Travelers' Health webpage for the latest health notices. If you have some symptoms but not all symptoms, continue to monitor at home and seek medical attention if your symptoms worsen. If you are having a medical emergency, call 911.  HOME  CARE Only take medications as instructed by your medical team. Drink plenty of fluids and get plenty of rest. A steam or ultrasonic humidifier can help if you have congestion.   GET HELP RIGHT AWAY IF YOU HAVE EMERGENCY WARNING SIGNS** FOR COVID-19. If you or someone is showing any of these signs seek emergency medical care immediately. Call 911 or proceed to your closest emergency facility if: You develop worsening high fever. Trouble breathing. Bluish lips or face. Persistent  pain or pressure in the chest. New confusion. Inability to wake or stay awake. You cough up blood. Your symptoms become more severe.  **This list is not all possible symptoms. Contact your medical provider for any symptoms that are sever or concerning to you.   MAKE SURE YOU  Understand these instructions. Will watch your condition. Will get help right away if you are not doing well or get worse.  Your e-visit answers were reviewed by a board certified advanced clinical practitioner to complete your personal care plan.  Depending on the condition, your plan could have included both over the counter or prescription medications.  If there is a problem, please reply once you have received a response from your provider.  Your safety is important to us .  If you have drug allergies check your prescription carefully.    You can use MyChart to ask questions about today's visit, request a non-urgent call back, or ask for a work or school excuse for 24 hours related to this e-Visit. If it has been greater than 24 hours you will need to follow up with your provider, or enter a new e-Visit to address those concerns. You will get an e-mail in the next two days asking about your experience.  I hope that your e-visit has been valuable and will speed your recovery. Thank you for using e-visits.   I have spent 5 minutes in review of e-visit questionnaire, review and updating patient chart, medical decision making and response to patient.   Delon CHRISTELLA Dickinson, PA-C

## 2024-03-01 ENCOUNTER — Telehealth: Admitting: Nurse Practitioner

## 2024-03-01 DIAGNOSIS — J069 Acute upper respiratory infection, unspecified: Secondary | ICD-10-CM

## 2024-03-01 NOTE — Progress Notes (Signed)
" ° °  Thank you for the details you included in the comment boxes. Those details are very helpful in determining the best course of treatment for you and help us  to provide the best care.Because you had an evisit on 02-26-2024 with different symptoms compared to now we would like to speak to you in person to get a better understanding of what you are actually experiencing and how to best treat your symptoms, we recommend that you schedule a Virtual Urgent Care video visit in order for the provider to better assess what is going on.  The provider will be able to give you a more accurate diagnosis and treatment plan if we can more freely discuss your symptoms and with the addition of a virtual examination.   If you change your visit to a video visit, we will bill your insurance (similar to an office visit) and you will not be charged for this e-Visit. You will be able to stay at home and speak with the first available [facility] advanced practice provider. The link to do a video visit is in the drop down Menu tab of your Welcome screen in MyChart.    "

## 2024-03-02 ENCOUNTER — Telehealth

## 2024-03-02 DIAGNOSIS — J45991 Cough variant asthma: Secondary | ICD-10-CM

## 2024-03-02 DIAGNOSIS — J101 Influenza due to other identified influenza virus with other respiratory manifestations: Secondary | ICD-10-CM | POA: Diagnosis not present

## 2024-03-02 MED ORDER — PROMETHAZINE-DM 6.25-15 MG/5ML PO SYRP: 5.0000 mL | ORAL_SOLUTION | Freq: Four times a day (QID) | ORAL | 0 refills | Status: DC | PRN

## 2024-03-02 MED ORDER — ONDANSETRON 4 MG PO TBDP: 4.0000 mg | ORAL_TABLET | Freq: Three times a day (TID) | ORAL | 0 refills | Status: DC | PRN

## 2024-03-02 MED ORDER — OSELTAMIVIR PHOSPHATE 6 MG/ML PO SUSR: 75.0000 mg | Freq: Two times a day (BID) | ORAL | 0 refills | Status: AC

## 2024-03-02 MED ORDER — PREDNISONE 20 MG PO TABS: 40.0000 mg | ORAL_TABLET | Freq: Every day | ORAL | 0 refills | Status: DC

## 2024-03-02 NOTE — Patient Instructions (Signed)
 " Crystal Wong, thank you for joining Crystal CHRISTELLA Dickinson, PA-C for today's virtual visit.  While this provider is not your primary care provider (PCP), if your PCP is located in our provider database this encounter information will be shared with them immediately following your visit.   A [facility] MyChart account gives you access to today's visit and all your visits, tests, and labs performed at [facility]  click here if you don't have a [facility] MyChart account or go to [email or web address]  Consent: (Patient) Crystal Wong provided verbal consent for this virtual visit at the beginning of the encounter.  Current Medications:  Current Outpatient Medications:    ondansetron  (ZOFRAN -ODT) 4 MG disintegrating tablet, Take 1 tablet (4 mg total) by mouth every 8 (eight) hours as needed., Disp: 20 tablet, Rfl: 0   oseltamivir  (TAMIFLU ) 6 MG/ML SUSR suspension, Take 12.5 mLs (75 mg total) by mouth 2 (two) times daily for 5 days., Disp: 125 mL, Rfl: 0   predniSONE  (DELTASONE ) 20 MG tablet, Take 2 tablets (40 mg total) by mouth daily with breakfast., Disp: 10 tablet, Rfl: 0   promethazine -dextromethorphan (PROMETHAZINE -DM) 6.25-15 MG/5ML syrup, Take 5 mLs by mouth 4 (four) times daily as needed., Disp: 118 mL, Rfl: 0   albuterol  (VENTOLIN  HFA) 108 (90 Base) MCG/ACT inhaler, 2 puff q6 prn, Disp: 20.1 g, Rfl: 2   benzonatate  (TESSALON ) 100 MG capsule, Take 1-2 capsules (100-200 mg total) by mouth 3 (three) times daily as needed., Disp: 30 capsule, Rfl: 0   budesonide -formoterol  (SYMBICORT ) 80-4.5 MCG/ACT inhaler, Take 2 puffs first thing in am and then another 2 puffs about 12 hours later., Disp: 1 each, Rfl: 12   celecoxib (CELEBREX) 100 MG capsule, Take 100 mg by mouth daily., Disp: , Rfl:    dutasteride (AVODART) 0.5 MG capsule, Take 0.5 mg by mouth daily., Disp: , Rfl:    escitalopram  (LEXAPRO ) 20 MG tablet, Take 1 tablet (20 mg total) by mouth daily., Disp: 90 tablet, Rfl: 1    Evolocumab  (REPATHA  SURECLICK) 140 MG/ML SOAJ, INJECT 140 MG INTO THE SKIN EVERY 14 (FOURTEEN) DAYS., Disp: 6 mL, Rfl: 3   famotidine  (PEPCID ) 20 MG tablet, Take 1 tablet (20 mg total) by mouth 2 (two) times daily., Disp: 180 tablet, Rfl: 1   fluticasone  (FLONASE ) 50 MCG/ACT nasal spray, Place 2 sprays into both nostrils daily., Disp: 16 g, Rfl: 0   hydrocortisone  (ANUSOL -HC) 2.5 % rectal cream, Place 1 Application rectally 2 (two) times daily., Disp: 30 g, Rfl: 2   hydrocortisone  (ANUSOL -HC) 25 MG suppository, Place 1 suppository (25 mg total) rectally 2 (two) times daily., Disp: 12 suppository, Rfl: 0   hyoscyamine  (LEVSIN  SL) 0.125 MG SL tablet, TAKE 1 TABLET (0.125 MG TOTAL) BY MOUTH EVERY 6 (SIX) HOURS AS NEEDED., Disp: 360 tablet, Rfl: 0   linaclotide  (LINZESS ) 290 MCG CAPS capsule, Take 1 capsule (290 mcg total) by mouth daily before breakfast., Disp: 30 capsule, Rfl: 5   LORATADINE -D 24HR 10-240 MG 24 hr tablet, Take 1 tablet by mouth once daily, Disp: 90 tablet, Rfl: 1   naproxen  (NAPROSYN ) 500 MG tablet, Take 1 tablet (500 mg total) by mouth 2 (two) times daily with a meal., Disp: 30 tablet, Rfl: 0   pantoprazole  (PROTONIX ) 20 MG tablet, Take 1 tablet (20 mg total) by mouth 2 (two) times daily., Disp: 180 tablet, Rfl: 1   rosuvastatin  (CRESTOR ) 40 MG tablet, Take 1 tablet (40 mg total) by mouth daily., Disp: 90 tablet, Rfl:  1   Semaglutide , 1 MG/DOSE, (OZEMPIC , 1 MG/DOSE,) 4 MG/3ML SOPN, Inject 1 mg into the skin once a week., Disp: 9 mL, Rfl: 1   VITAMIN D , CHOLECALCIFEROL, PO, Take 1 tablet by mouth daily. , Disp: , Rfl:    Medications ordered in this encounter:  Meds ordered this encounter  Medications   oseltamivir  (TAMIFLU ) 6 MG/ML SUSR suspension    Sig: Take 12.5 mLs (75 mg total) by mouth 2 (two) times daily for 5 days.    Dispense:  125 mL    Refill:  0    Supervising Provider:   BLAISE ALEENE KIDD [[id number]]   predniSONE  (DELTASONE ) 20 MG tablet    Sig: Take 2 tablets (40 mg  total) by mouth daily with breakfast.    Dispense:  10 tablet    Refill:  0    Supervising Provider:   LAMPTEY, PHILIP O [[id number]]   promethazine -dextromethorphan (PROMETHAZINE -DM) 6.25-15 MG/5ML syrup    Sig: Take 5 mLs by mouth 4 (four) times daily as needed.    Dispense:  118 mL    Refill:  0    Supervising Provider:   BLAISE ALEENE KIDD [[id number]]   ondansetron  (ZOFRAN -ODT) 4 MG disintegrating tablet    Sig: Take 1 tablet (4 mg total) by mouth every 8 (eight) hours as needed.    Dispense:  20 tablet    Refill:  0    Supervising Provider:   LAMPTEY, PHILIP O [[id number]]     *If you need refills on other medications prior to your next appointment, please contact your pharmacy*  Follow-Up: Call back or seek an in-person evaluation if the symptoms worsen or if the condition fails to improve as anticipated.  [facility] [phone]  Other Instructions Influenza, Adult Influenza is also called the flu. It's an infection that affects your respiratory tract. This includes your nose, throat, windpipe, and lungs. The flu is contagious. This means it spreads easily from person to person. It causes symptoms that are like a cold. It can also cause a high fever and body aches. What are the causes? The flu is caused by the influenza virus. You can get it by: Breathing in droplets that are in the air after an infected person coughs or sneezes. Touching something that has the virus on it and then touching your mouth, nose, or eyes. What increases the risk? You may be more likely to get the flu if: You don't wash your hands often. You're near a lot of people during cold and flu season. You touch your mouth, eyes, or nose without washing your hands first. You don't get a flu shot each year. You may also be more at risk for the flu and serious problems, such as a lung infection called pneumonia, if: You're older than 65. You're pregnant. Your immune system is weak. Your immune  system is your body's defense system. You have a long-term, or chronic, condition, such as: Heart, kidney, or lung disease. Diabetes. A liver disorder. Asthma. You're very overweight. You have anemia. This is when you don't have enough red blood cells in your body. What are the signs or symptoms? Flu symptoms often start all of a sudden. They may last 4-14 days and include: Fever and chills. Headaches, body aches, or muscle aches. Sore throat. Cough. Runny or stuffy nose. Discomfort in your chest. Not wanting to eat as much as normal. Feeling weak or tired. Feeling dizzy. Nausea or vomiting. How is this diagnosed? The flu  may be diagnosed based on your symptoms and medical history. You may also have a physical exam. A swab may be taken from your nose or throat and tested for the virus. How is this treated? If the flu is found early, you can be treated with antiviral medicine. This may be given to you by mouth or through an IV. It can help you feel less sick and get better faster. Taking care of yourself at home can also help your symptoms get better. Your health care provider may tell you to: Take over-the-counter medicines. Drink lots of fluids. The flu often goes away on its own. If you have very bad symptoms or problems caused by the flu, you may need to be treated in a hospital. Follow these instructions at home: Activity Rest as needed. Get lots of sleep. Stay home from work or school as told by your provider. Leave home only to go see your provider. Do not leave home for other reasons until you don't have a fever for 24 hours without taking medicine. Eating and drinking Take an oral rehydration solution (ORS). This is a drink that is sold at pharmacies and stores. Drink enough fluid to keep your pee pale yellow. Try to drink small amounts of clear fluids. These include water, ice chips, fruit juice mixed with water, and low-calorie sports drinks. Try to eat bland foods  that are easy to digest. These include bananas, applesauce, rice, lean meats, toast, and crackers. Avoid drinks that have a lot of sugar or caffeine in them. These include energy drinks, regular sports drinks, and soda. Do not drink alcohol. Do not eat spicy or fatty foods. General instructions     Take your medicines only as told by your provider. Use a cool mist humidifier to add moisture to the air in your home. This can make it easier for you to breathe. You should also clean the humidifier every day. To do so: Empty the water. Pour clean water in. Cover your mouth and nose when you cough or sneeze. Wash your hands with soap and water often and for at least 20 seconds. It's extra important to do so after you cough or sneeze. If you can't use soap and water, use hand sanitizer. How is this prevented?  Get a flu shot every year. Ask your provider when you should get your flu shot. Stay away from people who are sick during fall and winter. Fall and winter are cold and flu season. Contact a health care provider if: You get new symptoms. You have chest pain. You have watery poop, also called diarrhea. You have a fever. Your cough gets worse. You start to have more mucus. You feel like you may vomit, or you vomit. Get help right away if: You become short of breath or have trouble breathing. Your skin or nails turn blue. You have very bad pain or stiffness in your neck. You get a sudden headache or pain in your face or ear. You vomit each time you eat or drink. These symptoms may be an emergency. Call 911 right away. Do not wait to see if the symptoms will go away. Do not drive yourself to the hospital. This information is not intended to replace advice given to you by your health care provider. Make sure you discuss any questions you have with your health care provider. Document Revised: 11/23/2022 Document Reviewed: 03/30/2022 Elsevier Patient Education  2024 Elsevier  Inc.   If you have been instructed to have an  in-person evaluation today at a local Urgent Care facility, please use the link below. It will take you to a list of all of our available [facility], including address, phone number and hours of operation. Please do not delay care.  [facility]  If you or a family member do not have a primary care provider, use the link below to schedule a visit and establish care. When you choose a [facility] primary care physician or advanced practice provider, you gain a long-term partner in health. Find a Primary Care Provider  Learn more about [facility]'s in-office and virtual care options: [facility] - Get Care Now "

## 2024-03-02 NOTE — Progress Notes (Signed)
 " Virtual Visit Consent   Crystal Wong, you are scheduled for a virtual visit with a Crystal Wong provider today. Just as with appointments in the office, your consent must be obtained to participate. Your consent will be active for this visit and any virtual visit you may have with one of our providers in the next 365 days. If you have a MyChart account, a copy of this consent can be sent to you electronically.  As this is a virtual visit, video technology does not allow for your provider to perform a traditional examination. This may limit your provider's ability to fully assess your condition. If your provider identifies any concerns that need to be evaluated in person or the need to arrange testing (such as labs, EKG, etc.), we will make arrangements to do so. Although advances in technology are sophisticated, we cannot ensure that it will always work on either your end or our end. If the connection with a video visit is poor, the visit may have to be switched to a telephone visit. With either a video or telephone visit, we are not always able to ensure that we have a secure connection.  By engaging in this virtual visit, you consent to the provision of healthcare and authorize for your insurance to be billed (if applicable) for the services provided during this visit. Depending on your insurance coverage, you may receive a charge related to this service.  I need to obtain your verbal consent now. Are you willing to proceed with your visit today? Jenkins JAYSON Borer has provided verbal consent on 03/02/2024 for a virtual visit (video or telephone). Delon CHRISTELLA Dickinson, PA-C  Date: 03/02/2024 2:59 PM   Virtual Visit via Video Note   I, Delon CHRISTELLA Dickinson, connected with  RONNETTE RUMP  ([id number], 09-Feb-1956) on 03/02/2024 at  2:45 PM EST by a video-enabled telemedicine application and verified that I am speaking with the correct person using two identifiers.  Location: Patient: Virtual Visit Location  Patient: Home Provider: Virtual Visit Location Provider: Home Office   I discussed the limitations of evaluation and management by telemedicine and the availability of in person appointments. The patient expressed understanding and agreed to proceed.    History of Present Illness: Crystal Wong is a 68 y.o. who identifies as a female who was assigned female at birth, and is being seen today for flu like symptoms.  HPI: URI  This is a new problem. The current episode started in the past 7 days (Symptoms started 3 days ago). The problem has been unchanged. The maximum temperature recorded prior to her arrival was 103 - 104 F (103.4 highest, 101 now). Associated symptoms include congestion, coughing, ear pain, headaches, a plugged ear sensation, rhinorrhea (and post nasal drainage), sinus pain and a sore throat. Pertinent negatives include no diarrhea, nausea or vomiting. Associated symptoms comments: myalgias. She has tried acetaminophen  (mucinex , tylenol , nyquil, clear fluids) for the symptoms. The treatment provided no relief.   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  Resting comfortably at home.  Head is normocephalic, atraumatic.  No labored breathing.  Speech is clear and coherent with logical content.  Patient is alert and oriented  at baseline.    Assessment and Plan: 1. Influenza A (Primary) - oseltamivir  (TAMIFLU ) 6 MG/ML SUSR suspension; Take 12.5 mLs (75 mg total) by mouth 2 (two) times daily for 5 days.  Dispense: 125 mL; Refill: 0 - predniSONE  (DELTASONE ) 20 MG tablet; Take 2 tablets (40 mg total) by mouth daily with breakfast.  Dispense: 10 tablet; Refill: 0 - promethazine -dextromethorphan (PROMETHAZINE -DM) 6.25-15 MG/5ML syrup; Take 5 mLs by mouth 4 (four) times daily as needed.  Dispense: 118 mL; Refill: 0 - ondansetron  (ZOFRAN -ODT) 4 MG disintegrating tablet; Take 1 tablet (4 mg total) by mouth every 8 (eight) hours as needed.  Dispense: 20 tablet; Refill: 0  2. Cough variant asthma - predniSONE  (DELTASONE ) 20 MG tablet; Take 2 tablets (40 mg total) by mouth daily with breakfast.  Dispense: 10 tablet; Refill: 0 - promethazine -dextromethorphan (PROMETHAZINE -DM) 6.25-15 MG/5ML syrup; Take 5 mLs by mouth 4 (four) times daily as needed.  Dispense: 118 mL; Refill: 0  - Suspect influenza due to symptoms and positive exposure - Tamiflu  prescribed since still having severe symptoms and at increased risk for complications - Promethazine  DM for cough - Zofran  for nausea - Prednisone  added for cough variant asthma exacerbation - Continue OTC medication of choice for symptomatic management - Push fluids - Rest - Seek in person evaluation if symptoms worsen or fail to improve   Follow Up Instructions: I discussed the assessment and treatment plan with the patient. The patient was provided an opportunity to ask questions and all were answered. The patient agreed with the plan and demonstrated an understanding of the instructions.  A copy of instructions were sent to the patient via MyChart unless otherwise noted below.    The patient was advised to call back or seek an in-person evaluation if the symptoms worsen or if the condition fails to improve as anticipated.    Delon CHRISTELLA Dickinson, PA-C     [1]   Allergies Allergen Reactions   Sulfa Antibiotics Anaphylaxis   Sulfamethoxazole-Trimethoprim Swelling   Elemental Sulfur Other (See Comments)    Other reaction(s): Angioedema (ALLERGY/intolerance) Other reaction(s): Angioedema (ALLERGY/intolerance)  [2]  Current Outpatient Medications:    ondansetron  (ZOFRAN -ODT) 4 MG disintegrating tablet, Take 1 tablet (4 mg total) by mouth every 8 (eight) hours as needed., Disp: 20 tablet, Rfl: 0   oseltamivir  (TAMIFLU ) 6 MG/ML SUSR suspension, Take 12.5 mLs (75 mg total) by mouth 2 (two) times daily for 5 days., Disp: 125 mL, Rfl: 0   predniSONE  (DELTASONE ) 20 MG tablet, Take 2 tablets (40 mg total) by mouth daily with breakfast., Disp: 10 tablet, Rfl: 0   promethazine -dextromethorphan (PROMETHAZINE -DM) 6.25-15 MG/5ML syrup, Take 5 mLs by mouth 4 (four) times daily as needed., Disp: 118 mL, Rfl: 0   albuterol  (VENTOLIN  HFA) 108 (90 Base) MCG/ACT inhaler, 2 puff q6 prn, Disp: 20.1 g, Rfl: 2   benzonatate  (TESSALON ) 100 MG capsule, Take 1-2 capsules (100-200 mg total) by mouth 3 (three) times daily as needed., Disp: 30 capsule, Rfl: 0   budesonide -formoterol  (SYMBICORT ) 80-4.5 MCG/ACT inhaler, Take 2 puffs first thing in am and then another 2 puffs about 12 hours later., Disp: 1 each, Rfl: 12   celecoxib (CELEBREX) 100 MG capsule, Take 100 mg by mouth daily., Disp: , Rfl:  dutasteride (AVODART) 0.5 MG capsule, Take 0.5 mg by mouth daily., Disp: , Rfl:    escitalopram  (LEXAPRO ) 20 MG tablet, Take 1 tablet (20 mg total) by mouth daily., Disp: 90 tablet, Rfl: 1   Evolocumab  (REPATHA  SURECLICK) 140 MG/ML SOAJ, INJECT 140 MG INTO THE SKIN EVERY 14 (FOURTEEN) DAYS., Disp: 6 mL, Rfl: 3   famotidine  (PEPCID ) 20 MG tablet, Take 1 tablet (20 mg total) by mouth 2 (two) times daily., Disp: 180 tablet, Rfl: 1   fluticasone  (FLONASE ) 50 MCG/ACT nasal spray, Place 2 sprays into both nostrils daily., Disp: 16 g, Rfl: 0   hydrocortisone  (ANUSOL -HC) 2.5 % rectal cream,  Place 1 Application rectally 2 (two) times daily., Disp: 30 g, Rfl: 2   hydrocortisone  (ANUSOL -HC) 25 MG suppository, Place 1 suppository (25 mg total) rectally 2 (two) times daily., Disp: 12 suppository, Rfl: 0   hyoscyamine  (LEVSIN  SL) 0.125 MG SL tablet, TAKE 1 TABLET (0.125 MG TOTAL) BY MOUTH EVERY 6 (SIX) HOURS AS NEEDED., Disp: 360 tablet, Rfl: 0   linaclotide  (LINZESS ) 290 MCG CAPS capsule, Take 1 capsule (290 mcg total) by mouth daily before breakfast., Disp: 30 capsule, Rfl: 5   LORATADINE -D 24HR 10-240 MG 24 hr tablet, Take 1 tablet by mouth once daily, Disp: 90 tablet, Rfl: 1   naproxen  (NAPROSYN ) 500 MG tablet, Take 1 tablet (500 mg total) by mouth 2 (two) times daily with a meal., Disp: 30 tablet, Rfl: 0   pantoprazole  (PROTONIX ) 20 MG tablet, Take 1 tablet (20 mg total) by mouth 2 (two) times daily., Disp: 180 tablet, Rfl: 1   rosuvastatin  (CRESTOR ) 40 MG tablet, Take 1 tablet (40 mg total) by mouth daily., Disp: 90 tablet, Rfl: 1   Semaglutide , 1 MG/DOSE, (OZEMPIC , 1 MG/DOSE,) 4 MG/3ML SOPN, Inject 1 mg into the skin once a week., Disp: 9 mL, Rfl: 1   VITAMIN D , CHOLECALCIFEROL, PO, Take 1 tablet by mouth daily. , Disp: , Rfl:   "

## 2024-03-03 ENCOUNTER — Other Ambulatory Visit: Payer: Self-pay | Admitting: Nurse Practitioner

## 2024-03-03 MED ORDER — OSELTAMIVIR PHOSPHATE 75 MG PO CAPS: 75.0000 mg | ORAL_CAPSULE | Freq: Two times a day (BID) | ORAL | 0 refills | Status: DC

## 2024-03-03 NOTE — Telephone Encounter (Signed)
 Will send in tamiflu  prescription

## 2024-03-07 ENCOUNTER — Ambulatory Visit: Payer: Self-pay

## 2024-03-07 NOTE — Telephone Encounter (Signed)
 FYI Only or Action Required?: FYI only for provider: Advised UC in meantime, conjoined Monday and Tuesday appts, 1/5 appt.  Patient was last seen in primary care on 03/02/2024 by Vivienne Delon HERO, PA-C.  Called Nurse Triage reporting Urinary Frequency and flu symptoms with Flu-A.  Symptoms began several days ago.  Interventions attempted: Prescription medications: steroids, cough med, tamiflu  and Rest, hydration, or home remedies.  Symptoms are: gradually improving.  Triage Disposition: See Physician Within 24 Hours (overriding See PCP Within 2 Weeks)  Patient/caregiver understands and will follow disposition?: Yes    Message from [facility] C sent at 03/07/2024  8:05 AM EST  Summary: flu like symptoms / frequent urination   Reason for Triage: The patient would like to speak with a member of clinical staff about ongoing flu like symptoms and frequent urination that they're currently experiencing. The patient shares that they have a history of diabetic concerns as well and would like to ensure that this is not kidney related.         Reason for Disposition  All other urine symptoms  Answer Assessment - Initial Assessment Questions This RN recommended pt be examined today, in agreement with doc who advised this as pt reported, no availability, advised go to UC or call back if new or worsening symptoms. Conjoined appts for Monday and Tuesday per pt request.    Flu since last Friday On tamiflu  Flu type A, been really rough Had high fever Had horrible headaches Was throwing up Aches and pains, fatigue and cough On steroids and cough meds, given pain meds but not needed, headaches stopped about wednesday Urinary frequency, been feeling need to go more often, not that concerned Wondering if from kidneys or from tamiflu , nurse told me to stay on it, he wanted me to see doc today but no appts Really don't think UTI, doesn't feel like that No SOB or chest pain Been coughing up  phlegm Not finding it difficult to breathe Not coughing up blood or colored mucus Feel like at tail end  Someone called her this morning, [facility] nurse talked to her and told her to stay on tamiflu  No more high fevers No more vomiting No blood in urine, no flank pain  Protocols used: Urinary Symptoms-A-AH

## 2024-03-10 ENCOUNTER — Encounter: Payer: Self-pay | Admitting: Nurse Practitioner

## 2024-03-10 ENCOUNTER — Ambulatory Visit: Payer: Self-pay | Admitting: Nurse Practitioner

## 2024-03-10 VITALS — BP 118/79 | HR 90 | Temp 97.9°F | Ht 66.0 in | Wt 142.0 lb

## 2024-03-10 DIAGNOSIS — K2100 Gastro-esophageal reflux disease with esophagitis, without bleeding: Secondary | ICD-10-CM

## 2024-03-10 DIAGNOSIS — E782 Mixed hyperlipidemia: Secondary | ICD-10-CM

## 2024-03-10 DIAGNOSIS — J45991 Cough variant asthma: Secondary | ICD-10-CM

## 2024-03-10 DIAGNOSIS — F411 Generalized anxiety disorder: Secondary | ICD-10-CM | POA: Diagnosis not present

## 2024-03-10 DIAGNOSIS — F3342 Major depressive disorder, recurrent, in full remission: Secondary | ICD-10-CM

## 2024-03-10 DIAGNOSIS — Z6833 Body mass index (BMI) 33.0-33.9, adult: Secondary | ICD-10-CM

## 2024-03-10 DIAGNOSIS — Z7985 Long-term (current) use of injectable non-insulin antidiabetic drugs: Secondary | ICD-10-CM | POA: Diagnosis not present

## 2024-03-10 DIAGNOSIS — F5101 Primary insomnia: Secondary | ICD-10-CM

## 2024-03-10 DIAGNOSIS — K5901 Slow transit constipation: Secondary | ICD-10-CM

## 2024-03-10 DIAGNOSIS — E119 Type 2 diabetes mellitus without complications: Secondary | ICD-10-CM | POA: Diagnosis not present

## 2024-03-10 LAB — CBC WITH DIFFERENTIAL/PLATELET
Basophils Absolute: 0 x10E3/uL (ref 0.0–0.2)
Basos: 1 %
EOS (ABSOLUTE): 0.1 x10E3/uL (ref 0.0–0.4)
Eos: 2 %
Hematocrit: 43 % (ref 34.0–46.6)
Hemoglobin: 14.4 g/dL (ref 11.1–15.9)
Immature Grans (Abs): 0 x10E3/uL (ref 0.0–0.1)
Immature Granulocytes: 0 %
Lymphocytes Absolute: 2 x10E3/uL (ref 0.7–3.1)
Lymphs: 30 %
MCH: 29.7 pg (ref 26.6–33.0)
MCHC: 33.5 g/dL (ref 31.5–35.7)
MCV: 89 fL (ref 79–97)
Monocytes Absolute: 0.4 x10E3/uL (ref 0.1–0.9)
Monocytes: 7 %
Neutrophils Absolute: 4 x10E3/uL (ref 1.4–7.0)
Neutrophils: 59 %
Platelets: 294 x10E3/uL (ref 150–450)
RBC: 4.85 x10E6/uL (ref 3.77–5.28)
RDW: 12.9 % (ref 11.7–15.4)
WBC: 6.5 x10E3/uL (ref 3.4–10.8)

## 2024-03-10 LAB — LIPID PANEL
Chol/HDL Ratio: 3.2 ratio (ref 0.0–4.4)
Cholesterol, Total: 168 mg/dL (ref 100–199)
HDL: 53 mg/dL
LDL Chol Calc (NIH): 87 mg/dL (ref 0–99)
Triglycerides: 164 mg/dL — ABNORMAL HIGH (ref 0–149)
VLDL Cholesterol Cal: 28 mg/dL (ref 5–40)

## 2024-03-10 LAB — CMP14+EGFR
ALT: 17 IU/L (ref 0–32)
AST: 19 IU/L (ref 0–40)
Albumin: 4 g/dL (ref 3.9–4.9)
Alkaline Phosphatase: 69 IU/L (ref 49–135)
BUN/Creatinine Ratio: 19 (ref 12–28)
BUN: 19 mg/dL (ref 8–27)
Bilirubin Total: 0.5 mg/dL (ref 0.0–1.2)
CO2: 24 mmol/L (ref 20–29)
Calcium: 9.9 mg/dL (ref 8.7–10.3)
Chloride: 101 mmol/L (ref 96–106)
Creatinine, Ser: 0.98 mg/dL (ref 0.57–1.00)
Globulin, Total: 2.4 g/dL (ref 1.5–4.5)
Glucose: 91 mg/dL (ref 70–99)
Potassium: 4.3 mmol/L (ref 3.5–5.2)
Sodium: 139 mmol/L (ref 134–144)
Total Protein: 6.4 g/dL (ref 6.0–8.5)
eGFR: 64 mL/min/1.73

## 2024-03-10 LAB — BAYER DCA HB A1C WAIVED: HB A1C (BAYER DCA - WAIVED): 5.4 % (ref 4.8–5.6)

## 2024-03-10 MED ORDER — PANTOPRAZOLE SODIUM 20 MG PO TBEC: 20.0000 mg | DELAYED_RELEASE_TABLET | Freq: Two times a day (BID) | ORAL | 1 refills | Status: AC

## 2024-03-10 MED ORDER — ROSUVASTATIN CALCIUM 40 MG PO TABS: 40.0000 mg | ORAL_TABLET | Freq: Every day | ORAL | 1 refills | Status: AC

## 2024-03-10 MED ORDER — FAMOTIDINE 20 MG PO TABS: 20.0000 mg | ORAL_TABLET | Freq: Two times a day (BID) | ORAL | 1 refills | Status: AC

## 2024-03-10 MED ORDER — OZEMPIC (1 MG/DOSE) 4 MG/3ML ~~LOC~~ SOPN: 1.0000 mg | PEN_INJECTOR | SUBCUTANEOUS | 1 refills | Status: AC

## 2024-03-10 MED ORDER — ESCITALOPRAM OXALATE 20 MG PO TABS: 20.0000 mg | ORAL_TABLET | Freq: Every day | ORAL | 1 refills | Status: AC

## 2024-03-10 MED ORDER — LINACLOTIDE 290 MCG PO CAPS: 290.0000 ug | ORAL_CAPSULE | Freq: Every day | ORAL | 5 refills | Status: AC

## 2024-03-10 NOTE — Progress Notes (Signed)
 "  Subjective:    Patient ID: Crystal Wong, female    DOB: 04-29-1955, 69 y.o.   MRN: [id number]   Chief Complaint: medical management of chronic issues     HPI:  Crystal Wong is a 69 y.o. who identifies as a female who was assigned female at birth.   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 Mixed hyperlipidemia Does try to watch diet. Does no dedicated exercise Lab Results  Component Value Date   CHOL 170 09/11/2023   HDL 63 09/11/2023   LDLCALC 93 09/11/2023   TRIG 76 09/11/2023   CHOLHDL 2.7 09/11/2023   The 10-year ASCVD risk score (Arnett DK, et al., 2019) is: 9%   2. Diabetes mellitus without complication (HCC) Fasting blood sugars are running around 100-130. Lab Results  Component Value Date   HGBA1C 5.4 09/11/2023     3. Long-term current use of injectable noninsulin antidiabetic medication Is on ozempic  weekly. She does not check her blood sugars very often  4. Gastroesophageal reflux disease with esophagitis without hemorrhage Takes protonix  daily. Will still have occasional symptoms  5. Cough variant asthma Chronic. Is on symbicort  daily. Denies SOB now. Had covid several months ago and had lots of SOB. Had flu last week and still has cough  6. Slow transit constipation Is on linzess  which has really helped  7. Recurrent major depressive disorder, in full remission (HCC) Is on celexa  daily and is doing well.    03/10/2024   10:53 AM 01/21/2024    8:18 AM 01/17/2024   10:15 AM  Depression screen PHQ 2/9  Decreased Interest 0 0 0  Down, Depressed, Hopeless 0 0 0  PHQ - 2 Score 0 0 0  Altered sleeping  1   Tired, decreased energy  0   Change in appetite  0   Feeling bad or failure about yourself   0   Trouble concentrating  0   Moving slowly or fidgety/restless  0   Suicidal thoughts  0   PHQ-9 Score  1   Difficult doing work/chores  Not  difficult at all     8. GAD (generalized anxiety disorder) Is on ativan  daily    03/10/2024   10:53 AM 01/21/2024    8:18 AM 10/16/2023    9:16 AM 10/09/2023   11:43 AM  GAD 7 : Generalized Anxiety Score  Nervous, Anxious, on Edge 0 0 0 0  Control/stop worrying 0 0 0 0  Worry too much - different things 0 0 0 0  Trouble relaxing 0 0 0 0  Restless 0 0 0 0  Easily annoyed or irritable 0 0 0 0  Afraid - awful might happen 0 0 0 0  Total GAD 7 Score 0 0 0 0  Anxiety Difficulty Not difficult at all Not difficult at all Not difficult at all Not difficult at all      9. Primary insomnia Sleeps well if she waits and takes her xanax at night  10. BMI 33.0-33.9,adult Weight is down 6lbs Wt Readings from Last 3 Encounters:  03/10/24 142 lb (64.4 kg)  01/21/24 148 lb (67.1 kg)  01/17/24 155 lb (70.3 kg)   BMI Readings from Last 3 Encounters:  03/10/24 22.92 kg/m  01/21/24 23.89 kg/m  01/17/24 25.02 kg/m      New complaints: None today  Allergies  Allergen Reactions  Sulfa Antibiotics Anaphylaxis   Sulfamethoxazole-Trimethoprim Swelling   Elemental Sulfur Other (See Comments)    Other reaction(s): Angioedema (ALLERGY/intolerance) Other reaction(s): Angioedema (ALLERGY/intolerance)   Outpatient Encounter Medications as of 03/10/2024  Medication Sig   albuterol  (VENTOLIN  HFA) 108 (90 Base) MCG/ACT inhaler 2 puff q6 prn   benzonatate  (TESSALON ) 100 MG capsule Take 1-2 capsules (100-200 mg total) by mouth 3 (three) times daily as needed.   budesonide -formoterol  (SYMBICORT ) 80-4.5 MCG/ACT inhaler Take 2 puffs first thing in am and then another 2 puffs about 12 hours later.   celecoxib (CELEBREX) 100 MG capsule Take 100 mg by mouth daily.   dutasteride (AVODART) 0.5 MG capsule Take 0.5 mg by mouth daily.   escitalopram  (LEXAPRO ) 20 MG tablet Take 1 tablet (20 mg total) by mouth daily.   Evolocumab  (REPATHA  SURECLICK) 140 MG/ML SOAJ INJECT 140 MG INTO THE SKIN EVERY 14  (FOURTEEN) DAYS.   famotidine  (PEPCID ) 20 MG tablet Take 1 tablet (20 mg total) by mouth 2 (two) times daily.   fluticasone  (FLONASE ) 50 MCG/ACT nasal spray Place 2 sprays into both nostrils daily.   hydrocortisone  (ANUSOL -HC) 2.5 % rectal cream Place 1 Application rectally 2 (two) times daily.   hydrocortisone  (ANUSOL -HC) 25 MG suppository Place 1 suppository (25 mg total) rectally 2 (two) times daily.   hyoscyamine  (LEVSIN  SL) 0.125 MG SL tablet TAKE 1 TABLET (0.125 MG TOTAL) BY MOUTH EVERY 6 (SIX) HOURS AS NEEDED.   linaclotide  (LINZESS ) 290 MCG CAPS capsule Take 1 capsule (290 mcg total) by mouth daily before breakfast.   LORATADINE -D 24HR 10-240 MG 24 hr tablet Take 1 tablet by mouth once daily   naproxen  (NAPROSYN ) 500 MG tablet Take 1 tablet (500 mg total) by mouth 2 (two) times daily with a meal.   ondansetron  (ZOFRAN -ODT) 4 MG disintegrating tablet Take 1 tablet (4 mg total) by mouth every 8 (eight) hours as needed.   oseltamivir  (TAMIFLU ) 75 MG capsule Take 1 capsule (75 mg total) by mouth 2 (two) times daily.   pantoprazole  (PROTONIX ) 20 MG tablet Take 1 tablet (20 mg total) by mouth 2 (two) times daily.   predniSONE  (DELTASONE ) 20 MG tablet Take 2 tablets (40 mg total) by mouth daily with breakfast.   promethazine -dextromethorphan (PROMETHAZINE -DM) 6.25-15 MG/5ML syrup Take 5 mLs by mouth 4 (four) times daily as needed.   rosuvastatin  (CRESTOR ) 40 MG tablet Take 1 tablet (40 mg total) by mouth daily.   Semaglutide , 1 MG/DOSE, (OZEMPIC , 1 MG/DOSE,) 4 MG/3ML SOPN Inject 1 mg into the skin once a week.   VITAMIN D , CHOLECALCIFEROL, PO Take 1 tablet by mouth daily.    No facility-administered encounter medications on file as of 03/10/2024.    Past Surgical History:  Procedure Laterality Date   71 HOUR PH STUDY N/A 12/27/2022   Procedure: 24 HOUR PH STUDY;  Surgeon: Charlanne Groom, MD;  Location: [facility];  Service: Gastroenterology;  Laterality: N/A;  with impectence   ABDOMINAL  HYSTERECTOMY     ANKLE FRACTURE SURGERY Left    Pins and screws   BREAST LUMPECTOMY Bilateral    left x 1, right x 2   CARPAL TUNNEL RELEASE Right    CARPAL TUNNEL RELEASE Left    COLONOSCOPY     ESOPHAGEAL MANOMETRY N/A 12/27/2022   Procedure: ESOPHAGEAL MANOMETRY (EM);  Surgeon: Charlanne Groom, MD;  Location: [facility];  Service: Gastroenterology;  Laterality: N/A;   FRACTURE SURGERY     KNEE ARTHROSCOPY Bilateral    PARTIAL HYSTERECTOMY  Family History  Problem Relation Age of Onset   Heart disease Mother    Diabetes Mother    Colon polyps Mother    Anxiety disorder Mother    Depression Mother    Colon cancer Father 42   Arthritis Father    Cancer Father    Gallbladder disease Daughter    ADD / ADHD Daughter    Gallstones Paternal Grandmother    Esophageal cancer Neg Hx    Stomach cancer Neg Hx    Rectal cancer Neg Hx    Breast cancer Neg Hx       Controlled substance contract: 03/08/22     Review of Systems  Constitutional:  Negative for diaphoresis.  Eyes:  Negative for pain.  Respiratory:  Negative for shortness of breath.   Cardiovascular:  Negative for chest pain, palpitations and leg swelling.  Gastrointestinal:  Negative for abdominal pain.  Endocrine: Negative for polydipsia.  Skin:  Negative for rash.  Neurological:  Negative for dizziness, weakness and headaches.  Hematological:  Does not bruise/bleed easily.  All other systems reviewed and are negative.      Objective:   Physical Exam Vitals and nursing note reviewed.  Constitutional:      General: She is not in acute distress.    Appearance: Normal appearance. She is well-developed.  HENT:     Head: Normocephalic.     Right Ear: Tympanic membrane normal.     Left Ear: Tympanic membrane normal.     Nose: Nose normal.     Mouth/Throat:     Mouth: Mucous membranes are moist.     Comments: White film on tongue Eyes:     Pupils: Pupils are equal, round, and reactive to light.  Neck:      Vascular: No carotid bruit or JVD.  Cardiovascular:     Rate and Rhythm: Normal rate and regular rhythm.     Heart sounds: Murmur (2/6) heard.  Pulmonary:     Effort: Pulmonary effort is normal. No respiratory distress.     Breath sounds: Normal breath sounds. No wheezing or rales.  Chest:     Chest wall: No tenderness.  Abdominal:     General: Bowel sounds are normal. There is no distension or abdominal bruit.     Palpations: Abdomen is soft. There is no hepatomegaly, splenomegaly, mass or pulsatile mass.     Tenderness: There is no abdominal tenderness.  Musculoskeletal:        General: Normal range of motion.     Cervical back: Normal range of motion and neck supple.  Lymphadenopathy:     Cervical: No cervical adenopathy.  Skin:    General: Skin is warm and dry.  Neurological:     Mental Status: She is alert and oriented to person, place, and time.     Deep Tendon Reflexes: Reflexes are normal and symmetric.  Psychiatric:        Behavior: Behavior normal.        Thought Content: Thought content normal.        Judgment: Judgment normal.   BP 118/79   Pulse 90   Temp 97.9 F (36.6 C) (Temporal)   Ht 5' 6 (1.676 m)   Wt 142 lb (64.4 kg)   SpO2 100%   BMI 22.92 kg/m      HGBA1c 5.4      Assessment & Plan:   Crystal Wong comes in today with chief complaint of medical management of chronic issues  Diagnosis and orders addressed:  1. Mixed hyperlipidemia Low fat diet - Lipid panel - rosuvastatin  (CRESTOR ) 40 MG tablet; Take 1 tablet (40 mg total) by mouth daily.  Dispense: 90 tablet; Refill: 1  2. Diabetes mellitus without complication (HCC) Continue to watch carbs in diet Will attempt to get approval for ozempic  since insurance has changed - Bayer DCA Hb A1c Waived - CBC with Differential/Platelet - CMP14+EGFR - Microalbumin / creatinine urine ratio - Semaglutide ,0.25 or 0.5MG /DOS, 2 MG/3ML SOPN; Inject 0.5 mg into the skin once a week.  Dispense:  3 mL; Refill: 2  3. Long-term current use of injectable noninsulin antidiabetic medication  4. Gastroesophageal reflux disease with esophagitis without hemorrhage Avoid spicy foods Do not eat 2 hours prior to bedtime - famotidine  (PEPCID ) 20 MG tablet; Take 1 tablet (20 mg total) by mouth 2 (two) times daily.  Dispense: 180 tablet; Refill: 1 - lansoprazole  (PREVACID ) 30 MG capsule; Take 1 capsule (30 mg total) by mouth 2 (two) times daily before a meal.  Dispense: 60 capsule; Refill: 2 - dexlansoprazole  (DEXILANT ) 60 MG capsule; Take 1 capsule (60 mg total) by mouth daily.  Dispense: 90 capsule; Refill: 1  5. Cough variant asthma - budesonide -formoterol  (SYMBICORT ) 80-4.5 MCG/ACT inhaler; Take 2 puffs first thing in am and then another 2 puffs about 12 hours later.  Dispense: 1 each; Refill: 12  6. Slow transit constipation Continue linzess  as prescribed  7. Recurrent major depressive disorder, in full remission (HCC) Stress management - citalopram  (CELEXA ) 40 MG tablet; Take 1 tablet (40 mg total) by mouth daily.  Dispense: 90 tablet; Refill: 1  8. GAD (generalized anxiety disorder) - LORazepam  (ATIVAN ) 1 MG tablet; Take 1 tablet (1 mg total) by mouth every 8 (eight) hours as needed.  Dispense: 30 tablet; Refill: 5  9. Primary insomnia Bedtime routine  10. BMI 33.0-33.9,adult Discussed diet and exercise for person with BMI >25 Will recheck weight in 3-6 months    Labs pending Health Maintenance reviewed Diet and exercise encouraged  Follow up plan: 6 month   Mary-Margaret Gladis, FNP  "

## 2024-03-10 NOTE — Patient Instructions (Signed)

## 2024-03-11 ENCOUNTER — Ambulatory Visit: Payer: Self-pay | Admitting: Nurse Practitioner

## 2024-03-11 ENCOUNTER — Ambulatory Visit: Admitting: Nurse Practitioner

## 2024-03-11 LAB — MICROALBUMIN / CREATININE URINE RATIO
Creatinine, Urine: 133.1 mg/dL
Microalb/Creat Ratio: 17 mg/g{creat} (ref 0–29)
Microalbumin, Urine: 22.3 ug/mL

## 2024-03-19 ENCOUNTER — Ambulatory Visit: Payer: Self-pay

## 2024-03-19 NOTE — Telephone Encounter (Signed)
 Left message for patient to call back

## 2024-03-19 NOTE — Telephone Encounter (Signed)
" °  Reason for Disposition  General information question, no triage required and triager able to answer question  Answer Assessment - Initial Assessment Questions 1. REASON FOR CALL: What is the main reason for your call? or How can I best help you?  Pt states forgot to bleed her ozempic  pen prior to first injection. Was worried about having possibly injected a small amount of air. Advised patient that provider would be made aware, but likely no action or concern needed.  Protocols used: Information Only Call - No Triage-A-AH  "

## 2024-03-19 NOTE — Telephone Encounter (Signed)
 Copied from CRM [id number]. Topic: Clinical - Medical Advice >> Mar 19, 2024  1:41 PM Susanna ORN wrote: Reason for CRM: Patient states that she just took an ozempic  injection for the first time & forgot to bleed it. Requesting to speak with a nurse.

## 2024-03-20 NOTE — Telephone Encounter (Signed)
 FYI call.  I spoke to patient she was reassured by triage nurse that there should not be any concern.

## 2024-04-06 ENCOUNTER — Other Ambulatory Visit: Payer: Self-pay | Admitting: Nurse Practitioner

## 2024-04-07 ENCOUNTER — Telehealth: Payer: Self-pay | Admitting: Gastroenterology

## 2024-04-07 DIAGNOSIS — R109 Unspecified abdominal pain: Secondary | ICD-10-CM

## 2024-04-07 MED ORDER — HYOSCYAMINE SULFATE 0.125 MG SL SUBL: 0.1250 mg | SUBLINGUAL_TABLET | Freq: Four times a day (QID) | SUBLINGUAL | 0 refills | Status: AC | PRN

## 2024-04-07 NOTE — Telephone Encounter (Signed)
 Patient called on call service for refill of hyoscyamine .  She stated she will call back when officers are open to make an appointment as she is due for 1 in March.  Refilled hyoscyamine .  Patient had no further questions and thanked me for the call.  Advised of hyoscyamine  side effects

## 2024-05-20 ENCOUNTER — Ambulatory Visit: Admitting: Gastroenterology

## 2024-06-30 ENCOUNTER — Other Ambulatory Visit (HOSPITAL_COMMUNITY)

## 2024-09-09 ENCOUNTER — Ambulatory Visit: Admitting: Nurse Practitioner

## 2025-01-19 ENCOUNTER — Ambulatory Visit
